# Patient Record
Sex: Female | Born: 1940
Health system: Southern US, Community
[De-identification: ages and names within clinical notes are randomized; demographics above are authoritative.]

## PROBLEM LIST (undated history)

## (undated) ENCOUNTER — Ambulatory Visit: Admission: EM | Payer: Medicare Other | Source: Home / Self Care

## (undated) DIAGNOSIS — K5792 Diverticulitis of intestine, part unspecified, without perforation or abscess without bleeding: Secondary | ICD-10-CM

## (undated) DIAGNOSIS — K6289 Other specified diseases of anus and rectum: Secondary | ICD-10-CM

## (undated) DIAGNOSIS — K559 Vascular disorder of intestine, unspecified: Secondary | ICD-10-CM

## (undated) DIAGNOSIS — K760 Fatty (change of) liver, not elsewhere classified: Secondary | ICD-10-CM

## (undated) DIAGNOSIS — Z9889 Other specified postprocedural states: Secondary | ICD-10-CM

## (undated) DIAGNOSIS — R159 Full incontinence of feces: Secondary | ICD-10-CM

## (undated) DIAGNOSIS — R002 Palpitations: Secondary | ICD-10-CM

## (undated) DIAGNOSIS — T8859XA Other complications of anesthesia, initial encounter: Secondary | ICD-10-CM

## (undated) DIAGNOSIS — R112 Nausea with vomiting, unspecified: Secondary | ICD-10-CM

## (undated) DIAGNOSIS — R55 Syncope and collapse: Secondary | ICD-10-CM

## (undated) DIAGNOSIS — K219 Gastro-esophageal reflux disease without esophagitis: Secondary | ICD-10-CM

## (undated) DIAGNOSIS — E213 Hyperparathyroidism, unspecified: Secondary | ICD-10-CM

## (undated) DIAGNOSIS — I495 Sick sinus syndrome: Secondary | ICD-10-CM

## (undated) DIAGNOSIS — N816 Rectocele: Secondary | ICD-10-CM

## (undated) DIAGNOSIS — R1013 Epigastric pain: Secondary | ICD-10-CM

## (undated) DIAGNOSIS — E785 Hyperlipidemia, unspecified: Secondary | ICD-10-CM

## (undated) DIAGNOSIS — K589 Irritable bowel syndrome without diarrhea: Secondary | ICD-10-CM

## (undated) DIAGNOSIS — I341 Nonrheumatic mitral (valve) prolapse: Secondary | ICD-10-CM

## (undated) DIAGNOSIS — R079 Chest pain, unspecified: Secondary | ICD-10-CM

## (undated) DIAGNOSIS — M8580 Other specified disorders of bone density and structure, unspecified site: Secondary | ICD-10-CM

## (undated) DIAGNOSIS — K5730 Diverticulosis of large intestine without perforation or abscess without bleeding: Secondary | ICD-10-CM

## (undated) DIAGNOSIS — R011 Cardiac murmur, unspecified: Secondary | ICD-10-CM

## (undated) DIAGNOSIS — K5901 Slow transit constipation: Secondary | ICD-10-CM

## (undated) DIAGNOSIS — M1990 Unspecified osteoarthritis, unspecified site: Secondary | ICD-10-CM

## (undated) DIAGNOSIS — I10 Essential (primary) hypertension: Secondary | ICD-10-CM

## (undated) DIAGNOSIS — K635 Polyp of colon: Secondary | ICD-10-CM

## (undated) DIAGNOSIS — C801 Malignant (primary) neoplasm, unspecified: Secondary | ICD-10-CM

## (undated) HISTORY — DX: Diverticulitis of intestine, part unspecified, without perforation or abscess without bleeding: K57.92

## (undated) HISTORY — DX: Gastro-esophageal reflux disease without esophagitis: K21.9

## (undated) HISTORY — DX: Essential (primary) hypertension: I10

## (undated) HISTORY — PX: OTHER SURGICAL HISTORY: SHX169

## (undated) HISTORY — DX: Slow transit constipation: K59.01

## (undated) HISTORY — DX: Irritable bowel syndrome, unspecified: K58.9

## (undated) HISTORY — DX: Epigastric pain: R10.13

## (undated) HISTORY — DX: Unspecified osteoarthritis, unspecified site: M19.90

## (undated) HISTORY — DX: Hyperlipidemia, unspecified: E78.5

## (undated) HISTORY — DX: Cardiac murmur, unspecified: R01.1

## (undated) HISTORY — PX: TONSILLECTOMY: SUR1361

## (undated) HISTORY — DX: Fatty (change of) liver, not elsewhere classified: K76.0

## (undated) HISTORY — PX: BLADDER SUSPENSION: SHX72

## (undated) HISTORY — DX: Full incontinence of feces: R15.9

## (undated) HISTORY — DX: Polyp of colon: K63.5

## (undated) HISTORY — DX: Diverticulosis of large intestine without perforation or abscess without bleeding: K57.30

## (undated) HISTORY — DX: Palpitations: R00.2

## (undated) HISTORY — PX: ABDOMINAL HYSTERECTOMY: SHX81

## (undated) HISTORY — DX: Rectocele: N81.6

## (undated) HISTORY — PX: BUNIONECTOMY: SHX129

## (undated) HISTORY — DX: Other specified disorders of bone density and structure, unspecified site: M85.80

## (undated) HISTORY — PX: CARDIAC CATHETERIZATION: SHX172

## (undated) HISTORY — DX: Syncope and collapse: R55

## (undated) HISTORY — DX: Nonrheumatic mitral (valve) prolapse: I34.1

## (undated) HISTORY — DX: Chest pain, unspecified: R07.9

## (undated) HISTORY — DX: Vascular disorder of intestine, unspecified: K55.9

## (undated) HISTORY — DX: Other specified diseases of anus and rectum: K62.89

## (undated) HISTORY — DX: Sick sinus syndrome: I49.5

## (undated) HISTORY — PX: COLONOSCOPY: SHX5424

## (undated) HISTORY — PX: CATARACT EXTRACTION: SUR2

---

## 1993-07-29 DIAGNOSIS — K635 Polyp of colon: Secondary | ICD-10-CM

## 1993-07-29 HISTORY — DX: Polyp of colon: K63.5

## 1998-07-22 ENCOUNTER — Other Ambulatory Visit: Admission: RE | Admit: 1998-07-22 | Discharge: 1998-07-22 | Payer: Self-pay | Admitting: Gynecology

## 1999-05-04 HISTORY — PX: ABDOMINAL SURGERY: SHX537

## 1999-06-11 ENCOUNTER — Encounter: Payer: Self-pay | Admitting: Gastroenterology

## 1999-06-11 ENCOUNTER — Ambulatory Visit (HOSPITAL_COMMUNITY): Admission: RE | Admit: 1999-06-11 | Discharge: 1999-06-11 | Payer: Self-pay | Admitting: Gastroenterology

## 1999-06-23 ENCOUNTER — Ambulatory Visit (HOSPITAL_COMMUNITY): Admission: RE | Admit: 1999-06-23 | Discharge: 1999-06-23 | Payer: Self-pay | Admitting: Gastroenterology

## 1999-06-23 ENCOUNTER — Encounter: Payer: Self-pay | Admitting: Gastroenterology

## 1999-07-15 ENCOUNTER — Inpatient Hospital Stay (HOSPITAL_COMMUNITY): Admission: RE | Admit: 1999-07-15 | Discharge: 1999-07-19 | Payer: Self-pay | Admitting: General Surgery

## 1999-07-29 ENCOUNTER — Ambulatory Visit (HOSPITAL_COMMUNITY): Admission: RE | Admit: 1999-07-29 | Discharge: 1999-07-29 | Payer: Self-pay | Admitting: Oncology

## 1999-07-29 ENCOUNTER — Encounter: Payer: Self-pay | Admitting: Oncology

## 1999-08-17 ENCOUNTER — Encounter: Payer: Self-pay | Admitting: Oncology

## 1999-08-17 ENCOUNTER — Ambulatory Visit (HOSPITAL_COMMUNITY): Admission: RE | Admit: 1999-08-17 | Discharge: 1999-08-17 | Payer: Self-pay | Admitting: Oncology

## 1999-11-24 ENCOUNTER — Ambulatory Visit (HOSPITAL_BASED_OUTPATIENT_CLINIC_OR_DEPARTMENT_OTHER): Admission: RE | Admit: 1999-11-24 | Discharge: 1999-11-24 | Payer: Self-pay | Admitting: General Surgery

## 1999-12-02 ENCOUNTER — Encounter: Payer: Self-pay | Admitting: General Surgery

## 1999-12-02 ENCOUNTER — Ambulatory Visit (HOSPITAL_COMMUNITY): Admission: RE | Admit: 1999-12-02 | Discharge: 1999-12-02 | Payer: Self-pay | Admitting: General Surgery

## 1999-12-08 ENCOUNTER — Encounter: Payer: Self-pay | Admitting: Oncology

## 1999-12-08 ENCOUNTER — Encounter: Admission: RE | Admit: 1999-12-08 | Discharge: 1999-12-08 | Payer: Self-pay | Admitting: Oncology

## 1999-12-11 ENCOUNTER — Encounter: Payer: Self-pay | Admitting: Surgery

## 1999-12-11 ENCOUNTER — Ambulatory Visit (HOSPITAL_COMMUNITY): Admission: RE | Admit: 1999-12-11 | Discharge: 1999-12-11 | Payer: Self-pay | Admitting: Surgery

## 1999-12-21 ENCOUNTER — Encounter: Payer: Self-pay | Admitting: Oncology

## 1999-12-21 ENCOUNTER — Ambulatory Visit (HOSPITAL_COMMUNITY): Admission: RE | Admit: 1999-12-21 | Discharge: 1999-12-21 | Payer: Self-pay | Admitting: Oncology

## 2000-02-19 ENCOUNTER — Encounter: Payer: Self-pay | Admitting: Oncology

## 2000-02-19 ENCOUNTER — Ambulatory Visit (HOSPITAL_COMMUNITY): Admission: RE | Admit: 2000-02-19 | Discharge: 2000-02-19 | Payer: Self-pay | Admitting: Oncology

## 2000-02-20 ENCOUNTER — Ambulatory Visit (HOSPITAL_COMMUNITY): Admission: RE | Admit: 2000-02-20 | Discharge: 2000-02-20 | Payer: Self-pay | Admitting: Oncology

## 2000-02-20 ENCOUNTER — Encounter: Payer: Self-pay | Admitting: Oncology

## 2000-03-09 ENCOUNTER — Encounter: Payer: Self-pay | Admitting: Oncology

## 2000-03-09 ENCOUNTER — Ambulatory Visit (HOSPITAL_COMMUNITY): Admission: RE | Admit: 2000-03-09 | Discharge: 2000-03-09 | Payer: Self-pay | Admitting: Oncology

## 2000-05-12 ENCOUNTER — Other Ambulatory Visit: Admission: RE | Admit: 2000-05-12 | Discharge: 2000-05-12 | Payer: Self-pay | Admitting: Gynecology

## 2000-05-23 ENCOUNTER — Ambulatory Visit (HOSPITAL_BASED_OUTPATIENT_CLINIC_OR_DEPARTMENT_OTHER): Admission: RE | Admit: 2000-05-23 | Discharge: 2000-05-23 | Payer: Self-pay | Admitting: General Surgery

## 2000-06-27 ENCOUNTER — Encounter: Payer: Self-pay | Admitting: Oncology

## 2000-06-27 ENCOUNTER — Ambulatory Visit (HOSPITAL_COMMUNITY): Admission: RE | Admit: 2000-06-27 | Discharge: 2000-06-27 | Payer: Self-pay | Admitting: Oncology

## 2000-08-24 ENCOUNTER — Encounter: Payer: Self-pay | Admitting: Oncology

## 2000-08-24 ENCOUNTER — Ambulatory Visit (HOSPITAL_COMMUNITY): Admission: RE | Admit: 2000-08-24 | Discharge: 2000-08-24 | Payer: Self-pay | Admitting: Oncology

## 2000-08-25 ENCOUNTER — Encounter: Payer: Self-pay | Admitting: Oncology

## 2000-08-25 ENCOUNTER — Encounter: Admission: RE | Admit: 2000-08-25 | Discharge: 2000-08-25 | Payer: Self-pay | Admitting: Oncology

## 2000-11-29 ENCOUNTER — Encounter: Payer: Self-pay | Admitting: Oncology

## 2000-11-29 ENCOUNTER — Ambulatory Visit (HOSPITAL_COMMUNITY): Admission: RE | Admit: 2000-11-29 | Discharge: 2000-11-29 | Payer: Self-pay | Admitting: Oncology

## 2001-02-27 ENCOUNTER — Ambulatory Visit (HOSPITAL_COMMUNITY): Admission: RE | Admit: 2001-02-27 | Discharge: 2001-02-27 | Payer: Self-pay | Admitting: Oncology

## 2001-02-27 ENCOUNTER — Encounter: Payer: Self-pay | Admitting: Oncology

## 2001-03-20 ENCOUNTER — Other Ambulatory Visit: Admission: RE | Admit: 2001-03-20 | Discharge: 2001-03-20 | Payer: Self-pay | Admitting: Dermatology

## 2001-05-03 HISTORY — PX: CHOLECYSTECTOMY: SHX55

## 2001-05-09 ENCOUNTER — Encounter: Payer: Self-pay | Admitting: Gastroenterology

## 2001-05-09 ENCOUNTER — Ambulatory Visit (HOSPITAL_COMMUNITY): Admission: RE | Admit: 2001-05-09 | Discharge: 2001-05-09 | Payer: Self-pay | Admitting: Gastroenterology

## 2001-05-22 ENCOUNTER — Emergency Department (HOSPITAL_COMMUNITY): Admission: EM | Admit: 2001-05-22 | Discharge: 2001-05-22 | Payer: Self-pay | Admitting: *Deleted

## 2001-05-22 ENCOUNTER — Encounter: Payer: Self-pay | Admitting: *Deleted

## 2001-05-23 ENCOUNTER — Encounter (INDEPENDENT_AMBULATORY_CARE_PROVIDER_SITE_OTHER): Payer: Self-pay | Admitting: *Deleted

## 2001-05-23 ENCOUNTER — Ambulatory Visit (HOSPITAL_COMMUNITY): Admission: RE | Admit: 2001-05-23 | Discharge: 2001-05-24 | Payer: Self-pay | Admitting: General Surgery

## 2001-07-03 ENCOUNTER — Ambulatory Visit (HOSPITAL_COMMUNITY): Admission: RE | Admit: 2001-07-03 | Discharge: 2001-07-03 | Payer: Self-pay | Admitting: Oncology

## 2001-07-03 ENCOUNTER — Encounter: Payer: Self-pay | Admitting: Oncology

## 2001-08-31 ENCOUNTER — Encounter: Payer: Self-pay | Admitting: Oncology

## 2001-08-31 ENCOUNTER — Encounter: Admission: RE | Admit: 2001-08-31 | Discharge: 2001-08-31 | Payer: Self-pay | Admitting: Oncology

## 2001-09-01 ENCOUNTER — Ambulatory Visit (HOSPITAL_COMMUNITY): Admission: RE | Admit: 2001-09-01 | Discharge: 2001-09-01 | Payer: Self-pay | Admitting: Gastroenterology

## 2001-09-01 ENCOUNTER — Encounter: Payer: Self-pay | Admitting: Gastroenterology

## 2001-09-07 ENCOUNTER — Other Ambulatory Visit: Admission: RE | Admit: 2001-09-07 | Discharge: 2001-09-07 | Payer: Self-pay | Admitting: Obstetrics and Gynecology

## 2001-10-24 ENCOUNTER — Encounter: Payer: Self-pay | Admitting: Oncology

## 2001-10-24 ENCOUNTER — Ambulatory Visit (HOSPITAL_COMMUNITY): Admission: RE | Admit: 2001-10-24 | Discharge: 2001-10-24 | Payer: Self-pay | Admitting: Oncology

## 2002-02-06 ENCOUNTER — Ambulatory Visit (HOSPITAL_COMMUNITY): Admission: RE | Admit: 2002-02-06 | Discharge: 2002-02-06 | Payer: Self-pay | Admitting: Family Medicine

## 2002-02-06 ENCOUNTER — Encounter: Payer: Self-pay | Admitting: Family Medicine

## 2002-02-26 ENCOUNTER — Ambulatory Visit (HOSPITAL_COMMUNITY): Admission: RE | Admit: 2002-02-26 | Discharge: 2002-02-26 | Payer: Self-pay | Admitting: Oncology

## 2002-02-26 ENCOUNTER — Encounter: Payer: Self-pay | Admitting: Oncology

## 2002-05-18 ENCOUNTER — Ambulatory Visit (HOSPITAL_COMMUNITY): Admission: RE | Admit: 2002-05-18 | Discharge: 2002-05-18 | Payer: Self-pay | Admitting: Oncology

## 2002-05-18 ENCOUNTER — Encounter: Payer: Self-pay | Admitting: Oncology

## 2002-06-21 ENCOUNTER — Ambulatory Visit (HOSPITAL_COMMUNITY): Admission: RE | Admit: 2002-06-21 | Discharge: 2002-06-21 | Payer: Self-pay | Admitting: Oncology

## 2002-06-21 ENCOUNTER — Encounter: Payer: Self-pay | Admitting: Oncology

## 2002-09-04 ENCOUNTER — Ambulatory Visit (HOSPITAL_COMMUNITY): Admission: RE | Admit: 2002-09-04 | Discharge: 2002-09-04 | Payer: Self-pay | Admitting: Oncology

## 2002-09-04 ENCOUNTER — Encounter: Payer: Self-pay | Admitting: Oncology

## 2002-09-13 ENCOUNTER — Encounter: Admission: RE | Admit: 2002-09-13 | Discharge: 2002-09-13 | Payer: Self-pay | Admitting: Oncology

## 2002-09-13 ENCOUNTER — Encounter: Payer: Self-pay | Admitting: Oncology

## 2002-11-30 ENCOUNTER — Other Ambulatory Visit: Admission: RE | Admit: 2002-11-30 | Discharge: 2002-11-30 | Payer: Self-pay | Admitting: Obstetrics and Gynecology

## 2002-12-28 ENCOUNTER — Encounter: Payer: Self-pay | Admitting: Oncology

## 2002-12-28 ENCOUNTER — Ambulatory Visit (HOSPITAL_COMMUNITY): Admission: RE | Admit: 2002-12-28 | Discharge: 2002-12-28 | Payer: Self-pay | Admitting: Oncology

## 2003-01-24 ENCOUNTER — Ambulatory Visit (HOSPITAL_COMMUNITY): Admission: RE | Admit: 2003-01-24 | Discharge: 2003-01-24 | Payer: Self-pay | Admitting: Cardiology

## 2003-05-02 ENCOUNTER — Ambulatory Visit (HOSPITAL_COMMUNITY): Admission: RE | Admit: 2003-05-02 | Discharge: 2003-05-02 | Payer: Self-pay | Admitting: Oncology

## 2003-09-05 ENCOUNTER — Ambulatory Visit (HOSPITAL_COMMUNITY): Admission: RE | Admit: 2003-09-05 | Discharge: 2003-09-05 | Payer: Self-pay | Admitting: Oncology

## 2003-12-31 ENCOUNTER — Other Ambulatory Visit: Admission: RE | Admit: 2003-12-31 | Discharge: 2003-12-31 | Payer: Self-pay | Admitting: Obstetrics and Gynecology

## 2004-02-20 ENCOUNTER — Ambulatory Visit (HOSPITAL_COMMUNITY): Admission: RE | Admit: 2004-02-20 | Discharge: 2004-02-20 | Payer: Self-pay | Admitting: Oncology

## 2004-05-14 ENCOUNTER — Ambulatory Visit: Payer: Self-pay | Admitting: Cardiology

## 2004-06-17 ENCOUNTER — Ambulatory Visit: Payer: Self-pay | Admitting: Oncology

## 2004-06-19 ENCOUNTER — Ambulatory Visit (HOSPITAL_COMMUNITY): Admission: RE | Admit: 2004-06-19 | Discharge: 2004-06-19 | Payer: Self-pay | Admitting: Oncology

## 2004-11-11 ENCOUNTER — Ambulatory Visit: Payer: Self-pay | Admitting: Gastroenterology

## 2004-12-10 ENCOUNTER — Ambulatory Visit: Payer: Self-pay | Admitting: Oncology

## 2004-12-16 ENCOUNTER — Ambulatory Visit: Payer: Self-pay | Admitting: Gastroenterology

## 2004-12-24 ENCOUNTER — Ambulatory Visit (HOSPITAL_COMMUNITY): Admission: RE | Admit: 2004-12-24 | Discharge: 2004-12-24 | Payer: Self-pay | Admitting: Oncology

## 2005-02-10 ENCOUNTER — Ambulatory Visit: Payer: Self-pay | Admitting: Oncology

## 2005-03-01 ENCOUNTER — Other Ambulatory Visit: Admission: RE | Admit: 2005-03-01 | Discharge: 2005-03-01 | Payer: Self-pay | Admitting: Obstetrics and Gynecology

## 2005-03-19 ENCOUNTER — Encounter: Admission: RE | Admit: 2005-03-19 | Discharge: 2005-03-19 | Payer: Self-pay | Admitting: Obstetrics and Gynecology

## 2005-04-13 ENCOUNTER — Ambulatory Visit: Payer: Self-pay | Admitting: Oncology

## 2005-05-26 ENCOUNTER — Ambulatory Visit: Payer: Self-pay | Admitting: Gastroenterology

## 2005-06-09 ENCOUNTER — Ambulatory Visit: Payer: Self-pay | Admitting: Oncology

## 2005-06-23 ENCOUNTER — Ambulatory Visit: Payer: Self-pay | Admitting: Cardiology

## 2005-06-24 ENCOUNTER — Ambulatory Visit (HOSPITAL_COMMUNITY): Admission: RE | Admit: 2005-06-24 | Discharge: 2005-06-24 | Payer: Self-pay | Admitting: Oncology

## 2005-07-15 ENCOUNTER — Inpatient Hospital Stay (HOSPITAL_COMMUNITY): Admission: RE | Admit: 2005-07-15 | Discharge: 2005-07-17 | Payer: Self-pay | Admitting: Obstetrics and Gynecology

## 2005-10-31 HISTORY — PX: UPPER GASTROINTESTINAL ENDOSCOPY: SHX188

## 2005-11-24 ENCOUNTER — Ambulatory Visit: Payer: Self-pay | Admitting: Gastroenterology

## 2005-11-30 ENCOUNTER — Ambulatory Visit: Payer: Self-pay | Admitting: Gastroenterology

## 2006-01-04 ENCOUNTER — Ambulatory Visit: Payer: Self-pay | Admitting: Oncology

## 2006-01-06 LAB — COMPREHENSIVE METABOLIC PANEL
ALT: 23 U/L (ref 0–40)
AST: 25 U/L (ref 0–37)
Albumin: 4 g/dL (ref 3.5–5.2)
CO2: 28 mEq/L (ref 19–32)
Calcium: 10 mg/dL (ref 8.4–10.5)
Chloride: 105 mEq/L (ref 96–112)
Potassium: 4.2 mEq/L (ref 3.5–5.3)

## 2006-01-06 LAB — CBC WITH DIFFERENTIAL/PLATELET
BASO%: 0.5 % (ref 0.0–2.0)
Eosinophils Absolute: 0.1 10*3/uL (ref 0.0–0.5)
HCT: 36.9 % (ref 34.8–46.6)
LYMPH%: 26.8 % (ref 14.0–48.0)
MCHC: 34.8 g/dL (ref 32.0–36.0)
MONO#: 0.6 10*3/uL (ref 0.1–0.9)
NEUT#: 2.9 10*3/uL (ref 1.5–6.5)
NEUT%: 58.3 % (ref 39.6–76.8)
Platelets: 247 10*3/uL (ref 145–400)
RBC: 4.06 10*6/uL (ref 3.70–5.32)
WBC: 5 10*3/uL (ref 3.9–10.0)
lymph#: 1.4 10*3/uL (ref 0.9–3.3)

## 2006-01-06 LAB — ERYTHROCYTE SEDIMENTATION RATE: Sed Rate: 9 mm/hr (ref 0–30)

## 2006-01-06 LAB — BETA 2 MICROGLOBULIN, SERUM: Beta-2 Microglobulin: 1.68 mg/L (ref 1.01–1.73)

## 2006-01-07 ENCOUNTER — Ambulatory Visit (HOSPITAL_COMMUNITY): Admission: RE | Admit: 2006-01-07 | Discharge: 2006-01-07 | Payer: Self-pay | Admitting: Oncology

## 2006-02-13 ENCOUNTER — Emergency Department (HOSPITAL_COMMUNITY): Admission: EM | Admit: 2006-02-13 | Discharge: 2006-02-13 | Payer: Self-pay | Admitting: Emergency Medicine

## 2006-03-03 ENCOUNTER — Encounter: Admission: RE | Admit: 2006-03-03 | Discharge: 2006-03-03 | Payer: Self-pay | Admitting: Obstetrics and Gynecology

## 2006-06-08 ENCOUNTER — Ambulatory Visit: Payer: Self-pay | Admitting: Cardiology

## 2006-06-20 ENCOUNTER — Ambulatory Visit: Payer: Self-pay | Admitting: Oncology

## 2006-06-23 ENCOUNTER — Ambulatory Visit: Payer: Self-pay

## 2006-06-23 ENCOUNTER — Encounter: Payer: Self-pay | Admitting: Cardiology

## 2006-06-23 LAB — CBC WITH DIFFERENTIAL/PLATELET
Basophils Absolute: 0 10*3/uL (ref 0.0–0.1)
Eosinophils Absolute: 0.2 10*3/uL (ref 0.0–0.5)
HGB: 12.3 g/dL (ref 11.6–15.9)
MCV: 90.5 fL (ref 81.0–101.0)
MONO#: 0.7 10*3/uL (ref 0.1–0.9)
NEUT#: 3.8 10*3/uL (ref 1.5–6.5)
RDW: 13.4 % (ref 11.3–14.5)
WBC: 7.1 10*3/uL (ref 3.9–10.0)
lymph#: 2.3 10*3/uL (ref 0.9–3.3)

## 2006-06-27 LAB — PROTEIN ELECTROPHORESIS, SERUM
Albumin ELP: 60.5 % (ref 55.8–66.1)
Total Protein, Serum Electrophoresis: 6.6 g/dL (ref 6.0–8.3)

## 2006-06-27 LAB — COMPREHENSIVE METABOLIC PANEL
Albumin: 4.1 g/dL (ref 3.5–5.2)
BUN: 24 mg/dL — ABNORMAL HIGH (ref 6–23)
CO2: 27 mEq/L (ref 19–32)
Calcium: 10.1 mg/dL (ref 8.4–10.5)
Chloride: 106 mEq/L (ref 96–112)
Glucose, Bld: 103 mg/dL — ABNORMAL HIGH (ref 70–99)
Potassium: 4 mEq/L (ref 3.5–5.3)
Total Protein: 6.6 g/dL (ref 6.0–8.3)

## 2006-06-30 ENCOUNTER — Ambulatory Visit (HOSPITAL_COMMUNITY): Admission: RE | Admit: 2006-06-30 | Discharge: 2006-06-30 | Payer: Self-pay | Admitting: Oncology

## 2006-08-31 ENCOUNTER — Ambulatory Visit: Payer: Self-pay | Admitting: Gastroenterology

## 2006-09-15 ENCOUNTER — Ambulatory Visit (HOSPITAL_COMMUNITY): Admission: RE | Admit: 2006-09-15 | Discharge: 2006-09-15 | Payer: Self-pay | Admitting: Family Medicine

## 2007-01-11 ENCOUNTER — Ambulatory Visit: Payer: Self-pay | Admitting: Oncology

## 2007-01-12 LAB — CBC WITH DIFFERENTIAL/PLATELET
BASO%: 0.4 % (ref 0.0–2.0)
Eosinophils Absolute: 0.1 10*3/uL (ref 0.0–0.5)
LYMPH%: 28.5 % (ref 14.0–48.0)
MCHC: 35.5 g/dL (ref 32.0–36.0)
MCV: 89 fL (ref 81.0–101.0)
MONO#: 0.6 10*3/uL (ref 0.1–0.9)
MONO%: 11 % (ref 0.0–13.0)
NEUT#: 3.3 10*3/uL (ref 1.5–6.5)
RBC: 4.03 10*6/uL (ref 3.70–5.32)
RDW: 12.7 % (ref 11.3–14.5)
WBC: 5.6 10*3/uL (ref 3.9–10.0)

## 2007-01-12 LAB — LACTATE DEHYDROGENASE: LDH: 150 U/L (ref 94–250)

## 2007-01-12 LAB — COMPREHENSIVE METABOLIC PANEL
CO2: 26 mEq/L (ref 19–32)
Calcium: 9.8 mg/dL (ref 8.4–10.5)
Creatinine, Ser: 0.67 mg/dL (ref 0.40–1.20)
Glucose, Bld: 93 mg/dL (ref 70–99)
Sodium: 142 mEq/L (ref 135–145)
Total Bilirubin: 0.3 mg/dL (ref 0.3–1.2)
Total Protein: 6.6 g/dL (ref 6.0–8.3)

## 2007-01-13 ENCOUNTER — Ambulatory Visit (HOSPITAL_COMMUNITY): Admission: RE | Admit: 2007-01-13 | Discharge: 2007-01-13 | Payer: Self-pay | Admitting: Oncology

## 2007-03-17 ENCOUNTER — Ambulatory Visit: Payer: Self-pay | Admitting: Internal Medicine

## 2007-04-07 ENCOUNTER — Ambulatory Visit (HOSPITAL_COMMUNITY): Admission: RE | Admit: 2007-04-07 | Discharge: 2007-04-07 | Payer: Self-pay | Admitting: Family Medicine

## 2007-04-19 ENCOUNTER — Encounter: Admission: RE | Admit: 2007-04-19 | Discharge: 2007-04-19 | Payer: Self-pay | Admitting: Obstetrics and Gynecology

## 2007-06-14 ENCOUNTER — Encounter: Payer: Self-pay | Admitting: Cardiology

## 2007-06-16 DIAGNOSIS — K559 Vascular disorder of intestine, unspecified: Secondary | ICD-10-CM | POA: Insufficient documentation

## 2007-06-16 DIAGNOSIS — Z8719 Personal history of other diseases of the digestive system: Secondary | ICD-10-CM

## 2007-06-16 DIAGNOSIS — R159 Full incontinence of feces: Secondary | ICD-10-CM | POA: Insufficient documentation

## 2007-06-16 DIAGNOSIS — K5730 Diverticulosis of large intestine without perforation or abscess without bleeding: Secondary | ICD-10-CM | POA: Insufficient documentation

## 2007-06-16 DIAGNOSIS — M949 Disorder of cartilage, unspecified: Secondary | ICD-10-CM | POA: Insufficient documentation

## 2007-06-16 DIAGNOSIS — C8590 Non-Hodgkin lymphoma, unspecified, unspecified site: Secondary | ICD-10-CM | POA: Insufficient documentation

## 2007-06-16 DIAGNOSIS — K449 Diaphragmatic hernia without obstruction or gangrene: Secondary | ICD-10-CM | POA: Insufficient documentation

## 2007-06-16 DIAGNOSIS — M899 Disorder of bone, unspecified: Secondary | ICD-10-CM | POA: Insufficient documentation

## 2007-06-16 DIAGNOSIS — R55 Syncope and collapse: Secondary | ICD-10-CM | POA: Insufficient documentation

## 2007-06-21 ENCOUNTER — Ambulatory Visit: Payer: Self-pay | Admitting: Cardiology

## 2007-08-08 ENCOUNTER — Ambulatory Visit: Payer: Self-pay | Admitting: Oncology

## 2007-08-10 LAB — CBC WITH DIFFERENTIAL/PLATELET
BASO%: 0.2 % (ref 0.0–2.0)
Basophils Absolute: 0 10*3/uL (ref 0.0–0.1)
Eosinophils Absolute: 0.1 10*3/uL (ref 0.0–0.5)
HCT: 35.9 % (ref 34.8–46.6)
HGB: 12.6 g/dL (ref 11.6–15.9)
MONO#: 0.6 10*3/uL (ref 0.1–0.9)
NEUT#: 2.7 10*3/uL (ref 1.5–6.5)
NEUT%: 53.5 % (ref 39.6–76.8)
WBC: 5.1 10*3/uL (ref 3.9–10.0)
lymph#: 1.6 10*3/uL (ref 0.9–3.3)

## 2007-08-10 LAB — COMPREHENSIVE METABOLIC PANEL
ALT: 17 U/L (ref 0–35)
BUN: 17 mg/dL (ref 6–23)
CO2: 26 mEq/L (ref 19–32)
Calcium: 9.7 mg/dL (ref 8.4–10.5)
Chloride: 104 mEq/L (ref 96–112)
Creatinine, Ser: 0.72 mg/dL (ref 0.40–1.20)

## 2007-08-10 LAB — LACTATE DEHYDROGENASE: LDH: 137 U/L (ref 94–250)

## 2007-08-11 ENCOUNTER — Ambulatory Visit (HOSPITAL_COMMUNITY): Admission: RE | Admit: 2007-08-11 | Discharge: 2007-08-11 | Payer: Self-pay | Admitting: Oncology

## 2007-08-11 ENCOUNTER — Encounter: Payer: Self-pay | Admitting: Internal Medicine

## 2007-08-28 ENCOUNTER — Ambulatory Visit (HOSPITAL_COMMUNITY): Admission: RE | Admit: 2007-08-28 | Discharge: 2007-08-28 | Payer: Self-pay | Admitting: Oncology

## 2007-08-28 ENCOUNTER — Encounter: Payer: Self-pay | Admitting: Internal Medicine

## 2007-09-04 ENCOUNTER — Encounter: Payer: Self-pay | Admitting: Internal Medicine

## 2007-10-13 ENCOUNTER — Ambulatory Visit: Payer: Self-pay | Admitting: Internal Medicine

## 2007-10-13 DIAGNOSIS — K589 Irritable bowel syndrome without diarrhea: Secondary | ICD-10-CM

## 2007-10-13 DIAGNOSIS — K3189 Other diseases of stomach and duodenum: Secondary | ICD-10-CM | POA: Insufficient documentation

## 2007-10-13 DIAGNOSIS — R1013 Epigastric pain: Secondary | ICD-10-CM

## 2007-11-17 ENCOUNTER — Ambulatory Visit: Payer: Self-pay | Admitting: Internal Medicine

## 2007-11-17 ENCOUNTER — Encounter: Payer: Self-pay | Admitting: Internal Medicine

## 2007-11-17 ENCOUNTER — Telehealth: Payer: Self-pay | Admitting: Internal Medicine

## 2007-11-23 ENCOUNTER — Encounter: Payer: Self-pay | Admitting: Internal Medicine

## 2008-02-27 ENCOUNTER — Telehealth: Payer: Self-pay | Admitting: Internal Medicine

## 2008-05-16 ENCOUNTER — Encounter: Admission: RE | Admit: 2008-05-16 | Discharge: 2008-05-16 | Payer: Self-pay | Admitting: Oncology

## 2008-08-20 ENCOUNTER — Ambulatory Visit: Payer: Self-pay | Admitting: Oncology

## 2008-08-22 ENCOUNTER — Ambulatory Visit (HOSPITAL_COMMUNITY): Admission: RE | Admit: 2008-08-22 | Discharge: 2008-08-22 | Payer: Self-pay | Admitting: Oncology

## 2008-08-22 LAB — COMPREHENSIVE METABOLIC PANEL
Alkaline Phosphatase: 57 U/L (ref 39–117)
BUN: 13 mg/dL (ref 6–23)
Glucose, Bld: 106 mg/dL — ABNORMAL HIGH (ref 70–99)
Sodium: 141 mEq/L (ref 135–145)
Total Bilirubin: 0.6 mg/dL (ref 0.3–1.2)

## 2008-08-22 LAB — CBC WITH DIFFERENTIAL/PLATELET
BASO%: 0.5 % (ref 0.0–2.0)
EOS%: 1.3 % (ref 0.0–7.0)
LYMPH%: 38.1 % (ref 14.0–49.7)
MCH: 32 pg (ref 25.1–34.0)
MCHC: 34.5 g/dL (ref 31.5–36.0)
MCV: 92.8 fL (ref 79.5–101.0)
MONO#: 0.6 10*3/uL (ref 0.1–0.9)
MONO%: 12 % (ref 0.0–14.0)
Platelets: 244 10*3/uL (ref 145–400)
RBC: 4.16 10*6/uL (ref 3.70–5.45)
WBC: 4.7 10*3/uL (ref 3.9–10.3)

## 2008-08-22 LAB — LACTATE DEHYDROGENASE: LDH: 151 U/L (ref 94–250)

## 2008-08-23 LAB — VITAMIN D 25 HYDROXY (VIT D DEFICIENCY, FRACTURES): Vit D, 25-Hydroxy: 36 ng/mL (ref 30–89)

## 2008-08-27 DIAGNOSIS — I341 Nonrheumatic mitral (valve) prolapse: Secondary | ICD-10-CM | POA: Insufficient documentation

## 2008-08-27 DIAGNOSIS — I495 Sick sinus syndrome: Secondary | ICD-10-CM

## 2008-08-27 DIAGNOSIS — R002 Palpitations: Secondary | ICD-10-CM

## 2008-08-27 DIAGNOSIS — E785 Hyperlipidemia, unspecified: Secondary | ICD-10-CM

## 2008-08-29 ENCOUNTER — Ambulatory Visit: Payer: Self-pay | Admitting: Cardiology

## 2008-09-12 ENCOUNTER — Encounter: Payer: Self-pay | Admitting: Cardiology

## 2008-09-12 ENCOUNTER — Ambulatory Visit: Payer: Self-pay

## 2009-05-29 ENCOUNTER — Encounter: Admission: RE | Admit: 2009-05-29 | Discharge: 2009-05-29 | Payer: Self-pay | Admitting: Oncology

## 2009-08-01 ENCOUNTER — Ambulatory Visit: Payer: Self-pay | Admitting: Cardiology

## 2009-08-19 ENCOUNTER — Ambulatory Visit: Payer: Self-pay | Admitting: Oncology

## 2009-08-21 ENCOUNTER — Ambulatory Visit (HOSPITAL_COMMUNITY): Admission: RE | Admit: 2009-08-21 | Discharge: 2009-08-21 | Payer: Self-pay | Admitting: Oncology

## 2009-08-21 ENCOUNTER — Encounter: Payer: Self-pay | Admitting: Internal Medicine

## 2009-08-21 LAB — CBC WITH DIFFERENTIAL/PLATELET
BASO%: 0.3 % (ref 0.0–2.0)
Basophils Absolute: 0 10e3/uL (ref 0.0–0.1)
EOS%: 1.7 % (ref 0.0–7.0)
Eosinophils Absolute: 0.1 10e3/uL (ref 0.0–0.5)
HCT: 38.6 % (ref 34.8–46.6)
HGB: 13.1 g/dL (ref 11.6–15.9)
LYMPH%: 36.4 % (ref 14.0–49.7)
MCH: 31.7 pg (ref 25.1–34.0)
MCHC: 34 g/dL (ref 31.5–36.0)
MCV: 93.3 fL (ref 79.5–101.0)
MONO#: 0.6 10e3/uL (ref 0.1–0.9)
MONO%: 11.5 % (ref 0.0–14.0)
NEUT#: 2.7 10e3/uL (ref 1.5–6.5)
NEUT%: 50.1 % (ref 38.4–76.8)
Platelets: 217 10e3/uL (ref 145–400)
RBC: 4.14 10e6/uL (ref 3.70–5.45)
RDW: 12.9 % (ref 11.2–14.5)
WBC: 5.5 10e3/uL (ref 3.9–10.3)
lymph#: 2 10e3/uL (ref 0.9–3.3)

## 2009-08-21 LAB — COMPREHENSIVE METABOLIC PANEL WITH GFR
ALT: 19 U/L (ref 0–35)
AST: 24 U/L (ref 0–37)
Albumin: 3.9 g/dL (ref 3.5–5.2)
Alkaline Phosphatase: 70 U/L (ref 39–117)
BUN: 11 mg/dL (ref 6–23)
CO2: 29 meq/L (ref 19–32)
Calcium: 9.7 mg/dL (ref 8.4–10.5)
Chloride: 106 meq/L (ref 96–112)
Creatinine, Ser: 0.75 mg/dL (ref 0.40–1.20)
Glucose, Bld: 94 mg/dL (ref 70–99)
Potassium: 4.2 meq/L (ref 3.5–5.3)
Sodium: 141 meq/L (ref 135–145)
Total Bilirubin: 0.6 mg/dL (ref 0.3–1.2)
Total Protein: 6.4 g/dL (ref 6.0–8.3)

## 2009-08-21 LAB — CANCER ANTIGEN 27.29: CA 27.29: 21 U/mL (ref 0–39)

## 2009-08-28 ENCOUNTER — Encounter: Payer: Self-pay | Admitting: Internal Medicine

## 2009-10-02 ENCOUNTER — Ambulatory Visit: Payer: Self-pay | Admitting: Internal Medicine

## 2010-04-14 ENCOUNTER — Ambulatory Visit: Payer: Self-pay | Admitting: Internal Medicine

## 2010-05-22 ENCOUNTER — Other Ambulatory Visit: Payer: Self-pay | Admitting: Oncology

## 2010-05-22 DIAGNOSIS — C8290 Follicular lymphoma, unspecified, unspecified site: Secondary | ICD-10-CM

## 2010-05-23 ENCOUNTER — Encounter: Payer: Self-pay | Admitting: Oncology

## 2010-05-24 ENCOUNTER — Encounter: Payer: Self-pay | Admitting: Oncology

## 2010-05-31 LAB — CONVERTED CEMR LAB
BUN: 17 mg/dL (ref 6–23)
CO2: 30 meq/L (ref 19–32)
Chloride: 108 meq/L (ref 96–112)
Glucose, Bld: 108 mg/dL — ABNORMAL HIGH (ref 70–99)
Potassium: 4.2 meq/L (ref 3.5–5.1)

## 2010-06-02 NOTE — Letter (Signed)
[facility]   Imported By: Lester [place] 10/02/2009 09:24:50  _____________________________________________________________________  External Attachment:    Type:   Image     Comment:   External Document

## 2010-06-02 NOTE — Assessment & Plan Note (Signed)
Summary: FOLLOW UP IBS    History of Present Illness Visit Type: Follow-up Visit Primary GI MD: Stan Head MD [facility] Primary Provider: Lubertha South, MD Requesting Provider: Lubertha South, MD Chief Complaint: IBS and possible recurrent Rectocele History of Present Illness:   70 yo white woman here for follow-up. Last seen at 2009 colonoscopy (diverticulosis, IBS response, normal bxs).  She saw Dr. Henderson Cloud and he detected a small rectocele. No major problem with it but senses a bulge in posterior vagina. She questined if she has hemorhoids due to a sense that there is somethng outside of her rectum for some time post-defecation. Dr. Henderson Cloud did not identify hemorhoids. She does not understand why she cramps so badly with defecation. She has one stool without difficulty then she develops cramps and a smaller, thin stool, then a softer stool will come. This takes 20 minutes and the cramps which are quite painful, do not remt until last (third stool). These spells occur abut once a week. She also has seepage of mucous into underwear at times and also cramps and pases it into commode. 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 Surveillance CT and PET scan were negative April 2011. She was unable to urinate in the past when she tried Robinul.   GI Review of Systems      Denies abdominal pain, acid reflux, belching, bloating, chest pain, dysphagia with liquids, dysphagia with solids, heartburn, loss of appetite, nausea, vomiting, vomiting blood, weight loss, and  weight gain.      Reports change in bowel habits, constipation, and  diarrhea.     Denies anal fissure, black tarry stools, diverticulosis, fecal incontinence, heme positive stool, hemorrhoids, irritable bowel syndrome, jaundice, light color stool, liver problems, rectal bleeding, and  rectal  pain. Clinical Reports Reviewed:  Colonoscopy:  11/17/2007:  Location:  [facility].   11/17/2007:  Results: Diverticulosis.       Location:  [facility].   Comments: 1) SIGMOID DIVERTICULOSIS 2) OTHERWISE NORMAL COLONOSCOPY WITH LOOK AT VERY DISTAL TERMINAL ILEUM, RANDOM BIOPSIES TAKEN 3) STRONG IBS RESPONSE TO SCOPE IN SIGMOID 4) GOOD PREP ***MICROSCOPIC EXAMINATION AND DIAGNOSIS***    COLON, RANDOM BIOPSY: BENIGN COLONIC MUCOSA. NO SIGNIFICANT  INFLAMMATION OR OTHER ABNORMALITIES IDENTIFIED.  11/30/2005:  Location:  [facility].  Incomplete Exam     Current Medications (verified): 1)  Nexium 40 Mg  Cpdr (Esomeprazole Magnesium) .Marland Kitchen.. 1 Capsule Each Day 30 Minutes Before Meal 2)  Evista 60 Mg  Tabs (Raloxifene Hcl) .... One By Mouth Once Daily 3)  Atenolol 25 Mg  Tabs (Atenolol) .... One By Mouth Once Daily 4)  Multivitamins   Tabs (Multiple Vitamin) .... One By Mouth Once Daily 5)  Calcium 600 600 Mg  Tabs (Calcium Carbonate) .... Two By Mouth Once Daily 6)  Fish Oil 1000 Mg  Caps (Omega-3 Fatty Acids) .... One By Mouth Once Daily 7)  Aspirin 81 Mg Tbec (Aspirin) .... Take One Tablet By Mouth Daily 8)  Vitamin D 400 Unit Caps (Cholecalciferol) .Marland Kitchen.. 1 By Mouth Once Daily  Allergies (verified): 1)  ! Robinul (Glycopyrrolate) 2)  ! Iodine 3)  ! Sulfa 4)  ! 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 pelvic prolapse repair hysterectomy 1981 bladder suspension bunionectomy abdominal lymph node removal Cholecystectomy 2003  Family History: Reviewed history from 10/13/2007 and no changes required. Family History of Colon Cancer:father Family History of Prostate Cancer:brother Long family Hx in both side of her family Family History of Colon Polyps: brothers x2   Social History: Reviewed history from 10/13/2007 and no changes required. Patient is a former smoker. quit 35 years ago Occupation: retired Alcohol Use - no Daily Caffeine Use Illicit Drug Use - no Patient gets regular exercise.  Vital Signs:  Patient profile:   70 year old female Height:      63 inches Weight:      140.50 pounds BMI:     24.98 Pulse rate:   60 / minute Pulse rhythm:   irregular BP sitting:   146 / 88  (left arm)  Vitals Entered By: Milford Cage NCMA (October 02, 2009 1:44 PM)  Serial Vital Signs/Assessments:  Time      Position  BP       Pulse  Resp  Temp     By                     136/86                         Milford Cage NCMA  Comments: Rt. arm. By: Milford Cage NCMA    Physical Exam  General:  Well developed, well nourished, no acute distress. Lungs:  Clear throughout to auscultation. Heart:  Regular rate and rhythm; no murmurs, rubs,  or bruits. Abdomen:  soft and nontender, mildly distended, no HSM or mas BS+ Psych:  Alert and cooperative. Normal mood and affect.   Impression & Recommendations:  Problem # 1:  I B S-DIARRHEAL PREDOMINANT (ICD-564.1) Assessment Unchanged really not changed over time. Her prior lymphoma in the mesentery concerns her, in light of these symptoms she becomes afraid they are from recurrent lymphoma. she is ressured today, recent PET negative. Trial of hyoscyamne 0.125 mg as needed to help with pain.  Problem # 2:  DYSPEPSIA (ICD-536.8) Assessment:  Unchanged controlled on PPI and will continue  Patient Instructions: 1)  Please pick up your medications at your pharmacy.  2)  IBS brochure given. 3)  If you can identify any food triggers, try to avoid those foods. 4)  Please schedule a follow-up appointment as needed. 5)  Copy Sent To: Loran Senters, MD, Pierce Crane, MD, Hulan Fess, MD 6)  The medication list was reviewed and reconciled.  All changed / newly prescribed medications were explained.  A complete medication list was provided to the patient / caregiver.  7)  The medication list was reviewed and reconciled.  All changed / newly prescribed medications were explained.  A complete medication list was provided to the patient / caregiver.  Prescriptions: LEVSIN/SL 0.125 MG SUBL (HYOSCYAMINE SULFATE) 1-2 sublingual every 4 hours as needed for abdominal pain  #60 x 5   Entered and Authorized by:   Iva Boop MD, [facility]   Signed by:   Iva Boop MD, [facility] on 10/02/2009   Method used:   Electronically to  [facility]  [id number] (retail)       [address]       [place], [place]  [address]       Ph: [phone] or [phone]       Fax: [phone]   RxID:   [id number]

## 2010-06-02 NOTE — Assessment & Plan Note (Signed)
Summary: 1 yr/dmp  Medications Added ASPIRIN 81 MG TBEC (ASPIRIN) Take one tablet by mouth daily      Allergies Added:   Visit Type:  Follow-up  CC:  no complaints.  Current Medications (verified): 1)  Nexium 40 Mg  Cpdr (Esomeprazole Magnesium) .Marland Kitchen.. 1 Capsule Each Day 30 Minutes Before Meal 2)  Evista 60 Mg  Tabs (Raloxifene Hcl) .... One By Mouth Once Daily 3)  Atenolol 25 Mg  Tabs (Atenolol) .... One By Mouth Once Daily 4)  Multivitamins   Tabs (Multiple Vitamin) .... One By Mouth Once Daily 5)  Calcium 600 600 Mg  Tabs (Calcium Carbonate) .... Two By Mouth Once Daily 6)  Fish Oil 1000 Mg  Caps (Omega-3 Fatty Acids) .... One By Mouth Once Daily 7)  Aspirin 81 Mg Tbec (Aspirin) .... Take One Tablet By Mouth Daily  Allergies (verified): 1)  ! * Robinul 2)  ! * Pamine Forte 3)  ! Iodine 4)  ! Sulfa 5)  ! Codeine  Vital Signs:  Patient profile:   70 year old female Height:      63 inches Weight:      141 pounds BMI:     25.07 Pulse rate:   81 / minute BP sitting:   110 / 58  (left arm) Cuff size:   large  Vitals Entered By: [name] [name], RMA (August 01, 2009 12:52 PM)   Other Orders: EKG w/ Interpretation (93000)  Patient Instructions: 1)  Your physician recommends that you schedule a follow-up appointment in: YEAR WITH DR Joshue Badal 2)  Your physician recommends that you continue on your current medications as directed. Please refer to the Current Medication list given to you today.  Appended Document: [facility]      Visit Type:  Follow-up Primary Provider:  Lubertha South, MD   History of Present Illness: [name] [name] comes in today for evaluation of her mitral valve prolapse.  She's having very few symptoms. She cases of sharp stabbing pain is transient in nature. It does not radiate. It is not associated with exertion. She has not had any palpitations.  Her last 2-D echocardiogram was last year. This showed mild mitral valve prolapse with mild  mitral regurgitation.  Allergies: 1)  ! * Robinul 2)  ! * Pamine Forte 3)  ! Iodine 4)  ! Sulfa 5)  ! 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) MITRAL VALVE PROLAPSE (ICD-424.0) HYPERLIPIDEMIA-MIXED (ICD-272.4) PALPITATIONS (ICD-785.1) DYSPEPSIA (ICD-536.8) VASOVAGAL SYNCOPE (ICD-780.2) CARCINOMA, COLON, FAMILY HX (ICD-V16.0) DIARRHEA (ICD-787.91) HIATAL HERNIA (ICD-553.3) ISCHEMIC COLITIS (ICD-557.9) FATTY LIVER DISEASE, HX OF (ICD-V12.79) OSTEOPENIA (ICD-733.90) NON-HODGKIN'S LYMPHOMA, HX OF (ICD-V10.79) DIVERTICULOSIS OF COLON (ICD-562.10) I B S-DIARRHEAL PREDOMINATE (ICD-564.1) INCONTINENCE-FECAL (ICD-787.6)      Past Surgical History: Last updated: 10/13/2007 pelvic prolapse repair hysterectomy 1981 bladder suspension bunionectomy abdominal lymph node removal Cholecystectomy 2003  Family History: Last updated: 10/13/2007 Family History of Colon Cancer:father Family History of Prostate Cancer:brother Long family Hx in both side of her family Family History of Colon Polyps: brothers x2   Social History: Last updated: 10/13/2007 Patient is a former smoker. quit 35 years ago Occupation: retired Alcohol Use - no Daily Caffeine Use Illicit Drug Use - no Patient gets regular exercise.  Risk Factors: Caffeine Use: 1 (10/13/2007) Exercise: yes (10/13/2007)  Risk Factors: Smoking Status: quit (10/13/2007)  Review of Systems       negative other than history of present illness  Physical Exam  General:  Well developed, well nourished, in no acute distress. Head:  normocephalic and atraumatic Eyes:  PERRLA/EOM intact; conjunctiva and lids normal. Neck:  Neck supple, no JVD. No masses, thyromegaly or abnormal cervical nodes. Chest [name] [name]:  no deformities or breast masses noted Lungs:  Clear bilaterally to auscultation and percussion. Heart:  regular rate and rhythm, nondisplaced  PMI, normal S1-S2, no obvious click, trivial murmur Abdomen:  Bowel sounds positive; abdomen soft and non-tender without masses, organomegaly, or hernias noted. No hepatosplenomegaly. Msk:  Back normal, normal gait. Muscle strength and tone normal. Pulses:  pulses normal in all 4 extremities Extremities:  No clubbing or cyanosis. Neurologic:  Alert and oriented x 3. Skin:  Intact without lesions or rashes. Psych:  Normal affect.   Impression & Recommendations:  Problem # 1:  MITRAL VALVE PROLAPSE (ICD-424.0) Assessment Unchanged  Her updated medication list for this problem includes:    Atenolol 25 Mg Tabs (Atenolol) ..... One by mouth once daily  Problem # 2:  PALPITATIONS (ICD-785.1) Assessment: Improved  Her updated medication list for this problem includes:    Atenolol 25 Mg Tabs (Atenolol) ..... One by mouth once daily    Aspirin 81 Mg Tbec (Aspirin) .Marland Kitchen... Take one tablet by mouth daily  Problem # 3:  VASOVAGAL SYNCOPE (ICD-780.2) Assessment: Improved  Her updated medication list for this problem includes:    Atenolol 25 Mg Tabs (Atenolol) ..... One by mouth once daily    Aspirin 81 Mg Tbec (Aspirin) .Marland Kitchen... Take one tablet by mouth daily

## 2010-06-04 NOTE — Assessment & Plan Note (Signed)
Summary: problems w/abd--ch.    History of Present Illness Visit Type: Follow-up Visit Primary GI MD: Stan Head MD [facility] Primary Provider: Lubertha South, MD Requesting Provider: Lubertha South, MD Chief Complaint: abdominal cramping, some leakage of stool,constipation alternating with diarrhea History of Present Illness:   70 yo ww with IBS here for follow-up. Last seen in June 2011. She took stool softeners daily for a while but that mad her chronic fecal seepage worse. She was having scyballous, ball-like stools. == Incomplete defecation. Hyoscyamine has helped the severe cramping with stools. Metamucil was tried in the past but she remembers increased gas and fecal seepage. No gas or bloating. Do you eat alot of fber? "I eat alot of salads"       GI Review of Systems    Reports abdominal pain.     Location of  Abdominal pain: lower abdomen.    Denies acid reflux, belching, bloating, chest pain, dysphagia with liquids, dysphagia with solids, heartburn, loss of appetite, nausea, vomiting, vomiting blood, weight loss, and  weight gain.      Reports constipation, diarrhea, and  fecal incontinence.     Denies anal fissure, black tarry stools, change in bowel habit, diverticulosis, heme positive stool, hemorrhoids, irritable bowel syndrome, jaundice, light color stool, liver problems, rectal bleeding, and  rectal pain.    Current Medications (verified): 1)  Nexium 40 Mg  Cpdr (Esomeprazole Magnesium) .Marland Kitchen.. 1 Capsule Each Day 30 Minutes Before Meal 2)  Atenolol 25 Mg  Tabs (Atenolol) .... One By Mouth Once Daily 3)  Multivitamins   Tabs (Multiple Vitamin) .... One By Mouth Once Daily 4)  Calcium 600 600 Mg  Tabs (Calcium Carbonate) .... Two By Mouth Once Daily 5)  Fish Oil 1000 Mg  Caps (Omega-3 Fatty Acids) .... One By Mouth Once Daily 6)  Aspirin 81 Mg Tbec (Aspirin) .... Take One Tablet By Mouth Daily 7)  Vitamin D 400 Unit Caps (Cholecalciferol) .Marland Kitchen.. 1 By Mouth Once Daily 8)   Levsin/sl 0.125 Mg Subl (Hyoscyamine Sulfate) .Marland Kitchen.. 1-2 Sublingual Every 4 Hours As Needed For Abdominal Pain 9)  Estrace 0.1 Mg/gm Crea (Estradiol) .... Apply Every Other Day  Allergies (verified): 1)  ! Robinul (Glycopyrrolate) 2)  ! Iodine 3)  ! Sulfa 4)  ! Codeine 5)  Pamine Forte (Methscopolamine Bromide)  Past History:  Past Medical History: Reviewed history from 10/02/2009 and no changes required. 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 pelvic prolapse repair hysterectomy 1981 bladder suspension bunionectomy abdominal lymph node removal Cholecystectomy 2003  Family History: Reviewed history from 10/13/2007 and no changes required. Family History of Colon Cancer:father Family History of Prostate Cancer:brother Long family Hx in both side of her family Family History of Colon Polyps: brothers x2   Social History: Reviewed history from 10/13/2007 and no changes required. Patient is a former smoker. quit 35 years ago Occupation: retired Alcohol Use - no Daily Caffeine Use Illicit Drug Use - no Patient gets regular exercise.  Review of Systems       She has urinary frequency, sometimes hard to empty completerly. + nocturia x 2.  Vital Signs:  Patient profile:   70 year old female  Height:      63 inches Weight:      141.38 pounds BMI:     25.13 Pulse rate:   60 / minute Pulse rhythm:   regular BP sitting:   100 / 66  (right arm) Cuff size:    regular  Vitals Entered By: June McMurray CMA Duncan Dull) (May 28, 2010 8:23 AM)  Physical Exam  General:  Well developed, well nourished, no acute distress. Abdomen:  soft and nontender, mildly distended, no HSM or mass BS+ Rectal:  female staff present: normal anoderm no anal wink  normal to slightly decreased resting tone good voluntary squeeze, normal valsalva response, no abnormal descent very small rectocele formed brown stool (firm) no masses ANOSCOPY: normal anal canal Psych:  Alert and cooperative. Normal mood and affect.   Impression & Recommendations:  Problem # 1:  INCONTINENCE-FECAL (ICD-787.6) Assessment Unchanged Chronic seepage for years. Will try gradual increase in Benefiber as per instructions. She did not have hemorrhoids on anoscopy but perhaps hydrocortisone suppository trial is worth it fiver does not help. Sphincter tone seems adequate.  Problem # 2:  I B S-DIARRHEAL PREDOMINANT (ICD-564.1) Assessment: Unchanged hyoscyamine is helping cramps (uses every few weeks) but overall about the same with classic multiple stools with mixed pattern of consistency and some urgent defecation.  Patient Instructions: 1)  Start Benefiber 1 teaspoon in liquid each day for 1 week then increase to 2 teaspoons daily for another week and then get to 3 teaspoons (1 tablespoon) daily. usually best to take at night but not absolutely necessary. If this is not releiving seepage and promoting more regular bowel movements, then you can increase to 2 tablespoons a day in same fashion (may take twice a day).  2)  If still not helpful after 4- 6 weeks.call us back. 3)  Copy sent to : Dr. Loran Senters, Dr. Pierce Crane, Dr. Harold Hedge 4)  The medication list was reviewed and reconciled.  All changed / newly prescribed medications were explained.  A complete medication list was provided to the patient / caregiver.

## 2010-07-07 ENCOUNTER — Other Ambulatory Visit: Payer: Self-pay | Admitting: Oncology

## 2010-07-07 DIAGNOSIS — Z1231 Encounter for screening mammogram for malignant neoplasm of breast: Secondary | ICD-10-CM

## 2010-07-23 ENCOUNTER — Other Ambulatory Visit: Payer: Self-pay | Admitting: *Deleted

## 2010-07-23 DIAGNOSIS — I10 Essential (primary) hypertension: Secondary | ICD-10-CM

## 2010-07-23 MED ORDER — ATENOLOL 25 MG PO TABS: 25.0000 mg | ORAL_TABLET | Freq: Every day | ORAL | Status: DC

## 2010-07-31 ENCOUNTER — Ambulatory Visit
Admission: RE | Admit: 2010-07-31 | Discharge: 2010-07-31 | Disposition: A | Discharge status: Home / Self Care | Payer: Medicare Other | Source: Ambulatory Visit | Attending: Oncology | Admitting: Oncology

## 2010-07-31 DIAGNOSIS — Z1231 Encounter for screening mammogram for malignant neoplasm of breast: Secondary | ICD-10-CM

## 2010-08-26 ENCOUNTER — Encounter (HOSPITAL_BASED_OUTPATIENT_CLINIC_OR_DEPARTMENT_OTHER): Payer: Medicare Other | Admitting: Oncology

## 2010-08-26 ENCOUNTER — Other Ambulatory Visit: Payer: Self-pay | Admitting: Oncology

## 2010-08-26 DIAGNOSIS — C8589 Other specified types of non-Hodgkin lymphoma, extranodal and solid organ sites: Secondary | ICD-10-CM

## 2010-08-26 LAB — COMPREHENSIVE METABOLIC PANEL
AST: 29 U/L (ref 0–37)
Alkaline Phosphatase: 78 U/L (ref 39–117)
BUN: 13 mg/dL (ref 6–23)
Creatinine, Ser: 0.67 mg/dL (ref 0.40–1.20)
Glucose, Bld: 114 mg/dL — ABNORMAL HIGH (ref 70–99)

## 2010-08-26 LAB — CBC WITH DIFFERENTIAL/PLATELET
BASO%: 0.5 % (ref 0.0–2.0)
Basophils Absolute: 0 10*3/uL (ref 0.0–0.1)
EOS%: 1.7 % (ref 0.0–7.0)
HCT: 37.4 % (ref 34.8–46.6)
LYMPH%: 34.8 % (ref 14.0–49.7)
MCH: 31.3 pg (ref 25.1–34.0)
MCHC: 34.3 g/dL (ref 31.5–36.0)
MCV: 91.2 fL (ref 79.5–101.0)
MONO%: 10.8 % (ref 0.0–14.0)
NEUT%: 52.2 % (ref 38.4–76.8)
Platelets: 233 10*3/uL (ref 145–400)

## 2010-08-27 ENCOUNTER — Inpatient Hospital Stay (HOSPITAL_COMMUNITY): Admission: RE | Admit: 2010-08-27 | Payer: Self-pay | Source: Ambulatory Visit

## 2010-08-27 ENCOUNTER — Encounter (HOSPITAL_COMMUNITY): Payer: Self-pay

## 2010-08-27 ENCOUNTER — Encounter (HOSPITAL_COMMUNITY)
Admission: RE | Admit: 2010-08-27 | Discharge: 2010-08-27 | Disposition: A | Discharge status: Home / Self Care | Payer: Medicare Other | Source: Ambulatory Visit | Attending: Oncology | Admitting: Oncology

## 2010-08-27 ENCOUNTER — Other Ambulatory Visit: Payer: Self-pay | Admitting: Oncology

## 2010-08-27 DIAGNOSIS — R229 Localized swelling, mass and lump, unspecified: Secondary | ICD-10-CM | POA: Insufficient documentation

## 2010-08-27 DIAGNOSIS — C8290 Follicular lymphoma, unspecified, unspecified site: Secondary | ICD-10-CM

## 2010-08-27 DIAGNOSIS — K449 Diaphragmatic hernia without obstruction or gangrene: Secondary | ICD-10-CM | POA: Insufficient documentation

## 2010-08-27 DIAGNOSIS — R109 Unspecified abdominal pain: Secondary | ICD-10-CM | POA: Insufficient documentation

## 2010-08-27 DIAGNOSIS — Z87898 Personal history of other specified conditions: Secondary | ICD-10-CM | POA: Insufficient documentation

## 2010-08-27 DIAGNOSIS — J984 Other disorders of lung: Secondary | ICD-10-CM | POA: Insufficient documentation

## 2010-08-27 HISTORY — DX: Malignant (primary) neoplasm, unspecified: C80.1

## 2010-08-27 MED ORDER — IOHEXOL 300 MG/ML  SOLN
100.0000 mL | Freq: Once | INTRAMUSCULAR | Status: AC | PRN
  Administered 2010-08-27: 100 mL via INTRAVENOUS

## 2010-09-03 ENCOUNTER — Encounter (HOSPITAL_BASED_OUTPATIENT_CLINIC_OR_DEPARTMENT_OTHER): Payer: Medicare Other | Admitting: Oncology

## 2010-09-03 DIAGNOSIS — C8589 Other specified types of non-Hodgkin lymphoma, extranodal and solid organ sites: Secondary | ICD-10-CM

## 2010-09-15 NOTE — Assessment & Plan Note (Signed)
[facility]                            CARDIOLOGY OFFICE NOTE   NAME:[name], [name] PIDGEON                    MRN:          [id number]  DATE:06/21/2007                            DOB:          [birth year]    [name] [name] returns today for follow-up.   PROBLEM LIST:  1. Mitral valve prolapse.  2. History of tachy palpitations.  3. Mild mixed hyperlipidemia.   She has been feeling well except notes a pulsation in her ear when she  lays on her side.  She has had very few palpitations.   Her current meds are atenolol 25 mg daily, multivitamin daily, calcium  daily, Nexium 40 mg a day, fish oil 1000 mg daily, Evista 60 mg a day.   Recent lipids showed a total cholesterol 220, triglycerides 190, HDL 57,  LDL 125.  Total cholesterol HDL ratio was less than 4.   She says she just cannot avoid sweets.   She is having no symptoms of angina or ischemia.   EXAM:  Today her blood pressure is 135/74, pulse 69 and regular.  Weight  is 151.  HEENT:  Normocephalic, atraumatic.  PERRL.  Extraocular is intact.  Sclerae are clear.  Face symmetry normal.  Carotids are equal bilateral  without bruits, no JVD.  Thyroid is not enlarged.  Trachea is midline.  LUNGS:  Clear.  HEART:  Reveals a regular rate and rhythm.  No gallop.  Abdominal exam is soft, good bowel sounds.  There is no edema.  Pulses are intact.  NEURO:  Exam is intact.   EKG shows sinus rhythm with no ST-segment changes.   A 2-D echocardiogram June 23, 2006 showed mild mitral valve prolapse  involving the posterior leaflet.  There is mild mitral regurgitation.  Left ventricular chamber size and overall function was normal.   ASSESSMENT/PLAN:  [name] [name] is doing well from our standpoint.  I  have reiterated a low carbohydrate diet, otherwise I have made no  changes.  We renewed her atenolol.  We will plan on seeing her back in a  year.     Thomas C. Daleen Squibb, MD, [facility]  Electronically  Signed    TCW/MedQ  DD: 06/21/2007  DT: 06/22/2007  Job #: [id number]   cc:   Lorin Picket A. Gerda Diss, MD

## 2010-09-15 NOTE — Assessment & Plan Note (Signed)
[facility]                         GASTROENTEROLOGY OFFICE NOTE   NAME:[name] [name], [name] [name]                    MRN:          [id number]  DATE:08/31/2006                            DOB:          1941/04/05    This sweet lady comes in on April 30 and states she has IBS and  complains of a lot of mucus in her stools.  Sometimes she has a little  seepage in the rectum and sometimes she has cramping.  We discussed in  detail her irritable bowel syndrome.  I always have thought this might  be some outlet delay syndrome as related here as well, versus some anal  incompetence.  We talked about maybe going on a study for alternating  diarrhea or constipation, but with her other problems, she probably will  not be a candidate for that.  She also has a history of non-Hodgkin's  lymphoma and is under therapy with Dr. Pierce Crane.  She has used Canasa  suppositories for her rectal discharge before, but she says it only is  minimally helpful.  She has a history of mild steatosis as well as has  known tachybrady syndrome and takes atenolol for that.  She does have  mitral valve prolapse as well.  She does have a history of diverticular  disease with possible stenosis of the left colon from previous ischemia.   PHYSICAL EXAMINATION:  She looks about the same.  Weight is 146.  Blood pressure 118/72.  Pulse 64 and regular.  HEENT:  EOMI.  PERRLA.  Sclerae are anicteric.  Conjunctivae are pink.  NECK:  Unremarkable.  CHEST:  Clear to auscultation and percussion without adventitious  sounds.  CARDIAC:  Unremarkable.  ABDOMEN:  Bowel sounds are normoactive.  Abdomen is soft, non-tender and  non-distended.  There are no abdominal masses, tenderness, splenic  enlargement or hepatomegaly.  EXTREMITIES:  Unremarkable.  RECTAL:  There are no masses.  Stool is Hemoccult negative.   IMPRESSION:  Irritable bowel syndrome with alternating diarrhea and  constipation and possible  anal incompetence with seepage as described.   RECOMMENDATION:  Increase her fiber, try the Canasa suppositories 1000  mg, try using MiraLax to regulate her bowel activity more carefully so  that she does not have these intermittent episodes of diarrhea and  constipation and has more regular bowel activity.  I also gave her some  Align to take and some Xifaxan, to take one b.i.d.  Hopefully, this will  be helpful to her, but she has had this problem for quite some time and  I told her maybe Dr. Leone Payor would be able to straighten her out.     Ulyess Mort, MD  Electronically Signed    SML/MedQ  DD: 08/31/2006  DT: 09/01/2006  Job #: [id number]

## 2010-09-15 NOTE — Assessment & Plan Note (Signed)
[facility]                         GASTROENTEROLOGY OFFICE NOTE   NAME:[name] [name], [name] [name]                    MRN:          [id number]  DATE:03/17/2007                            DOB:          1940/11/11    CHIEF COMPLAINT:  Establish with me after seeing Dr. Corinda Gubler, irritable  bowel syndrome, problem with mucus in bowel movements, change in bowel,  fecal incontinence.   HISTORY:  This is a pleasant 70 year old woman that has had problems  with fecal incontinence with staining of her underpants off and on.  She  has urgent defecation and mucus with her bowel movements and some clear  mucus discharge.  She seems to move her bowel movements somewhat  frequently but also has hard balls of stool.  She has a known lax  sphincter it sounds like.  She had been placed on some Align which seems  to help but she is off of it and has had worsening symptoms.  In the  past, she has been given some Xifaxan 1 twice a day.  She does have  somewhat of a mixed or alternating pattern.  She has a history of non-  Hodgkin's lymphoma that was in the mesentery and she has anxiety over  possible recurrence of that.  She has been treated with CHOP and Rituxan  but has been in remission it sounds like, from what she tells me.  Dr.  Donnie Coffin is her oncologist.   CURRENT MEDICATIONS:  1. Evista 60 mg daily.  2. Atenolol 25 mg daily.  3. Creon 1 a day.  4. Multivitamin daily.  5. Calcium 600 mg two daily (question vitamin D).  6. Nexium 40 mg daily.   DRUG ALLERGIES:  1. ROBINUL.  2. PAMINE FORTE.  3. IODINE.  4. SULFA.  5. CODEINE.   Note, she was started on Creon a while back for irritable bowel  symptoms.  She has no known pancreatic insufficiency.  She is not clear  if that has helped her.  She thinks at one time it might have.   PAST MEDICAL HISTORY:  1. Irritable bowel syndrome.  2. Prior cholecystectomy.  3. History of non-Hodgkin's lymphoma as above.  4.  Diverticulosis of the colon with luminal stenosis.  5. Last colonoscopy, November 30, 2005, was an incomplete examination seen      of  the ascending colon.  She had quite a bit of pain but she does      not remember any of the procedure.  6. EGD, November 30, 2005, showing hiatal hernia otherwise unremarkable.  7. Steatosis of the liver is reported.  8. History of mitral valve prolapse.  9. Tachybrady syndrome on atenolol.  10.Pelvic prolapse surgery with anterior/posterior vaginal repair,      sacrospinous ligament suspension, and cystoscopy by Dr. Henderson Cloud.  11.Prior hysterectomy in 1981.  12.Prior tonsillectomy.  13.Prior bladder suspension.  14.Bunionectomy.  15.Abdominal lymph node removal.  16.Osteopenia.  17.Prior vasovagal syncope.  18.Ischemic colitis suspected on flexible sigmoidoscopy, January 2005.      Biopsies demonstrated nonspecific changes that were minimal.   PHYSICAL EXAMINATION:  VITAL SIGNS:  Height 5 feet 3 inches, weight 153  pounds, pulse 64, blood pressure 128/80.  GENERAL:  This is a pleasant, well developed, elderly, white woman  looking younger than stated age.  ABDOMEN:  Soft and nontender.  RECTAL:  In the presence of female nursing staff shows a lax resting  sphincter tone.  There is good sensation.  She is able to voluntarily  squeeze well.  There is no abnormal descent.  There is no mass.  There  is no rectocele that is significant at this time.  There is really not  much stool at all in the vault.   ASSESSMENT:  1. Irritable bowel syndrome, mixed pattern, tending toward diarrhea it      seems.  She has been helped by [employer], especially with gas and      bloating.  2. Fecal incontinence with lax sphincter.  Her first child was a      forceps delivery that was her only child.  She had a large      episiotomy and problems.  She has had pelvic prolapse problems.  I      think that this is all inter-related.  3. History of incomplete colonoscopy.   PLAN:   1. Stop Creon, not clear that is helping.  2. Go back on Align, one daily.  3. For the fecal incontinence, we will carefully try to titrate some      Imodium, start with a half tablet or half a dose a day if she uses      liquid and titrating for affect.  4. Consider colorectal surgical evaluation.  Perhaps there is some      noninvasive treatment or something that could help her.  This is a      difficult problem to treat, particularly with the lax sphincter.  5. Regarding her incomplete colonoscopy, she did not remember it, so      that is a good sign but apparently she was quite uncomfortable and      had an irritable bowel affect.  Considerations      would be for somewhat heavier sedation or even deep sedation with      Propofol at the hospital.  We will regroup on that in January when      she returns.     Iva Boop, MD,FACG  Electronically Signed    CEG/MedQ  DD: 03/18/2007  DT: 03/19/2007  Job #: [id number]   cc:   Pierce Crane, MD  Donna Bernard, M.D.  Guy Sandifer Henderson Cloud, M.D.

## 2010-09-15 NOTE — Letter (Signed)
August 31, 2006    Sherrin Daisy  [address]  [place], [place] [address]   RE:  JEANNIA, TATRO  MRN:  [id number]  /  DOB:  1940-05-26   Dear Liborio Nixon,   I just wanted to thank you so much for the thoughtful gift; the clock is  beautiful and I look forward to using it and will remember you always  for this.   I certainly will miss by practice and patients, which have been my life  for so many years.  I do hope and wish you good health in the years to  come and hopefully Dr. Leone Payor will be able to find a better answer for  you for some of your gastrointestinal symptomatology.    Sincerely,      Ulyess Mort, MD  Electronically Signed    SML/MedQ  DD: 08/31/2006  DT: 09/01/2006  Job #: [id number]

## 2010-09-18 NOTE — H&P (Signed)
NAME:  [name] [name], [name] [name] NO.:  [id number]   MEDICAL RECORD NO.:  [id number]          PATIENT TYPE:   LOCATION:                                 FACILITY:   PHYSICIAN:  Guy Sandifer. Henderson Cloud, M.D.      DATE OF BIRTH:   DATE OF ADMISSION:  07/15/2005  DATE OF DISCHARGE:                                HISTORY & PHYSICAL   CHIEF COMPLAINT:  Rectocele.   HISTORY OF PRESENT ILLNESS:  This patient is a 70 year old married white  female G1, P1 status post hysterectomy with bilateral salpingo-oophorectomy  in 1981.  She subsequently had a bladder suspension procedure in the 1990s.  She has not had a posterior vaginal repair.  She has increasing symptoms of  difficulty moving her bowels and having to manually splint to have a bowel  movement.  Also feels as though things are falling from the vagina.  On  examination she has a rectocele at the vaginal introitus.  She also has loss  of apical support.  Finally, on an abdominopelvic CT scan on June 24, 2005 ordered for surveillance of her non-Hodgkin's lymphoma there was a note  of a tiny dot of air in the bladder.  Patient denied any recent  catheterization or urinary tract infection at that time.  After discussion  with Dr. Annabell Howells the recommendation is for a diagnostic cystoscopy at the time  of surgery as well.  Potential risks and complications have been reviewed  preoperatively.   PAST MEDICAL HISTORY:  1.  Non-Hodgkin's lymphoma.  2.  Irritable bowel syndrome.  3.  Osteopenia.   PAST SURGICAL HISTORY:  1.  Hysterectomy 1981.  2.  Tonsillectomy.  3.  Bunionectomy.  4.  Bladder tacking.  5.  Removal of abdominal lymph nodes.   FAMILY HISTORY:  Prostate cancer in father.  Dementia in mother.  Hypothyroidism in mother.   OBSTETRICAL HISTORY:  Vaginal delivery x1.   MEDICATIONS:  1.  Atenolol.  2.  Nexium.  3.  Evista.  4.  Creon.  5.  Calcium.   ALLERGIES:  Hives with IODINATED CONTRAST, nausea and vomiting  with CODEINE,  swelling with SULFA.   SOCIAL HISTORY:  Denies alcohol, tobacco, or drug abuse.   REVIEW OF SYSTEMS:  NEUROLOGIC:  Denies headache.  CARDIAC:  Denies chest  pain.  PULMONARY:  Denies shortness of breath.  GI:  Denies recent changes  in bowel habits.   PHYSICAL EXAMINATION:  VITAL SIGNS:  Height 5 feet 3 inches, weight 148  pounds, blood pressure 132/86.  HEENT:  Without thyromegaly.  LUNGS:  Clear to auscultation.  HEART:  Regular rate and rhythm.  BACK:  Without CVA tenderness.  BREASTS:  Without mass, retraction, discharge.  ABDOMEN:  Soft, nontender, without masses.  PELVIC:  Vulva, vagina without lesion.  Adnexa nontender without masses.  A  rectocele was presenting at the vaginal introitus.  There is also some loss  of apical support and perhaps a first degree cystocele.  RECTAL:  Confirms.  EXTREMITIES:  Grossly within normal limits.  NEUROLOGIC:  Grossly within normal limits.   ASSESSMENT:  Pelvic relaxation.   PLAN:  Anterior posterior vaginal repair with sacrospinous ligament  suspension and cystoscopy.      Guy Sandifer Henderson Cloud, M.D.  Electronically Signed     JET/MEDQ  D:  07/09/2005  T:  07/09/2005  Job:  [id number]

## 2010-09-18 NOTE — Op Note (Signed)
[place]  Patient:    [name], [name] [name]                    MRN: [id number] Proc. Date: 12/02/99 Adm. Date:  [id number] Disc. Date: [id number] Attending:  Tempie Donning CC:         Pierce Crane, M.D.   Operative Report  PREOPERATIVE DIAGNOSIS:  Lymphoma, on chemotherapy, need of venous access.  POSTOPERATIVE DIAGNOSIS:  Lymphoma, on chemotherapy, need of venous access.  OPERATION PERFORMED:  Insertion of Life-Port venous access system, right subclavian.  SURGEON:  Gita Kudo, M.D.  ANESTHESIA:  MAC--IV sedation, local 1% Xylocaine.  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  She is undergoing chemotherapy and needs venous access because it is becoming more difficult to do venipunctures.  OPERATIVE FINDINGS:  The right subclavian was accessed on the first attempt and there were no complications during surgery.  The catheter was in good position by blood flow return and x-ray.  DESCRIPTION OF PROCEDURE:  Under satisfactory intravenous sedation, having received 1.0 gm Ancef, the patients right chest was prepped and draped in a standard fashion.  She was positioned properly.  1% Xylocaine was infiltrated throughout for good analgesia.  A subclavian puncture made beneath the right clavicle and a J-wire inserted into the atrium as confirmed on fluoroscopy.  Following this, a transverse incision made along the right sternal border and a subcutaneous pocket made down to the fascia over the muscle.  This was adequate to accommodate the reservoir.  Then a tunnel was made from the enlarged puncture site through the deep subcu to the reservoir pocket.  The catheter was then cut to the appropriate length and hooked to the reservoir system which was flushed with heparin.  The reservoir was then anchored to the fascia with interrupted  2-0 Vicryl.  Following this, the dilator and introducer were placed over the J-wire. Fluoroscopy confirmed good position and correct length of catheter.  Then the J-wire and dilator withdrawn.  The catheter was placed into the introducer which was then peeled away, leaving the catheter in good position.  The fluoroscopy confirmed its position as well as blood flow.  The unit was then flushed with concentrated heparin and the pocket closed in layers with interrupted 4-0 Vicryl and then Steri-Strips for the skin at both sites. Sterile absorbent dressing was applied and the patient went to the recovery room from the operating room in good condition.  She will get a chest x-ray and then be discharged.  She is due to get chemotherapy in two to three days. DD:  12/02/99 TD:  12/03/99 Job: [id number]

## 2010-09-18 NOTE — Assessment & Plan Note (Signed)
[facility]                           GASTROENTEROLOGY OFFICE NOTE   NAME:Brymer, [name] [name]                    MRN:          [id number]  DATE:11/24/2005                            DOB:          04-17-1941    [name] comes in on July 25.  Said she has been having some rectal seepage of  mucus and feels the urge to go all the time.  She has non-Hodgkin's  lymphoma.  Says it is in her mesenteric nodes.  Has occasional abdominal  pain.  This has been concerning to her.  She says she has a bowel movement  daily and then she has almost like scybala all day long after this.  She  said she gets a CT scan every six months by Dr. Pierce Crane, and things have  been pretty quiescent.  She did have a rectocele fixed, and this has helped.  She really does not have to do the pushing as much anymore, if at all.  Her  last colonoscopic examination was in January, 2005 and revealed only some  mucosal erosions in the sigmoid colon, which I thought might be ischemic  colitis but nothing else of significance.  She also had an upper endoscopic  examination in June, 2002, which revealed a 3 cm hiatal hernia and some mild  gastritis.   PHYSICAL EXAMINATION:  VITAL SIGNS:  She weighed 150.  Blood pressure  110/66.  Pulse 62 and regular.  __________ unremarkable.   IMPRESSION:  1.  Irritable bowel syndrome with some questionable outlet delay syndrome      associated in a patient who has had a rectocele repaired.  2.  Status post colonoscopy.  3.  History of non-Hodgkin's lymphoma, under therapy with Dr. Pierce Crane.  4.  History of mild steatosis.  5.  History of mitral valve prolapse.  6.  History of tachy-brady syndrome.  Patient is on atenolol.  7.  Increasing rectal discharge, as described, rule out cryptitis versus      proctitis.   RECOMMENDATIONS:  Try some Canasa suppositories, I gave her six.  Told her  if these helped, I could give her some more.  Told her to  use some  Metamucil.  Did schedule her for an endo colon sometime in the very near  future.  I think she is just real anxious as much as anything and hopefully,  the above suppositories will help.  We will refill this if she finds them to  be a positive help to her.                                   Ulyess Mort, MD   SML/MedQ  DD:  11/24/2005  DT:  11/24/2005  Job #:  [id number]

## 2010-09-18 NOTE — Procedures (Signed)
   NAME:  [name] [name], [name] [name] NO.:  [id number]   MEDICAL RECORD NO.:  [id number]                   PATIENT TYPE:  OUT   LOCATION:  RAD                                  FACILITY:  [facility]   PHYSICIAN:  Vida Roller, M.D.                DATE OF BIRTH:  03/28/1941   DATE OF PROCEDURE:  01/24/2003  DATE OF DISCHARGE:                                  ECHOCARDIOGRAM   TAPE NUMBER:  [id number].   TAPE COUNT:  [id number].   INDICATIONS FOR PROCEDURE:  This is a woman with a history of mitral valve  prolapse.   TECHNICAL QUALITY:  The technical quality of the study is limited.   M-MODE TRACINGS:  The aorta is 30 mm.   Left atrium is 28 mm.   The septum is 10 mm.   Posterior wall is 9 mm.   Left ventricular diastolic dimension is 32 mm.   Left ventricular systolic dimension is 27 mm.   2-D AND DOPPLER IMAGING:  The left ventricle is normal size with normal  systolic function. Diastolic function is mildly impaired. There is no  evidence of hypertrophy. Overall wall motion is normal.   The right ventricle is normal size with normal systolic function.   Both atria are normal size. There is no evidence of an atrial septal defect.   The aortic valve is mildly sclerotic with no evidence of stenosis or  regurgitation.   The mitral valve is morphologically unremarkable with no evidence of mitral  valve prolapse. There is trivial insufficiency. No stenosis is seen.   The tricuspid valve is morphologically unremarkable with trace  insufficiency. No stenosis is seen.   The pulmonic valve was not well seen.   The pericardial structures appear normal.   The inferior vena cava appears normal.      ___________________________________________                                            Vida Roller, M.D.   JH/MEDQ  D:  01/24/2003  T:  01/24/2003  Job:  [id number]

## 2010-09-18 NOTE — Op Note (Signed)
NAME:  [name] [name], [name] [name]             ACCOUNT NO.:  [id number]   MEDICAL RECORD NO.:  [id number]          PATIENT TYPE:  INP   LOCATION:  9399                          FACILITY:  [facility]   PHYSICIAN:  Guy Sandifer. Henderson Cloud, M.D. DATE OF BIRTH:  [birth year]   DATE OF PROCEDURE:  07/15/2005  DATE OF DISCHARGE:                                 OPERATIVE REPORT   PREOPERATIVE DIAGNOSIS:  Pelvic prolapse.   POSTOPERATIVE DIAGNOSIS:  Pelvic prolapse.   PROCEDURE:  Anterior-posterior vaginal repair, sacrospinous ligament  suspension and cystoscopy.   SURGEON:  Harold Hedge, M.D.   ASSISTANT:  Freddy Finner, M.D.   ANESTHESIA:  General endotracheal intubation.   SPECIMENS:  Vaginal mucosa.   ESTIMATED BLOOD LOSS:  Minimal   INDICATIONS:  The patient is a 70 year old married white female gravida 1,  para 1, status post hysterectomy, BSO with a subsequent bladder suspension  procedure in the 90s who has symptomatic pelvic relaxation. Details are  dictated in history and physical. She also had a small bubble noted in the  bladder upon a previous CAT scan. Anterior-posterior vaginal repair with  sacrospinous ligament suspension and cystoscopy was discussed with the  patient preoperatively. Potential risks, complications discussed  preoperatively including but not limited to infection, bowel, bladder,  ureteral damage, bleeding requiring transfusion of blood products with  possible transfusion reaction, HIV and hepatitis acquisition, DVT, PE,  pneumonia, fistula formation, postoperative dyspareunia, and vaginal  narrowing requiring dilators, recurrent prolapse and laparotomy. All  questions were answered, consent signed on the chart.   PROCEDURE:  The patient is taken to operating room where she is identified,  placed in dorsosupine position and general anesthesia is induced via  endotracheal intubation. She is then placed in dorsal lithotomy position  where she is prepped with Hibiclens,  straight catheterized and draped in  sterile fashion. Weighted speculum was placed and a small cystocele was  noted. The anterior vaginal mucosa under the cystocele was injected with  injected with 0.5% lidocaine with 1:200,000 epinephrine. Inverted T-shaped  incision was then done in the anterior mucosa and dissection is carried out  freeing the anterior vaginal mucosa. Pursestring sutures of 0 Monocryl  placed. Small amount of mucosa was trimmed and the incision is  reapproximated with interrupted 0 Monocryl sutures. Then turning of  posteriorly, the posterior mucosa is infiltrated with the same dilute  epinephrine solution. A diamond shaped wedge of tissue was resected from the  posterior perineal body and the posterior mucosa was dissected in the  midline from the underlying rectum to within 2 to 3 cm of the vaginal apex.  Dissection is then carried out sharply and bluntly bilaterally freeing up  the rectocele. The right supraspinous ligament is then identified carefully  by palpation. Using the Capio needle driver a 0 Vicryl suture was placed  through the sacrospinous ligament two fingerbreadths medial to the ischial  spine. It is then sewn with a figure-of-eight to the backside of the vaginal  mucosa at the apex. It is held. The rectovaginal fascia was then  reapproximated in midline with interrupted 0 Monocryl suture. Excess mucosa  was trimmed and 2-0 running locking Monocryl suture was used to  reapproximate about one half the mucosa. The sacrospinous stitch was then  tied down, which elevates the apex of the vagina quite well. The 2-0  Monocryl was then continued on down closing the posterior mucosa. Perineal  body is dissected out. The perineal body was then reapproximated with  interrupted 0 Monocryl suture. The 2-0 Monocryl was then continued on down  to close the mucosa and skin in standard episiotomy type fashion. The  cystoscope using the 70 degrees scope was then placed in  the urethra and  advanced under direct visualization. 360 degrees inspection of the bladder  reveals no abnormalities. No abnormal vessels or mucosa is identified. Both  ureters identified and good puff of urine is noted bilaterally. The  cystoscope was then removed Foley catheter was placed to straight drain and  a vaginal pack with estrogen cream was placed. All counts correct. The  patient is awakened, taken to recovery room in stable condition.      Guy Sandifer Henderson Cloud, M.D.  Electronically Signed     JET/MEDQ  D:  07/15/2005  T:  07/15/2005  Job:  [id number]

## 2010-09-18 NOTE — Op Note (Signed)
[place]. [facility]  Patient:    [name] [name], [name] [name] Visit Number: [id number] MRN: [id number]          Service Type: DSU Location: [phone] Attending Physician:  Tempie Donning Dictated by:   Gita Kudo, M.D. Proc. Date: 05/23/01 Admit Date:  05/23/2001 Discharge Date: 05/24/2001   CC:         Ulyess Mort, M.D. [facility]  Pierce Crane, M.D.   Operative Report  PREOPERATIVE DIAGNOSIS:  Biliary sludge, abnormal gallbladder wall, cholecystitis.  POSTOPERATIVE DIAGNOSIS:  Biliary sludge, abnormal gallbladder wall, cholecystitis, pending pathology.  OPERATION PERFORMED:  Laparoscopic cholecystectomy.  SURGEON:  Gita Kudo, M.D.  ASSISTANT:  Anselm Pancoast. Zachery Dakins, M.D.  ANESTHESIA:  General endotracheal.  INDICATIONS FOR PROCEDURE:  The patient is a 70 year old female approximately two years post laparotomy and chemotherapy for lymphoma, present with abdominal pain and abnormal ultrasound.  Her liver function studies are normal.  She does have indigestion, thickened gallbladder.  The grandfather required cholecystectomy.  There were no abdominal masses noted.  The liver looked normal.  There were adhesions from her previous midline incision.  The gallbladder was thickened and the cystic duct and artery were normal in size and location.  DESCRIPTION OF PROCEDURE:  Under satisfactory general anesthesia, the patients abdomen was prepped and draped in a standard fashion.  She received 400 mg of Cipro intravenously preop. A standard abdominal prep performed and transverse incision made just above the umbilicus and carried into the peritoneal cavity.  Finger dissection confirmed the presence inside the peritoneum and also swept adhesions away.  An operating Hasson port was inserted, secured and good CO2 pneumoperitoneum established.  A camera was placed and under direct vision, two #5 ports were placed laterally and one #10 port  medially through the Marcaine infiltrated skin incisions.  With graspers in the lateral port giving excellent exposure, I carefully took down the adhesions to the gallbladder and dissected the cystic duct gallbladder junction.  When we were certain of the cystic ducts anatomy, it was controlled with multiple metal clips and divided.  Likewise the cystic artery was dissected, identified, clipped and cut.  The gallbladder was then removed from below upward using a coagulating spatula for hemostasis and dissection. After the gallbladder was removed, the gallbladder bed was made dry by cautery, lavaged with saline and suctioned dry.  Following this, the camera was moved to the upper port and a large grasper placed through the umbilical port and used to extract the gallbladder intact and without spillage or complication.  Then the ports and CO2 were released. The midline was infiltrated with Marcaine and then closed with a previous figure-of-eight and a second interrupted 0 Vicryl suture.  Subcutaneous approximated with 4-0 Vicryl and all skin edges with Steri-Strips.  Sterile dressings were then applied.  The patient went to the recovery room from the operating room in good condition without complication. Dictated by:   Gita Kudo, M.D. Attending Physician:  Tempie Donning DD:  05/23/01 TD:  05/24/01 Job: [id number]

## 2010-09-18 NOTE — Op Note (Signed)
[place]. [facility]  Patient:    [name] [name], [name] [name]                    MRN: [id number] Proc. Date: 05/23/00 Adm. Date:  [id number] Attending:  Tempie Donning                           Operative Report  PREOPERATIVE DIAGNOSIS:  Indwelling Lifeport venous access system, post completion chemotherapy for lymphoma.  POSTOPERATIVE DIAGNOSIS: Indwelling Lifeport venous access system, post completion chemotherapy for lymphoma.  OPERATION:  Removal indwelling venous access system (Lifeport).  SURGEON:  Gita Kudo, M.D.  ANESTHESIA:  MAC - IV sedation, local 1% Xylocaine.  CLINICAL SUMMARY:  A 70 year old female had abdominal lymphoma diagnosed and then treated with chemotherapy.  She comes in now to have the Port-A-Cath removed.  OPERATIVE FINDINGS:  The Port-A-Cath was in good position and removed totally without complication.  DESCRIPTION OF PROCEDURE:  Under satisfactory intravenous sedation, the patients right chest and breast were prepped and draped in a standard fashion.  A total of 20 cc of 1% Xylocaine was infiltrated for good analgesia. The previous incision was reentered and carried down to the reservoir pocket. Then the reservoir was grasped, and the three sutures holding it in place were cut.  The entire reservoir and attached catheter were removed after the patient was placed in the head-up position to avoid bleeding.  Pressure was applied, and then the wound looked good.  It was lavaged with saline and then closed in layers with interrupted 3-0 deep Vicryl sutures and 4-0 subcuticular Vicryl.  Skin edges approximated with Steri-Strips and sterile absorbent dressing applied.  She went to the recovery room from the operating room in good condition without complication. DD:  05/23/00 TD:  05/23/00 Job: [id number]

## 2010-09-18 NOTE — Discharge Summary (Signed)
NAME:  [name] [name], [name] [name] NO.:  [id number]   MEDICAL RECORD NO.:  [id number]          PATIENT TYPE:  INP   LOCATION:  9306                          FACILITY:  [facility]   PHYSICIAN:  Guy Sandifer. Henderson Cloud, M.D. DATE OF BIRTH:  Jul 05, 1940   DATE OF ADMISSION:  07/15/2005  DATE OF DISCHARGE:  07/17/2005                                 DISCHARGE SUMMARY   ADMISSION DIAGNOSIS:  Pelvic prolapse.   DISCHARGE DIAGNOSIS:  Pelvic prolapse.   PROCEDURE:  On July 15, 2005, anterior and posterior vaginal repair,  sacrospinous ligament suspension and cystoscopy.   REASON FOR ADMISSION:  This patient is a 70 year old married white female,  gravida 1, para 1, status post hysterectomy, BSO, and a subsequent bladder  suspension with increasingly symptomatic pelvic relaxation.  Details are  dictated in the history and physical.  She is admitted for surgical  management.   HOSPITAL COURSE:  She is admitted to the hospital and undergoes the above  procedure with an estimated blood loss of less than 100 mL.  On the evening  of surgery, she has good pain relief and is tolerating liquids well and  ambulating.  Vital signs are stable.  She is afebrile with clear urine  output.  On the first postoperative day, hemoglobin is 11.2, white count  5.9, and she remains afebrile with stable vital signs.  She is tolerating a  regular diet.  Foley has been removed, but no voiding as of yet.  On the day  of discharge, she is voiding well, tolerating a regular diet, feeling good,  and ambulating well.  Vital signs are stable and she is afebrile.   CONDITION ON DISCHARGE:  Good.   DIET:  Regular as tolerated.   ACTIVITY:  No lifting, no operation of automobiles, and no vaginal entry.  She is to call the office for problems including, but not limited to  temperature of 101 degrees, persistent nausea and vomiting, increasing pain,  or heavy vaginal bleeding.   FOLLOW UP:  In the office in 2 weeks.   DISCHARGE MEDICATIONS:  1.  Percocet 5/325 mg #30 one to two p.o. q.6 hours p.r.n.  2.  Ibuprofen 400 to 600 mg q.6 hours p.r.n.  3.  Multivitamin daily.  4.  Cipro 250 mg #14 one p.o. b.i.d., no refill.      Guy Sandifer Henderson Cloud, M.D.  Electronically Signed     JET/MEDQ  D:  07/17/2005  T:  07/17/2005  Job:  [id number]

## 2010-09-18 NOTE — Op Note (Signed)
[address] [facility]  Patient:    ADLYNN, LOWENSTEIN                    MRN: [id number] Proc. Date: 12/11/99 Adm. Date:  [id number] Disc. Date: [id number] Attending:  Abigail Miyamoto A                           Operative Report  PREOPERATIVE DIAGNOSIS:  Malfunctioning Port-A-Cath.  POSTOPERATIVE DIAGNOSIS:  Malfunctioning Port-A-Cath.  PROCEDURE:  Manipulation of Port-A-Cath.  SURGEON:  Abigail Miyamoto, M.D.  ANESTHESIA:  Lidocaine 1% and monitored anesthesia care.  ESTIMATED BLOOD LOSS:  Minimal.  INDICATIONS FOR PROCEDURE:  Ms. Dawnya Grams is a 70 year old female who underwent a right subclavian Port-A-Cath insertion by Dr. Maryagnes Amos approximately one week ago.  The catheter worked well for two days, and then began to not function with either flush or return.  A portogram was performed which showed a kink in the catheter.  Therefore, decision was made to take the patient to the operating room to explore the catheter and possibly place a new Port-A-Cath.  FINDINGS:  The patient was found to have an acute kink in the catheter from the angle of insertion.  After creating a larger soft tissue pocket the kink was relieved and the Port-A-Cath again functioning well.  Another portogram showed excellent flush and return of the Port-A-Cath.  DESCRIPTION OF PROCEDURE:  The patient was brought to the operating room, identified as Nada Libman.  She is placed supine on the operating room table and anesthesia was induced.  Her chest was then prepped and draped in the usual sterile fashion.  Using 1% lidocaine, area of previous incision was anesthetized.  Incision was then made with a 15 blade scalpel.  Incision was carried down to the Port-A-Cath catheter with Metzenbaum scissors.  The catheter was then identified along with the kink in the catheter.  It appeared that the kink was from the acute angulation of the catheter coming up the chest wall and then  going into the subclavian vein system.  A tissue pocket was created then with the Metzenbaum scissors, and the kink was easily relieved.  Next, under fluoroscopy, dye was injected through the catheter and showed both excellent flush and return without any extravasation from the catheter.  Again, there was good placement in the subclavian and central venous system.  At this point the skin incision was then closed with 4-0 Monocryl suture.  Steri-Strips, gauze, and tape were then applied.  The Port-A-Cath was left accessed.  The patient tolerated the procedure well.  All sponge, needle, and instrument counts were correct at the end of the procedure.  The patient was then taken in stable condition from the operating room to the recovery room. DD:  12/11/99 TD:  12/11/99 Job: [id number]

## 2010-09-18 NOTE — Assessment & Plan Note (Signed)
[facility]                            CARDIOLOGY OFFICE NOTE   NAME:[name] [name], [name] [name]                    MRN:          [id number]  DATE:06/08/2006                            DOB:          10/26/1940    [name] [name] returns today for further management of her mitral valve  prolapse and tachy palpitations. She has done remarkably well and is  having very few palpitations. She is on atenolol 25 mg a day.   She brings her lipid profile in today, which basically is identical to  last year. Her total cholesterol was 205. Her HDL is 52 and her LDL was  131. Triglycerides were normal. Her triglyceride:HDL ratio was 3.86.   She is having no symptoms of angina or ischemic equivalence. We reviewed  this at length today because of her concerns about heart disease.   Her medicines are:  1. Atenolol 25 mg a day.  2. Evista 60 mg a day.  3. Creon 10 caps per day.  4. Multivitamin.  5. Calcium 600 mg a day.  6. Nexium 40 mg a day.   PHYSICAL EXAMINATION:  Her blood pressure is 129/67. Her pulse is 71 and  regular. EKG is normal. Her weight is 146.  HEENT: Normocephalic, atraumatic. PERRLA. Extra-ocular movements intact.  Sclerae are clear. Facial symmetry is normal. Dentition is satisfactory.  Carotid upstrokes are equal bilaterally without bruits. There is no JVD.  Thyroid is not enlarged. Trachea is midline.  LUNGS:  Are clear.  HEART: Reveals a regular rate and rhythm without gallop, click or  murmur.  ABDOMEN: Soft with good bowel sounds. There is no midline bruit. There  is no hepatomegaly.  EXTREMITIES: Reveals no cyanosis, clubbing or edema. Pulses are intact.  NEURO: Intact.  SKIN: Shows a raised macular papular lesions over her legs that have  been there for three years. She says three dermatologists disagree on  what it is.   ASSESSMENT/PLAN:  I think [name] [name] is doing well. It has been nearly  4 years since we have had a baseline echo.  The last one actually did not  show any prolapse. Will order a 2D  echo. I renewed her atenolol and have reassured her. I have educated her  on what angina and unstable angina feels like. She knows how to respond.     Thomas C. Daleen Squibb, MD, [facility]  Electronically Signed    TCW/MedQ  DD: 06/08/2006  DT: 06/08/2006  Job #: [id number]   cc:   Donna Bernard, M.D.  Guy Sandifer Henderson Cloud, M.D.

## 2010-09-24 ENCOUNTER — Encounter: Payer: Self-pay | Admitting: Cardiology

## 2010-10-01 ENCOUNTER — Encounter: Payer: Self-pay | Admitting: Cardiology

## 2010-10-01 ENCOUNTER — Ambulatory Visit (INDEPENDENT_AMBULATORY_CARE_PROVIDER_SITE_OTHER): Payer: Medicare Other | Admitting: Cardiology

## 2010-10-01 VITALS — BP 96/60 | HR 71 | Ht 63.0 in | Wt 143.0 lb

## 2010-10-01 DIAGNOSIS — I059 Rheumatic mitral valve disease, unspecified: Secondary | ICD-10-CM

## 2010-10-01 DIAGNOSIS — I495 Sick sinus syndrome: Secondary | ICD-10-CM

## 2010-10-01 DIAGNOSIS — R002 Palpitations: Secondary | ICD-10-CM

## 2010-10-01 DIAGNOSIS — R079 Chest pain, unspecified: Secondary | ICD-10-CM | POA: Insufficient documentation

## 2010-10-01 NOTE — Patient Instructions (Signed)
Your physician has requested that you have en exercise stress myoview. For further information please visit [email or web address] Please follow instruction sheet, as given.  Your physician recommends that you schedule a follow-up appointment in: 1 year with Dr. Daleen Squibb

## 2010-10-01 NOTE — Progress Notes (Signed)
HPI [name] [name] comes in today with a chief complaint of chest burning with exertion. This just started several weeks ago. It does not radiate. There no other associated symptoms. It stops with rest. It feels like some of her reflux but she is on a proton pump inhibitor.  She says this usually controls it.  She does have risk factors including age as well as mixed hyperlipidemia. Primary care check her lipids and she says they've been good. She does not take a statin.  She's on aspirin. She's also on atenolol for mitral valve prolapse and palpitations. These have been under excellent control. She has never had a stress test.  Chest pain in the past has been related to her mitral valve prolapse. Past Medical History  Diagnosis Date  . nhl dx'd 07/1999    chemo comp 2001; rituxin comp 2005  . MVP (mitral valve prolapse)   . Brady-tachy syndrome   . HLD (hyperlipidemia)   . Palpitation   . Dyspepsia   . Vasovagal syncope   . Colitis, ischemic   . Fatty liver   . Osteopenia   . Diverticulosis of colon   . IBS (irritable bowel syndrome)   . Incontinence, feces   . Rectocele     Past Surgical History  Procedure Date  . Pelvic prolapse repair   . Hysterectomy - unknown type   . Bladder suspension   . Bunionectomy   . Cholecystectomy 2003    Family History  Problem Relation Age of Onset  . Colon cancer Father   . Prostate cancer Brother   . Colon polyps Brother     History   Social History  . Marital Status: Married    Spouse Name: N/A    Number of Children: N/A  . Years of Education: N/A   Occupational History  . Not on file.   Social History Main Topics  . Smoking status: Former Games developer  . Smokeless tobacco: Not on file  . Alcohol Use: Not on file  . Drug Use: Not on file  . Sexually Active: Not on file   Other Topics Concern  . Not on file   Social History Narrative  . No narrative on file    Allergies  Allergen Reactions  . Codeine   . Glycopyrrolate    . Iodine   . Iohexol      Desc: 50 mg benadryl prior to exam   . Sulfonamide Derivatives     Current Outpatient Prescriptions  Medication Sig Dispense Refill  . aspirin 81 MG tablet Take 81 mg by mouth daily.        Marland Kitchen atenolol (TENORMIN) 25 MG tablet Take 1 tablet (25 mg total) by mouth daily.  30 tablet  5  . calcium carbonate (OS-CAL) 600 MG TABS Take 600 mg by mouth 2 (two) times daily with a meal.        . esomeprazole (NEXIUM) 40 MG capsule Take 40 mg by mouth daily before breakfast.        . estradiol (ESTRACE) 0.1 MG/GM vaginal cream Place 2 g vaginally every other day.        . fish oil-omega-3 fatty acids 1000 MG capsule Take 2 g by mouth daily.        . hyoscyamine (LEVSIN SL) 0.125 MG SL tablet Place 0.125 mg under the tongue every 4 (four) hours as needed.        . Multiple Vitamin (MULTIVITAMIN) tablet Take 1 tablet by mouth daily.        Marland Kitchen  vitamin D, CHOLECALCIFEROL, 400 UNITS tablet Take 400 Units by mouth daily.        . Wheat Dextrin (BENEFIBER) POWD Take by mouth.          ROS Negative other than HPI.   PE General Appearance: well developed, well nourished in no acute distress HEENT: symmetrical face, PERRLA, good dentition  Neck: no JVD, thyromegaly, or adenopathy, trachea midline Chest: symmetric without deformity Cardiac: PMI non-displaced, RRR, normal S1, S2, no gallop or murmur Lung: clear to ausculation and percussion Vascular: all pulses full without bruits  Abdominal: nondistended, nontender, good bowel sounds, no HSM, no bruits Extremities: no cyanosis, clubbing or edema, no sign of DVT, no varicosities  Skin: normal color, no rashes Neuro: alert and oriented x 3, non-focal Pysch: normal affect Filed Vitals:   10/01/10 0924  BP: 96/60  Pulse: 71  Height: 5\' 3"  (1.6 m)  Weight: 143 lb (64.864 kg)    EKG  Labs and Studies Reviewed.   Lab Results  Component Value Date   HGB 12.8 08/26/2010   HCT 37.4 08/26/2010   MCV 91.2 08/26/2010   PLT  233 08/26/2010      Chemistry      Component Value Date/Time   NA 141 08/26/2010 1556   K 3.8 08/26/2010 1556   CL 104 08/26/2010 1556   CO2 29 08/26/2010 1556   BUN 13 08/26/2010 1556   CREATININE 0.67 08/26/2010 1556      Component Value Date/Time   CALCIUM 10.2 08/26/2010 1556   ALKPHOS 78 08/26/2010 1556   AST 29 08/26/2010 1556   ALT 27 08/26/2010 1556   BILITOT 0.5 08/26/2010 1556       No results found for this basename: CHOL   No results found for this basename: HDL   No results found for this basename: LDLCALC   No results found for this basename: TRIG   No results found for this basename: CHOLHDL   No results found for this basename: HGBA1C   Lab Results  Component Value Date   ALT 27 08/26/2010   AST 29 08/26/2010   ALKPHOS 78 08/26/2010   BILITOT 0.5 08/26/2010   Lab Results  Component Value Date   TSH 2.85 08/29/2008

## 2010-10-12 ENCOUNTER — Ambulatory Visit (HOSPITAL_COMMUNITY): Payer: Medicare Other | Attending: Cardiology | Admitting: Radiology

## 2010-10-12 VITALS — Ht 63.0 in | Wt 139.0 lb

## 2010-10-12 DIAGNOSIS — R0989 Other specified symptoms and signs involving the circulatory and respiratory systems: Secondary | ICD-10-CM

## 2010-10-12 DIAGNOSIS — R079 Chest pain, unspecified: Secondary | ICD-10-CM

## 2010-10-12 DIAGNOSIS — I4949 Other premature depolarization: Secondary | ICD-10-CM

## 2010-10-12 DIAGNOSIS — R0609 Other forms of dyspnea: Secondary | ICD-10-CM

## 2010-10-12 MED ORDER — TECHNETIUM TC 99M TETROFOSMIN IV KIT
33.0000 | PACK | Freq: Once | INTRAVENOUS | Status: AC | PRN
  Administered 2010-10-12: 33 via INTRAVENOUS

## 2010-10-12 MED ORDER — ATENOLOL 25 MG PO TABS
25.0000 mg | ORAL_TABLET | Freq: Once | ORAL | Status: AC
  Administered 2010-10-12: 25 mg via ORAL

## 2010-10-12 MED ORDER — TECHNETIUM TC 99M TETROFOSMIN IV KIT
11.0000 | PACK | Freq: Once | INTRAVENOUS | Status: AC | PRN
  Administered 2010-10-12: 11 via INTRAVENOUS

## 2010-10-12 NOTE — Progress Notes (Signed)
[facility] SITE 3 NUCLEAR MED [address] [place] [address] [phone]  Cardiology Nuclear Med Study  [name] [name] is a 70 y.o. female [id number] 03-08-1941   Nuclear Med Background Indication for Stress Test:  Evaluation for Ischemia and Abnormal EKG History:  No previous documented CAD,5/10 Echo: EF=55%, History of Chemo and MVP Cardiac Risk Factors: History of Smoking and Lipids  Symptoms:  Chest Pain/Burning with Exertion (last date of chest discomfort one week ago), DOE and Palpitations   Nuclear Pre-Procedure Caffeine/Decaff Intake:  None NPO After: 6:00pm   Lungs:  Clear IV 0.9% NS with Angio Cath:  20g  IV Site: R Antecubital  IV Started by:  Irean Hong, RN  Chest Size (in):  36 Cup Size: C  Height: 5\' 3"  (1.6 m)  Weight:  139 lb (63.05 kg)  BMI:  Body mass index is 24.62 kg/(m^2). Tech Comments:  Held atenolol x 48 hrs    Nuclear Med Study 1 or 2 day study: 1 day  Stress Test Type:  Stress  Reading MD: Charlton Haws, MD  Order Authorizing Provider:  Valera Castle, MD  Resting Radionuclide: Technetium 24m Tetrofosmin  Resting Radionuclide Dose: 11 mCi   Stress Radionuclide:  Technetium 83m Tetrofosmin  Stress Radionuclide Dose: 33 mCi           Stress Protocol Rest HR: 94 Stress HR: 164  Rest BP: 144/88 Stress BP: 185/94  Exercise Time (min): 6:01 METS: 7.2   Predicted Max HR: 151 bpm % Max HR: 108.61 bpm Rate Pressure Product: 21308   Dose of Adenosine (mg):  n/a Dose of Lexiscan: n/a mg  Dose of Atropine (mg): n/a Dose of Dobutamine: n/a mcg/kg/min (at max HR)  Stress Test Technologist: Irean Hong, RN  Nuclear Technologist:  Domenic Polite, CNMT     Rest Procedure:  Myocardial perfusion imaging was performed at rest 45 minutes following the intravenous administration of Technetium 60m Tetrofosmin. Rest ECG: NSR-ST rate 96-105 with nonspecific ST changes  Stress Procedure:  The patient exercised for 6 minutes and 01  second, RPE=15.  The patient stopped due to DOE and complained of  chest burning 1/10.  There were  nonspecific ST-T wave changes. The patient had runs of trigeminy PVC's, and couplets in recovery; rare PJC.The chest burning subsided quickly in recovery per patient. The patient was given atenolol 50mg  1/2 tablet after recovery. Technetium 16m Tetrofosmin was injected at peak exercise and myocardial perfusion imaging was performed after a brief delay. The EKG and images were reviewed by Dr.J. Hochrein with Ok for the patient to leave. Stress ECG: Baseline ECG with nonspecfic ST/T wave changes Positive ECG with stress but thought to be less diagnostic due to baseline changes  QPS Raw Data Images:  Normal; no motion artifact; normal heart/lung ratio. Stress Images:  Normal homogeneous uptake in all areas of the myocardium. Rest Images:  Normal homogeneous uptake in all areas of the myocardium. Subtraction (SDS):  Normal Transient Ischemic Dilatation (Normal <1.22):  1.07 Lung/Heart Ratio (Normal <0.45):  .23  Quantitative Gated Spect Images QGS EDV:  41 ml QGS ESV:  13 ml QGS cine images:  NL LV Function; NL Wall Motion QGS EF: 69%  Impression Exercise Capacity:  Fair exercise capacity. BP Response:  Normal blood pressure response. Clinical Symptoms:  There is dyspnea. ECG Impression:  Inferolateral ST segment changes  Comparison with Prior Nuclear Study: No previous nuclear study performed  Overall Impression:  Low risk stress nuclear study.  Positive ECG but baseline has abnormal ST segments    [employer]

## 2010-10-13 NOTE — Progress Notes (Signed)
Copy routed Dr. Daleen Squibb.Scarlette Ar

## 2010-10-19 ENCOUNTER — Telehealth: Payer: Self-pay | Admitting: Cardiology

## 2010-10-19 NOTE — Telephone Encounter (Signed)
Pt wants results of stress test °

## 2010-10-19 NOTE — Telephone Encounter (Signed)
Pt aware of stress test results.  She will see her gastroenterologist soon about her acid reflux. Mylo Red RN

## 2010-12-17 ENCOUNTER — Telehealth: Payer: Self-pay | Admitting: Internal Medicine

## 2010-12-17 ENCOUNTER — Other Ambulatory Visit: Payer: Self-pay | Admitting: Obstetrics and Gynecology

## 2010-12-17 NOTE — Telephone Encounter (Signed)
Patient has a chronic rectal "seepage" problem.  She has been taking benefiber and this has helped.  Last night she did develop a problem with mucus and excess gas.  She dos have some lower abdominal tenderness.  She is advised to continue Levsin as needed, if she develops worsening symptoms to call me back in the am.  She denies diarrhea or constipation nausea or vomiting.   She will call back in the am if her symptoms worsen.

## 2010-12-18 ENCOUNTER — Telehealth: Payer: Self-pay | Admitting: Gastroenterology

## 2010-12-18 ENCOUNTER — Other Ambulatory Visit (INDEPENDENT_AMBULATORY_CARE_PROVIDER_SITE_OTHER): Payer: Medicare Other

## 2010-12-18 ENCOUNTER — Ambulatory Visit (INDEPENDENT_AMBULATORY_CARE_PROVIDER_SITE_OTHER): Payer: Medicare Other | Admitting: Physician Assistant

## 2010-12-18 ENCOUNTER — Encounter: Payer: Self-pay | Admitting: Physician Assistant

## 2010-12-18 ENCOUNTER — Other Ambulatory Visit: Payer: Self-pay | Admitting: Gastroenterology

## 2010-12-18 ENCOUNTER — Telehealth: Payer: Self-pay | Admitting: Internal Medicine

## 2010-12-18 ENCOUNTER — Encounter: Payer: Self-pay | Admitting: Gastroenterology

## 2010-12-18 ENCOUNTER — Ambulatory Visit (HOSPITAL_COMMUNITY)
Admission: RE | Admit: 2010-12-18 | Discharge: 2010-12-18 | Disposition: A | Discharge status: Home / Self Care | Payer: Medicare Other | Source: Ambulatory Visit | Attending: Physician Assistant | Admitting: Physician Assistant

## 2010-12-18 DIAGNOSIS — K5732 Diverticulitis of large intestine without perforation or abscess without bleeding: Secondary | ICD-10-CM

## 2010-12-18 DIAGNOSIS — R1030 Lower abdominal pain, unspecified: Secondary | ICD-10-CM

## 2010-12-18 DIAGNOSIS — R109 Unspecified abdominal pain: Secondary | ICD-10-CM

## 2010-12-18 DIAGNOSIS — K921 Melena: Secondary | ICD-10-CM | POA: Insufficient documentation

## 2010-12-18 DIAGNOSIS — K559 Vascular disorder of intestine, unspecified: Secondary | ICD-10-CM

## 2010-12-18 DIAGNOSIS — K5792 Diverticulitis of intestine, part unspecified, without perforation or abscess without bleeding: Secondary | ICD-10-CM

## 2010-12-18 LAB — CBC WITH DIFFERENTIAL/PLATELET
Basophils Relative: 0.3 % (ref 0.0–3.0)
Eosinophils Absolute: 0 10*3/uL (ref 0.0–0.7)
Eosinophils Relative: 0.2 % (ref 0.0–5.0)
Hemoglobin: 12.9 g/dL (ref 12.0–15.0)
Lymphocytes Relative: 14.1 % (ref 12.0–46.0)
MCHC: 33.6 g/dL (ref 30.0–36.0)
Monocytes Relative: 10.2 % (ref 3.0–12.0)
Neutro Abs: 8.2 10*3/uL — ABNORMAL HIGH (ref 1.4–7.7)
Neutrophils Relative %: 75.2 % (ref 43.0–77.0)
RBC: 4.16 Mil/uL (ref 3.87–5.11)
WBC: 10.9 10*3/uL — ABNORMAL HIGH (ref 4.5–10.5)

## 2010-12-18 LAB — BASIC METABOLIC PANEL
BUN: 14 mg/dL (ref 6–23)
Chloride: 104 mEq/L (ref 96–112)
Creatinine, Ser: 0.7 mg/dL (ref 0.4–1.2)

## 2010-12-18 MED ORDER — METRONIDAZOLE 250 MG PO TABS: 250.0000 mg | ORAL_TABLET | Freq: Three times a day (TID) | ORAL | Status: DC

## 2010-12-18 MED ORDER — HYOSCYAMINE SULFATE ER 0.375 MG PO TB12: ORAL_TABLET | ORAL | Status: DC

## 2010-12-18 MED ORDER — IOHEXOL 300 MG/ML  SOLN
100.0000 mL | Freq: Once | INTRAMUSCULAR | Status: AC | PRN
  Administered 2010-12-18: 100 mL via INTRAVENOUS

## 2010-12-18 MED ORDER — CIPROFLOXACIN HCL 500 MG PO TABS: 500.0000 mg | ORAL_TABLET | Freq: Two times a day (BID) | ORAL | Status: DC

## 2010-12-18 NOTE — Progress Notes (Signed)
Subjective:    Patient ID: [name] [name], female    DOB: Apr 20, 1941, 70 y.o.   MRN: [id number]  HPI [name] [name] is a 70 year old female known to Dr. Leone [name] with history of diverticulosis, IBS, previous non-Hodgkin's lymphoma at the abdominal node resection. She also had a bout of mild ischemic colitis in 2005 and is status post cholecystectomy and hysterectomy. She comes in today as a work in with complaints of lower abdominal pain and blood and mucus in her stools.  She said she initially had onset about 6 days ago with lower abdominal l pains which lasted for a few hours and then subsided 3 days later on Wednesday morning she had "attack" of bad" lower abdominal pain and urgency for bowel movement. She then passed a lot  of mucus with streaks of blood. She says she had lots of gas that evening and several episodes of passage of mucus with streaks of blood but no stool. She did not develop any diaphoresis nausea vomiting fever chills etc. Yesterday she had several soft bowel movements followed by bowel movements containing mostly mucus and small streaks of blood again. She complains of soreness across her lower abdomen and into her lower back and says that she's uncomfortable with walking. She also complains of sharp "labor-like" pains with bowel movements. She says she often has pain just before a bowel movement which he has never understood. She had does use Levsin on a when necessary basis which isn't very helpful when she already has pain. Her appetite has been off over the past 3-4 days she says she generally does not feel well.  Last colonoscopy was done in 2009 with finding of sigmoid diverticulosis    Review of Systems  Constitutional: Negative.   HENT: Negative.   Eyes: Negative.   Respiratory: Negative.   Cardiovascular: Negative.   Gastrointestinal: Positive for abdominal pain and blood in stool.  Genitourinary: Negative.   Musculoskeletal: Negative.   Skin: Negative.   Neurological:  Negative.   Hematological: Negative.   Psychiatric/Behavioral: Negative.    Outpatient Prescriptions Prior to Visit  Medication Sig Dispense Refill  . aspirin 81 MG tablet Take 81 mg by mouth daily.        Marland Kitchen atenolol (TENORMIN) 25 MG tablet Take 1 tablet (25 mg total) by mouth daily.  30 tablet  5  . calcium carbonate (OS-CAL) 600 MG TABS Take 600 mg by mouth 2 (two) times daily with a meal.        . esomeprazole (NEXIUM) 40 MG capsule Take 40 mg by mouth daily before breakfast.        . estradiol (ESTRACE) 0.1 MG/GM vaginal cream Place 2 g vaginally every other day.        . fish oil-omega-3 fatty acids 1000 MG capsule Take 2 g by mouth daily.        . hyoscyamine (LEVSIN SL) 0.125 MG SL tablet Place 0.125 mg under the tongue every 4 (four) hours as needed.        . Multiple Vitamin (MULTIVITAMIN) tablet Take 1 tablet by mouth daily.        . vitamin D, CHOLECALCIFEROL, 400 UNITS tablet Take 400 Units by mouth daily.        . Wheat Dextrin (BENEFIBER) POWD Take by mouth.             Objective:   Physical Exam Well-developed white female in no acute distress, alert and oriented x3, pleasant HEENT ;nontraumatic normocephalic EOMI PERRLA sclera anicteric,  neck ;supple no JVD, cardiovascular; regular rate and rhythm with S1-S2 no murmur rub or gallop, Pulmonary; clear bilaterally, Abdomen; soft L. sounds active, she is tender bilaterally in the lower quadrants more marked in the left lower quadrant no guarding no rebound no mass or hepatosplenomegaly, Rectal; trace Hemoccult-positive no gross blood in the rectal vault. Skin; warm and dry benign, Psych; mood and affect normal and appropriate        Assessment & Plan:  #53 70 year old female with 3 day history of lower abdominal pain associated  mucoid and  blood-streaked stools. Rule out low-grade segmental ischemic colitis versus possible diverticulitis  Plan; check CBC and be met today Schedule for CT scan of the abdomen and pelvis, later  today Trial of Levbid, 1 by mouth every morning Further plans pending results of CT Keep scheduled followup with Dr. Leone [name]

## 2010-12-18 NOTE — Telephone Encounter (Signed)
Blood and mucus in stool this am, lower abdominal pain and cramping.  Hurts to ambulate.  She will come in and see Amy Esterwood PA at 10:00 this am

## 2010-12-18 NOTE — Patient Instructions (Signed)
  Go to the lab on our basement level.  We sent a prescription to your pharmacy for Levbid, for cramping and spasms.  We scheduled the CT scan at [facility] for today at 4:30 PM.  You will need a driver. Take the Benedryl, 50 mg one hour ahead of time.  You need to arrive at 4:15 PM.  Drink the first bottle of contrast at 2:30 PM and the 2nd bottle at 3:30 PM.   We will

## 2010-12-18 NOTE — Progress Notes (Signed)
  Called by radiology about CT scan which shows changes c/w acute diverticulitis. Will call pt and begin cipro and flagyl.  Pt will be instructed to c/b on Mon to report progress, sooner as necessary.

## 2010-12-18 NOTE — Telephone Encounter (Signed)
Spoke with pt and explained CT findings showing acute diverticulitis.  Cipro and flagyl called in.  Pt was instructed to call office early next week to report progress, sooner if not improving.

## 2010-12-20 NOTE — Telephone Encounter (Signed)
Needs an REV in September to follow-up diverticulitis

## 2010-12-21 ENCOUNTER — Other Ambulatory Visit: Payer: Self-pay | Admitting: Gastroenterology

## 2010-12-21 ENCOUNTER — Encounter: Payer: Self-pay | Admitting: Gastroenterology

## 2010-12-21 MED ORDER — ESOMEPRAZOLE MAGNESIUM 40 MG PO CPDR: 40.0000 mg | DELAYED_RELEASE_CAPSULE | Freq: Every day | ORAL | Status: DC

## 2010-12-21 NOTE — Telephone Encounter (Signed)
She reports she is doing much better.  She reports that she still has some lower quad tenderness when she walks, but the bloody diarrhea has resolved.  She felt well enough to go to work this am.  I have scheduled her for an REV with Dr Leone Payor for 01/08/11.  She will call back if her symptoms don't completely resolve.

## 2010-12-21 NOTE — Telephone Encounter (Signed)
Medication refilled

## 2010-12-21 NOTE — Telephone Encounter (Signed)
error 

## 2010-12-21 NOTE — Progress Notes (Signed)
i AGREE.Marland KitchenMarland Kitchen

## 2011-01-08 ENCOUNTER — Ambulatory Visit (INDEPENDENT_AMBULATORY_CARE_PROVIDER_SITE_OTHER): Payer: Medicare Other | Admitting: Internal Medicine

## 2011-01-08 ENCOUNTER — Encounter: Payer: Self-pay | Admitting: Internal Medicine

## 2011-01-08 DIAGNOSIS — K5730 Diverticulosis of large intestine without perforation or abscess without bleeding: Secondary | ICD-10-CM

## 2011-01-08 DIAGNOSIS — R079 Chest pain, unspecified: Secondary | ICD-10-CM

## 2011-01-08 DIAGNOSIS — R159 Full incontinence of feces: Secondary | ICD-10-CM

## 2011-01-08 DIAGNOSIS — K3189 Other diseases of stomach and duodenum: Secondary | ICD-10-CM

## 2011-01-08 DIAGNOSIS — K589 Irritable bowel syndrome without diarrhea: Secondary | ICD-10-CM

## 2011-01-08 DIAGNOSIS — K5732 Diverticulitis of large intestine without perforation or abscess without bleeding: Secondary | ICD-10-CM

## 2011-01-08 NOTE — Progress Notes (Signed)
  Subjective:    Patient ID: [name] [name], female    DOB: Mar 28, 1941, 70 y.o.   MRN: [id number]  HPI she was seen and evaluated by our physician assistant earlier in the month. Actually in August. She had severe left lower quadrant pain and was found to have diverticulitis in the left colon. It was treated with Cipro and metronidazole with an excellent result and there is no more pain. She was also started on Levbid. She is using that once a day and has beautiful control of her diarrhea and fecal incontinence and abdominal cramps. However she does sometimes get dry mouth later in the day.  She is still complaining of exertional chest pain that occurs while walking up an incline. She did not have this until this summer. She saw her cardiologist and had a stress test which was okay. He thought it was probably GERD. She has been on Nexium for many years and wonders if that's losing its efficacy though she never really had exertional-type burning chest pain which she does now with her GERD.    Review of Systems Some fatigue, otherwise as above    Objective:   Physical Exam  well-nourished no acute distress Eyes anicteric Abdomen is soft and nontender without mass       Assessment & Plan:

## 2011-01-08 NOTE — Assessment & Plan Note (Signed)
This sounds cardiac to me but suppose it could be GERD. Trial of antacids whne it occurs (only when walking on an incline - started this summer).

## 2011-01-08 NOTE — Assessment & Plan Note (Signed)
On chronic PPI - I do not think exertional chest pain is GERD. Try prn antacids with chest pain while walking If helps continue If does not help think repeat cardiology eval needed

## 2011-01-08 NOTE — Assessment & Plan Note (Signed)
Resolved on hyoscyamine - will continue

## 2011-01-08 NOTE — Patient Instructions (Addendum)
Try Antiacids when getting burning chest pains.  Call back with an update on your progress in about 2 weeks. Take 1/2 tablet of the Levbid every morning to see how this helps. If you get persistant chest pains please call 911. Diverticulosis/Diverticulitis handout given for you to read.

## 2011-01-08 NOTE — Assessment & Plan Note (Addendum)
Doing very well on 1 hyoscyamine daily but some dry mouth - to try 12/tablet regularly

## 2011-01-08 NOTE — Assessment & Plan Note (Signed)
Resolved. Monitor for recurrence. 

## 2011-01-10 ENCOUNTER — Encounter: Payer: Self-pay | Admitting: Internal Medicine

## 2011-01-20 ENCOUNTER — Telehealth: Payer: Self-pay | Admitting: Cardiology

## 2011-01-20 DIAGNOSIS — I10 Essential (primary) hypertension: Secondary | ICD-10-CM

## 2011-01-20 NOTE — Telephone Encounter (Signed)
Pt needs atenolol 25mg  qd called into [facility] in [place] [phone] and per pt this has been requested by Pharm twice already

## 2011-01-21 MED ORDER — ATENOLOL 25 MG PO TABS: 25.0000 mg | ORAL_TABLET | Freq: Every day | ORAL | Status: DC

## 2011-01-21 NOTE — Telephone Encounter (Signed)
LMOVM prescription called in to pharmacy Mylo Red RN

## 2011-02-05 ENCOUNTER — Telehealth: Payer: Self-pay | Admitting: Internal Medicine

## 2011-02-05 DIAGNOSIS — R079 Chest pain, unspecified: Secondary | ICD-10-CM

## 2011-02-05 DIAGNOSIS — R1013 Epigastric pain: Secondary | ICD-10-CM

## 2011-02-05 MED ORDER — PANTOPRAZOLE SODIUM 40 MG PO TBEC: 40.0000 mg | DELAYED_RELEASE_TABLET | Freq: Every day | ORAL | Status: DC

## 2011-02-05 NOTE — Telephone Encounter (Signed)
She says Nexium now too expensive. Will change to pantoprazole 40 mg daily. Exertional chest pain is "much better".

## 2011-02-05 NOTE — Assessment & Plan Note (Signed)
Nexium not on formulary. Try pantoprazole 40 mg

## 2011-02-05 NOTE — Assessment & Plan Note (Signed)
She tells me this is much better now. Advised return to cardiology if recurs, worsens.

## 2011-02-11 ENCOUNTER — Ambulatory Visit: Payer: Medicare Other | Admitting: Internal Medicine

## 2011-02-12 LAB — CREATININE, SERUM
Creatinine, Ser: 0.69
GFR calc non Af Amer: >60

## 2011-02-12 LAB — BUN: BUN: 13

## 2011-02-14 ENCOUNTER — Other Ambulatory Visit: Payer: Self-pay | Admitting: Physician Assistant

## 2011-02-15 ENCOUNTER — Other Ambulatory Visit: Payer: Self-pay

## 2011-03-03 ENCOUNTER — Encounter (HOSPITAL_BASED_OUTPATIENT_CLINIC_OR_DEPARTMENT_OTHER): Payer: Medicare Other | Admitting: Oncology

## 2011-03-03 ENCOUNTER — Other Ambulatory Visit: Payer: Self-pay | Admitting: Oncology

## 2011-03-03 DIAGNOSIS — R599 Enlarged lymph nodes, unspecified: Secondary | ICD-10-CM

## 2011-03-03 DIAGNOSIS — R229 Localized swelling, mass and lump, unspecified: Secondary | ICD-10-CM

## 2011-03-03 DIAGNOSIS — Z87898 Personal history of other specified conditions: Secondary | ICD-10-CM

## 2011-03-03 DIAGNOSIS — C8589 Other specified types of non-Hodgkin lymphoma, extranodal and solid organ sites: Secondary | ICD-10-CM

## 2011-03-03 LAB — CBC WITH DIFFERENTIAL/PLATELET
Basophils Absolute: 0 10*3/uL (ref 0.0–0.1)
Eosinophils Absolute: 0.1 10*3/uL (ref 0.0–0.5)
HCT: 39.2 % (ref 34.8–46.6)
HGB: 13.3 g/dL (ref 11.6–15.9)
MCH: 30.9 pg (ref 25.1–34.0)
MONO#: 0.7 10*3/uL (ref 0.1–0.9)
NEUT#: 2.9 10*3/uL (ref 1.5–6.5)
NEUT%: 53.5 % (ref 38.4–76.8)
RDW: 13.4 % (ref 11.2–14.5)
lymph#: 1.7 10*3/uL (ref 0.9–3.3)

## 2011-03-03 LAB — COMPREHENSIVE METABOLIC PANEL
AST: 24 U/L (ref 0–37)
Albumin: 4.5 g/dL (ref 3.5–5.2)
BUN: 16 mg/dL (ref 6–23)
CO2: 28 mEq/L (ref 19–32)
Calcium: 10.6 mg/dL — ABNORMAL HIGH (ref 8.4–10.5)
Chloride: 104 mEq/L (ref 96–112)
Creatinine, Ser: 0.82 mg/dL (ref 0.50–1.10)
Glucose, Bld: 76 mg/dL (ref 70–99)
Potassium: 4.5 mEq/L (ref 3.5–5.3)

## 2011-03-03 LAB — LACTATE DEHYDROGENASE: LDH: 178 U/L (ref 94–250)

## 2011-03-04 ENCOUNTER — Telehealth: Payer: Self-pay | Admitting: *Deleted

## 2011-03-10 NOTE — Progress Notes (Signed)
This encounter was created in error - please disregard.

## 2011-03-11 ENCOUNTER — Other Ambulatory Visit: Payer: Self-pay | Admitting: Oncology

## 2011-03-11 ENCOUNTER — Ambulatory Visit (HOSPITAL_COMMUNITY)
Admission: RE | Admit: 2011-03-11 | Discharge: 2011-03-11 | Disposition: A | Discharge status: Home / Self Care | Payer: Medicare Other | Source: Ambulatory Visit | Attending: Oncology | Admitting: Oncology

## 2011-03-11 ENCOUNTER — Encounter: Payer: Self-pay | Admitting: Oncology

## 2011-03-11 DIAGNOSIS — R599 Enlarged lymph nodes, unspecified: Secondary | ICD-10-CM

## 2011-03-11 DIAGNOSIS — R229 Localized swelling, mass and lump, unspecified: Secondary | ICD-10-CM | POA: Insufficient documentation

## 2011-03-19 ENCOUNTER — Telehealth: Payer: Self-pay | Admitting: *Deleted

## 2011-03-19 NOTE — Telephone Encounter (Signed)
Pt called with questions regarding her 03/03/11 right arm ultrasound.  Pt would like Dr Donnie Coffin to call her and explain the findings.  MD notified

## 2011-03-21 NOTE — Telephone Encounter (Signed)
D/w ms Reitman on 11/18.. Reassured him.. She ishaving pain in that arm and will let me know if anything else changes  Pierce Crane

## 2011-03-30 ENCOUNTER — Other Ambulatory Visit: Payer: Self-pay | Admitting: Physician Assistant

## 2011-05-13 DIAGNOSIS — H524 Presbyopia: Secondary | ICD-10-CM | POA: Diagnosis not present

## 2011-05-13 DIAGNOSIS — H5210 Myopia, unspecified eye: Secondary | ICD-10-CM | POA: Diagnosis not present

## 2011-05-13 DIAGNOSIS — H40019 Open angle with borderline findings, low risk, unspecified eye: Secondary | ICD-10-CM | POA: Diagnosis not present

## 2011-06-09 DIAGNOSIS — H40019 Open angle with borderline findings, low risk, unspecified eye: Secondary | ICD-10-CM | POA: Diagnosis not present

## 2011-06-17 DIAGNOSIS — M810 Age-related osteoporosis without current pathological fracture: Secondary | ICD-10-CM | POA: Diagnosis not present

## 2011-06-17 DIAGNOSIS — M159 Polyosteoarthritis, unspecified: Secondary | ICD-10-CM | POA: Diagnosis not present

## 2011-06-25 DIAGNOSIS — H43819 Vitreous degeneration, unspecified eye: Secondary | ICD-10-CM | POA: Diagnosis not present

## 2011-06-25 DIAGNOSIS — H43829 Vitreomacular adhesion, unspecified eye: Secondary | ICD-10-CM | POA: Diagnosis not present

## 2011-06-25 DIAGNOSIS — H3581 Retinal edema: Secondary | ICD-10-CM | POA: Diagnosis not present

## 2011-06-25 DIAGNOSIS — H2510 Age-related nuclear cataract, unspecified eye: Secondary | ICD-10-CM | POA: Diagnosis not present

## 2011-07-12 ENCOUNTER — Telehealth: Payer: Self-pay | Admitting: Internal Medicine

## 2011-07-12 NOTE — Telephone Encounter (Signed)
Patient having lower abdominal pain.  She will come in and see Dr Leone Payor tomorrow at 2:45

## 2011-07-13 ENCOUNTER — Encounter: Payer: Self-pay | Admitting: Internal Medicine

## 2011-07-13 ENCOUNTER — Ambulatory Visit (INDEPENDENT_AMBULATORY_CARE_PROVIDER_SITE_OTHER): Payer: Medicare Other | Admitting: Internal Medicine

## 2011-07-13 ENCOUNTER — Other Ambulatory Visit: Payer: Self-pay | Admitting: Oncology

## 2011-07-13 VITALS — BP 112/66 | HR 72 | Ht 63.5 in | Wt 136.4 lb

## 2011-07-13 DIAGNOSIS — K589 Irritable bowel syndrome without diarrhea: Secondary | ICD-10-CM | POA: Diagnosis not present

## 2011-07-13 DIAGNOSIS — K5732 Diverticulitis of large intestine without perforation or abscess without bleeding: Secondary | ICD-10-CM

## 2011-07-13 DIAGNOSIS — Z1231 Encounter for screening mammogram for malignant neoplasm of breast: Secondary | ICD-10-CM

## 2011-07-13 DIAGNOSIS — Z87898 Personal history of other specified conditions: Secondary | ICD-10-CM | POA: Diagnosis not present

## 2011-07-13 MED ORDER — METRONIDAZOLE 250 MG PO TABS: 250.0000 mg | ORAL_TABLET | Freq: Three times a day (TID) | ORAL | Status: AC

## 2011-07-13 MED ORDER — CIPROFLOXACIN HCL 500 MG PO TABS: 500.0000 mg | ORAL_TABLET | Freq: Two times a day (BID) | ORAL | Status: AC

## 2011-07-13 NOTE — Patient Instructions (Addendum)
We have sent the following medications to your pharmacy for you to pick up at your convenience: Cipro and Flagyl. You have been given a low fiber diet to stay on for 2 weeks.  Gradually resume to a regular diet. Call back in 3 - 4 weeks with an update on your symptoms or sooner if needed.

## 2011-07-13 NOTE — Progress Notes (Signed)
  Subjective:    Patient ID: [name] [name], female    DOB: December 24, 1940, 71 y.o.   MRN: [id number]  HPI This 71 year old white woman presents with a two-day history of increasing left lower quadrant abdominal pain. She was last seen in August of 2012 and had CT confirmed sigmoid diverticulitis. That responded well to Cipro and metronidazole for 10 days. She reports that she started to feel some pressure in her left lower quadrant with increasing sharper pains and twinges it became worse over 48 hours into today. She had some pellet-like or ball-like stool this morning associated with a small amount of mucoid bloody discharge and then had a more normal bowel movement with a slight streak of blood in it later today. She has not had fever. She does report nausea or vomiting. This is very similar to her previous episode of diverticulitis in August 2012, she tells me. There was no antecedent significant change in bowel habit.  Medications, allergies, past medical and surgical history and social history are reviewed and updated in the electronic medical record. Review of Systems As above    Objective:   Physical Exam General:  NAD Eyes:   anicteric Lungs:  clear Heart:  S1S2 no rubs, murmurs or gallops Abdomen:  soft and  BS+, she has moderate tenderness in the left lower quadrant with a small amount of guarding. There is no rebound or other peritoneal signs. Ext:   no edema    Data Reviewed:   August 2012 CT, 2009 colonoscopy showing severe diverticulosis and normal random biopsies.        Assessment & Plan:  Please see problem oriented charting. She has recurrent diverticulitis and will be treated with Cipro and metronidazole.  Will copy LUKING,WILLIAM STEPHEN

## 2011-07-13 NOTE — Assessment & Plan Note (Addendum)
Recurrent signs and symptoms consistent with this - retreat with cipro/metronidazole x 10 days and she is to call us in 1 month to let me know she is ok - sooner if not improving/improved. Low-residue diet for 2 weeks.

## 2011-07-13 NOTE — Assessment & Plan Note (Signed)
This appears to be controlled on hyoscyamine.

## 2011-07-13 NOTE — Assessment & Plan Note (Signed)
Noted, not aware of any significant link between something like this and her diverticulitis and the CT scan in August demonstrated diverticulitis. Will not reimage this time. Should she have recurrent problems need to consider sigmoidoscopy and or CT scan and depending upon the overall situation.

## 2011-07-23 DIAGNOSIS — H43819 Vitreous degeneration, unspecified eye: Secondary | ICD-10-CM | POA: Diagnosis not present

## 2011-07-23 DIAGNOSIS — H2510 Age-related nuclear cataract, unspecified eye: Secondary | ICD-10-CM | POA: Diagnosis not present

## 2011-07-23 DIAGNOSIS — H3581 Retinal edema: Secondary | ICD-10-CM | POA: Diagnosis not present

## 2011-07-23 DIAGNOSIS — H43829 Vitreomacular adhesion, unspecified eye: Secondary | ICD-10-CM | POA: Diagnosis not present

## 2011-08-10 DIAGNOSIS — J019 Acute sinusitis, unspecified: Secondary | ICD-10-CM | POA: Diagnosis not present

## 2011-08-27 DIAGNOSIS — H43829 Vitreomacular adhesion, unspecified eye: Secondary | ICD-10-CM | POA: Diagnosis not present

## 2011-08-27 DIAGNOSIS — H2510 Age-related nuclear cataract, unspecified eye: Secondary | ICD-10-CM | POA: Diagnosis not present

## 2011-08-27 DIAGNOSIS — H43819 Vitreous degeneration, unspecified eye: Secondary | ICD-10-CM | POA: Diagnosis not present

## 2011-08-27 DIAGNOSIS — H3581 Retinal edema: Secondary | ICD-10-CM | POA: Diagnosis not present

## 2011-09-03 ENCOUNTER — Ambulatory Visit
Admission: RE | Admit: 2011-09-03 | Discharge: 2011-09-03 | Disposition: A | Discharge status: Home / Self Care | Payer: Medicare Other | Source: Ambulatory Visit | Attending: Oncology | Admitting: Oncology

## 2011-09-03 ENCOUNTER — Encounter: Payer: Self-pay | Admitting: *Deleted

## 2011-09-03 DIAGNOSIS — Z1231 Encounter for screening mammogram for malignant neoplasm of breast: Secondary | ICD-10-CM | POA: Diagnosis not present

## 2011-09-10 DIAGNOSIS — H43829 Vitreomacular adhesion, unspecified eye: Secondary | ICD-10-CM | POA: Diagnosis not present

## 2011-09-13 DIAGNOSIS — H43829 Vitreomacular adhesion, unspecified eye: Secondary | ICD-10-CM | POA: Diagnosis not present

## 2011-09-15 ENCOUNTER — Encounter: Payer: Self-pay | Admitting: Cardiology

## 2011-09-15 ENCOUNTER — Ambulatory Visit (INDEPENDENT_AMBULATORY_CARE_PROVIDER_SITE_OTHER): Payer: Medicare Other | Admitting: Cardiology

## 2011-09-15 VITALS — BP 114/80 | HR 60 | Ht 63.5 in | Wt 134.0 lb

## 2011-09-15 DIAGNOSIS — I10 Essential (primary) hypertension: Secondary | ICD-10-CM

## 2011-09-15 DIAGNOSIS — I495 Sick sinus syndrome: Secondary | ICD-10-CM | POA: Diagnosis not present

## 2011-09-15 DIAGNOSIS — R079 Chest pain, unspecified: Secondary | ICD-10-CM | POA: Diagnosis not present

## 2011-09-15 DIAGNOSIS — I059 Rheumatic mitral valve disease, unspecified: Secondary | ICD-10-CM

## 2011-09-15 MED ORDER — ATENOLOL 25 MG PO TABS: 25.0000 mg | ORAL_TABLET | Freq: Every day | ORAL | Status: DC

## 2011-09-15 NOTE — Progress Notes (Signed)
HPI [name] [name] returns today for evaluation and management of her mitral valve prolapse, atypical chest pain, palpitations.  She's had a good year. Stress evaluation last year was unremarkable. He continues to have intermittent chest burning when she walks a mile. Other activities do not bring this time.  She's had no further vasovagal events and no presyncope.  Past Medical History  Diagnosis Date  . nhl dx'd 07/1999    chemo comp 2001; rituxin comp 2005  . MVP (mitral valve prolapse)   . Brady-tachy syndrome   . HLD (hyperlipidemia)   . Palpitation   . Dyspepsia   . Vasovagal syncope   . Colitis, ischemic   . Fatty liver   . Osteopenia   . Diverticulosis of colon   . IBS (irritable bowel syndrome)   . Incontinence, feces   . Rectocele   . Hypertension   . Diverticulitis   . Rectocele     Current Outpatient Prescriptions  Medication Sig Dispense Refill  . aspirin 81 MG tablet Take 81 mg by mouth daily.        Marland Kitchen atenolol (TENORMIN) 25 MG tablet Take 1 tablet (25 mg total) by mouth daily.  30 tablet  11  . calcium carbonate (OS-CAL) 600 MG TABS Take 600 mg by mouth 2 (two) times daily with a meal.        . Docusate Calcium (STOOL SOFTENER PO) Take 1 capsule by mouth daily.        Marland Kitchen estradiol (ESTRACE) 0.1 MG/GM vaginal cream Place 2 g vaginally every other day.        . fish oil-omega-3 fatty acids 1000 MG capsule Take 2 g by mouth daily.        . hyoscyamine (LEVBID) 0.375 MG 12 hr tablet TAKE 1 TABLET IN THE MORNING AND YOU CAN TAKE A SECOND DOSE LATER IN THE DAY IF NEEDED  30 tablet  1  . hyoscyamine (LEVSIN SL) 0.125 MG SL tablet Place 0.125 mg under the tongue every 4 (four) hours as needed.        . Multiple Vitamin (MULTIVITAMIN) tablet Take 1 tablet by mouth daily.        . pantoprazole (PROTONIX) 40 MG tablet Take 1 tablet (40 mg total) by mouth daily.  30 tablet  11  . vitamin D, CHOLECALCIFEROL, 400 UNITS tablet Take 400 Units by mouth daily.        . Wheat  Dextrin (BENEFIBER) POWD Take by mouth.          Allergies  Allergen Reactions  . Codeine   . Iodine   . Iohexol      Desc: 50 mg benadryl prior to exam   . Pamine Forte (Methscopolamine)   . Robinul (Glycopyrrolate)   . Sulfa Antibiotics   . Sulfonamide Derivatives     Family History  Problem Relation Age of Onset  . Colon cancer Father   . Prostate cancer Brother   . Colon polyps Brother   . Colon polyps Brother     History   Social History  . Marital Status: Married    Spouse Name: N/A    Number of Children: 1  . Years of Education: N/A   Occupational History  . Retired   .     Social History Main Topics  . Smoking status: Former Games developer  . Smokeless tobacco: Never Used   Comment: quit 1976  . Alcohol Use: No  . Drug Use: No  . Sexually Active: Not on  file   Other Topics Concern  . Not on file   Social History Narrative  . No narrative on file    ROS ALL NEGATIVE EXCEPT THOSE NOTED IN HPI  PE  General Appearance: well developed, well nourished in no acute distress HEENT: symmetrical face, PERRLA, good dentition  Neck: no JVD, thyromegaly, or adenopathy, trachea midline Chest: symmetric without deformity Cardiac: PMI non-displaced, RRR, normal S1, S2, no gallop or murmur, no click Lung: clear to ausculation and percussion Vascular: all pulses full without bruits  Abdominal: nondistended, nontender, good bowel sounds, no HSM, no bruits Extremities: no cyanosis, clubbing or edema, no sign of DVT, no varicosities  Skin: normal color, no rashes Neuro: alert and oriented x 3, non-focal Pysch: normal affect  EKG Normal sinus rhythm, normal EKG BMET    Component Value Date/Time   NA 141 03/03/2011 1058   K 4.5 03/03/2011 1058   CL 104 03/03/2011 1058   CO2 28 03/03/2011 1058   GLUCOSE 76 03/03/2011 1058   BUN 16 03/03/2011 1058   CREATININE 0.82 03/03/2011 1058   CALCIUM 10.6* 03/03/2011 1058   GFRNONAA 88.54 08/29/2008 0000   GFRAA  Value:  >60        The eGFR has been calculated using the MDRD equation. This calculation has not been validated in all clinical 01/13/2007 0657    Lipid Panel  No results found for this basename: chol, trig, hdl, cholhdl, vldl, ldlcalc    CBC    Component Value Date/Time   WBC 5.4 03/03/2011 1058   WBC 10.9* 12/18/2010 1138   RBC 4.30 03/03/2011 1058   RBC 4.16 12/18/2010 1138   HGB 13.3 03/03/2011 1058   HGB 12.9 12/18/2010 1138   HCT 39.2 03/03/2011 1058   HCT 38.5 12/18/2010 1138   PLT 239 03/03/2011 1058   PLT 251.0 12/18/2010 1138   MCV 91.0 03/03/2011 1058   MCV 92.5 12/18/2010 1138   MCH 30.9 03/03/2011 1058   MCHC 34.0 03/03/2011 1058   MCHC 33.6 12/18/2010 1138   RDW 13.4 03/03/2011 1058   RDW 13.3 12/18/2010 1138   LYMPHSABS 1.7 03/03/2011 1058   LYMPHSABS 1.5 12/18/2010 1138   MONOABS 0.7 03/03/2011 1058   MONOABS 1.1* 12/18/2010 1138   EOSABS 0.1 03/03/2011 1058   EOSABS 0.0 12/18/2010 1138   BASOSABS 0.0 03/03/2011 1058   BASOSABS 0.0 12/18/2010 1138

## 2011-09-15 NOTE — Progress Notes (Signed)
Addended by: Lisabeth Devoid F on: 09/15/2011 11:53 AM   Modules accepted: Orders

## 2011-09-15 NOTE — Patient Instructions (Signed)
Your physician recommends that you continue on your current medications as directed. Please refer to the Current Medication list given to you today.  Your physician wants you to follow-up in: 1 year. You will receive a reminder letter in the mail two months in advance. If you don't receive a letter, please call our office to schedule the follow-up appointment.  

## 2011-09-15 NOTE — Assessment & Plan Note (Signed)
Stable by history and physical examination. No change in treatment.

## 2011-09-15 NOTE — Assessment & Plan Note (Signed)
Unchanged. I doubt this is angina. No change in treatment.

## 2011-09-17 DIAGNOSIS — H2510 Age-related nuclear cataract, unspecified eye: Secondary | ICD-10-CM | POA: Diagnosis not present

## 2011-09-17 DIAGNOSIS — H43819 Vitreous degeneration, unspecified eye: Secondary | ICD-10-CM | POA: Diagnosis not present

## 2011-09-17 DIAGNOSIS — H43829 Vitreomacular adhesion, unspecified eye: Secondary | ICD-10-CM | POA: Diagnosis not present

## 2011-09-21 DIAGNOSIS — J309 Allergic rhinitis, unspecified: Secondary | ICD-10-CM | POA: Diagnosis not present

## 2011-09-21 DIAGNOSIS — J029 Acute pharyngitis, unspecified: Secondary | ICD-10-CM | POA: Diagnosis not present

## 2011-10-07 DIAGNOSIS — L821 Other seborrheic keratosis: Secondary | ICD-10-CM | POA: Diagnosis not present

## 2011-10-07 DIAGNOSIS — D1801 Hemangioma of skin and subcutaneous tissue: Secondary | ICD-10-CM | POA: Diagnosis not present

## 2011-10-07 DIAGNOSIS — D239 Other benign neoplasm of skin, unspecified: Secondary | ICD-10-CM | POA: Diagnosis not present

## 2011-10-07 DIAGNOSIS — L819 Disorder of pigmentation, unspecified: Secondary | ICD-10-CM | POA: Diagnosis not present

## 2011-10-19 ENCOUNTER — Telehealth: Payer: Self-pay | Admitting: Oncology

## 2011-10-19 ENCOUNTER — Other Ambulatory Visit: Payer: Self-pay | Admitting: Oncology

## 2011-10-19 DIAGNOSIS — C50919 Malignant neoplasm of unspecified site of unspecified female breast: Secondary | ICD-10-CM

## 2011-10-19 NOTE — Telephone Encounter (Signed)
Pt called today re appt for ct due prior to oct 2013 f/u w/PR. Per pt originally she would have been seen for f/u may 2013 but ended up having an appt nov 2012 and after the nov 2012 f/u was given her next f/u for oct 2013. Per pt the orders given nov 2012 for and oct 2013 appt would have overriden the appts that should have been scheduled may 2013 and the orders for may 2013 had scans that will need to be done prior to oct f/u. Copy of 09/03/2010 pof given to Jacki Cones to schedule ct prior to oct 2013 f/u. oct lb to be done same day as ct. Pt aware scheduler will call w/lb/ct appt for end of sept and f/u will remain 10/1. Pt requests lb/ct be done on a Thursday morning. Desk nurse aware.

## 2011-10-22 DIAGNOSIS — H2510 Age-related nuclear cataract, unspecified eye: Secondary | ICD-10-CM | POA: Diagnosis not present

## 2011-10-22 DIAGNOSIS — H3581 Retinal edema: Secondary | ICD-10-CM | POA: Diagnosis not present

## 2011-10-22 DIAGNOSIS — H43829 Vitreomacular adhesion, unspecified eye: Secondary | ICD-10-CM | POA: Diagnosis not present

## 2011-10-22 DIAGNOSIS — H43819 Vitreous degeneration, unspecified eye: Secondary | ICD-10-CM | POA: Diagnosis not present

## 2011-11-11 DIAGNOSIS — H35349 Macular cyst, hole, or pseudohole, unspecified eye: Secondary | ICD-10-CM | POA: Diagnosis not present

## 2011-11-17 ENCOUNTER — Other Ambulatory Visit: Payer: Self-pay | Admitting: Cardiology

## 2011-11-17 DIAGNOSIS — N390 Urinary tract infection, site not specified: Secondary | ICD-10-CM | POA: Diagnosis not present

## 2011-11-25 DIAGNOSIS — H4389 Other disorders of vitreous body: Secondary | ICD-10-CM | POA: Diagnosis not present

## 2011-11-25 DIAGNOSIS — H269 Unspecified cataract: Secondary | ICD-10-CM | POA: Diagnosis not present

## 2011-11-25 DIAGNOSIS — H35349 Macular cyst, hole, or pseudohole, unspecified eye: Secondary | ICD-10-CM | POA: Diagnosis not present

## 2011-11-25 DIAGNOSIS — H43829 Vitreomacular adhesion, unspecified eye: Secondary | ICD-10-CM | POA: Diagnosis not present

## 2011-12-08 DIAGNOSIS — H43819 Vitreous degeneration, unspecified eye: Secondary | ICD-10-CM | POA: Diagnosis not present

## 2011-12-16 ENCOUNTER — Other Ambulatory Visit: Payer: Self-pay | Admitting: Internal Medicine

## 2011-12-16 DIAGNOSIS — M810 Age-related osteoporosis without current pathological fracture: Secondary | ICD-10-CM | POA: Diagnosis not present

## 2011-12-16 DIAGNOSIS — M159 Polyosteoarthritis, unspecified: Secondary | ICD-10-CM | POA: Diagnosis not present

## 2012-01-13 DIAGNOSIS — H43819 Vitreous degeneration, unspecified eye: Secondary | ICD-10-CM | POA: Diagnosis not present

## 2012-01-20 ENCOUNTER — Other Ambulatory Visit (HOSPITAL_BASED_OUTPATIENT_CLINIC_OR_DEPARTMENT_OTHER): Payer: Medicare Other

## 2012-01-20 ENCOUNTER — Other Ambulatory Visit: Payer: Self-pay | Admitting: Emergency Medicine

## 2012-01-20 DIAGNOSIS — C8590 Non-Hodgkin lymphoma, unspecified, unspecified site: Secondary | ICD-10-CM

## 2012-01-20 DIAGNOSIS — C8589 Other specified types of non-Hodgkin lymphoma, extranodal and solid organ sites: Secondary | ICD-10-CM

## 2012-01-20 LAB — CBC WITH DIFFERENTIAL/PLATELET
EOS%: 2.6 % (ref 0.0–7.0)
Eosinophils Absolute: 0.2 10*3/uL (ref 0.0–0.5)
HGB: 14 g/dL (ref 11.6–15.9)
MCV: 91.1 fL (ref 79.5–101.0)
MONO%: 13.5 % (ref 0.0–14.0)
NEUT#: 3.2 10*3/uL (ref 1.5–6.5)
RBC: 4.48 10*6/uL (ref 3.70–5.45)
RDW: 13.3 % (ref 11.2–14.5)
lymph#: 1.8 10*3/uL (ref 0.9–3.3)

## 2012-01-20 LAB — COMPREHENSIVE METABOLIC PANEL (CC13)
AST: 22 U/L (ref 5–34)
Albumin: 4 g/dL (ref 3.5–5.0)
Alkaline Phosphatase: 77 U/L (ref 40–150)
Calcium: 10.6 mg/dL — ABNORMAL HIGH (ref 8.4–10.4)
Chloride: 105 mEq/L (ref 98–107)
Glucose: 91 mg/dl (ref 70–99)
Potassium: 4.7 mEq/L (ref 3.5–5.1)
Sodium: 142 mEq/L (ref 136–145)
Total Protein: 6.6 g/dL (ref 6.4–8.3)

## 2012-01-21 ENCOUNTER — Encounter (HOSPITAL_COMMUNITY): Payer: Self-pay | Admitting: *Deleted

## 2012-01-21 ENCOUNTER — Emergency Department (HOSPITAL_COMMUNITY)
Admission: EM | Admit: 2012-01-21 | Discharge: 2012-01-21 | Disposition: A | Discharge status: Home / Self Care | Payer: Medicare Other | Attending: Emergency Medicine | Admitting: Emergency Medicine

## 2012-01-21 ENCOUNTER — Ambulatory Visit (HOSPITAL_COMMUNITY)
Admission: RE | Admit: 2012-01-21 | Discharge: 2012-01-21 | Disposition: A | Discharge status: Home / Self Care | Payer: Medicare Other | Source: Ambulatory Visit | Attending: Oncology | Admitting: Oncology

## 2012-01-21 ENCOUNTER — Emergency Department (HOSPITAL_COMMUNITY): Payer: Medicare Other

## 2012-01-21 DIAGNOSIS — M47817 Spondylosis without myelopathy or radiculopathy, lumbosacral region: Secondary | ICD-10-CM | POA: Insufficient documentation

## 2012-01-21 DIAGNOSIS — I10 Essential (primary) hypertension: Secondary | ICD-10-CM | POA: Diagnosis not present

## 2012-01-21 DIAGNOSIS — C50919 Malignant neoplasm of unspecified site of unspecified female breast: Secondary | ICD-10-CM

## 2012-01-21 DIAGNOSIS — Z90710 Acquired absence of both cervix and uterus: Secondary | ICD-10-CM | POA: Diagnosis not present

## 2012-01-21 DIAGNOSIS — Z9221 Personal history of antineoplastic chemotherapy: Secondary | ICD-10-CM | POA: Insufficient documentation

## 2012-01-21 DIAGNOSIS — C8589 Other specified types of non-Hodgkin lymphoma, extranodal and solid organ sites: Secondary | ICD-10-CM | POA: Insufficient documentation

## 2012-01-21 DIAGNOSIS — Z9089 Acquired absence of other organs: Secondary | ICD-10-CM | POA: Diagnosis not present

## 2012-01-21 DIAGNOSIS — M25569 Pain in unspecified knee: Secondary | ICD-10-CM | POA: Insufficient documentation

## 2012-01-21 DIAGNOSIS — Z7982 Long term (current) use of aspirin: Secondary | ICD-10-CM | POA: Insufficient documentation

## 2012-01-21 DIAGNOSIS — K5730 Diverticulosis of large intestine without perforation or abscess without bleeding: Secondary | ICD-10-CM | POA: Insufficient documentation

## 2012-01-21 DIAGNOSIS — M25561 Pain in right knee: Secondary | ICD-10-CM

## 2012-01-21 MED ORDER — HYDROCODONE-ACETAMINOPHEN 5-500 MG PO TABS: 1.0000 | ORAL_TABLET | Freq: Four times a day (QID) | ORAL | Status: DC | PRN

## 2012-01-21 MED ORDER — IOHEXOL 300 MG/ML  SOLN
100.0000 mL | Freq: Once | INTRAMUSCULAR | Status: AC | PRN
  Administered 2012-01-21: 100 mL via INTRAVENOUS

## 2012-01-21 MED ORDER — ENOXAPARIN SODIUM 60 MG/0.6ML ~~LOC~~ SOLN
1.0000 mg/kg | Freq: Once | SUBCUTANEOUS | Status: AC
  Administered 2012-01-21: 60 mg via SUBCUTANEOUS
  Filled 2012-01-21: qty 0.6

## 2012-01-21 NOTE — ED Provider Notes (Signed)
History     CSN: [id number]  Arrival date & time 01/21/12  1641   First MD Initiated Contact with Patient 01/21/12 1701      Chief Complaint  Patient presents with  . Leg Pain    (Consider location/radiation/quality/duration/timing/severity/associated sxs/prior treatment) HPI Comments: Pain was getting out of van after a 2 hour car ride when her leg gave way and she injured her knee.  There is pain with bearing weight, ambulation.  The pain is in the back of the knee and calf area and radiates into the ankle when she walks.   Patient is a 71 y.o. female presenting with leg pain. The history is provided by the patient.  Leg Pain  The incident occurred less than 1 hour ago. The incident occurred at home. The injury mechanism was a fall (right knee). Pain location: right knee. The quality of the pain is described as sharp. The pain is moderate. The pain has been constant since onset. Pertinent negatives include no loss of sensation and no tingling.    Past Medical History  Diagnosis Date  . MVP (mitral valve prolapse)   . Brady-tachy syndrome   . HLD (hyperlipidemia)   . Palpitation   . Dyspepsia   . Vasovagal syncope   . Colitis, ischemic   . Fatty liver   . Osteopenia   . Diverticulosis of colon   . IBS (irritable bowel syndrome)   . Incontinence, feces   . Rectocele   . Hypertension   . Diverticulitis   . Rectocele   . nhl dx'd 07/1999    chemo comp 2001; rituxin comp 2005    Past Surgical History  Procedure Date  . Pelvic prolapse repair   . Abdominal hysterectomy   . Bladder suspension   . Bunionectomy   . Cholecystectomy 2003  . Upper gastrointestinal endoscopy 10/2005    hiatal hernia  . Colonoscopy 7/200/, 11/2007    diverticulosis  . Abdominal surgery     lymph node removal    Family History  Problem Relation Age of Onset  . Colon cancer Father   . Prostate cancer Brother   . Colon polyps Brother   . Colon polyps Brother     History  Substance  Use Topics  . Smoking status: Former Games developer  . Smokeless tobacco: Never Used   Comment: quit 1976  . Alcohol Use: No    OB History    Grav Para Term Preterm Abortions TAB SAB Ect Mult Living                  Review of Systems  Neurological: Negative for tingling.  All other systems reviewed and are negative.    Allergies  Codeine; Iodine; Iohexol; Pamine forte; Robinul; Sulfa antibiotics; and Sulfonamide derivatives  Home Medications   Current Outpatient Rx  Name Route Sig Dispense Refill  . ASPIRIN 81 MG PO TABS Oral Take 81 mg by mouth daily.      . ATENOLOL 25 MG PO TABS  TAKE 1 TABLET (25 MG TOTAL) BY MOUTH DAILY. 90 tablet 3  . CALCIUM CARBONATE 600 MG PO TABS Oral Take 600 mg by mouth 2 (two) times daily with a meal.      . STOOL SOFTENER PO Oral Take 1 capsule by mouth daily.      Marland Kitchen ESTRADIOL 0.1 MG/GM VA CREA Vaginal Place 2 g vaginally every other day.      . OMEGA-3 FATTY ACIDS 1000 MG PO CAPS Oral Take 2  g by mouth daily.      Marland Kitchen HYOSCYAMINE SULFATE ER 0.375 MG PO TB12  TAKE 1 TABLET IN THE MORNING AND YOU CAN TAKE A SECOND DOSE LATER IN THE DAY IF NEEDED 30 tablet 1  . HYOSCYAMINE SULFATE 0.125 MG SL SUBL Sublingual Place 0.125 mg under the tongue every 4 (four) hours as needed.      Marland Kitchen ONE-DAILY MULTI VITAMINS PO TABS Oral Take 1 tablet by mouth daily.      Marland Kitchen PANTOPRAZOLE SODIUM 40 MG PO TBEC  TAKE 1 TABLET BY MOUTH EVERY DAY 30 tablet 11  . VITAMIN D 400 UNITS PO TABS Oral Take 400 Units by mouth daily.      . BENEFIBER PO POWD Oral Take by mouth.        BP 149/65  Pulse 68  Temp 98.6 F (37 C) (Oral)  Resp 18  Ht 5' 3.5" (1.613 m)  Wt 134 lb (60.782 kg)  BMI 23.36 kg/m2  SpO2 98%  Physical Exam  Nursing note and vitals reviewed. Constitutional: She is oriented to person, place, and time. She appears well-developed and well-nourished. No distress.  HENT:  Head: Normocephalic and atraumatic.  Neck: Normal range of motion. Neck supple.    Musculoskeletal:       The right knee is noted to have a small effusion present.  There is pain with range of motion.  It is stable ap/laterally.  Negative anterior/posterior drawer test.    Neurological: She is alert and oriented to person, place, and time.  Skin: Skin is warm and dry. She is not diaphoretic.    ED Course  Procedures (including critical care time)  Labs Reviewed - No data to display Ct Soft Tissue Neck W Contrast  01/21/2012  *RADIOLOGY REPORT*  Clinical Data:  Non-Hodgkins lymphoma.  Chemotherapy completed.  CT NECK, CHEST, ABDOMEN AND PELVIS WITH CONTRAST  Technique:  Multidetector CT imaging of the neck, chest, abdomen and pelvis was performed using the standard protocol following the bolus administration of intravenous contrast.  Contrast: OMNIPAQUE IOHEXOL 300 MG/ML  SOLN  Comparison:  08/27/2010  CT NECK  Findings:  Parapharyngeal spaces appear symmetric and unremarkable. Small bilateral station II lymph nodes are within normal limits and essentially stable.  No pathologic adenopathy in the neck observed. No significant salivary gland abnormality observed.  Mild lymphoid prominence of the base of the tongue is similar to prior.  No supraclavicular adenopathy noted.  Thyroid gland unremarkable.  Carotid and vertebral arteries widely patent.  IMPRESSION:  1.  No findings of pathologic adenopathy or recurrent lymphoma in the neck.  Slight chronic lymphoid prominence of the base of tongue is thought to be incidental.  CT CHEST  Findings: No pathologic thoracic adenopathy.  No discrete vascular abnormality.  There is evidence old granulomatous disease.  Minimal biapical pleuroparenchymal scarring noted.  Lungs appear otherwise clear.  Stable posterior osseous ridging/calcified disc bulges at the T11- 12 and T12-L1 levels.  IMPRESSION:  1.  Old granulomatous disease. 2.  No findings of thoracic malignancy.  CT ABDOMEN AND PELVIS  Findings: Liver appears unremarkable.  Several  tiny hypodensities in the spleen are stable and believed incidental. No pathologic retroperitoneal or porta hepatis adenopathy is identified.  Gallbladder surgically absent.  Bilateral extrarenal pelvis noted.  Small left external iliac node is stable and not pathologically enlarged by size criteria.  Sigmoid diverticulosis noted without active diverticulitis. Appendix unremarkable.  Urinary bladder unremarkable.  Gastric antral wall thickening on portal venous  phase images is improved on delayed phase images, and likely represented peristalsis.  Uterus absent.  Ovaries not visualized.  Chronic grade 1 degenerative anterolisthesis of L3-4 noted.  IMPRESSION:  1.  No findings of malignancy in the abdomen/pelvis. 2.  Sigmoid diverticulosis. 3.  Stable lumbar spondylosis.   Original Report Authenticated By: Dellia Cloud, M.D.    Ct Chest W Contrast  01/21/2012  *RADIOLOGY REPORT*  Clinical Data:  Non-Hodgkins lymphoma.  Chemotherapy completed.  CT NECK, CHEST, ABDOMEN AND PELVIS WITH CONTRAST  Technique:  Multidetector CT imaging of the neck, chest, abdomen and pelvis was performed using the standard protocol following the bolus administration of intravenous contrast.  Contrast: OMNIPAQUE IOHEXOL 300 MG/ML  SOLN  Comparison:  08/27/2010  CT NECK  Findings:  Parapharyngeal spaces appear symmetric and unremarkable. Small bilateral station II lymph nodes are within normal limits and essentially stable.  No pathologic adenopathy in the neck observed. No significant salivary gland abnormality observed.  Mild lymphoid prominence of the base of the tongue is similar to prior.  No supraclavicular adenopathy noted.  Thyroid gland unremarkable.  Carotid and vertebral arteries widely patent.  IMPRESSION:  1.  No findings of pathologic adenopathy or recurrent lymphoma in the neck.  Slight chronic lymphoid prominence of the base of tongue is thought to be incidental.  CT CHEST  Findings: No pathologic thoracic  adenopathy.  No discrete vascular abnormality.  There is evidence old granulomatous disease.  Minimal biapical pleuroparenchymal scarring noted.  Lungs appear otherwise clear.  Stable posterior osseous ridging/calcified disc bulges at the T11- 12 and T12-L1 levels.  IMPRESSION:  1.  Old granulomatous disease. 2.  No findings of thoracic malignancy.  CT ABDOMEN AND PELVIS  Findings: Liver appears unremarkable.  Several tiny hypodensities in the spleen are stable and believed incidental. No pathologic retroperitoneal or porta hepatis adenopathy is identified.  Gallbladder surgically absent.  Bilateral extrarenal pelvis noted.  Small left external iliac node is stable and not pathologically enlarged by size criteria.  Sigmoid diverticulosis noted without active diverticulitis. Appendix unremarkable.  Urinary bladder unremarkable.  Gastric antral wall thickening on portal venous phase images is improved on delayed phase images, and likely represented peristalsis.  Uterus absent.  Ovaries not visualized.  Chronic grade 1 degenerative anterolisthesis of L3-4 noted.  IMPRESSION:  1.  No findings of malignancy in the abdomen/pelvis. 2.  Sigmoid diverticulosis. 3.  Stable lumbar spondylosis.   Original Report Authenticated By: Dellia Cloud, M.D.    Ct Abdomen Pelvis W Contrast  01/21/2012  *RADIOLOGY REPORT*  Clinical Data:  Non-Hodgkins lymphoma.  Chemotherapy completed.  CT NECK, CHEST, ABDOMEN AND PELVIS WITH CONTRAST  Technique:  Multidetector CT imaging of the neck, chest, abdomen and pelvis was performed using the standard protocol following the bolus administration of intravenous contrast.  Contrast: OMNIPAQUE IOHEXOL 300 MG/ML  SOLN  Comparison:  08/27/2010  CT NECK  Findings:  Parapharyngeal spaces appear symmetric and unremarkable. Small bilateral station II lymph nodes are within normal limits and essentially stable.  No pathologic adenopathy in the neck observed. No significant salivary gland  abnormality observed.  Mild lymphoid prominence of the base of the tongue is similar to prior.  No supraclavicular adenopathy noted.  Thyroid gland unremarkable.  Carotid and vertebral arteries widely patent.  IMPRESSION:  1.  No findings of pathologic adenopathy or recurrent lymphoma in the neck.  Slight chronic lymphoid prominence of the base of tongue is thought to be incidental.  CT CHEST  Findings: No pathologic thoracic adenopathy.  No discrete vascular abnormality.  There is evidence old granulomatous disease.  Minimal biapical pleuroparenchymal scarring noted.  Lungs appear otherwise clear.  Stable posterior osseous ridging/calcified disc bulges at the T11- 12 and T12-L1 levels.  IMPRESSION:  1.  Old granulomatous disease. 2.  No findings of thoracic malignancy.  CT ABDOMEN AND PELVIS  Findings: Liver appears unremarkable.  Several tiny hypodensities in the spleen are stable and believed incidental. No pathologic retroperitoneal or porta hepatis adenopathy is identified.  Gallbladder surgically absent.  Bilateral extrarenal pelvis noted.  Small left external iliac node is stable and not pathologically enlarged by size criteria.  Sigmoid diverticulosis noted without active diverticulitis. Appendix unremarkable.  Urinary bladder unremarkable.  Gastric antral wall thickening on portal venous phase images is improved on delayed phase images, and likely represented peristalsis.  Uterus absent.  Ovaries not visualized.  Chronic grade 1 degenerative anterolisthesis of L3-4 noted.  IMPRESSION:  1.  No findings of malignancy in the abdomen/pelvis. 2.  Sigmoid diverticulosis. 3.  Stable lumbar spondylosis.   Original Report Authenticated By: Dellia Cloud, M.D.      No diagnosis found.    MDM  The patient presents here with knee and calf pain that started after a two hour car ride.  Her leg gave out while getting out of the [place].  I suspect this is a sprain, however this did occur after a car trip and  she is having pain in the calf as well.  The xrays do not show a fracture and it appears as though there is a small effusion.  Ultrasound has left for the day so I will make arrangements for return in the AM for the dvt study.  She will be given lovenox this evening until the ultrasound has ruled this out.           Geoffery Lyons, MD 01/21/12 [phone]

## 2012-01-21 NOTE — ED Notes (Signed)
Stepped out of van and fell down to her knees, Pain rt popliteal area and down to ankle,

## 2012-01-22 ENCOUNTER — Ambulatory Visit (HOSPITAL_COMMUNITY)
Admit: 2012-01-22 | Discharge: 2012-01-22 | Disposition: A | Discharge status: Home / Self Care | Payer: Medicare Other | Source: Ambulatory Visit | Attending: Emergency Medicine | Admitting: Emergency Medicine

## 2012-01-22 DIAGNOSIS — M79609 Pain in unspecified limb: Secondary | ICD-10-CM | POA: Insufficient documentation

## 2012-01-25 ENCOUNTER — Telehealth: Payer: Self-pay | Admitting: *Deleted

## 2012-01-25 NOTE — Telephone Encounter (Signed)
per md schedule being over booked moved patient to 02-21-2012 left voice message to inform the patient

## 2012-02-01 ENCOUNTER — Ambulatory Visit: Payer: Medicare Other | Admitting: Oncology

## 2012-02-01 ENCOUNTER — Other Ambulatory Visit: Payer: Medicare Other | Admitting: Lab

## 2012-02-07 DIAGNOSIS — H04129 Dry eye syndrome of unspecified lacrimal gland: Secondary | ICD-10-CM | POA: Diagnosis not present

## 2012-02-10 DIAGNOSIS — L03019 Cellulitis of unspecified finger: Secondary | ICD-10-CM | POA: Diagnosis not present

## 2012-02-14 ENCOUNTER — Telehealth: Payer: Self-pay | Admitting: *Deleted

## 2012-02-14 NOTE — Telephone Encounter (Signed)
Mailed out calendar to inform the patient of the new date and time 

## 2012-02-21 ENCOUNTER — Ambulatory Visit: Payer: Medicare Other | Admitting: Oncology

## 2012-02-24 DIAGNOSIS — L03019 Cellulitis of unspecified finger: Secondary | ICD-10-CM | POA: Diagnosis not present

## 2012-02-25 DIAGNOSIS — H2510 Age-related nuclear cataract, unspecified eye: Secondary | ICD-10-CM | POA: Diagnosis not present

## 2012-02-25 DIAGNOSIS — H43819 Vitreous degeneration, unspecified eye: Secondary | ICD-10-CM | POA: Diagnosis not present

## 2012-02-25 DIAGNOSIS — H43829 Vitreomacular adhesion, unspecified eye: Secondary | ICD-10-CM | POA: Diagnosis not present

## 2012-03-09 DIAGNOSIS — Z23 Encounter for immunization: Secondary | ICD-10-CM | POA: Diagnosis not present

## 2012-03-13 ENCOUNTER — Other Ambulatory Visit: Payer: Self-pay | Admitting: *Deleted

## 2012-03-13 DIAGNOSIS — Z87898 Personal history of other specified conditions: Secondary | ICD-10-CM

## 2012-03-15 ENCOUNTER — Ambulatory Visit (HOSPITAL_BASED_OUTPATIENT_CLINIC_OR_DEPARTMENT_OTHER): Payer: Medicare Other | Admitting: Oncology

## 2012-03-15 ENCOUNTER — Telehealth: Payer: Self-pay | Admitting: Oncology

## 2012-03-15 VITALS — BP 143/78 | HR 76 | Temp 97.8°F | Resp 20 | Ht 63.5 in | Wt 140.8 lb

## 2012-03-15 DIAGNOSIS — C8589 Other specified types of non-Hodgkin lymphoma, extranodal and solid organ sites: Secondary | ICD-10-CM

## 2012-03-15 DIAGNOSIS — K589 Irritable bowel syndrome without diarrhea: Secondary | ICD-10-CM | POA: Diagnosis not present

## 2012-03-15 DIAGNOSIS — E559 Vitamin D deficiency, unspecified: Secondary | ICD-10-CM

## 2012-03-15 DIAGNOSIS — C8590 Non-Hodgkin lymphoma, unspecified, unspecified site: Secondary | ICD-10-CM

## 2012-03-15 NOTE — Telephone Encounter (Signed)
gve the pt her nov 2014 appt calendar. Pt is aware that she will be contacted with the ct scan appts

## 2012-04-13 DIAGNOSIS — Z79899 Other long term (current) drug therapy: Secondary | ICD-10-CM | POA: Diagnosis not present

## 2012-04-13 DIAGNOSIS — L821 Other seborrheic keratosis: Secondary | ICD-10-CM | POA: Diagnosis not present

## 2012-04-13 DIAGNOSIS — L03019 Cellulitis of unspecified finger: Secondary | ICD-10-CM | POA: Diagnosis not present

## 2012-04-17 NOTE — Progress Notes (Signed)
Hematology and Oncology Follow Up Visit  [name] [name] [id number] February 03, 1941 71 y.o. 03/15/12  11:51 AM   DIAGNOSIS:   Encounter Diagnoses  Name Primary?  . NHL (non-Hodgkin's lymphoma) Yes  . Unspecified vitamin D deficiency      PAST THERAPY:  1. Low-grade lymphoma, initially diagnosed March 2001, status post initial therapy with mitoxantrone, fludarabine, Rituxan, completed January 2002 with progressive disease noted in February 2004, status post single-agent Rituxan, given February 2005 and August 2005, now on observation.   2. History of irritable bowel syndrome  Interim History:   Clois returns for f/u,  she has been feeling well. She has no complaints apart from ongoing anxiety regarding her disease. Her GI f/u has been quite extensive. She continues to have migratory abdominal cramps , but denies B symptoms or weight changes. Her life is otherwise is quite satisfactory. There have not been any intercurrent medical issues or hospitilizations.  Medications: I have reviewed the patient's current medications.  Allergies:  Allergies  Allergen Reactions  . Codeine   . Iodine   . Iohexol      Desc: 50 mg benadryl prior to exam   . Pamine Forte (Methscopolamine)   . Robinul (Glycopyrrolate)   . Sulfa Antibiotics   . Sulfonamide Derivatives     Past Medical History, Surgical history, Social history, and Family History were reviewed and updated.  Review of Systems: Constitutional:  Negative for fever, chills, night sweats, anorexia, weight loss, pain. Cardiovascular: no chest pain or dyspnea on exertion Respiratory: no cough, shortness of breath, or wheezing Neurological: no TIA or stroke symptoms Dermatological: negative ENT: negative Skin Gastrointestinal: no abdominal pain, change in bowel habits, or black or bloody stools Genito-Urinary: no dysuria, trouble voiding, or hematuria Hematological and Lymphatic: negative Breast: negative Musculoskeletal:  negative Remaining ROS negative.  Physical Exam:  Blood pressure 143/78, pulse 76, temperature 97.8 F (36.6 C), resp. rate 20, height 5' 3.5" (1.613 m), weight 140 lb 12.8 oz (63.866 kg).  ECOG: 0   HEENT:  Sclerae anicteric, conjunctivae pink.  Oropharynx clear.  No mucositis or candidiasis.  Nodes:  No cervical, supraclavicular, or axillary lymphadenopathy palpated.  Breast Exam:  Right breast is benign.  No masses, discharge, skin change, or nipple inversion.  Left breast is benign.  No masses, discharge, skin change, or nipple inversion..  Lungs:  Clear to auscultation bilaterally.  No crackles, rhonchi, or wheezes.  Heart:  Regular rate and rhythm.  Abdomen:  Soft, nontender.  Positive bowel sounds.  No organomegaly or masses palpated.  Musculoskeletal:  No focal spinal tenderness to palpation.  Extremities:  Benign.  No peripheral edema or cyanosis.  Skin:  Benign.  Neuro:  Nonfocal.    Lab Results: Lab Results  Component Value Date   WBC 6.0 01/20/2012   HGB 14.0 01/20/2012   HCT 40.8 01/20/2012   MCV 91.1 01/20/2012   PLT 203 01/20/2012     Chemistry      Component Value Date/Time   NA 142 01/20/2012 1021   NA 141 03/03/2011 1058   K 4.7 01/20/2012 1021   K 4.5 03/03/2011 1058   CL 105 01/20/2012 1021   CL 104 03/03/2011 1058   CO2 27 01/20/2012 1021   CO2 28 03/03/2011 1058   BUN 16.0 01/20/2012 1021   BUN 16 03/03/2011 1058   CREATININE 0.8 01/20/2012 1021   CREATININE 0.82 03/03/2011 1058      Component Value Date/Time   CALCIUM 10.6* 01/20/2012 1021  CALCIUM 10.6* 03/03/2011 1058   ALKPHOS 77 01/20/2012 1021   ALKPHOS 71 03/03/2011 1058   AST 22 01/20/2012 1021   AST 24 03/03/2011 1058   ALT 23 01/20/2012 1021   ALT 22 03/03/2011 1058   BILITOT 0.40 01/20/2012 1021   BILITOT 0.3 03/03/2011 1058       Radiological Studies:  CT C/A/P - 9/13, wnl   IMPRESSIONS AND PLAN: A 72 y.o. female with   History of low grade NHL , s/p initial therapy with  mitoxantrone/fludara/rituxan in 2002 and single agent rituxan in 2004. Currently disease free. I will see her in 1 yr with appropriate imaging. Spent more than half the time coordinating care, as well as discussion of BMI and its implications.      Lyndall Bellot 12/16/201311:51 AM Cell [id number]

## 2012-05-11 DIAGNOSIS — H52229 Regular astigmatism, unspecified eye: Secondary | ICD-10-CM | POA: Diagnosis not present

## 2012-05-11 DIAGNOSIS — H524 Presbyopia: Secondary | ICD-10-CM | POA: Diagnosis not present

## 2012-05-11 DIAGNOSIS — H5210 Myopia, unspecified eye: Secondary | ICD-10-CM | POA: Diagnosis not present

## 2012-05-11 DIAGNOSIS — H43399 Other vitreous opacities, unspecified eye: Secondary | ICD-10-CM | POA: Diagnosis not present

## 2012-05-18 DIAGNOSIS — Z01419 Encounter for gynecological examination (general) (routine) without abnormal findings: Secondary | ICD-10-CM | POA: Diagnosis not present

## 2012-05-18 DIAGNOSIS — Z124 Encounter for screening for malignant neoplasm of cervix: Secondary | ICD-10-CM | POA: Diagnosis not present

## 2012-06-24 ENCOUNTER — Encounter: Payer: Self-pay | Admitting: Oncology

## 2012-06-24 ENCOUNTER — Telehealth: Payer: Self-pay | Admitting: Oncology

## 2012-06-24 NOTE — Telephone Encounter (Signed)
lvm for pt regarding Nov appt change....advised pt letter and appt schedule sent....for pt of Dr. Donnie Coffin

## 2012-06-30 ENCOUNTER — Telehealth: Payer: Self-pay | Admitting: Oncology

## 2012-06-30 NOTE — Telephone Encounter (Signed)
returned pt called and confirmed new Dr. reassignment....pt ok

## 2012-07-24 ENCOUNTER — Encounter: Payer: Self-pay | Admitting: Internal Medicine

## 2012-07-24 ENCOUNTER — Ambulatory Visit (INDEPENDENT_AMBULATORY_CARE_PROVIDER_SITE_OTHER): Payer: Medicare Other | Admitting: Internal Medicine

## 2012-07-24 VITALS — BP 100/70 | HR 75 | Ht 63.5 in | Wt 140.6 lb

## 2012-07-24 DIAGNOSIS — R079 Chest pain, unspecified: Secondary | ICD-10-CM

## 2012-07-24 DIAGNOSIS — K219 Gastro-esophageal reflux disease without esophagitis: Secondary | ICD-10-CM

## 2012-07-24 MED ORDER — NITROGLYCERIN 0.4 MG SL SUBL: 0.4000 mg | SUBLINGUAL_TABLET | SUBLINGUAL | Status: DC | PRN

## 2012-07-24 MED ORDER — PHILLIPS COLON HEALTH PO CAPS: 1.0000 | ORAL_CAPSULE | Freq: Every day | ORAL | Status: DC

## 2012-07-24 NOTE — Progress Notes (Signed)
  Subjective:    Patient ID: [name] [name], female    DOB: [birth year], 72 y.o.   MRN: [id number]  HPI The patient presents in followup. She was seen last year with diverticulitis which has not been a problem. She is discovered [employer]: Health probiotic and thinks that helps quite a bit with her bloating and gas problems. Also with loose stools. Lind Guest she continues to have a burning chest pain when she exerts herself with walking. That is her main exercise. She may use a treadmill but she also walks outside for exercise and tells me that within the past week she took her grandson to see the [employer] and walking [place] she had burning chest pain that resolved upon rest. When I saw her last year, because of some question of whether or not this might be reflux I suggested she try Tums when walking and she is continued to do that but really has not provided much if any benefit. In 2012 she had an unrevealing stress test - EKG changes but negative nuclear images - "low risk" due to baseline EKG changes. She saw Dr. Daleen Squibb in 2013 and he did not think her exertional pain was angina.  Medications, allergies, past medical history, past surgical history, family history and social history are reviewed and updated in the EMR.  Review of Systems As above    Objective:   Physical Exam General:  NAD Eyes:   anicteric Lungs:  clear Heart:  S1S2 no rubs, murmurs or gallops there is a click Abdomen:  soft and nontender, BS+  Data Reviewed:  As per history of present illness     Assessment & Plan:   1. Chest pain - exertional, sounds like angina to me   2. GERD (gastroesophageal reflux disease)    1. Trial of nitroglycerin 0.4 mg sublingual for the chest pain 2. Keep followup with Dr. Daleen Squibb as planned for May the 14 though she may need something sooner. It sounds like this is fairly stable to somewhat worse as far as exertional chest pain occurring with less walking at this point but it is  always relieved with rest. 3. I don't think further endoscopy or reflux testing is indicated. She is not completely happy with pantoprazole and was more happy with Nexium though that is too costly, but before any further attempts to change her PPI therapy are made to see if the nitroglycerin makes a difference and whether cardiac workup might be needed. ? she could have microvascular angina.  WU:[id number] STEPHEN [place], MD

## 2012-07-24 NOTE — Patient Instructions (Addendum)
We have sent the following medications to your pharmacy for you to pick up at your convenience: Nitroglycerin  Stop using the Tums when you exercise and try the rx sent in today.  Thank you for choosing me and [facility].  Iva Boop, M.D., [facility]    Cancelled rx at [facility] and re-sent to [facility], patient has changed to there.

## 2012-08-16 DIAGNOSIS — M25559 Pain in unspecified hip: Secondary | ICD-10-CM | POA: Diagnosis not present

## 2012-09-22 DIAGNOSIS — M2040 Other hammer toe(s) (acquired), unspecified foot: Secondary | ICD-10-CM | POA: Diagnosis not present

## 2012-09-27 ENCOUNTER — Encounter: Payer: Self-pay | Admitting: Cardiology

## 2012-09-27 ENCOUNTER — Ambulatory Visit (INDEPENDENT_AMBULATORY_CARE_PROVIDER_SITE_OTHER): Payer: Medicare Other | Admitting: Cardiology

## 2012-09-27 ENCOUNTER — Encounter: Payer: Self-pay | Admitting: Internal Medicine

## 2012-09-27 ENCOUNTER — Telehealth: Payer: Self-pay | Admitting: Internal Medicine

## 2012-09-27 VITALS — BP 118/72 | HR 62 | Ht 63.5 in | Wt 138.0 lb

## 2012-09-27 DIAGNOSIS — I059 Rheumatic mitral valve disease, unspecified: Secondary | ICD-10-CM | POA: Diagnosis not present

## 2012-09-27 DIAGNOSIS — R079 Chest pain, unspecified: Secondary | ICD-10-CM

## 2012-09-27 DIAGNOSIS — E785 Hyperlipidemia, unspecified: Secondary | ICD-10-CM

## 2012-09-27 DIAGNOSIS — K3189 Other diseases of stomach and duodenum: Secondary | ICD-10-CM | POA: Diagnosis not present

## 2012-09-27 DIAGNOSIS — I2510 Atherosclerotic heart disease of native coronary artery without angina pectoris: Secondary | ICD-10-CM | POA: Diagnosis not present

## 2012-09-27 DIAGNOSIS — R1013 Epigastric pain: Secondary | ICD-10-CM | POA: Diagnosis not present

## 2012-09-27 NOTE — Telephone Encounter (Signed)
Patient wants to set up Endo with her colon for August.  Patient has not tried Nitroglycerine that was given to her at the office visit in March.  Patient advised that she needs to try the nitro as recommended at the office visit if she is still having pain she needs to call back for an appt

## 2012-09-27 NOTE — Assessment & Plan Note (Signed)
Stable by exam and history. Some of the chest burning could be from prolapse. Reassurance given.

## 2012-09-27 NOTE — Assessment & Plan Note (Signed)
I do not think this is angina. I reviewed the findings are for stress Myoview which showed fairly good exercise tolerance with a normal ejection fraction no ischemia 2012. I've advised [name] [name] not to take sublingual nitroglycerin prior walking. I reviewed the symptoms of angina and instructions to use supplemental nitroglycerin only if it doesn't go away with rest.

## 2012-09-27 NOTE — Progress Notes (Signed)
HPI [name] [name] returns today for evaluation and management of some chest burning with exercise or walking. She was given sublingual nitroglycerin to take prior walking by Dr. Leone Payor. She denies any chest pressure heaviness or typical angina. The burning does go away with stopping. She does have a history of prolapse and atypical chest pain. We performed a stress nuclear study in June 2012 which was negative for ischemia with normal ejection fraction.  She has not taken a nitroglycerin for fear it would give her a headache.  She denies palpitations presyncope or syncope.  Other than her age, she has no cardiac risk factors for coronary disease. She does have a history of reflux.  Past Medical History  Diagnosis Date  . MVP (mitral valve prolapse)   . Brady-tachy syndrome   . HLD (hyperlipidemia)   . Palpitation   . Dyspepsia   . Vasovagal syncope   . Colitis, ischemic   . Fatty liver   . Osteopenia   . Diverticulosis of colon   . IBS (irritable bowel syndrome)   . Incontinence, feces   . Rectocele   . Hypertension   . Diverticulitis   . Rectocele   . nhl dx'd 07/1999    chemo comp 2001; rituxin comp 2005  . Colon polyp 07/29/1993    with focal adenomatous changes    Current Outpatient Prescriptions  Medication Sig Dispense Refill  . aspirin EC 81 MG tablet Take 81 mg by mouth daily.      Marland Kitchen atenolol (TENORMIN) 50 MG tablet Take 50 mg by mouth daily.      . calcium carbonate (OS-CAL) 600 MG TABS Take 600 mg by mouth 2 (two) times daily with a meal.        . fish oil-omega-3 fatty acids 1000 MG capsule Take 2 g by mouth daily.        . Multiple Vitamin (MULTIVITAMIN) tablet Take 1 tablet by mouth daily.        . nitroGLYCERIN (NITROSTAT) 0.4 MG SL tablet Place 1 tablet (0.4 mg total) under the tongue every 5 (five) minutes as needed for chest pain (may take every 5 minutes x 3 if needed).  25 tablet  1  . pantoprazole (PROTONIX) 40 MG tablet TAKE 1 TABLET BY MOUTH EVERY DAY  30  tablet  11  . Probiotic Product (PHILLIPS COLON HEALTH) CAPS Take 1 capsule by mouth daily.       No current facility-administered medications for this visit.    Allergies  Allergen Reactions  . Codeine   . Iodine   . Iohexol      Desc: 50 mg benadryl prior to exam   . Pamine Forte (Methscopolamine)   . Robinul (Glycopyrrolate)   . Sulfa Antibiotics   . Sulfonamide Derivatives     Family History  Problem Relation Age of Onset  . Colon cancer Father   . Prostate cancer Brother   . Colon polyps Brother   . Colon polyps Brother     History   Social History  . Marital Status: Married    Spouse Name: N/A    Number of Children: 1  . Years of Education: N/A   Occupational History  . Retired   .     Social History Main Topics  . Smoking status: Former Games developer  . Smokeless tobacco: Never Used     Comment: quit 1976  . Alcohol Use: No  . Drug Use: No  . Sexually Active: Yes    Birth  Control/ Protection: Surgical   Other Topics Concern  . Not on file   Social History Narrative  . No narrative on file    ROS ALL NEGATIVE EXCEPT THOSE NOTED IN HPI  PE  General Appearance: well developed, well nourished in no acute distress HEENT: symmetrical face, PERRLA, good dentition  Neck: no JVD, thyromegaly, or adenopathy, trachea midline Chest: symmetric without deformity Cardiac: PMI non-displaced, RRR, normal S1, S2, no gallop, soft systolic murmur at the apex with no obvious click Lung: clear to ausculation and percussion Vascular: all pulses full without bruits  Abdominal: nondistended, nontender, good bowel sounds, no HSM, no bruits Extremities: no cyanosis, clubbing or edema, no sign of DVT, no varicosities  Skin: normal color, no rashes Neuro: alert and oriented x 3, non-focal Pysch: normal affect  EKG Normal sinus rhythm, normal EKG. BMET    Component Value Date/Time   NA 142 01/20/2012 1021   NA 141 03/03/2011 1058   K 4.7 01/20/2012 1021   K 4.5  03/03/2011 1058   CL 105 01/20/2012 1021   CL 104 03/03/2011 1058   CO2 27 01/20/2012 1021   CO2 28 03/03/2011 1058   GLUCOSE 91 01/20/2012 1021   GLUCOSE 76 03/03/2011 1058   BUN 16.0 01/20/2012 1021   BUN 16 03/03/2011 1058   CREATININE 0.8 01/20/2012 1021   CREATININE 0.82 03/03/2011 1058   CALCIUM 10.6* 01/20/2012 1021   CALCIUM 10.6* 03/03/2011 1058   GFRNONAA 88.54 08/29/2008 0000   GFRAA  Value: >60        The eGFR has been calculated using the MDRD equation. This calculation has not been validated in all clinical 01/13/2007 0657    Lipid Panel  No results found for this basename: chol, trig, hdl, cholhdl, vldl, ldlcalc    CBC    Component Value Date/Time   WBC 6.0 01/20/2012 1021   WBC 10.9* 12/18/2010 1138   RBC 4.48 01/20/2012 1021   RBC 4.16 12/18/2010 1138   HGB 14.0 01/20/2012 1021   HGB 12.9 12/18/2010 1138   HCT 40.8 01/20/2012 1021   HCT 38.5 12/18/2010 1138   PLT 203 01/20/2012 1021   PLT 251.0 12/18/2010 1138   MCV 91.1 01/20/2012 1021   MCV 92.5 12/18/2010 1138   MCH 31.2 01/20/2012 1021   MCHC 34.2 01/20/2012 1021   MCHC 33.6 12/18/2010 1138   RDW 13.3 01/20/2012 1021   RDW 13.3 12/18/2010 1138   LYMPHSABS 1.8 01/20/2012 1021   LYMPHSABS 1.5 12/18/2010 1138   MONOABS 0.8 01/20/2012 1021   MONOABS 1.1* 12/18/2010 1138   EOSABS 0.2 01/20/2012 1021   EOSABS 0.0 12/18/2010 1138   BASOSABS 0.0 01/20/2012 1021   BASOSABS 0.0 12/18/2010 1138

## 2012-09-27 NOTE — Patient Instructions (Addendum)
Your physician recommends that you continue on your current medications as directed. Please refer to the Current Medication list given to you today.  Your physician wants you and your husband to follow-up in: 1 year with Dr. Delton See in our [place] office. You will receive a reminder letter in the mail two months in advance. If you don't receive a letter, please call our office to schedule the follow-up appointment.

## 2012-09-29 ENCOUNTER — Telehealth: Payer: Self-pay | Admitting: *Deleted

## 2012-09-29 ENCOUNTER — Telehealth: Payer: Self-pay | Admitting: Internal Medicine

## 2012-09-29 NOTE — Telephone Encounter (Signed)
Former pt of [place] reassigned to [facility] and then reassigned to [facility] due to [facility] departure. Pt called in to scheduling requesting appt w/HH due to spots on her arm and message was forwarded to me. I contacted pt re being reassigned to [facility] due to [facility] departure. Pt ok w/seeing [facility] and was forwarded to triage re new issue. Pt aware if sooner appt is needed triage will take care of getting her in to see [facility] sooner.

## 2012-09-29 NOTE — Telephone Encounter (Signed)
TWO MONTHS AGO PT. NOTICED THREE AREAS ON HER RIGHT ARM. THE AREAS WERE PEA SIZE BUT ONE HAS INCREASED TO DIME SIZE. ALL THREE AREAS ARE "SEMI HARD AND MUSHY". NO PAIN, REDNESS, OR DRAINAGE. WOULD LIKE AN EARLIER APPOINTMENT BUT AFTER 3:00PM. THIS NOTE TO DR.MOHAMED'S NURSE, DIANE BELL,RN.

## 2012-10-02 ENCOUNTER — Encounter: Payer: Self-pay | Admitting: Cardiology

## 2012-10-04 ENCOUNTER — Telehealth: Payer: Self-pay | Admitting: *Deleted

## 2012-10-04 NOTE — Telephone Encounter (Signed)
Spoke with pt and she verbalized understanding regarding r/s a f/u appt to sooner.  Onc tx schedule filled out.  SLJ

## 2012-10-04 NOTE — Telephone Encounter (Signed)
Per Dr Donnald Garre, okay to schedule f/u appt sooner but can also f/u with PCP if they can get her in sooner.  Called patient and left vm on home # to call back so we can get appts scheduled if pt desires.  SLJ

## 2012-10-05 ENCOUNTER — Telehealth: Payer: Self-pay | Admitting: Internal Medicine

## 2012-10-05 ENCOUNTER — Telehealth: Payer: Self-pay | Admitting: Medical Oncology

## 2012-10-05 NOTE — Telephone Encounter (Signed)
sw.. pt and tried to r/s appt...the pt did not want to r/s appt bc ct did not cover the problems that she was having with her arm but she only wanted to have an appt with Dr. Shirline Frees...did not r/s appts...s.w. Diane Systems analyst and told her to call pt

## 2012-10-05 NOTE — Telephone Encounter (Signed)
Pt concerned about 3 lumps that have appeared on her r arm , two above her elbow on inside and 1 below elbow on inside. They are dime sized and painless. She " had one like this before and Dr Donnie Coffin thought it was a lipoma". She requested to see Dr Arbutus Ped before Nov.

## 2012-10-06 ENCOUNTER — Telehealth: Payer: Self-pay | Admitting: Internal Medicine

## 2012-10-06 NOTE — Telephone Encounter (Signed)
s.w. pt husband and advised on lab being move b4 ct in [facility]

## 2012-10-06 NOTE — Telephone Encounter (Signed)
Pt called to ask why Dr Arbutus Ped wants her to have CT scans if the problem is on her arms. I called pt back and told her that this is how Dr Arbutus Ped wants to proceed and explained someone will call her with lab and ct appt -she prefers lab day before and ct scan on Friday .

## 2012-10-09 ENCOUNTER — Other Ambulatory Visit: Payer: Self-pay | Admitting: Cardiology

## 2012-10-10 ENCOUNTER — Other Ambulatory Visit: Payer: Self-pay

## 2012-10-10 MED ORDER — OMEPRAZOLE 40 MG PO CPDR: 40.0000 mg | DELAYED_RELEASE_CAPSULE | Freq: Every day | ORAL | Status: DC

## 2012-10-10 NOTE — Telephone Encounter (Signed)
Sent in omeprazole rx to replace pantoprazole which is on backorder.

## 2012-10-19 DIAGNOSIS — D2370 Other benign neoplasm of skin of unspecified lower limb, including hip: Secondary | ICD-10-CM | POA: Diagnosis not present

## 2012-10-19 DIAGNOSIS — L821 Other seborrheic keratosis: Secondary | ICD-10-CM | POA: Diagnosis not present

## 2012-10-19 DIAGNOSIS — L570 Actinic keratosis: Secondary | ICD-10-CM | POA: Diagnosis not present

## 2012-10-19 DIAGNOSIS — D239 Other benign neoplasm of skin, unspecified: Secondary | ICD-10-CM | POA: Diagnosis not present

## 2012-10-23 ENCOUNTER — Other Ambulatory Visit: Payer: Medicare Other | Admitting: Lab

## 2012-10-24 ENCOUNTER — Ambulatory Visit (INDEPENDENT_AMBULATORY_CARE_PROVIDER_SITE_OTHER): Payer: Medicare Other | Admitting: Internal Medicine

## 2012-10-24 ENCOUNTER — Encounter: Payer: Self-pay | Admitting: Internal Medicine

## 2012-10-24 VITALS — BP 126/82 | HR 68 | Ht 63.5 in | Wt 137.0 lb

## 2012-10-24 DIAGNOSIS — K219 Gastro-esophageal reflux disease without esophagitis: Secondary | ICD-10-CM | POA: Diagnosis not present

## 2012-10-24 DIAGNOSIS — R1310 Dysphagia, unspecified: Secondary | ICD-10-CM | POA: Diagnosis not present

## 2012-10-24 MED ORDER — OMEPRAZOLE-SODIUM BICARBONATE 20-1100 MG PO CAPS: 1.0000 | ORAL_CAPSULE | Freq: Every evening | ORAL | Status: DC

## 2012-10-24 NOTE — Progress Notes (Signed)
  Subjective:    Patient ID: [name] [name], female    DOB: February 17, 1941, 71 y.o.   MRN: [id number]  HPI Having painful burning swallowing, having to clear throat frequently Wet burps at night Trying OTC Nexium x 2 weeks, stopped 40 mg omeprazole Worried about Barrett's esophagus  Medications, allergies, past medical history, past surgical history, family history and social history are reviewed and updated in the EMR.  Review of Systems As above    Objective:   Physical Exam She is in no acute distress     Assessment & Plan:   1. Odynophagia   2. GERD (gastroesophageal reflux disease)    1. Elevate HOB - wedge or bed blocks  2. Add Nightly OTC Zegerid  3. EGD to evaluate these sxs, will do when she has her routine colonoscopy arranged 8/18  WU:[id number],[id number] STEPHEN

## 2012-10-24 NOTE — Patient Instructions (Addendum)
Take over the counter Zegerid in the evening to add to the Nexium in the morning.   We have added a Upper Endoscopy to your Colonoscopy already scheduled on 12/18/12. Please still come for you pre-visit to get your instructions with the nurse on 12/04/12.   I appreciate the opportunity to care for you.

## 2012-10-25 ENCOUNTER — Ambulatory Visit: Payer: Medicare Other | Admitting: Internal Medicine

## 2012-10-25 ENCOUNTER — Other Ambulatory Visit: Payer: Self-pay

## 2012-10-25 DIAGNOSIS — Z1231 Encounter for screening mammogram for malignant neoplasm of breast: Secondary | ICD-10-CM

## 2012-10-27 ENCOUNTER — Telehealth: Payer: Self-pay | Admitting: Internal Medicine

## 2012-10-27 NOTE — Telephone Encounter (Signed)
pt wants lab the day b4 ct.Marland KitchenMarland KitchenMarland KitchenDone

## 2012-11-02 DIAGNOSIS — M159 Polyosteoarthritis, unspecified: Secondary | ICD-10-CM | POA: Diagnosis not present

## 2012-11-02 DIAGNOSIS — M810 Age-related osteoporosis without current pathological fracture: Secondary | ICD-10-CM | POA: Diagnosis not present

## 2012-11-10 DIAGNOSIS — H2510 Age-related nuclear cataract, unspecified eye: Secondary | ICD-10-CM | POA: Diagnosis not present

## 2012-11-10 DIAGNOSIS — H43819 Vitreous degeneration, unspecified eye: Secondary | ICD-10-CM | POA: Diagnosis not present

## 2012-11-15 ENCOUNTER — Ambulatory Visit
Admission: RE | Admit: 2012-11-15 | Discharge: 2012-11-15 | Disposition: A | Discharge status: Home / Self Care | Payer: Medicare Other | Source: Ambulatory Visit

## 2012-11-15 DIAGNOSIS — Z1231 Encounter for screening mammogram for malignant neoplasm of breast: Secondary | ICD-10-CM | POA: Diagnosis not present

## 2012-12-04 ENCOUNTER — Ambulatory Visit (AMBULATORY_SURGERY_CENTER): Payer: Medicare Other | Admitting: *Deleted

## 2012-12-04 VITALS — Ht 63.5 in | Wt 142.0 lb

## 2012-12-04 DIAGNOSIS — Z1211 Encounter for screening for malignant neoplasm of colon: Secondary | ICD-10-CM

## 2012-12-04 DIAGNOSIS — K219 Gastro-esophageal reflux disease without esophagitis: Secondary | ICD-10-CM

## 2012-12-04 MED ORDER — NA SULFATE-K SULFATE-MG SULF 17.5-3.13-1.6 GM/177ML PO SOLN: ORAL | Status: DC

## 2012-12-04 NOTE — Progress Notes (Signed)
Patient denies any allergies to eggs or soy. Patient denies any problems with anesthesia.  

## 2012-12-18 ENCOUNTER — Encounter: Payer: Self-pay | Admitting: Internal Medicine

## 2012-12-18 ENCOUNTER — Encounter: Payer: Medicare Other | Admitting: Internal Medicine

## 2012-12-18 ENCOUNTER — Ambulatory Visit (AMBULATORY_SURGERY_CENTER): Payer: Medicare Other | Admitting: Internal Medicine

## 2012-12-18 VITALS — BP 134/77 | HR 66 | Temp 97.9°F | Resp 15 | Ht 63.0 in | Wt 142.0 lb

## 2012-12-18 DIAGNOSIS — I498 Other specified cardiac arrhythmias: Secondary | ICD-10-CM | POA: Diagnosis not present

## 2012-12-18 DIAGNOSIS — D131 Benign neoplasm of stomach: Secondary | ICD-10-CM

## 2012-12-18 DIAGNOSIS — Z800 Family history of malignant neoplasm of digestive organs: Secondary | ICD-10-CM

## 2012-12-18 DIAGNOSIS — K219 Gastro-esophageal reflux disease without esophagitis: Secondary | ICD-10-CM | POA: Diagnosis not present

## 2012-12-18 DIAGNOSIS — K317 Polyp of stomach and duodenum: Secondary | ICD-10-CM

## 2012-12-18 DIAGNOSIS — K5730 Diverticulosis of large intestine without perforation or abscess without bleeding: Secondary | ICD-10-CM

## 2012-12-18 DIAGNOSIS — Z1211 Encounter for screening for malignant neoplasm of colon: Secondary | ICD-10-CM | POA: Diagnosis not present

## 2012-12-18 DIAGNOSIS — K589 Irritable bowel syndrome without diarrhea: Secondary | ICD-10-CM | POA: Diagnosis not present

## 2012-12-18 MED ORDER — SODIUM CHLORIDE 0.9 % IV SOLN: 500.0000 mL | INTRAVENOUS | Status: DC

## 2012-12-18 NOTE — Progress Notes (Signed)
Called to room to assist during endoscopic procedure.  Patient ID and intended procedure confirmed with present staff. Received instructions for my participation in the procedure from the performing physician.  

## 2012-12-18 NOTE — Progress Notes (Signed)
Patient did not experience any of the following events: a burn prior to discharge; a fall within the facility; wrong site/side/patient/procedure/implant event; or a hospital transfer or hospital admission upon discharge from the facility. (G8907) Patient did not have preoperative order for IV antibiotic SSI prophylaxis. (G8918)  

## 2012-12-18 NOTE — Op Note (Signed)
[facility] [address] [place], [address]   COLONOSCOPY PROCEDURE REPORT  PATIENT: [name] [name], [name] [name].  MR#: [id number] BIRTHDATE: [birth year] , 71  yrs. old GENDER: Female ENDOSCOPIST: Iva Boop, MD, [facility] PROCEDURE DATE:  12/18/2012 PROCEDURE:   Colonoscopy, screening First Screening Colonoscopy - Avg.  risk and is 50 yrs.  old or older - No.  Prior Negative Screening - Now for repeat screening. Above average risk  History of Adenoma - Now for follow-up colonoscopy & has been > or = to 3 yrs.  N/A  Polyps Removed Today? No.  Recommend repeat exam, <10 yrs? Yes.  High risk (family or personal hx). ASA CLASS:   Class III INDICATIONS:elevated risk screening, Patient's immediate family history of colon cancer, and Patient's family history of colon polyps. MEDICATIONS: There was residual sedation effect present from prior procedure, propofol (Diprivan) 250mg  IV, MAC sedation, administered by CRNA, and These medications were titrated to patient response per physician's verbal order  DESCRIPTION OF PROCEDURE:   After the risks benefits and alternatives of the procedure were thoroughly explained, informed consent was obtained.  A digital rectal exam revealed no abnormalities of the rectum.   The LB [id number]  endoscope was introduced through the anus and advanced to the cecum, which was identified by both the appendix and ileocecal valve. No adverse events experienced.   The quality of the prep was Suprep good  The instrument was then slowly withdrawn as the colon was fully examined.    COLON FINDINGS: Moderate diverticulosis was noted in the sigmoid colon.   The colon mucosa was otherwise normal.   A right colon retroflexion was performed.  Retroflexed views revealed no abnormalities. The time to cecum=3 minutes 07 seconds.  Withdrawal time=8 minutes 29 seconds.  The scope was withdrawn and the procedure completed. COMPLICATIONS: There were no  complications.  ENDOSCOPIC IMPRESSION: 1.   Moderate diverticulosis was noted in the sigmoid colon 2.   The colon mucosa was otherwise normal - good prep  RECOMMENDATIONS: 1.  Repeat Colonscopy in 5-10 years. 2.   father was elderly so could go beyond 5 year follow-up - will discuss in 5 years   eSigned:  Iva Boop, MD, [facility] 12/18/2012 8:42 AM   cc: Simone Curia, Md and The Patient

## 2012-12-18 NOTE — Op Note (Signed)
[facility] [address] [place], [address]   ENDOSCOPY PROCEDURE REPORT  PATIENT: [name] [name], [name] [name].  MR#: [id number] BIRTHDATE: Jul 29, 1940 , 71  yrs. old GENDER: Female ENDOSCOPIST: Iva Boop, MD, [facility] PROCEDURE DATE:  12/18/2012 PROCEDURE:  EGD w/ biopsy ASA CLASS:     Class III INDICATIONS:  follow up of GERD. MEDICATIONS: propofol (Diprivan) 150mg  IV, MAC sedation, administered by CRNA, and These medications were titrated to patient response per physician's verbal order TOPICAL ANESTHETIC: none  DESCRIPTION OF PROCEDURE: After the risks benefits and alternatives of the procedure were thoroughly explained, informed consent was obtained.  The LB [id number] endoscope was introduced through the mouth and advanced to the second portion of the duodenum. Without limitations.  The instrument was slowly withdrawn as the mucosa was fully examined.     STOMACH: A 2 cm hiatal hernia was noted.   Multiple small sessile polyps were found in the gastric body and gastric fundus.  Multiple biopsies was performed using cold forceps.  Sample sent for histology.  The remainder of the upper endoscopy exam was otherwise normal. Retroflexed views revealed a hiatal hernia.     The scope was then withdrawn from the patient and the procedure completed.  COMPLICATIONS: There were no complications. ENDOSCOPIC IMPRESSION: 1.   2 cm hiatal hernia 2.   Multiple small sessile polyps were found in the gastric body and gastric fundus 3.   The remainder of the upper endoscopy exam was otherwise normal  RECOMMENDATIONS: 1.  Await pathology results 2.  Proceed with a Colonoscopy. 3.   Continue current medications of AM Nexium and nightly Zegerid   eSigned:  Iva Boop, MD, [facility] 12/18/2012 8:38 AM  CC:The Patient and Simone Curia, MD

## 2012-12-18 NOTE — Patient Instructions (Addendum)
The upper endoscopy revealed some stomach polyps that look benign and are usually of no clinical consequence. I took biopsies to be sure. You have a small hiatal hernia. Otherwise normal - NO BARRETT'S ESOPHAGUS.  The colonoscopy showed diverticulosis but was otherwise normal with a good prep.  I will let you know about the stomach polyp biopsies by mail. You can have another colonoscopy in 5-10 years. Please continue your current medication regimen.   I appreciate the opportunity to care for you. Iva Boop, MD, [facility]  Discharge instructions given with verbal understanding. Handouts on hiatal hernia and diverticulosis . Resume previous medications. YOU HAD AN ENDOSCOPIC PROCEDURE TODAY AT THE [facility]: Refer to the procedure report that was given to you for any specific questions about what was found during the examination.  If the procedure report does not answer your questions, please call your gastroenterologist to clarify.  If you requested that your care partner not be given the details of your procedure findings, then the procedure report has been included in a sealed envelope for you to review at your convenience later.  YOU SHOULD EXPECT: Some feelings of bloating in the abdomen. Passage of more gas than usual.  Walking can help get rid of the air that was put into your GI tract during the procedure and reduce the bloating. If you had a lower endoscopy (such as a colonoscopy or flexible sigmoidoscopy) you may notice spotting of blood in your stool or on the toilet paper. If you underwent a bowel prep for your procedure, then you may not have a normal bowel movement for a few days.  DIET: Your first meal following the procedure should be a light meal and then it is ok to progress to your normal diet.  A half-sandwich or bowl of soup is an example of a good first meal.  Heavy or fried foods are harder to digest and may make you feel nauseous or bloated.  Likewise meals  heavy in dairy and vegetables can cause extra gas to form and this can also increase the bloating.  Drink plenty of fluids but you should avoid alcoholic beverages for 24 hours.  ACTIVITY: Your care partner should take you home directly after the procedure.  You should plan to take it easy, moving slowly for the rest of the day.  You can resume normal activity the day after the procedure however you should NOT DRIVE or use heavy machinery for 24 hours (because of the sedation medicines used during the test).    SYMPTOMS TO REPORT IMMEDIATELY: A gastroenterologist can be reached at any hour.  During normal business hours, 8:30 AM to 5:00 PM Monday through Friday, call [phone].  After hours and on weekends, please call the GI answering service at [phone] who will take a message and have the physician on call contact you.   Following lower endoscopy (colonoscopy or flexible sigmoidoscopy):  Excessive amounts of blood in the stool  Significant tenderness or worsening of abdominal pains  Swelling of the abdomen that is new, acute  Fever of 100F or higher  Following upper endoscopy (EGD)  Vomiting of blood or coffee ground material  New chest pain or pain under the shoulder blades  Painful or persistently difficult swallowing  New shortness of breath  Fever of 100F or higher  Black, tarry-looking stools  FOLLOW UP: If any biopsies were taken you will be contacted by phone or by letter within the next 1-3 weeks.  Call  your gastroenterologist if you have not heard about the biopsies in 3 weeks.  Our staff will call the home number listed on your records the next business day following your procedure to check on you and address any questions or concerns that you may have at that time regarding the information given to you following your procedure. This is a courtesy call and so if there is no answer at the home number and we have not heard from you through the emergency physician on  call, we will assume that you have returned to your regular daily activities without incident.  SIGNATURES/CONFIDENTIALITY: You and/or your care partner have signed paperwork which will be entered into your electronic medical record.  These signatures attest to the fact that that the information above on your After Visit Summary has been reviewed and is understood.  Full responsibility of the confidentiality of this discharge information lies with you and/or your care-partner.

## 2012-12-18 NOTE — Progress Notes (Signed)
No egg or soy allergy. ewm No problems with past sedation. ewm 

## 2012-12-19 ENCOUNTER — Telehealth: Payer: Self-pay | Admitting: *Deleted

## 2012-12-19 NOTE — Telephone Encounter (Signed)
  Follow up Call-  Call back number 12/18/2012  Post procedure Call Back phone  # [phone]-home-pt may be at work-just LM  Permission to leave phone message Yes    No answer, left message to call if questions or concerns.

## 2012-12-25 ENCOUNTER — Encounter: Payer: Self-pay | Admitting: Internal Medicine

## 2013-02-06 ENCOUNTER — Ambulatory Visit (INDEPENDENT_AMBULATORY_CARE_PROVIDER_SITE_OTHER): Payer: Medicare Other | Admitting: Family Medicine

## 2013-02-06 ENCOUNTER — Encounter: Payer: Self-pay | Admitting: Family Medicine

## 2013-02-06 VITALS — BP 118/82 | Temp 98.7°F | Ht 63.5 in | Wt 141.2 lb

## 2013-02-06 DIAGNOSIS — J329 Chronic sinusitis, unspecified: Secondary | ICD-10-CM

## 2013-02-06 DIAGNOSIS — E785 Hyperlipidemia, unspecified: Secondary | ICD-10-CM | POA: Diagnosis not present

## 2013-02-06 MED ORDER — AMOXICILLIN 500 MG PO TABS: ORAL_TABLET | ORAL | Status: DC

## 2013-02-06 NOTE — Progress Notes (Signed)
  Subjective:    Patient ID: [name] [name], female    DOB: [birth year], 72 y.o.   MRN: [id number]  Sinusitis This is a new problem. The current episode started in the past 7 days. Associated symptoms include congestion, coughing, headaches, a hoarse voice and a sore throat. Past treatments include oral decongestants. The treatment provided mild relief.   Head feeling strange, and unsteady, felt dizzy at times  Sore throat, bad cough  Gunky phlegm  No fever resp illness intermittently, out in the cold this weekend energ level mucinex prn blue and white  Review of Systems  HENT: Positive for congestion, sore throat and hoarse voice.   Respiratory: Positive for cough.   Neurological: Positive for headaches.       Objective:   Physical Exam  Alert no apparent distress. Moderate malaise. HEENT moderate nasal congestion hoarse voice neck supple tender anterior nodes. Lungs clear. Heart regular in rhythm. Vitals reviewed.      Assessment & Plan:  Impression rhinosinusitis with secondary laryngitis. Plan a mocks 500 3 times a day 10 days. Symptomatic care discussed. Warning signs discussed. WSL

## 2013-02-07 DIAGNOSIS — E785 Hyperlipidemia, unspecified: Secondary | ICD-10-CM | POA: Diagnosis not present

## 2013-02-07 LAB — LIPID PANEL
Cholesterol: 217 mg/dL — ABNORMAL HIGH (ref 0–200)
Total CHOL/HDL Ratio: 4.6 Ratio
Triglycerides: 166 mg/dL — ABNORMAL HIGH (ref <150)
VLDL: 33 mg/dL (ref 0–40)

## 2013-02-11 ENCOUNTER — Encounter: Payer: Self-pay | Admitting: Family Medicine

## 2013-02-23 DIAGNOSIS — Z23 Encounter for immunization: Secondary | ICD-10-CM | POA: Diagnosis not present

## 2013-03-13 ENCOUNTER — Other Ambulatory Visit: Payer: Medicare Other | Admitting: Lab

## 2013-03-14 ENCOUNTER — Other Ambulatory Visit (HOSPITAL_BASED_OUTPATIENT_CLINIC_OR_DEPARTMENT_OTHER): Payer: Medicare Other | Admitting: Lab

## 2013-03-14 ENCOUNTER — Other Ambulatory Visit: Payer: Self-pay | Admitting: *Deleted

## 2013-03-14 DIAGNOSIS — Z87898 Personal history of other specified conditions: Secondary | ICD-10-CM

## 2013-03-14 LAB — LACTATE DEHYDROGENASE (CC13): LDH: 173 U/L (ref 125–245)

## 2013-03-14 LAB — CBC WITH DIFFERENTIAL/PLATELET
Eosinophils Absolute: 0.1 10*3/uL (ref 0.0–0.5)
HCT: 36.8 % (ref 34.8–46.6)
LYMPH%: 29.6 % (ref 14.0–49.7)
MCV: 90.9 fL (ref 79.5–101.0)
MONO#: 0.8 10*3/uL (ref 0.1–0.9)
MONO%: 11.1 % (ref 0.0–14.0)
NEUT#: 3.9 10*3/uL (ref 1.5–6.5)
NEUT%: 56.5 % (ref 38.4–76.8)
Platelets: 221 10*3/uL (ref 145–400)
WBC: 6.9 10*3/uL (ref 3.9–10.3)

## 2013-03-14 LAB — COMPREHENSIVE METABOLIC PANEL (CC13)
Alkaline Phosphatase: 95 U/L (ref 40–150)
Anion Gap: 9 mEq/L (ref 3–11)
BUN: 16.3 mg/dL (ref 7.0–26.0)
CO2: 27 mEq/L (ref 22–29)
Creatinine: 0.7 mg/dL (ref 0.6–1.1)
Glucose: 106 mg/dl (ref 70–140)
Total Bilirubin: <0.2 mg/dL (ref 0.20–1.20)

## 2013-03-15 ENCOUNTER — Other Ambulatory Visit: Payer: Medicare Other | Admitting: Lab

## 2013-03-15 ENCOUNTER — Ambulatory Visit (HOSPITAL_COMMUNITY)
Admission: RE | Admit: 2013-03-15 | Discharge: 2013-03-15 | Disposition: A | Discharge status: Home / Self Care | Payer: Medicare Other | Source: Ambulatory Visit | Attending: Oncology | Admitting: Oncology

## 2013-03-15 DIAGNOSIS — C8589 Other specified types of non-Hodgkin lymphoma, extranodal and solid organ sites: Secondary | ICD-10-CM | POA: Diagnosis not present

## 2013-03-15 DIAGNOSIS — K5730 Diverticulosis of large intestine without perforation or abscess without bleeding: Secondary | ICD-10-CM | POA: Diagnosis not present

## 2013-03-15 DIAGNOSIS — R911 Solitary pulmonary nodule: Secondary | ICD-10-CM | POA: Insufficient documentation

## 2013-03-15 DIAGNOSIS — Z9221 Personal history of antineoplastic chemotherapy: Secondary | ICD-10-CM | POA: Diagnosis not present

## 2013-03-15 DIAGNOSIS — R935 Abnormal findings on diagnostic imaging of other abdominal regions, including retroperitoneum: Secondary | ICD-10-CM | POA: Diagnosis not present

## 2013-03-15 DIAGNOSIS — C8590 Non-Hodgkin lymphoma, unspecified, unspecified site: Secondary | ICD-10-CM

## 2013-03-15 MED ORDER — BARIUM SULFATE 2 % PO SUSP
450.0000 mL | ORAL | Status: AC
  Filled 2013-03-15: qty 450

## 2013-03-15 MED ORDER — IOHEXOL 300 MG/ML  SOLN
100.0000 mL | Freq: Once | INTRAMUSCULAR | Status: AC | PRN
  Administered 2013-03-15: 100 mL via INTRAVENOUS

## 2013-03-15 MED ORDER — IOHEXOL 300 MG/ML  SOLN
25.0000 mL | INTRAMUSCULAR | Status: AC
  Administered 2013-03-15: 50 mL via INTRAVENOUS

## 2013-03-19 ENCOUNTER — Telehealth: Payer: Self-pay | Admitting: Internal Medicine

## 2013-03-19 NOTE — Telephone Encounter (Signed)
pt called and needed to r/s est to later time same day...done

## 2013-03-22 ENCOUNTER — Ambulatory Visit: Payer: Medicare Other | Admitting: Oncology

## 2013-03-22 ENCOUNTER — Ambulatory Visit: Payer: Medicare Other | Admitting: Physician Assistant

## 2013-03-22 ENCOUNTER — Ambulatory Visit: Payer: Medicare Other | Admitting: Internal Medicine

## 2013-03-22 ENCOUNTER — Telehealth: Payer: Self-pay | Admitting: Internal Medicine

## 2013-03-22 ENCOUNTER — Ambulatory Visit (HOSPITAL_BASED_OUTPATIENT_CLINIC_OR_DEPARTMENT_OTHER): Payer: Medicare Other | Admitting: Physician Assistant

## 2013-03-22 ENCOUNTER — Encounter: Payer: Self-pay | Admitting: Physician Assistant

## 2013-03-22 VITALS — BP 148/51 | HR 66 | Temp 98.1°F | Resp 18 | Ht 63.5 in | Wt 143.3 lb

## 2013-03-22 DIAGNOSIS — Z87898 Personal history of other specified conditions: Secondary | ICD-10-CM

## 2013-03-22 NOTE — Telephone Encounter (Signed)
gave pt appt for lab and MD on february 2015 °

## 2013-03-22 NOTE — Progress Notes (Addendum)
Hematology and Oncology Follow Up Visit SHARED VISIT  [name] [name] [id number] 1940-07-22 72 y.o. 03/22/2013 5:10 PM  Principle Diagnosis non-Hodgkin's lymphoma, low-grade lymphoma initially diagnosed in March of 2001 with evidence for progressive disease noted in February of 2004   Prior Therapy: Status post initial chemotherapy with mitoxantrone, fludarabine, and Rituxan completed January 2002. After the patient was found to have progressive disease in February 2004 she was treated with single agent Rituxan given February 200 4 through August 2005  Current therapy: Observation  Interim History:  Patient presents accompanied by her husband for her yearly followup regarding her history of non-Hodgkin's lymphoma, low-grade lymphoma initially diagnosed in March 2001 and treated as stated above. Previously followed by Dr. Donnie Coffin initially assigned to Dr. Gaylyn Rong, however she was reassigned to Dr. Asa Lente care when Dr. Gaylyn Rong left the practice. Overall she has done well health wise during this past year. She did have a sinus infection about 2 months ago and reports a cough related to the infection. She also complains of her voice getting worse daily. She has a history of gastroesophageal reflux disease and also complains of some burning sensation in her chest sometimes worse with exertion. She notes some nodules in her right upper extremity as well as her left side. The largest is in the right upper arm area. Dr. Donnie Coffin had a the patient obtain an ultrasound of this area that was done on 03/11/2011 revealing a 1.5 cm slightly hyperechoic nodule within the subcutaneous soft tissue, and presumably. She feels this area might be slightly larger and she noticed 2 new ones on her left side. She voiced no other specific complaints today. She denied any fever, chills, productive cough or night sweats. She has a remote history of smoking quitting in 1969 approximate 45 years ago. She spoke to the third to a half pack of  cigarettes daily at that time.    Current Outpatient Prescriptions on File Prior to Visit  Medication Sig Dispense Refill  . amoxicillin (AMOXIL) 500 MG tablet One tid for ten d  30 tablet  0  . aspirin EC 81 MG tablet Take 81 mg by mouth daily.      Marland Kitchen atenolol (TENORMIN) 25 MG tablet TAKE ONE (1) TABLET EACH DAY  30 tablet  5  . calcium carbonate (OS-CAL) 600 MG TABS Take 600 mg by mouth 2 (two) times daily with a meal.        . esomeprazole (NEXIUM) 20 MG capsule Take 20 mg by mouth daily before breakfast.      . fish oil-omega-3 fatty acids 1000 MG capsule Take 2 g by mouth daily.        . Multiple Vitamin (MULTIVITAMIN) tablet Take 1 tablet by mouth daily.        Maxwell Caul Bicarbonate (ZEGERID) 20-1100 MG CAPS Take 1 capsule by mouth every evening. After supper, before bedtime  28 each  0  . Probiotic Product ([employer] [employer]) CAPS Take 1 capsule by mouth daily.       No current facility-administered medications on file prior to visit.    Allergies:  Allergies  Allergen Reactions  . Codeine Nausea Only  . Iodine Hives  . Iohexol Hives     Desc: 50 mg benadryl prior to exam   . Pamine Forte [Methscopolamine] Other (See Comments)    Unable to urinate  . Robinul [Glycopyrrolate] Other (See Comments)    Unable to urinate  . Sulfa Antibiotics Swelling  . Sulfonamide Derivatives Swelling  Past Medical History  Diagnosis Date  . MVP (mitral valve prolapse)   . Brady-tachy syndrome   . HLD (hyperlipidemia)   . Palpitation   . Dyspepsia   . Vasovagal syncope   . Colitis, ischemic   . Fatty liver   . Osteopenia   . Diverticulosis of colon   . IBS (irritable bowel syndrome)   . Incontinence, feces   . Rectocele   . Hypertension   . Diverticulitis   . Rectocele   . nhl dx'd 07/1999    chemo comp 2001; rituxin comp 2005  . Colon polyp 07/29/1993    with focal adenomatous changes     Surgical history, Social history, and Family History were reviewed and  updated.  Review of Systems: Constitutional:  Negative for fever, chills, night sweats, anorexia, weight loss, pain. Cardiovascular: no chest pain or dyspnea on exertion Respiratory: positive for - cough Neurological: no TIA or stroke symptoms Dermatological: positive for Patient noticed a new subcutaneous nodules, affecting the right upper extremity and left side ENT: positive for - nasal congestion Skin Gastrointestinal: no abdominal pain, change in bowel habits, or black or bloody stools Genito-Urinary: no dysuria, trouble voiding, or hematuria Hematological and Lymphatic: negative Breast: negative Musculoskeletal: negative Remaining ROS negative.  Physical Exam: Blood pressure 148/51, pulse 66, temperature 98.1 F (36.7 C), temperature source Oral, resp. rate 18, height 5' 3.5" (1.613 m), weight 143 lb 4.8 oz (65 kg). ECOG:  General appearance: alert, cooperative, appears stated age and no distress Head: Normocephalic, without obvious abnormality, atraumatic Mouth: Clear, no evidence of thrush or mucositis. Membranes moist Neck: no adenopathy, no carotid bruit, no JVD, supple, symmetrical, trachea midline and thyroid not enlarged, symmetric, no tenderness/mass/nodules Lymph nodes: Cervical, supraclavicular, and axillary nodes normal. Resp: clear to auscultation bilaterally Cardio: regular rate and rhythm, S1, S2 normal, no murmur, click, rub or gallop GI: soft, non-tender; bowel sounds normal; no masses,  no organomegaly Extremities: extremities normal, atraumatic, no cyanosis or edema Skin: And the right upper medial biceps triceps region there is an approximately 2-2.2 cm firm nontender subcutaneous nodule also in the right upper extremity but in the medial aspect of the forearm there are 2 smaller firm subcutaneous nodules that are approximately a half centimeter in size, although these nodules are nontender. On the left lateral aspect of the mid abdomen there are another 2 small  subcutaneous nodules also about a half to three quarters of centimeter in size and nontender.   Lab Results: Lab Results  Component Value Date   WBC 6.9 03/14/2013   HGB 12.4 03/14/2013   HCT 36.8 03/14/2013   MCV 90.9 03/14/2013   PLT 221 03/14/2013     Chemistry      Component Value Date/Time   NA 142 03/14/2013 1558   NA 141 03/03/2011 1058   K 4.0 03/14/2013 1558   K 4.5 03/03/2011 1058   CL 105 01/20/2012 1021   CL 104 03/03/2011 1058   CO2 27 03/14/2013 1558   CO2 28 03/03/2011 1058   BUN 16.3 03/14/2013 1558   BUN 16 03/03/2011 1058   CREATININE 0.7 03/14/2013 1558   CREATININE 0.82 03/03/2011 1058      Component Value Date/Time   CALCIUM 10.5* 03/14/2013 1558   CALCIUM 10.6* 03/03/2011 1058   ALKPHOS 95 03/14/2013 1558   ALKPHOS 71 03/03/2011 1058   AST 22 03/14/2013 1558   AST 24 03/03/2011 1058   ALT 23 03/14/2013 1558   ALT 22 03/03/2011 1058  BILITOT <0.20 03/14/2013 1558   BILITOT 0.3 03/03/2011 1058       Radiological Studies: Ct Chest W Contrast  03/15/2013   CLINICAL DATA:  Non-Hodgkin's lymphoma diagnosed 2001. Chemotherapy completed.  EXAM: CT CHEST, ABDOMEN, AND PELVIS WITH CONTRAST  TECHNIQUE: Multidetector CT imaging of the chest, abdomen and pelvis was performed following the standard protocol during bolus administration of intravenous contrast.  CONTRAST:  1 OMNIPAQUE IOHEXOL 300 MG/ML SOLN, OMNIPAQUE IOHEXOL 300 MG/ML SOLN  COMPARISON:  CT 01/21/2012  FINDINGS: CT CHEST FINDINGS  No axillary or supraclavicular lymphadenopathy. No mediastinal hilar lymphadenopathy. No pericardial fluid. Review of the lung parenchyma demonstrates a 2.6 cm ground-glass opacity within the superior segment of the right lower lobe (image 30) which is not present on comparison exam.  CT ABDOMEN AND PELVIS FINDINGS  No focal hepatic lesion. The pancreas, spleen, adrenal glands, and kidneys are normal. Extrarenal pelves are noted.  The stomach, small bowel, appendix,  and cecum are normal. The colon demonstrates multiple diverticula through the sigmoid region without acute inflammation.  Abdominal aorta is normal caliber. No retroperitoneal periportal lymphadenopathy.  No free fluid the pelvis. Post hysterectomy anatomy. No pelvic lymphadenopathy. No inguinal lymphadenopathy. Review of bone windows demonstrates no aggressive osseous lesions.  IMPRESSION: 1. No lymphadenopathy in the chest, abdomen, or pelvis. Normal volume spleen.  2. New ground-glass nodule in the right lower lobe. Differential includes inflammatory infectious process versus less likely neoplasm. Recommend CT thorax without contrast in 3 months for evaluation.   Electronically Signed   By: Genevive Bi M.D.   On: 03/15/2013 17:00   Ct Abdomen Pelvis W Contrast  03/15/2013   CLINICAL DATA:  Non-Hodgkin's lymphoma diagnosed 2001. Chemotherapy completed.  EXAM: CT CHEST, ABDOMEN, AND PELVIS WITH CONTRAST  TECHNIQUE: Multidetector CT imaging of the chest, abdomen and pelvis was performed following the standard protocol during bolus administration of intravenous contrast.  COMPARISON:  CT 01/21/2012  FINDINGS: CT CHEST FINDINGS  No axillary or supraclavicular lymphadenopathy. No mediastinal hilar lymphadenopathy. No pericardial fluid. Review of the lung parenchyma demonstrates a 2.6 cm ground-glass opacity within the superior segment of the right lower lobe (image 30) which is not present on comparison exam.  CT ABDOMEN AND PELVIS FINDINGS  No focal hepatic lesion. The pancreas, spleen, adrenal glands, and kidneys are normal. Extrarenal pelves are noted.  The stomach, small bowel, appendix, and cecum are normal. The colon demonstrates multiple diverticula through the sigmoid region without acute inflammation.  Abdominal aorta is normal caliber. No retroperitoneal periportal lymphadenopathy.  No free fluid the pelvis. Post hysterectomy anatomy. No pelvic lymphadenopathy. No inguinal lymphadenopathy. Review of  bone windows demonstrates no aggressive osseous lesions.  IMPRESSION: 1. No lymphadenopathy in the chest, abdomen, or pelvis. Normal volume spleen.  2. New ground-glass nodule in the right lower lobe. Differential includes inflammatory infectious process versus less likely neoplasm. Recommend CT thorax without contrast in 3 months for evaluation.  : 1 OMNIPAQUE IOHEXOL 300 MG/ML SOLN, OMNIPAQUE IOHEXOL 300 MG/ML SOLN   Electronically Signed   By: Genevive Bi M.D.   On: 03/15/2013 12:39   Impression and Plan: Patient is a very pleasant 72 year old white female diagnosed with non-Hodgkin's lymphoma, low-grade lymphoma in March of 2001. She is status post initial therapy with mitoxantrone, fludarabine and Rituxan completing this therapy in January 2002 with progressive disease noted in February 2004 treated with single agent Rituxan from February 2004 through August of 2005. She's been on observation since that time without any  evidence of disease recurrence. Patient was discussed with him also seen by Dr. Arbutus Ped. He reviewed her CT scan which revealed no lymphadenopathy in the chest, abdomen or pelvis with normal volume spleen. There is new groundglass nodule the right lower lobe. Differential includes inflammatory infectious process versus less likely neoplasm. Per Dr. Genevive Bi who read the CT scans he recommends a CT of the thorax without contrast in 3 months for evaluation. I confirmed that the CT was to be done without contrast as Dr. Peterson Lombard stated that to reevaluate this area contrast would not be necessary. She'll followup with Dr. Arbutus Ped in 3 months with repeat CBC differential, C. met and a CT of the chest without contrast to reevaluate the new groundglass nodule in the right lower lobe  Regarding the subcutaneous non-tender nodules. The patient is advised to monitor these areas and notify us if more develop or the current nodules significantly increase in size. Patient voiced  understanding  Tamitha Norell E, PA-C 11/20/20145:10 PM  ADDENDUM: Hematology/Oncology Attending: I had a face to face encounter with the patient. I recommended her care plan. This is a very pleasant 72 years old white female with history of non-Hodgkin lymphoma, low-grade lymphoma diagnosed in March of 2001, status post treatment with mitoxantrone, fludarabine and Rituxan. She had evidence for disease recurrence in April 2004 and the patient was treated with single agent Rituxan completed in August of 2005 and has been observation since that time. The patient was followed be tested by Dr. Donnie Coffin. She is here today to establish care with me after Dr. Donnie Coffin left the practice. Recent CT scan of the chest, abdomen and pelvis showed no significant lymphadenopathy in the chest, abdomen or pelvis but there is new groundglass nodule in the right lower lobe questionable for inflammatory process and less likely neoplasm. I discussed the scan results and showed the images to the patient and her husband. I recommended for her close observation with repeat CT scan of the chest in 3 months for evaluation of the groundglass opacity. The patient and her husband agreed to the current plan. She was advised to call immediately if she has any concerning symptoms in the interval. Lajuana Matte., MD 03/25/2013

## 2013-03-23 ENCOUNTER — Encounter: Payer: Self-pay | Admitting: Physician Assistant

## 2013-03-23 NOTE — Patient Instructions (Signed)
Follow up with Dr. Arbutus Ped in 3 months with a restaging CT scan of your chest to re-evaluate your disease

## 2013-05-23 DIAGNOSIS — H35349 Macular cyst, hole, or pseudohole, unspecified eye: Secondary | ICD-10-CM | POA: Diagnosis not present

## 2013-05-23 DIAGNOSIS — H524 Presbyopia: Secondary | ICD-10-CM | POA: Diagnosis not present

## 2013-05-23 DIAGNOSIS — H52229 Regular astigmatism, unspecified eye: Secondary | ICD-10-CM | POA: Diagnosis not present

## 2013-05-23 DIAGNOSIS — H5210 Myopia, unspecified eye: Secondary | ICD-10-CM | POA: Diagnosis not present

## 2013-05-26 ENCOUNTER — Other Ambulatory Visit: Payer: Self-pay | Admitting: Cardiology

## 2013-06-22 ENCOUNTER — Ambulatory Visit (HOSPITAL_COMMUNITY)
Admission: RE | Admit: 2013-06-22 | Discharge: 2013-06-22 | Disposition: A | Discharge status: Home / Self Care | Payer: Medicare Other | Source: Ambulatory Visit | Attending: Physician Assistant | Admitting: Physician Assistant

## 2013-06-22 ENCOUNTER — Encounter (HOSPITAL_COMMUNITY): Payer: Self-pay

## 2013-06-22 ENCOUNTER — Other Ambulatory Visit (HOSPITAL_BASED_OUTPATIENT_CLINIC_OR_DEPARTMENT_OTHER): Payer: Medicare Other

## 2013-06-22 ENCOUNTER — Encounter (INDEPENDENT_AMBULATORY_CARE_PROVIDER_SITE_OTHER): Payer: Self-pay

## 2013-06-22 DIAGNOSIS — K449 Diaphragmatic hernia without obstruction or gangrene: Secondary | ICD-10-CM | POA: Insufficient documentation

## 2013-06-22 DIAGNOSIS — J8410 Pulmonary fibrosis, unspecified: Secondary | ICD-10-CM | POA: Insufficient documentation

## 2013-06-22 DIAGNOSIS — Z87898 Personal history of other specified conditions: Secondary | ICD-10-CM | POA: Diagnosis not present

## 2013-06-22 DIAGNOSIS — M5124 Other intervertebral disc displacement, thoracic region: Secondary | ICD-10-CM | POA: Diagnosis not present

## 2013-06-22 DIAGNOSIS — C8589 Other specified types of non-Hodgkin lymphoma, extranodal and solid organ sites: Secondary | ICD-10-CM | POA: Diagnosis not present

## 2013-06-22 LAB — CBC WITH DIFFERENTIAL/PLATELET
BASO%: 0.5 % (ref 0.0–2.0)
Basophils Absolute: 0 10*3/uL (ref 0.0–0.1)
EOS ABS: 0.2 10*3/uL (ref 0.0–0.5)
EOS%: 2.1 % (ref 0.0–7.0)
HEMATOCRIT: 43 % (ref 34.8–46.6)
HGB: 14.4 g/dL (ref 11.6–15.9)
LYMPH%: 29.3 % (ref 14.0–49.7)
MCH: 30.5 pg (ref 25.1–34.0)
MCHC: 33.5 g/dL (ref 31.5–36.0)
MCV: 91 fL (ref 79.5–101.0)
MONO#: 0.8 10*3/uL (ref 0.1–0.9)
MONO%: 11.1 % (ref 0.0–14.0)
NEUT%: 57 % (ref 38.4–76.8)
NEUTROS ABS: 4.2 10*3/uL (ref 1.5–6.5)
Platelets: 256 10*3/uL (ref 145–400)
RBC: 4.72 10*6/uL (ref 3.70–5.45)
RDW: 12.9 % (ref 11.2–14.5)
WBC: 7.4 10*3/uL (ref 3.9–10.3)
lymph#: 2.2 10*3/uL (ref 0.9–3.3)

## 2013-06-22 LAB — COMPREHENSIVE METABOLIC PANEL (CC13)
ALT: 21 U/L (ref 0–55)
ANION GAP: 9 meq/L (ref 3–11)
AST: 23 U/L (ref 5–34)
Albumin: 4.3 g/dL (ref 3.5–5.0)
Alkaline Phosphatase: 95 U/L (ref 40–150)
BUN: 19.6 mg/dL (ref 7.0–26.0)
CALCIUM: 10.9 mg/dL — AB (ref 8.4–10.4)
CO2: 28 meq/L (ref 22–29)
CREATININE: 0.8 mg/dL (ref 0.6–1.1)
Chloride: 106 mEq/L (ref 98–109)
GLUCOSE: 94 mg/dL (ref 70–140)
Potassium: 4.7 mEq/L (ref 3.5–5.1)
Sodium: 143 mEq/L (ref 136–145)
TOTAL PROTEIN: 7.1 g/dL (ref 6.4–8.3)
Total Bilirubin: 0.37 mg/dL (ref 0.20–1.20)

## 2013-06-22 LAB — LACTATE DEHYDROGENASE (CC13): LDH: 180 U/L (ref 125–245)

## 2013-06-25 ENCOUNTER — Ambulatory Visit (HOSPITAL_BASED_OUTPATIENT_CLINIC_OR_DEPARTMENT_OTHER): Payer: Medicare Other | Admitting: Internal Medicine

## 2013-06-25 ENCOUNTER — Other Ambulatory Visit: Payer: Medicare Other | Admitting: Lab

## 2013-06-25 ENCOUNTER — Telehealth: Payer: Self-pay | Admitting: Internal Medicine

## 2013-06-25 ENCOUNTER — Encounter: Payer: Self-pay | Admitting: Internal Medicine

## 2013-06-25 VITALS — BP 135/60 | HR 66 | Temp 97.5°F | Resp 20 | Ht 63.5 in | Wt 142.3 lb

## 2013-06-25 DIAGNOSIS — Z87898 Personal history of other specified conditions: Secondary | ICD-10-CM | POA: Diagnosis not present

## 2013-06-25 DIAGNOSIS — K5730 Diverticulosis of large intestine without perforation or abscess without bleeding: Secondary | ICD-10-CM

## 2013-06-25 NOTE — Telephone Encounter (Signed)
Gave pt appt for lab and MD on February 2016

## 2013-06-25 NOTE — Progress Notes (Signed)
[name] [name]  [phone] Fax:([phone]) [place] [phone] October 24, 1940 73 y.o. 06/25/2013 1:45 PM  Principle Diagnosis non-Hodgkin's lymphoma, low-grade lymphoma initially diagnosed in March of 2001 with evidence for progressive disease noted in February of 2004   Prior Therapy: Status post initial chemotherapy with mitoxantrone, fludarabine, and Rituxan completed January 2002. After the patient was found to have progressive disease in February 2004 she was treated with single agent Rituxan given February 200 4 through August 2005  Current therapy: Observation  INTERVAL HISTORY: This is a very pleasant 73 years old white female returns to the clinic today accompanied by her husband for followup visit. She was seen for initial evaluation by me on 03/22/2013 for annual followup visit and evaluation of history of non-Hodgkin lymphoma. A CT scan of the chest at that time showed groundglass opacity concerning for low-grade carcinoma. The patient has repeat CT scan of the chest performed recently and she is here for evaluation and discussion of her scan results. She denied having any significant chest pain but has shortness breath with exertion with no cough or hemoptysis. She denied having any nausea or vomiting. She has no significant weight loss or night sweats.   Current Outpatient Prescriptions on File Prior to Visit  Medication Sig Dispense Refill  . aspirin EC 81 MG tablet Take 81 mg by mouth daily.      Marland Kitchen atenolol (TENORMIN) 25 MG tablet TAKE ONE (1) TABLET EACH DAY  30 tablet  3  . calcium carbonate (OS-CAL) 600 MG TABS Take 600 mg by mouth 2 (two) times daily with a meal.        . esomeprazole (NEXIUM) 20 MG capsule Take 20 mg by mouth daily before breakfast.      . fish oil-omega-3 fatty acids 1000 MG capsule Take 2 g by mouth daily.        . Multiple Vitamin (MULTIVITAMIN) tablet Take 1 tablet by mouth daily.        Earney Navy Bicarbonate (ZEGERID) 20-1100 MG CAPS Take 1 capsule by mouth every evening. After supper, before bedtime  28 each  0  . Probiotic Product ([place]) CAPS Take 1 capsule by mouth daily.       No current facility-administered medications on file prior to visit.    Allergies:  Allergies  Allergen Reactions  . Codeine Nausea Only  . Iodine Hives  . Iohexol Hives     Desc: 50 mg benadryl prior to exam   . Pamine Forte [Methscopolamine] Other (See Comments)    Unable to urinate  . Robinul [Glycopyrrolate] Other (See Comments)    Unable to urinate  . Sulfa Antibiotics Swelling  . Sulfonamide Derivatives Swelling   Past Medical History  Diagnosis Date  . MVP (mitral valve prolapse)   . Brady-tachy syndrome   . HLD (hyperlipidemia)   . Palpitation   . Dyspepsia   . Vasovagal syncope   . Colitis, ischemic   . Fatty liver   . Osteopenia   . Diverticulosis of colon   . IBS (irritable bowel syndrome)   . Incontinence, feces   . Rectocele   . Hypertension   . Diverticulitis   . Rectocele   . nhl dx'd 07/1999    chemo comp 2001; rituxin comp 2005  . Colon polyp 07/29/1993    with focal adenomatous changes     Surgical history, Social history, and Family History were reviewed and updated.  Review of Systems: Constitutional:  Negative for fever, chills, night sweats, anorexia, weight loss, pain. Cardiovascular: no chest pain or dyspnea on exertion Respiratory: positive for - cough Neurological: no TIA or stroke symptoms Dermatological: positive for Patient noticed a new subcutaneous nodules, affecting the right upper extremity and left side ENT: positive for - nasal congestion Skin Gastrointestinal: no abdominal pain, change in bowel habits, or black or bloody stools Genito-Urinary: no dysuria, trouble voiding, or hematuria Hematological and Lymphatic: negative Breast: negative Musculoskeletal: negative Remaining ROS negative.  Physical  Exam: Blood pressure 135/60, pulse 66, temperature 97.5 F (36.4 C), temperature source Oral, resp. rate 20, height 5' 3.5" (1.613 m), weight 142 lb 4.8 oz (64.547 kg). ECOG:  General appearance: alert, cooperative, appears stated age and no distress Head: Normocephalic, without obvious abnormality, atraumatic Mouth: Clear, no evidence of thrush or mucositis. Membranes moist Neck: no adenopathy, no carotid bruit, no JVD, supple, symmetrical, trachea midline and thyroid not enlarged, symmetric, no tenderness/mass/nodules Lymph nodes: Cervical, supraclavicular, and axillary nodes normal. Resp: clear to auscultation bilaterally Cardio: regular rate and rhythm, S1, S2 normal, no murmur, click, rub or gallop GI: soft, non-tender; bowel sounds normal; no masses,  no organomegaly Extremities: extremities normal, atraumatic, no cyanosis or edema Skin: And the right upper medial biceps triceps region there is an approximately 2-2.2 cm firm nontender subcutaneous nodule also in the right upper extremity but in the medial aspect of the forearm there are 2 smaller firm subcutaneous nodules that are approximately a half centimeter in size, although these nodules are nontender. On the left lateral aspect of the mid abdomen there are another 2 small subcutaneous nodules also about a half to three quarters of centimeter in size and nontender.   Lab Results: Lab Results  Component Value Date   WBC 7.4 06/22/2013   HGB 14.4 06/22/2013   HCT 43.0 06/22/2013   MCV 91.0 06/22/2013   PLT 256 06/22/2013     Chemistry      Component Value Date/Time   NA 143 06/22/2013 1034   NA 141 03/03/2011 1058   K 4.7 06/22/2013 1034   K 4.5 03/03/2011 1058   CL 105 01/20/2012 1021   CL 104 03/03/2011 1058   CO2 28 06/22/2013 1034   CO2 28 03/03/2011 1058   BUN 19.6 06/22/2013 1034   BUN 16 03/03/2011 1058   CREATININE 0.8 06/22/2013 1034   CREATININE 0.82 03/03/2011 1058      Component Value Date/Time   CALCIUM 10.9*  06/22/2013 1034   CALCIUM 10.6* 03/03/2011 1058   ALKPHOS 95 06/22/2013 1034   ALKPHOS 71 03/03/2011 1058   AST 23 06/22/2013 1034   AST 24 03/03/2011 1058   ALT 21 06/22/2013 1034   ALT 22 03/03/2011 1058   BILITOT 0.37 06/22/2013 1034   BILITOT 0.3 03/03/2011 1058       Radiological Studies:  Ct Chest Wo Contrast  06/22/2013   CLINICAL DATA:  Non-Hodgkin's lymphoma. Followup ground-glass opacity.  EXAM: CT CHEST WITHOUT CONTRAST  TECHNIQUE: Multidetector CT imaging of the chest was performed following the standard protocol without IV contrast.  COMPARISON:  Chest CT 03/15/2013.  FINDINGS: The chest wall is unremarkable. No breast masses, supraclavicular or axillary adenopathy. Small scattered lymph nodes are stable. The thyroid gland is normal. The bony thorax is intact. No destructive bone lesions or spinal canal compromise. There is a T11-12 rim calcified disc protrusion with mild mass effect on the thecal sac. There is also a T12-L1 disc protrusion.  The heart is normal in size. No pericardial effusion. No mediastinal or hilar mass or adenopathy. The aorta is normal in caliber. The esophagus is grossly normal. A small hiatal hernia is noted.  Examination of the lung parenchyma demonstrates stable apical scarring changes. No infiltrates, edema or effusions. The of the ground-glass opacity in the superior segment of the right lower lobe has resolved and was most likely inflammatory change. There is a stable calcified granuloma in the right upper lobe posteriorly. No worrisome pulmonary nodules or mass lesions. No pleural effusion.  The upper abdomen is unremarkable and stable.  IMPRESSION: Resolution of the superior segment right lower lobe ground-glass opacity which was likely inflammatory change.  Stable calcified granuloma in the right upper lobe.  No mediastinal or hilar mass or adenopathy.  Small hiatal hernia.   Electronically Signed   By: Kalman Jewels M.D.   On: 06/22/2013 13:10    ASSESSMENT AND PLAN: This is a very pleasant 73 years old white female with history of non-Hodgkin lymphoma, low-grade lymphoma diagnosed in March of 2001, status post treatment with mitoxantrone, fludarabine and Rituxan. She had evidence for disease recurrence in April 2004 and the patient was treated with single agent Rituxan completed in August of 2005 and has been observation since that time. Her previous CT scan of the chest, abdomen and pelvis showed no significant lymphadenopathy in the chest, abdomen or pelvis but there is new groundglass nodule in the right lower lobe questionable for inflammatory process and likely neoplasm.  Repeat CT scan of the chest performed a few days ago showed resolution of the groundglass nodules. I discussed the scan results with the patient and her husband. I recommended for her to continue on observation with repeat CT scan of the chest, abdomen and pelvis in one year for restaging of her non-Hodgkin lymphoma. She was advised to call immediately if she has any concerning symptoms in the interval. The patient and her husband agreed to the current plan.  Disclaimer: This note was dictated with voice recognition software. Similar sounding words can inadvertently be transcribed and may not be corrected upon review.  Eilleen Kempf., MD 06/25/2013

## 2013-06-26 ENCOUNTER — Telehealth: Payer: Self-pay | Admitting: Internal Medicine

## 2013-06-26 NOTE — Telephone Encounter (Signed)
pt called to r/s lab....done °

## 2013-07-10 DIAGNOSIS — Z01419 Encounter for gynecological examination (general) (routine) without abnormal findings: Secondary | ICD-10-CM | POA: Diagnosis not present

## 2013-07-16 DIAGNOSIS — H35429 Microcystoid degeneration of retina, unspecified eye: Secondary | ICD-10-CM | POA: Diagnosis not present

## 2013-07-16 DIAGNOSIS — H2510 Age-related nuclear cataract, unspecified eye: Secondary | ICD-10-CM | POA: Diagnosis not present

## 2013-07-16 DIAGNOSIS — H43819 Vitreous degeneration, unspecified eye: Secondary | ICD-10-CM | POA: Diagnosis not present

## 2013-07-16 DIAGNOSIS — H40059 Ocular hypertension, unspecified eye: Secondary | ICD-10-CM | POA: Diagnosis not present

## 2013-07-19 ENCOUNTER — Encounter: Payer: Self-pay | Admitting: Family Medicine

## 2013-07-19 ENCOUNTER — Ambulatory Visit (INDEPENDENT_AMBULATORY_CARE_PROVIDER_SITE_OTHER): Payer: Medicare Other | Admitting: Family Medicine

## 2013-07-19 VITALS — BP 104/68 | Temp 97.8°F | Ht 63.0 in | Wt 142.0 lb

## 2013-07-19 DIAGNOSIS — J329 Chronic sinusitis, unspecified: Secondary | ICD-10-CM

## 2013-07-19 MED ORDER — AMOXICILLIN 500 MG PO CAPS: 500.0000 mg | ORAL_CAPSULE | Freq: Three times a day (TID) | ORAL | Status: DC

## 2013-07-19 NOTE — Progress Notes (Signed)
   Subjective:    Patient ID: [name], female    DOB: 1941/03/02, 73 y.o.   MRN: [id number]  Cough This is a new problem. The current episode started yesterday. Associated symptoms include headaches. Associated symptoms comments: Runny nose, sneezing, scratchy throat.    No sig fever,  No vom or diarrhea  No other sickness at hnme  mucinex blue and whit e pill   Cough genrated by drainga  Review of Systems  Respiratory: Positive for cough.   Neurological: Positive for headaches.   otherwise negative     Objective:   Physical Exam  Alert mild malaise. TMs retracted. Positive nasal discharge. Frontal tenderness. Pharynx erythematous neck supple. Lungs clear heart regular in rhythm.      Assessment & Plan:  Impression rhinosinusitis with early bronchitis plan antibiotics prescribed. Symptomatic care discussed. Warning signs discussed. WSL

## 2013-08-20 ENCOUNTER — Encounter: Payer: Self-pay | Admitting: Family Medicine

## 2013-08-20 ENCOUNTER — Ambulatory Visit (INDEPENDENT_AMBULATORY_CARE_PROVIDER_SITE_OTHER): Payer: Medicare Other | Admitting: Family Medicine

## 2013-08-20 VITALS — BP 122/82 | Temp 98.6°F | Ht 63.0 in | Wt 142.0 lb

## 2013-08-20 DIAGNOSIS — J310 Chronic rhinitis: Secondary | ICD-10-CM

## 2013-08-20 DIAGNOSIS — J309 Allergic rhinitis, unspecified: Secondary | ICD-10-CM | POA: Diagnosis not present

## 2013-08-20 DIAGNOSIS — J329 Chronic sinusitis, unspecified: Secondary | ICD-10-CM

## 2013-08-20 MED ORDER — CEFDINIR 300 MG PO CAPS: 300.0000 mg | ORAL_CAPSULE | Freq: Two times a day (BID) | ORAL | Status: DC

## 2013-08-20 NOTE — Progress Notes (Signed)
   Subjective:    Patient ID: [name] [name], female    DOB: 1940-11-15, 73 y.o.   MRN: [id number]  Cough This is a new problem. Episode onset: Friday  The cough is productive of sputum. Associated symptoms include ear congestion, nasal congestion, postnasal drip, rhinorrhea and a sore throat. Nothing aggravates the symptoms. She has tried OTC cough suppressant for the symptoms. The treatment provided mild relief.   Pt uses allegra plus plain mucines  Patient does get allergies intermittently. Review of Systems  HENT: Positive for postnasal drip, rhinorrhea and sore throat.   Respiratory: Positive for cough.    no vomiting no diarrhea     Objective:   Physical Exam Alert moderate malaise. Vitals reviewed. HEENT moderate nasal congestion frontal tenderness frank normal neck supple. Lungs bronchial cough no wheezes or crackles heart regular in rhythm.       Assessment & Plan:  Impression 1 allergic rhinitis #2 bronchitis/sinusitis plan maintain Allegra. Add Omnicef twice a day 10 days. Symptomatic care discussed. WSL

## 2013-09-17 ENCOUNTER — Other Ambulatory Visit: Payer: Self-pay | Admitting: Cardiology

## 2013-09-28 ENCOUNTER — Encounter: Payer: Medicare Other | Admitting: Cardiology

## 2013-10-01 ENCOUNTER — Encounter: Payer: Self-pay | Admitting: Cardiology

## 2013-10-01 ENCOUNTER — Ambulatory Visit (INDEPENDENT_AMBULATORY_CARE_PROVIDER_SITE_OTHER): Payer: Medicare Other | Admitting: Cardiology

## 2013-10-01 VITALS — BP 122/64 | HR 80 | Ht 63.0 in | Wt 142.0 lb

## 2013-10-01 DIAGNOSIS — R0609 Other forms of dyspnea: Secondary | ICD-10-CM | POA: Insufficient documentation

## 2013-10-01 DIAGNOSIS — R0989 Other specified symptoms and signs involving the circulatory and respiratory systems: Secondary | ICD-10-CM

## 2013-10-01 DIAGNOSIS — R0600 Dyspnea, unspecified: Secondary | ICD-10-CM | POA: Insufficient documentation

## 2013-10-01 NOTE — Patient Instructions (Signed)
Your physician has requested that you have en exercise stress myoview. For further information please visit [email or web address]. Please follow instruction sheet, as given.  Your physician recommends that you continue on your current medications as directed. Please refer to the Current Medication list given to you today.  Your physician recommends that you schedule a follow-up appointment in: few days after your [facility]

## 2013-10-01 NOTE — Progress Notes (Signed)
Patient ID: [name] [name], female   DOB: [birth year], 73 y.o.   MRN: [id number]     Patient Name: [name] [name] Date of Encounter: 10/01/2013  Primary Care Provider:  [name] Primary Cardiologist:  Dorothy Spark (previously Dr Verl Blalock)  Problem List   Past Medical History  Diagnosis Date  . MVP (mitral valve prolapse)   . Brady-tachy syndrome   . HLD (hyperlipidemia)   . Palpitation   . Dyspepsia   . Vasovagal syncope   . Colitis, ischemic   . Fatty liver   . Osteopenia   . Diverticulosis of colon   . IBS (irritable bowel syndrome)   . Incontinence, feces   . Rectocele   . Hypertension   . Diverticulitis   . Rectocele   . nhl dx'd 07/1999    chemo comp 2001; rituxin comp 2005  . Colon polyp 07/29/1993    with focal adenomatous changes   Past Surgical History  Procedure Laterality Date  . Pelvic prolapse repair    . Abdominal hysterectomy    . Bladder suspension    . Bunionectomy    . Cholecystectomy  2003  . Upper gastrointestinal endoscopy  10/2005    hiatal hernia  . Colonoscopy  7/200/, 11/2007    diverticulosis  . Abdominal surgery      lymph node removal    Allergies  Allergies  Allergen Reactions  . Codeine Nausea Only  . Iodine Hives  . Iohexol Hives     Desc: 50 mg benadryl prior to exam   . Pamine Forte [Methscopolamine] Other (See Comments)    Unable to urinate  . Robinul [Glycopyrrolate] Other (See Comments)    Unable to urinate  . Sulfa Antibiotics Swelling  . Sulfonamide Derivatives Swelling    HPI  Mrs Luebbe returns today for evaluation and management of some chest burning with exercise or walking. She was given sublingual nitroglycerin to take prior walking by Dr. Carlean Purl. She denies any chest pressure heaviness or typical angina. The burning does go away with stopping. She does have a history of prolapse and atypical chest pain. We performed a stress nuclear study in June 2012 which was negative for ischemia with  normal ejection fraction. She has not taken a nitroglycerin for fear it would give her a headache. She denies palpitations presyncope or syncope. Other than her age, she has no cardiac risk factors for coronary disease. She does have a history of reflux.  The patient has been experiencing worsening of dyspnea on exertion. Last year. She states that she has been always a walker but now her dyspnea is so bad that for the last month she hasn't been walking at all. She denies any chest pain. But her dyspnea would be her limiting factor when walking. She denies any palpitations, orthopnea, personal nocturnal dyspnea or lower extremity edema.  Home Medications  Prior to Admission medications   Medication Sig Start Date End Date Taking? Authorizing Provider  aspirin EC 81 MG tablet Take 81 mg by mouth daily.   Yes Historical Provider, MD  atenolol (TENORMIN) 25 MG tablet TAKE ONE (1) TABLET EACH DAY   Yes Dorothy Spark, MD  calcium carbonate (OS-CAL) 600 MG TABS Take 600 mg by mouth 2 (two) times daily with a meal.     Yes Historical Provider, MD  esomeprazole (NEXIUM) 20 MG capsule Take 20 mg by mouth daily before breakfast.   Yes Historical Provider, MD  fish oil-omega-3 fatty acids 1000 MG capsule  Take 2 g by mouth daily.     Yes Historical Provider, MD  Multiple Vitamin (MULTIVITAMIN) tablet Take 1 tablet by mouth daily.     Yes Historical Provider, MD  Probiotic Product (Vanlue) CAPS Take 1 capsule by mouth daily. 07/24/12  Yes Gatha Mayer, MD    Family History  Family History  Problem Relation Age of Onset  . Colon cancer Father 45  . Prostate cancer Brother   . Colon polyps Brother   . Colon polyps Brother     Social History  History   Social History  . Marital Status: Married    Spouse Name: N/A    Number of Children: 1  . Years of Education: N/A   Occupational History  . Retired   .     Social History Main Topics  . Smoking status: Former Smoker --  0.33 packs/day    Quit date: 05/04/1967  . Smokeless tobacco: Never Used     Comment: quit 1976  . Alcohol Use: No  . Drug Use: No  . Sexual Activity: Yes    Birth Control/ Protection: Surgical   Other Topics Concern  . Not on file   Social History Narrative  . No narrative on file     Review of Systems, as per HPI, otherwise negative General:  No chills, fever, night sweats or weight changes.  Cardiovascular:  No chest pain, dyspnea on exertion, edema, orthopnea, palpitations, paroxysmal nocturnal dyspnea. Dermatological: No rash, lesions/masses Respiratory: No cough, dyspnea Urologic: No hematuria, dysuria Abdominal:   No nausea, vomiting, diarrhea, bright red blood per rectum, melena, or hematemesis Neurologic:  No visual changes, wkns, changes in mental status. All other systems reviewed and are otherwise negative except as noted above.  Physical Exam  Blood pressure 122/64, pulse 80, height _0  (1.6 m), weight 142 lb (64.411 kg).  General: Pleasant, NAD Psych: Normal affect. Neuro: Alert and oriented X 3. Moves all extremities spontaneously. HEENT: Normal  Neck: Supple without bruits or JVD. Lungs:  Resp regular and unlabored, CTA. Heart: RRR no s3, s4, midsystolic click followed by a soft murmur.. Abdomen: Soft, non-tender, non-distended, BS + x 4.  Extremities: No clubbing, cyanosis or edema. DP/PT/Radials 2+ and equal bilaterally.  Labs:  No results found for this basename: CKTOTAL, CKMB, TROPONINI,  in the last 72 hours Lab Results  Component Value Date   WBC 7.4 06/22/2013   HGB 14.4 06/22/2013   HCT 43.0 06/22/2013   MCV 91.0 06/22/2013   PLT 256 06/22/2013    No results found for this basename: DDIMER   No components found with this basename: POCBNP,     Component Value Date/Time   NA 143 06/22/2013 1034   NA 141 03/03/2011 1058   K 4.7 06/22/2013 1034   K 4.5 03/03/2011 1058   CL 105 01/20/2012 1021   CL 104 03/03/2011 1058   CO2 28 06/22/2013 1034     CO2 28 03/03/2011 1058   GLUCOSE 94 06/22/2013 1034   GLUCOSE 116* 02/06/2013 0806   GLUCOSE 91 01/20/2012 1021   BUN 19.6 06/22/2013 1034   BUN 16 03/03/2011 1058   CREATININE 0.8 06/22/2013 1034   CREATININE 0.82 03/03/2011 1058   CALCIUM 10.9* 06/22/2013 1034   CALCIUM 10.6* 03/03/2011 1058   PROT 7.1 06/22/2013 1034   PROT 6.5 03/03/2011 1058   ALBUMIN 4.3 06/22/2013 1034   ALBUMIN 4.5 03/03/2011 1058   AST 23 06/22/2013 1034   AST 24 03/03/2011  1058   ALT 21 06/22/2013 1034   ALT 22 03/03/2011 1058   ALKPHOS 95 06/22/2013 1034   ALKPHOS 71 03/03/2011 1058   BILITOT 0.37 06/22/2013 1034   BILITOT 0.3 03/03/2011 1058   GFRNONAA 88.54 08/29/2008 0000   GFRAA  Value: >60        The eGFR has been calculated using the MDRD equation. This calculation has not been validated in all clinical 01/13/2007 0657   Lab Results  Component Value Date   CHOL 217* 02/06/2013   HDL 47 02/06/2013   LDLCALC 137* 02/06/2013   TRIG 166* 02/06/2013    Accessory Clinical Findings  Echocardiogram - 09/12/2013 Left ventricle: The cavity size was normal. Wall thickness was normal. Systolic function was normal. The estimated ejection fraction was 55%. Wall motion was normal; there were no regional wall motion abnormalities. Aortic valve: Trileaflet. Doppler: There was no stenosis. No regurgitation. Aorta: Aortic root normal size. Mitral valve: Minimal prolapse of the posterior leaflet. Doppler: There was no evidence for stenosis. Mild regurgitation. Peak gradient: 68m Hg (D). Left atrium: The atrium was normal in size. Right ventricle: The cavity size was normal. Systolic function was normal. Pulmonic valve: The valve appears to be grossly normal. Tricuspid valve: Doppler: Mild regurgitation. Right atrium: The atrium was normal in size. Pericardium: The pericardium was normal in appearance. Systemic veins: Inferior vena cava: The vessel was normal in size; the respirophasic diameter changes were in the  normal range (= 50%); findings are consistent with normal central venous pressure. Post procedure conclusions Ascending Aorta:  - Aortic root normal size.    Assessment & Plan  73year old female  1. dyspnea on exertion is progressively worsening, we will schedule an exercise nuclear stress test to evaluate for ischemia and blood pressure response to exercise.  2. Hypertension - controlled  3. mitral prolapse - associated with mild mitral regurgitation on the last echocardiogram in 2011, she only has a very mild murmur no need to repeat at this point.  4. Lipids - followed by primary care physician.  Followup after the stress test   I do not think this is angina. I reviewed the findings are for stress Myoview which showed fairly good exercise tolerance with a normal ejection fraction no ischemia 2012. I've advised her not to take sublingual nitroglycerin prior walking. I reviewed the symptoms of angina and instructions to use supplemental nitroglycerin only if it doesn't go away with rest.   Stable by exam and history. Some of the chest burning could be from prolapse. Reassurance given.       KDorothy Spark MD, FSelect Specialty Hospital[facility], Inc6/05/2013, 2:49 PM

## 2013-10-08 ENCOUNTER — Other Ambulatory Visit: Payer: Self-pay

## 2013-10-08 DIAGNOSIS — Z1231 Encounter for screening mammogram for malignant neoplasm of breast: Secondary | ICD-10-CM

## 2013-10-11 ENCOUNTER — Telehealth: Payer: Self-pay | Admitting: *Deleted

## 2013-10-11 ENCOUNTER — Encounter: Payer: Self-pay | Admitting: *Deleted

## 2013-10-11 ENCOUNTER — Ambulatory Visit (HOSPITAL_COMMUNITY): Payer: Medicare Other | Attending: Internal Medicine | Admitting: Radiology

## 2013-10-11 VITALS — BP 136/72 | Ht 63.0 in | Wt 143.0 lb

## 2013-10-11 DIAGNOSIS — R0609 Other forms of dyspnea: Secondary | ICD-10-CM | POA: Insufficient documentation

## 2013-10-11 DIAGNOSIS — I10 Essential (primary) hypertension: Secondary | ICD-10-CM | POA: Insufficient documentation

## 2013-10-11 DIAGNOSIS — Z87891 Personal history of nicotine dependence: Secondary | ICD-10-CM | POA: Insufficient documentation

## 2013-10-11 DIAGNOSIS — R079 Chest pain, unspecified: Secondary | ICD-10-CM | POA: Diagnosis not present

## 2013-10-11 DIAGNOSIS — I4949 Other premature depolarization: Secondary | ICD-10-CM

## 2013-10-11 DIAGNOSIS — R9439 Abnormal result of other cardiovascular function study: Secondary | ICD-10-CM

## 2013-10-11 DIAGNOSIS — I059 Rheumatic mitral valve disease, unspecified: Secondary | ICD-10-CM

## 2013-10-11 DIAGNOSIS — R0989 Other specified symptoms and signs involving the circulatory and respiratory systems: Secondary | ICD-10-CM | POA: Diagnosis not present

## 2013-10-11 DIAGNOSIS — R0602 Shortness of breath: Secondary | ICD-10-CM | POA: Diagnosis not present

## 2013-10-11 DIAGNOSIS — R002 Palpitations: Secondary | ICD-10-CM | POA: Insufficient documentation

## 2013-10-11 DIAGNOSIS — E785 Hyperlipidemia, unspecified: Secondary | ICD-10-CM

## 2013-10-11 MED ORDER — TECHNETIUM TC 99M SESTAMIBI GENERIC - CARDIOLITE
33.0000 | Freq: Once | INTRAVENOUS | Status: AC | PRN
  Administered 2013-10-11: 33 via INTRAVENOUS

## 2013-10-11 MED ORDER — TECHNETIUM TC 99M SESTAMIBI GENERIC - CARDIOLITE
11.0000 | Freq: Once | INTRAVENOUS | Status: AC | PRN
Start: 2013-10-11 — End: 2013-10-11
  Administered 2013-10-11: 11 via INTRAVENOUS

## 2013-10-11 NOTE — Telephone Encounter (Signed)
Message copied by Nuala Alpha on Thu Oct 11, 2013  5:16 PM ------      Message from: [name] [name]      Created: Thu Oct 11, 2013  3:52 PM       This patient had abnormal stress test today, she needs a cath, could you schedule her?      Thank you!      KN ------

## 2013-10-11 NOTE — Progress Notes (Signed)
[name] [address] [place], [place] [address] [phone]    Cardiology Nuclear Med Study  [name] [name] is a 73 y.o. female     MRN : [id number]     DOB: [birth year]  Procedure Date: 10/11/2013  Nuclear Med Background Indication for Stress Test:  Evaluation for Ischemia History:  No H/O CAD, Tachy/Brady Syndrome 10/12/10 MPI: EF: 69% 5/15 EF 55%  Cardiac Risk Factors: History of Smoking, Hypertension and Lipids  Symptoms:  Chest Pain, DOE, Fatigue, Palpitations and SOB   Nuclear Pre-Procedure Caffeine/Decaff Intake:  None NPO After: 7:00pm   Lungs:  clear O2 Sat: 96% on room air. IV 0.9% NS with Angio Cath:  22g  IV Site: R Hand  IV Started by:  Crissie Figures, RN  Chest Size (in):  36 Cup Size: D  Height: 5\' 3"  (1.6 m)  Weight:  143 lb (64.864 kg)  BMI:  Body mass index is 25.34 kg/(m^2). Tech Comments:  Atenolol held x 24 hrs    Nuclear Med Study 1 or 2 day study: 1 day  Stress Test Type:  Stress  Reading MD: N/A  Order Authorizing Provider:  Ena Dawley, MD  Resting Radionuclide: Technetium 68m Sestamibi  Resting Radionuclide Dose: 11.0 mCi   Stress Radionuclide:  Technetium 76m Sestamibi  Stress Radionuclide Dose: 33.0 mCi           Stress Protocol Rest HR: 65 Stress HR: 141  Rest BP: 136/72 Stress BP: 195/86  Exercise Time (min): 5:00 METS: 7.0   Predicted Max HR: 148 bpm % Max HR: 95.27 bpm Rate Pressure Product: 93267   Dose of Adenosine (mg):  n/a Dose of Lexiscan: n/a mg  Dose of Atropine (mg): n/a Dose of Dobutamine: n/a mcg/kg/min (at max HR)  Stress Test Technologist: Perrin Maltese, EMT-P  Nuclear Technologist:  Annye Rusk, CNMT     Rest Procedure:  Myocardial perfusion imaging was performed at rest 45 minutes following the intravenous administration of Technetium 15m Sestamibi. Rest ECG: NSR with nonspecific ST abnormality  Stress Procedure:  The patient exercised on the treadmill utilizing the Bruce  Protocol for 5:00 minutes. The patient stopped due to fatigue, sob, and denied any chest burning.  Technetium 76m Sestamibi was injected at peak exercise and myocardial perfusion imaging was performed after a brief delay. Stress ECG: 3mm of horizontal ST segment depression that becomes downsloping at peak exercise.  ST abnormality continued 7 minutes into recovery  QPS Raw Data Images:  Normal; no motion artifact; normal heart/lung ratio. Stress Images:  Normal homogeneous uptake in all areas of the myocardium. Rest Images:  Normal homogeneous uptake in all areas of the myocardium. Subtraction (SDS):  No evidence of ischemia. Transient Ischemic Dilatation (Normal <1.22):  1.20 Lung/Heart Ratio (Normal <0.45):  0.27  Quantitative Gated Spect Images QGS EDV:  63 ml QGS ESV:  23 ml  Impression Exercise Capacity:  Fair exercise capacity. BP Response:  Hypotensive blood pressure response. Clinical Symptoms:  Typical chest pain. ECG Impression:  Significant ST abnormalities consistent with ischemia. Comparison with Prior Nuclear Study: No images to compare  Overall Impression:  High risk stress nuclear study normal myocardial perfusion but patient had significant EKG changes of ischemia with hypotensive BP response to exercise and transient ischemic dilatation of the LV..  LV Ejection Fraction: 64%.  LV Wall Motion:  NL LV Function; NL Wall Motion  Signed: Fransico Him, MD [facility]

## 2013-10-11 NOTE — Telephone Encounter (Signed)
Pt has been scheduled for left sided cardiac catheterization for abnormal stress test per Dr Meda Coffee for next Tuesday 10-16-13 with Dr Martinique at 9 am.  Per Dr Meda Coffee this pt is on no med restrictions and does not need a chest x-ray. Pt does need pre-procedure labs done.  Pt will come in tomorrow for pre-procedure labs (pt,ptt,bmp,cbc w diff) in our office.  Made pt a confirmed lab appt.  Pt aware that when she comes in for lab work tomorrow, that letter of instruction will be up front for her to pick up.  Dr Meda Coffee aware to put hospital orders in.  Pts allergies were reviewed. No new allergies since last marked review.  Spoke with Crystal at cath lab to have this scheduled.  Pt verbalized understanding of all instructions given and agrees with this plan.

## 2013-10-12 ENCOUNTER — Encounter (HOSPITAL_COMMUNITY): Payer: Self-pay | Admitting: Pharmacy Technician

## 2013-10-12 ENCOUNTER — Other Ambulatory Visit: Payer: Medicare Other

## 2013-10-12 ENCOUNTER — Other Ambulatory Visit (INDEPENDENT_AMBULATORY_CARE_PROVIDER_SITE_OTHER): Payer: Medicare Other

## 2013-10-12 DIAGNOSIS — I059 Rheumatic mitral valve disease, unspecified: Secondary | ICD-10-CM

## 2013-10-12 DIAGNOSIS — R0989 Other specified symptoms and signs involving the circulatory and respiratory systems: Secondary | ICD-10-CM

## 2013-10-12 DIAGNOSIS — R9439 Abnormal result of other cardiovascular function study: Secondary | ICD-10-CM

## 2013-10-12 DIAGNOSIS — E785 Hyperlipidemia, unspecified: Secondary | ICD-10-CM

## 2013-10-12 DIAGNOSIS — R079 Chest pain, unspecified: Secondary | ICD-10-CM | POA: Diagnosis not present

## 2013-10-12 DIAGNOSIS — R0609 Other forms of dyspnea: Secondary | ICD-10-CM

## 2013-10-12 LAB — BASIC METABOLIC PANEL
BUN: 18 mg/dL (ref 6–23)
CO2: 30 mEq/L (ref 19–32)
Calcium: 10.9 mg/dL — ABNORMAL HIGH (ref 8.4–10.5)
Chloride: 106 mEq/L (ref 96–112)
Creatinine, Ser: 0.7 mg/dL (ref 0.4–1.2)
GFR: 90.21 mL/min (ref >60.00)
Glucose, Bld: 90 mg/dL (ref 70–99)
Potassium: 5.6 mEq/L — ABNORMAL HIGH (ref 3.5–5.1)
Sodium: 143 mEq/L (ref 135–145)

## 2013-10-12 LAB — CBC WITH DIFFERENTIAL/PLATELET
Basophils Absolute: 0 10*3/uL (ref 0.0–0.1)
Basophils Relative: 0.5 % (ref 0.0–3.0)
Eosinophils Absolute: 0.1 10*3/uL (ref 0.0–0.7)
Eosinophils Relative: 1.8 % (ref 0.0–5.0)
HCT: 40.7 % (ref 36.0–46.0)
Hemoglobin: 13.7 g/dL (ref 12.0–15.0)
Lymphocytes Relative: 26.9 % (ref 12.0–46.0)
Lymphs Abs: 1.8 10*3/uL (ref 0.7–4.0)
MCHC: 33.5 g/dL (ref 30.0–36.0)
MCV: 91.1 fl (ref 78.0–100.0)
Monocytes Absolute: 0.8 10*3/uL (ref 0.1–1.0)
Monocytes Relative: 12.2 % — ABNORMAL HIGH (ref 3.0–12.0)
Neutro Abs: 4 10*3/uL (ref 1.4–7.7)
Neutrophils Relative %: 58.6 % (ref 43.0–77.0)
Platelets: 257 10*3/uL (ref 150.0–400.0)
RBC: 4.47 Mil/uL (ref 3.87–5.11)
RDW: 13.3 % (ref 11.5–15.5)
WBC: 6.9 10*3/uL (ref 4.0–10.5)

## 2013-10-12 LAB — PROTIME-INR
INR: 1 ratio (ref 0.8–1.0)
Prothrombin Time: 10.9 s (ref 9.6–13.1)

## 2013-10-12 LAB — APTT: aPTT: 31.2 s — ABNORMAL HIGH (ref 21.7–28.8)

## 2013-10-16 ENCOUNTER — Encounter (HOSPITAL_COMMUNITY)
Admission: RE | Disposition: A | Discharge status: Home / Self Care | Payer: Self-pay | Source: Ambulatory Visit | Attending: Cardiology

## 2013-10-16 ENCOUNTER — Ambulatory Visit (HOSPITAL_COMMUNITY)
Admission: RE | Admit: 2013-10-16 | Discharge: 2013-10-16 | Disposition: A | Discharge status: Home / Self Care | Payer: Medicare Other | Source: Ambulatory Visit | Attending: Cardiology | Admitting: Cardiology

## 2013-10-16 DIAGNOSIS — R0609 Other forms of dyspnea: Secondary | ICD-10-CM | POA: Diagnosis not present

## 2013-10-16 DIAGNOSIS — I495 Sick sinus syndrome: Secondary | ICD-10-CM | POA: Insufficient documentation

## 2013-10-16 DIAGNOSIS — R0989 Other specified symptoms and signs involving the circulatory and respiratory systems: Secondary | ICD-10-CM | POA: Insufficient documentation

## 2013-10-16 DIAGNOSIS — Z7982 Long term (current) use of aspirin: Secondary | ICD-10-CM | POA: Insufficient documentation

## 2013-10-16 DIAGNOSIS — R9439 Abnormal result of other cardiovascular function study: Secondary | ICD-10-CM | POA: Insufficient documentation

## 2013-10-16 DIAGNOSIS — I059 Rheumatic mitral valve disease, unspecified: Secondary | ICD-10-CM | POA: Diagnosis not present

## 2013-10-16 DIAGNOSIS — K7689 Other specified diseases of liver: Secondary | ICD-10-CM | POA: Diagnosis not present

## 2013-10-16 DIAGNOSIS — R079 Chest pain, unspecified: Secondary | ICD-10-CM | POA: Insufficient documentation

## 2013-10-16 DIAGNOSIS — K559 Vascular disorder of intestine, unspecified: Secondary | ICD-10-CM | POA: Insufficient documentation

## 2013-10-16 DIAGNOSIS — E785 Hyperlipidemia, unspecified: Secondary | ICD-10-CM | POA: Insufficient documentation

## 2013-10-16 DIAGNOSIS — Z87891 Personal history of nicotine dependence: Secondary | ICD-10-CM | POA: Diagnosis not present

## 2013-10-16 DIAGNOSIS — I10 Essential (primary) hypertension: Secondary | ICD-10-CM | POA: Diagnosis not present

## 2013-10-16 HISTORY — PX: LEFT HEART CATHETERIZATION WITH CORONARY ANGIOGRAM: SHX5451

## 2013-10-16 HISTORY — DX: Chest pain, unspecified: R07.9

## 2013-10-16 LAB — POTASSIUM: Potassium: 3.7 mEq/L (ref 3.7–5.3)

## 2013-10-16 SURGERY — LEFT HEART CATHETERIZATION WITH CORONARY ANGIOGRAM
Anesthesia: LOCAL

## 2013-10-16 MED ORDER — SODIUM CHLORIDE 0.9 % IJ SOLN: 3.0000 mL | Freq: Two times a day (BID) | INTRAMUSCULAR | Status: DC

## 2013-10-16 MED ORDER — ASPIRIN 81 MG PO CHEW: 81.0000 mg | CHEWABLE_TABLET | ORAL | Status: DC

## 2013-10-16 MED ORDER — DIPHENHYDRAMINE HCL 50 MG/ML IJ SOLN
25.0000 mg | INTRAMUSCULAR | Status: AC
  Administered 2013-10-16: 25 mg via INTRAVENOUS
  Filled 2013-10-16: qty 1

## 2013-10-16 MED ORDER — SODIUM CHLORIDE 0.9 % IV SOLN
INTRAVENOUS | Status: DC
  Administered 2013-10-16: 08:00:00 via INTRAVENOUS

## 2013-10-16 MED ORDER — SODIUM CHLORIDE 0.9 % IJ SOLN: 3.0000 mL | INTRAMUSCULAR | Status: DC | PRN

## 2013-10-16 MED ORDER — LIDOCAINE HCL (PF) 1 % IJ SOLN
INTRAMUSCULAR | Status: AC
  Filled 2013-10-16: qty 30

## 2013-10-16 MED ORDER — NITROGLYCERIN 0.2 MG/ML ON CALL CATH LAB
INTRAVENOUS | Status: AC
  Filled 2013-10-16: qty 1

## 2013-10-16 MED ORDER — FAMOTIDINE IN NACL 20-0.9 MG/50ML-% IV SOLN
20.0000 mg | INTRAVENOUS | Status: AC
  Administered 2013-10-16: 20 mg via INTRAVENOUS
  Filled 2013-10-16: qty 50

## 2013-10-16 MED ORDER — VERAPAMIL HCL 2.5 MG/ML IV SOLN
INTRAVENOUS | Status: AC
  Filled 2013-10-16: qty 2

## 2013-10-16 MED ORDER — SODIUM CHLORIDE 0.9 % IV SOLN: 1.0000 mL/kg/h | INTRAVENOUS | Status: DC

## 2013-10-16 MED ORDER — SODIUM CHLORIDE 0.9 % IV SOLN: 250.0000 mL | INTRAVENOUS | Status: DC | PRN

## 2013-10-16 MED ORDER — HEPARIN SODIUM (PORCINE) 1000 UNIT/ML IJ SOLN
INTRAMUSCULAR | Status: AC
  Filled 2013-10-16: qty 1

## 2013-10-16 MED ORDER — MIDAZOLAM HCL 2 MG/2ML IJ SOLN
INTRAMUSCULAR | Status: AC
  Filled 2013-10-16: qty 2

## 2013-10-16 MED ORDER — FENTANYL CITRATE 0.05 MG/ML IJ SOLN
INTRAMUSCULAR | Status: AC
  Filled 2013-10-16: qty 2

## 2013-10-16 MED ORDER — METHYLPREDNISOLONE SODIUM SUCC 125 MG IJ SOLR
125.0000 mg | INTRAMUSCULAR | Status: AC
  Administered 2013-10-16: 125 mg via INTRAVENOUS
  Filled 2013-10-16: qty 2

## 2013-10-16 MED ORDER — HEPARIN (PORCINE) IN NACL 2-0.9 UNIT/ML-% IJ SOLN
INTRAMUSCULAR | Status: AC
  Filled 2013-10-16: qty 1000

## 2013-10-16 NOTE — Progress Notes (Signed)
Per Dr. Martinique, pt needs f/u appt. Scheduling lines at our office were busy on several tries. I have left a message on our office's scheduling voicemail requesting a follow-up appointment, and our office will call the patient with this appointment. Dayna Dunn PA-C

## 2013-10-16 NOTE — Discharge Instructions (Signed)

## 2013-10-16 NOTE — CV Procedure (Signed)
    Cardiac Catheterization Procedure Note  Name: [name] [name] MRN: [id number] DOB: Sep 23, 1940  Procedure: Left Heart Cath, Selective Coronary Angiography, LV angiography  Indication: 73 yo WF with chest pain and abnormal stress test.   Procedural Details: The right wrist was prepped, draped, and anesthetized with 1% lidocaine. Using the modified Seldinger technique, a 6 French slender sheath was introduced into the right radial artery. 3 mg of verapamil was administered through the sheath, weight-based unfractionated heparin was administered intravenously. Standard Judkins catheters were used for selective coronary angiography and left ventriculography. Catheter exchanges were performed over an exchange length guidewire. There were no immediate procedural complications. A TR band was used for radial hemostasis at the completion of the procedure.  The patient was transferred to the post catheterization recovery area for further monitoring.  Procedural Findings: Hemodynamics: AO 140/61 mean 97 mm Hg LV 140/8 mm Hg  Coronary angiography: Coronary dominance: codominant  Left mainstem: Normal  Left anterior descending (LAD): Normal  Left circumflex (LCx): Normal  Right coronary artery (RCA): Normal  Left ventriculography: Left ventricular systolic function is normal, LVEF is estimated at 55-65%, there is no significant mitral regurgitation   Final Conclusions:   1. Normal coronary anatomy 2. Normal LV function  Recommendations: medical therapy.  Peter Martinique, [place]  10/16/2013, 12:01 PM

## 2013-10-16 NOTE — Interval H&P Note (Signed)
History and Physical Interval Note:  10/16/2013 11:28 AM  [name] [name]  has presented today for surgery, with the diagnosis of abnormal stress test   The various methods of treatment have been discussed with the patient and family. After consideration of risks, benefits and other options for treatment, the patient has consented to  Procedure(s): LEFT HEART CATHETERIZATION WITH CORONARY ANGIOGRAM (N/A) as a surgical intervention .  The patient's history has been reviewed, patient examined, no change in status, stable for surgery.  I have reviewed the patient's chart and labs.  Questions were answered to the patient's satisfaction.    Cath Lab Visit (complete for each Cath Lab visit)  Clinical Evaluation Leading to the Procedure:   ACS: no  Non-ACS:    Anginal Classification: CCS III  Anti-ischemic medical therapy: Minimal Therapy (1 class of medications)  Non-Invasive Test Results: High-risk stress test findings: cardiac mortality >3%/year  Prior CABG: No previous CABG       [name] [name] [facility] 10/16/2013 11:28 AM

## 2013-10-16 NOTE — H&P (View-Only) (Signed)
Patient ID: SHINA WASS, female   DOB: [birth year], 73 y.o.   MRN: [id number]     Patient Name: [name] [name] Date of Encounter: 10/01/2013  Primary Care Provider:  [name] Primary Cardiologist:  Dorothy Spark (previously Dr Verl Blalock)  Problem List   Past Medical History  Diagnosis Date  . MVP (mitral valve prolapse)   . Brady-tachy syndrome   . HLD (hyperlipidemia)   . Palpitation   . Dyspepsia   . Vasovagal syncope   . Colitis, ischemic   . Fatty liver   . Osteopenia   . Diverticulosis of colon   . IBS (irritable bowel syndrome)   . Incontinence, feces   . Rectocele   . Hypertension   . Diverticulitis   . Rectocele   . nhl dx'd 07/1999    chemo comp 2001; rituxin comp 2005  . Colon polyp 07/29/1993    with focal adenomatous changes   Past Surgical History  Procedure Laterality Date  . Pelvic prolapse repair    . Abdominal hysterectomy    . Bladder suspension    . Bunionectomy    . Cholecystectomy  2003  . Upper gastrointestinal endoscopy  10/2005    hiatal hernia  . Colonoscopy  7/200/, 11/2007    diverticulosis  . Abdominal surgery      lymph node removal    Allergies  Allergies  Allergen Reactions  . Codeine Nausea Only  . Iodine Hives  . Iohexol Hives     Desc: 50 mg benadryl prior to exam   . Pamine Forte [Methscopolamine] Other (See Comments)    Unable to urinate  . Robinul [Glycopyrrolate] Other (See Comments)    Unable to urinate  . Sulfa Antibiotics Swelling  . Sulfonamide Derivatives Swelling    HPI  Mrs Luebbe returns today for evaluation and management of some chest burning with exercise or walking. She was given sublingual nitroglycerin to take prior walking by Dr. Carlean Purl. She denies any chest pressure heaviness or typical angina. The burning does go away with stopping. She does have a history of prolapse and atypical chest pain. We performed a stress nuclear study in June 2012 which was negative for ischemia with  normal ejection fraction. She has not taken a nitroglycerin for fear it would give her a headache. She denies palpitations presyncope or syncope. Other than her age, she has no cardiac risk factors for coronary disease. She does have a history of reflux.  The patient has been experiencing worsening of dyspnea on exertion. Last year. She states that she has been always a walker but now her dyspnea is so bad that for the last month she hasn't been walking at all. She denies any chest pain. But her dyspnea would be her limiting factor when walking. She denies any palpitations, orthopnea, personal nocturnal dyspnea or lower extremity edema.  Home Medications  Prior to Admission medications   Medication Sig Start Date End Date Taking? Authorizing Provider  aspirin EC 81 MG tablet Take 81 mg by mouth daily.   Yes Historical Provider, MD  atenolol (TENORMIN) 25 MG tablet TAKE ONE (1) TABLET EACH DAY   Yes Dorothy Spark, MD  calcium carbonate (OS-CAL) 600 MG TABS Take 600 mg by mouth 2 (two) times daily with a meal.     Yes Historical Provider, MD  esomeprazole (NEXIUM) 20 MG capsule Take 20 mg by mouth daily before breakfast.   Yes Historical Provider, MD  fish oil-omega-3 fatty acids 1000 MG capsule  Take 2 g by mouth daily.     Yes Historical Provider, MD  Multiple Vitamin (MULTIVITAMIN) tablet Take 1 tablet by mouth daily.     Yes Historical Provider, MD  Probiotic Product ([place]) CAPS Take 1 capsule by mouth daily. 07/24/12  Yes Gatha Mayer, MD    Family History  Family History  Problem Relation Age of Onset  . Colon cancer Father 45  . Prostate cancer Brother   . Colon polyps Brother   . Colon polyps Brother     Social History  History   Social History  . Marital Status: Married    Spouse Name: N/A    Number of Children: 1  . Years of Education: N/A   Occupational History  . Retired   .     Social History Main Topics  . Smoking status: Former Smoker --  0.33 packs/day    Quit date: 05/04/1967  . Smokeless tobacco: Never Used     Comment: quit 1976  . Alcohol Use: No  . Drug Use: No  . Sexual Activity: Yes    Birth Control/ Protection: Surgical   Other Topics Concern  . Not on file   Social History Narrative  . No narrative on file     Review of Systems, as per HPI, otherwise negative General:  No chills, fever, night sweats or weight changes.  Cardiovascular:  No chest pain, dyspnea on exertion, edema, orthopnea, palpitations, paroxysmal nocturnal dyspnea. Dermatological: No rash, lesions/masses Respiratory: No cough, dyspnea Urologic: No hematuria, dysuria Abdominal:   No nausea, vomiting, diarrhea, bright red blood per rectum, melena, or hematemesis Neurologic:  No visual changes, wkns, changes in mental status. All other systems reviewed and are otherwise negative except as noted above.  Physical Exam  Blood pressure 122/64, pulse 80, height _0  (1.6 m), weight 142 lb (64.411 kg).  General: Pleasant, NAD Psych: Normal affect. Neuro: Alert and oriented X 3. Moves all extremities spontaneously. HEENT: Normal  Neck: Supple without bruits or JVD. Lungs:  Resp regular and unlabored, CTA. Heart: RRR no s3, s4, midsystolic click followed by a soft murmur.. Abdomen: Soft, non-tender, non-distended, BS + x 4.  Extremities: No clubbing, cyanosis or edema. DP/PT/Radials 2+ and equal bilaterally.  Labs:  No results found for this basename: CKTOTAL, CKMB, TROPONINI,  in the last 72 hours Lab Results  Component Value Date   WBC 7.4 06/22/2013   HGB 14.4 06/22/2013   HCT 43.0 06/22/2013   MCV 91.0 06/22/2013   PLT 256 06/22/2013    No results found for this basename: DDIMER   No components found with this basename: POCBNP,     Component Value Date/Time   NA 143 06/22/2013 1034   NA 141 03/03/2011 1058   K 4.7 06/22/2013 1034   K 4.5 03/03/2011 1058   CL 105 01/20/2012 1021   CL 104 03/03/2011 1058   CO2 28 06/22/2013 1034     CO2 28 03/03/2011 1058   GLUCOSE 94 06/22/2013 1034   GLUCOSE 116* 02/06/2013 0806   GLUCOSE 91 01/20/2012 1021   BUN 19.6 06/22/2013 1034   BUN 16 03/03/2011 1058   CREATININE 0.8 06/22/2013 1034   CREATININE 0.82 03/03/2011 1058   CALCIUM 10.9* 06/22/2013 1034   CALCIUM 10.6* 03/03/2011 1058   PROT 7.1 06/22/2013 1034   PROT 6.5 03/03/2011 1058   ALBUMIN 4.3 06/22/2013 1034   ALBUMIN 4.5 03/03/2011 1058   AST 23 06/22/2013 1034   AST 24 03/03/2011  1058   ALT 21 06/22/2013 1034   ALT 22 03/03/2011 1058   ALKPHOS 95 06/22/2013 1034   ALKPHOS 71 03/03/2011 1058   BILITOT 0.37 06/22/2013 1034   BILITOT 0.3 03/03/2011 1058   GFRNONAA 88.54 08/29/2008 0000   GFRAA  Value: >60        The eGFR has been calculated using the MDRD equation. This calculation has not been validated in all clinical 01/13/2007 0657   Lab Results  Component Value Date   CHOL 217* 02/06/2013   HDL 47 02/06/2013   LDLCALC 137* 02/06/2013   TRIG 166* 02/06/2013    Accessory Clinical Findings  Echocardiogram - 09/12/2013 Left ventricle: The cavity size was normal. Wall thickness was normal. Systolic function was normal. The estimated ejection fraction was 55%. Wall motion was normal; there were no regional wall motion abnormalities. Aortic valve: Trileaflet. Doppler: There was no stenosis. No regurgitation. Aorta: Aortic root normal size. Mitral valve: Minimal prolapse of the posterior leaflet. Doppler: There was no evidence for stenosis. Mild regurgitation. Peak gradient: 68m Hg (D). Left atrium: The atrium was normal in size. Right ventricle: The cavity size was normal. Systolic function was normal. Pulmonic valve: The valve appears to be grossly normal. Tricuspid valve: Doppler: Mild regurgitation. Right atrium: The atrium was normal in size. Pericardium: The pericardium was normal in appearance. Systemic veins: Inferior vena cava: The vessel was normal in size; the respirophasic diameter changes were in the  normal range (= 50%); findings are consistent with normal central venous pressure. Post procedure conclusions Ascending Aorta:  - Aortic root normal size.    Assessment & Plan  73year old female  1. dyspnea on exertion is progressively worsening, we will schedule an exercise nuclear stress test to evaluate for ischemia and blood pressure response to exercise.  2. Hypertension - controlled  3. mitral prolapse - associated with mild mitral regurgitation on the last echocardiogram in 2011, she only has a very mild murmur no need to repeat at this point.  4. Lipids - followed by primary care physician.  Followup after the stress test   I do not think this is angina. I reviewed the findings are for stress Myoview which showed fairly good exercise tolerance with a normal ejection fraction no ischemia 2012. I've advised her not to take sublingual nitroglycerin prior walking. I reviewed the symptoms of angina and instructions to use supplemental nitroglycerin only if it doesn't go away with rest.   Stable by exam and history. Some of the chest burning could be from prolapse. Reassurance given.       KDorothy Spark MD, FSelect Specialty Hospital[facility], Inc6/05/2013, 2:49 PM

## 2013-10-17 ENCOUNTER — Other Ambulatory Visit: Payer: Self-pay | Admitting: Cardiology

## 2013-10-19 ENCOUNTER — Telehealth: Payer: Self-pay | Admitting: Cardiology

## 2013-10-19 NOTE — Telephone Encounter (Signed)
Pt calling to see if Dr Meda Coffee can see her instead of an extender.  Informed pt that Dr Meda Coffee will be out of the office for 2 weeks and that one of our extenders will need to see her post cath.  Reassured pt that all the extenders in our office are excellent and coincide with the MD everyday.  Pt feels reassured and pleased with the assistance.  Sent schedulers a note to have this f/u appt set up with one of the extenders in our office, post cath.  Pt verbalized understanding and agrees with this plan.

## 2013-10-19 NOTE — Telephone Encounter (Signed)
New problem   Pt is having concerning about coming in for an eph appt and seeing a PA/NP, pt want to see Dr Meda Coffee. Please call pt concerning this matter.

## 2013-10-30 ENCOUNTER — Ambulatory Visit (INDEPENDENT_AMBULATORY_CARE_PROVIDER_SITE_OTHER): Payer: Medicare Other | Admitting: Cardiology

## 2013-10-30 ENCOUNTER — Telehealth: Payer: Self-pay | Admitting: Cardiology

## 2013-10-30 ENCOUNTER — Encounter: Payer: Self-pay | Admitting: Cardiology

## 2013-10-30 VITALS — BP 144/72 | HR 72 | Ht 63.0 in | Wt 144.0 lb

## 2013-10-30 DIAGNOSIS — R000 Tachycardia, unspecified: Secondary | ICD-10-CM

## 2013-10-30 NOTE — Assessment & Plan Note (Signed)
The nuclear portion of the stress test was negative but the EKG had significant changes.  She underwent cardiac catheterization with normal coronary arteries and normal LV function. She continues with discomfort with walking she also has fluttering feeling in her heart now after the cath. See above note.  Dr. Martinique also felt after cath, that perhaps pain was reflux with a exertion, she will resume her Nexium in the morning and continue her Zegerid in the afternoons evenings until she sees Dr. Carlean Purl back.

## 2013-10-30 NOTE — Progress Notes (Signed)
10/30/2013   PCP: [name]   Chief Complaint  Patient presents with  . Follow-up    S/P cath    Primary Cardiologist: Dr. Liane Comber  HPI:  Patient was seen by Dr. Meda Coffee for chest burning with exercise or walking. The [place] one point had given her nitroglycerin to take but she was told by Dr. Verl Blalock not to take it. Her chest burning goes away with stopping does have a history of mitral prolapse. Stress test in June 2012 was negative for ischemia and normal EF. She was also having more dyspnea on exertion she went underwent an exercise Myoview which revealed no ischemia on the nuclear portion but EKG had changes. Due to the changes and ongoing chest burning she underwent cardiac catheterization with Dr. Martinique.  She was found to have normal coronary anatomy and normal LV function. Dr. Martinique told the patient she does have a hiatal hernia perhaps she is having reflux at times with her exercise.  Patient does admit to had been on Nexium and Zegerid but now is only on the Zegerid.  She also complains of palpitations since she had a cardiac catheterization.  She is still somewhat anxious about the burning with exertion.   Allergies  Allergen Reactions  . Ace Inhibitors   . Codeine Nausea Only  . Iodine Hives  . Iohexol Hives     Desc: 50 mg benadryl prior to exam   . Pamine Forte [Methscopolamine] Other (See Comments)    Unable to urinate  . Robinul [Glycopyrrolate] Other (See Comments)    Unable to urinate  . Sulfa Antibiotics Swelling  . Sulfonamide Derivatives Swelling    Current Outpatient Prescriptions  Medication Sig Dispense Refill  . aspirin EC 81 MG tablet Take 81 mg by mouth daily.      Marland Kitchen atenolol (TENORMIN) 25 MG tablet Take 25 mg by mouth daily.      . calcium carbonate (OS-CAL) 600 MG TABS Take 600 mg by mouth 2 (two) times daily with a meal.        . fexofenadine (ALLEGRA) 180 MG tablet Take 180 mg by mouth daily.      . fish oil-omega-3  fatty acids 1000 MG capsule Take 1 g by mouth daily.       . Multiple Vitamin (MULTIVITAMIN) tablet Take 1 tablet by mouth daily.        Earney Navy Bicarbonate (ZEGERID OTC PO) Take 1 capsule by mouth daily.      . Polyvinyl Alcohol-Povidone (REFRESH OP) Apply 1 drop to eye 3 (three) times daily.      . Probiotic Product ([place]) CAPS Take 1 capsule by mouth daily.       No current facility-administered medications for this visit.    Past Medical History  Diagnosis Date  . MVP (mitral valve prolapse)   . Brady-tachy syndrome   . HLD (hyperlipidemia)   . Palpitation   . Dyspepsia   . Vasovagal syncope   . Colitis, ischemic   . Fatty liver   . Osteopenia   . Diverticulosis of colon   . IBS (irritable bowel syndrome)   . Incontinence, feces   . Rectocele   . Hypertension   . Diverticulitis   . Rectocele   . nhl dx'd 07/1999    chemo comp 2001; rituxin comp 2005  . Colon polyp 07/29/1993    with focal adenomatous changes  . Chest pain 10/16/13  normal coronary arteries on cath and normal EF    Past Surgical History  Procedure Laterality Date  . Pelvic prolapse repair    . Abdominal hysterectomy    . Bladder suspension    . Bunionectomy    . Cholecystectomy  2003  . Upper gastrointestinal endoscopy  10/2005    hiatal hernia  . Colonoscopy  7/200/, 11/2007    diverticulosis  . Abdominal surgery    . Cardiac catheterization  10/16/13    done for abnormal nuc, no CAD, normal EF    GYF:VCBSWHQ:PR colds or fevers, no weight changes Skin:no rashes or ulcers HEENT:no blurred vision, no congestion CV:see HPI PUL:see HPI GI:no diarrhea constipation or melena, no indigestion  GU:no hematuria, no dysuria MS:no joint pain, no claudication Neuro:no syncope, no lightheadedness Endo:no diabetes, no thyroid disease  Wt Readings from Last 3 Encounters:  10/30/13 144 lb (65.318 kg)  10/16/13 142 lb (64.411 kg)  10/16/13 142 lb (64.411 kg)    PHYSICAL  EXAM BP 144/72  Pulse 72  Ht 5\' 3"  (1.6 m)  Wt 144 lb (65.318 kg)  BMI 25.51 kg/m2 General:Pleasant affect, NAD Skin:Warm and dry, brisk capillary refill HEENT:normocephalic, sclera clear, mucus membranes moist Neck:supple, no JVD, no bruits  Heart:S1S2 RRR without murmur, gallup, rub or click Lungs:clear without rales, rhonchi, or wheezes FFM:BWGY, non tender, + BS, do not palpate liver spleen or masses Ext:no lower ext edema, 2+ pedal pulses, 2+ radial pulses-cath site without hematoma Neuro:alert and oriented, MAE, follows commands, + facial symmetry    ASSESSMENT AND PLAN Tachycardia Since cast she feels her heart fluttering at times. We'll add event monitor for 2 weeks this will give this an idea of HR when she's walking her usual walking route when she gets the chest discomfort and to evaluate for tachycardia.  Chest pain on exertion The nuclear portion of the stress test was negative but the EKG had significant changes.  She underwent cardiac catheterization with normal coronary arteries and normal LV function. She continues with discomfort with walking she also has fluttering feeling in her heart now after the cath. See above note.  Dr. Martinique also felt after cath, that perhaps pain was reflux with a exertion, she will resume her Nexium in the morning and continue her Zegerid in the afternoons evenings until she sees Dr. Carlean Purl back.

## 2013-10-30 NOTE — Patient Instructions (Signed)
Your physician has recommended that you wear an event monitor for two weeks. Event monitors are medical devices that record the heart's electrical activity. Doctors most often [place] these monitors to diagnose arrhythmias. Arrhythmias are problems with the speed or rhythm of the heartbeat. The monitor is a small, portable device. You can wear one while you do your normal daily activities. This is usually used to diagnose what is causing palpitations/syncope (passing out).  Take your Nexium in the morning and Zegerid in the evening  Your physician recommends that you schedule a follow-up appointment in: 3-4 weeks with Dr.Nelson

## 2013-10-30 NOTE — Telephone Encounter (Signed)
Brittney will re-enter the [place] registration and should be activated within the hour.  Patient told to call back if it does not notify her. Patient voiced understanding.

## 2013-10-30 NOTE — Telephone Encounter (Signed)
Patient states that she was here today and received a monitor---it has not been activated yet.  Please confirm when this is done.

## 2013-10-30 NOTE — Assessment & Plan Note (Signed)
Since cast she feels her heart fluttering at times. We'll add event monitor for 2 weeks this will give this an idea of HR when she's walking her usual walking route when she gets the chest discomfort and to evaluate for tachycardia.

## 2013-11-05 ENCOUNTER — Telehealth: Payer: Self-pay | Admitting: Internal Medicine

## 2013-11-05 NOTE — Telephone Encounter (Signed)
Spoke with patient and she has been eating lots of tomatoes. On Friday, she started having multiple BM's with blood mucous and abdominal pain like she does with diverticulitis. She has Cefdinir on hand and took it until yesterday when she ran out. She has not had any blood in stool or pain since yesterday. She is eating lightly and drinking liquids. She is asking for rx for more antibiotics. Please, advise.

## 2013-11-05 NOTE — Telephone Encounter (Signed)
Patient given Dr. Celesta Aver recommendations. Offered OV with extender tomorrow. She cannot come until Wednesday. Scheduled with Tye Savoy, NP on 11/07/13 at 8:30 AM.

## 2013-11-05 NOTE — Telephone Encounter (Signed)
If she is better would observe for more problems at this point Could she see an APP this week?  That would be best care - I do not have any room

## 2013-11-07 ENCOUNTER — Ambulatory Visit (INDEPENDENT_AMBULATORY_CARE_PROVIDER_SITE_OTHER): Payer: Medicare Other | Admitting: Nurse Practitioner

## 2013-11-07 ENCOUNTER — Encounter: Payer: Self-pay | Admitting: Nurse Practitioner

## 2013-11-07 VITALS — BP 124/70 | HR 64 | Ht 63.0 in | Wt 141.2 lb

## 2013-11-07 DIAGNOSIS — D239 Other benign neoplasm of skin, unspecified: Secondary | ICD-10-CM | POA: Diagnosis not present

## 2013-11-07 DIAGNOSIS — R109 Unspecified abdominal pain: Secondary | ICD-10-CM | POA: Insufficient documentation

## 2013-11-07 DIAGNOSIS — L821 Other seborrheic keratosis: Secondary | ICD-10-CM | POA: Diagnosis not present

## 2013-11-07 DIAGNOSIS — L723 Sebaceous cyst: Secondary | ICD-10-CM | POA: Diagnosis not present

## 2013-11-07 DIAGNOSIS — I781 Nevus, non-neoplastic: Secondary | ICD-10-CM | POA: Diagnosis not present

## 2013-11-07 DIAGNOSIS — K625 Hemorrhage of anus and rectum: Secondary | ICD-10-CM

## 2013-11-07 DIAGNOSIS — D1739 Benign lipomatous neoplasm of skin and subcutaneous tissue of other sites: Secondary | ICD-10-CM | POA: Diagnosis not present

## 2013-11-07 NOTE — Progress Notes (Signed)
     History of Present Illness:  Patient is a 73 year old female known to Dr. Carlean Purl. She has a history of diverticulitis (documented by CTscan 2012). Patient comes in today for evaluation of lower abdominal pain and loose stools with blood. Symptoms reminiscent of diverticulitis (she has had loose stools with blood with every episode of diverticulitis). Symptoms began Friday night, 5 days ago. Patient had 4 days of Cefdinir on hand which she completed yesterday. Pain resolved after a couple of days on antibiotics. Loose bloody stools improved, almost resolved. No fever.   Current Medications, Allergies, Past Medical History, Past Surgical History, Family History and Social History were reviewed in [employer] record.  Physical Exam: General: Pleasant, well developed , white female in no acute distress Head: Normocephalic and atraumatic Eyes:  sclerae anicteric, conjunctiva pink  Ears: Normal auditory acuity Lungs: Clear throughout to auscultation Heart: Regular rate and rhythm Abdomen: Soft, non distended, non-tender. No masses, no hepatomegaly. Normal bowel sounds Musculoskeletal: Symmetrical with no gross deformities  Extremities: No edema  Neurological: Alert oriented x 4, grossly nonfocal Psychological:  Alert and cooperative. Normal mood and affect  Assessment and Recommendations:   18. 73 year old with lower abdominal pain, bowel changes with blood. Symptoms reminscent of diverticulitis. Wonder about ischemic colitis as diverticulitis not usually associated with bleeding. Having said that, patient did have the same exact symptoms in 2012 at which time CT scan was compatible with sigmoid diverticulitis. Bleeding may have been perianal irritation from loose stool. Her symptoms quickly resolved after self medicating with 4 days of Cefdinir. Abdominal exam benign today. She is tolerating solids. At this point will hold off on prescribing additional antibiotics.  Patient will call if she has recurrent symptoms. She is up to date on colon cancer screening (Aug 2014)   2. Chronic chest pain. Cardiac workup in progress. She is wearing a holter monitor.  Advised to keep August appointment with Dr. Carlean Purl

## 2013-11-07 NOTE — Patient Instructions (Signed)
Keep your appointment with Dr. Silvano Rusk scheduled for 12-17-2013 @ 4:00 PM .  Please call us if you have any more reoccurring pain. [phone]

## 2013-11-08 DIAGNOSIS — M159 Polyosteoarthritis, unspecified: Secondary | ICD-10-CM | POA: Diagnosis not present

## 2013-11-08 DIAGNOSIS — M810 Age-related osteoporosis without current pathological fracture: Secondary | ICD-10-CM | POA: Diagnosis not present

## 2013-11-08 NOTE — Progress Notes (Signed)
Agree with Ms. Guenther's assessment and plan. Carl E. Gessner, MD, FACG   

## 2013-11-12 ENCOUNTER — Encounter: Payer: Self-pay | Admitting: Internal Medicine

## 2013-11-15 ENCOUNTER — Other Ambulatory Visit: Payer: Self-pay | Admitting: *Deleted

## 2013-11-15 DIAGNOSIS — R000 Tachycardia, unspecified: Secondary | ICD-10-CM

## 2013-11-20 ENCOUNTER — Ambulatory Visit
Admission: RE | Admit: 2013-11-20 | Discharge: 2013-11-20 | Disposition: A | Discharge status: Home / Self Care | Payer: Medicare Other | Source: Ambulatory Visit

## 2013-11-20 DIAGNOSIS — M810 Age-related osteoporosis without current pathological fracture: Secondary | ICD-10-CM | POA: Diagnosis not present

## 2013-11-20 DIAGNOSIS — Z1231 Encounter for screening mammogram for malignant neoplasm of breast: Secondary | ICD-10-CM

## 2013-11-27 ENCOUNTER — Ambulatory Visit (INDEPENDENT_AMBULATORY_CARE_PROVIDER_SITE_OTHER): Payer: Medicare Other | Admitting: Cardiology

## 2013-11-27 ENCOUNTER — Encounter: Payer: Self-pay | Admitting: Cardiology

## 2013-11-27 VITALS — BP 132/64 | HR 64 | Ht 63.0 in | Wt 144.0 lb

## 2013-11-27 DIAGNOSIS — I472 Ventricular tachycardia, unspecified: Secondary | ICD-10-CM

## 2013-11-27 DIAGNOSIS — I4729 Other ventricular tachycardia: Secondary | ICD-10-CM | POA: Diagnosis not present

## 2013-11-27 DIAGNOSIS — R0989 Other specified symptoms and signs involving the circulatory and respiratory systems: Secondary | ICD-10-CM | POA: Diagnosis not present

## 2013-11-27 DIAGNOSIS — R0609 Other forms of dyspnea: Secondary | ICD-10-CM | POA: Diagnosis not present

## 2013-11-27 DIAGNOSIS — R0600 Dyspnea, unspecified: Secondary | ICD-10-CM | POA: Insufficient documentation

## 2013-11-27 NOTE — Progress Notes (Signed)
Patient ID: [name], female   DOB: [birth year], 73 y.o.   MRN: [id number]    Patient Name: [name] [name] Date of Encounter: 11/27/2013  Primary Care Provider:  [name] Primary Cardiologist:  Dorothy Spark (previously Dr Verl Blalock)  Problem List   Past Medical History  Diagnosis Date  . MVP (mitral valve prolapse)   . Brady-tachy syndrome   . HLD (hyperlipidemia)   . Palpitation   . Dyspepsia   . Vasovagal syncope   . Colitis, ischemic   . Fatty liver   . Osteopenia   . Diverticulosis of colon   . IBS (irritable bowel syndrome)   . Incontinence, feces   . Rectocele   . Hypertension   . Diverticulitis   . Rectocele   . nhl dx'd 07/1999    chemo comp 2001; rituxin comp 2005  . Colon polyp 07/29/1993    with focal adenomatous changes  . Chest pain 10/16/13    normal coronary arteries on cath and normal EF   Past Surgical History  Procedure Laterality Date  . Pelvic prolapse repair    . Abdominal hysterectomy    . Bladder suspension    . Bunionectomy    . Cholecystectomy  2003  . Upper gastrointestinal endoscopy  10/2005    hiatal hernia  . Colonoscopy  7/200/, 11/2007    diverticulosis  . Abdominal surgery    . Cardiac catheterization  10/16/13    done for abnormal nuc, no CAD, normal EF    Allergies  Allergies  Allergen Reactions  . Ace Inhibitors   . Codeine Nausea Only  . Iodine Hives  . Iohexol Hives     Desc: 50 mg benadryl prior to exam   . Pamine Forte [Methscopolamine] Other (See Comments)    Unable to urinate  . Robinul [Glycopyrrolate] Other (See Comments)    Unable to urinate  . Sulfa Antibiotics Swelling  . Sulfonamide Derivatives Swelling    HPI  [name] [name] returns today for evaluation and management of some chest burning with exercise or walking. She was given sublingual nitroglycerin to take prior walking by Dr. Carlean Purl. She denies any chest pressure heaviness or typical angina. The burning does go away with  stopping. She does have a history of prolapse and atypical chest pain. We performed a stress nuclear study in June 2012 which was negative for ischemia with normal ejection fraction. She has not taken a nitroglycerin for fear it would give her a headache. She denies palpitations presyncope or syncope. Other than her age, she has no cardiac risk factors for coronary disease. She does have a history of reflux.  The patient has been experiencing worsening of dyspnea on exertion. Last year. She states that she has been always a walker but now her dyspnea is so bad that for the last month she hasn't been walking at all. She denies any chest pain. But her dyspnea would be her limiting factor when walking. She denies any palpitations, orthopnea, personal nocturnal dyspnea or lower extremity edema.  11/27/2013 - the patient underwent exercise nuclear stress testing with abnormal stress portion, with ST depressions and chest pain during exertion as well as hypotension. There was no perfusion defect but transient ischemic dilatation and therefore patient was referred for cardiac catheterization that was normal. The patient saw Cecilie Kicks and underwent 2 week event monitoring that showed 2 episodes of nonsustained ventricular tachycardia which total beats of 11 and 14. The patient states that recently she has been  experiencing a lot of palpitations that are associated with mild dizziness and shortness of breath.  Home Medications  Prior to Admission medications   Medication Sig Start Date End Date Taking? Authorizing Provider  aspirin EC 81 MG tablet Take 81 mg by mouth daily.   Yes Historical Provider, MD  atenolol (TENORMIN) 25 MG tablet TAKE ONE (1) TABLET EACH DAY   Yes Dorothy Spark, MD  calcium carbonate (OS-CAL) 600 MG TABS Take 600 mg by mouth 2 (two) times daily with a meal.     Yes Historical Provider, MD  esomeprazole (NEXIUM) 20 MG capsule Take 20 mg by mouth daily before breakfast.   Yes  Historical Provider, MD  fish oil-omega-3 fatty acids 1000 MG capsule Take 2 g by mouth daily.     Yes Historical Provider, MD  Multiple Vitamin (MULTIVITAMIN) tablet Take 1 tablet by mouth daily.     Yes Historical Provider, MD  Probiotic Product ([place]) CAPS Take 1 capsule by mouth daily. 07/24/12  Yes Gatha Mayer, MD    Family History  Family History  Problem Relation Age of Onset  . Colon cancer Father 12  . Prostate cancer Brother   . Colon polyps Brother   . Colon polyps Brother     Social History  History   Social History  . Marital Status: Married    Spouse Name: N/A    Number of Children: 1  . Years of Education: N/A   Occupational History  . Clerical work part time   .     Social History Main Topics  . Smoking status: Former Smoker -- 0.33 packs/day    Quit date: 05/04/1967  . Smokeless tobacco: Never Used     Comment: quit 1976  . Alcohol Use: No  . Drug Use: No  . Sexual Activity: Yes    Birth Control/ Protection: Surgical   Other Topics Concern  . Not on file   Social History Narrative  . No narrative on file     Review of Systems, as per HPI, otherwise negative General:  No chills, fever, night sweats or weight changes.  Cardiovascular:  No chest pain, dyspnea on exertion, edema, orthopnea, palpitations, paroxysmal nocturnal dyspnea. Dermatological: No rash, lesions/masses Respiratory: No cough, dyspnea Urologic: No hematuria, dysuria Abdominal:   No nausea, vomiting, diarrhea, bright red blood per rectum, melena, or hematemesis Neurologic:  No visual changes, wkns, changes in mental status. All other systems reviewed and are otherwise negative except as noted above.  Physical Exam  Blood pressure 132/64, pulse 64, height _0  (1.6 m), weight 144 lb (65.318 kg).  General: Pleasant, NAD Psych: Normal affect. Neuro: Alert and oriented X 3. Moves all extremities spontaneously. HEENT: Normal  Neck: Supple without bruits or  JVD. Lungs:  Resp regular and unlabored, CTA. Heart: RRR no s3, s4, midsystolic click followed by a soft murmur.. Abdomen: Soft, non-tender, non-distended, BS + x 4.  Extremities: No clubbing, cyanosis or edema. DP/PT/Radials 2+ and equal bilaterally.  Labs:  No results found for this basename: CKTOTAL, CKMB, TROPONINI,  in the last 72 hours Lab Results  Component Value Date   WBC 6.9 10/12/2013   HGB 13.7 10/12/2013   HCT 40.7 10/12/2013   MCV 91.1 10/12/2013   PLT 257.0 10/12/2013    No results found for this basename: DDIMER   No components found with this basename: POCBNP,     Component Value Date/Time   NA 143 10/12/2013 1305   NA  143 06/22/2013 1034   K 3.7 10/16/2013 0716   K 4.7 06/22/2013 1034   CL 106 10/12/2013 1305   CL 105 01/20/2012 1021   CO2 30 10/12/2013 1305   CO2 28 06/22/2013 1034   GLUCOSE 90 10/12/2013 1305   GLUCOSE 94 06/22/2013 1034   GLUCOSE 91 01/20/2012 1021   BUN 18 10/12/2013 1305   BUN 19.6 06/22/2013 1034   CREATININE 0.7 10/12/2013 1305   CREATININE 0.8 06/22/2013 1034   CALCIUM 10.9* 10/12/2013 1305   CALCIUM 10.9* 06/22/2013 1034   PROT 7.1 06/22/2013 1034   PROT 6.5 03/03/2011 1058   ALBUMIN 4.3 06/22/2013 1034   ALBUMIN 4.5 03/03/2011 1058   AST 23 06/22/2013 1034   AST 24 03/03/2011 1058   ALT 21 06/22/2013 1034   ALT 22 03/03/2011 1058   ALKPHOS 95 06/22/2013 1034   ALKPHOS 71 03/03/2011 1058   BILITOT 0.37 06/22/2013 1034   BILITOT 0.3 03/03/2011 1058   GFRNONAA 88.54 08/29/2008 0000   GFRAA  Value: >60        The eGFR has been calculated using the MDRD equation. This calculation has not been validated in all clinical 01/13/2007 0657   Lab Results  Component Value Date   CHOL 217* 02/06/2013   HDL 47 02/06/2013   LDLCALC 137* 02/06/2013   TRIG 166* 02/06/2013    Accessory Clinical Findings  Echocardiogram - 09/12/2013 Left ventricle: The cavity size was normal. Wall thickness was normal. Systolic function was normal. The estimated  ejection fraction was 55%. Wall motion was normal; there were no regional wall motion abnormalities. Aortic valve: Trileaflet. Doppler: There was no stenosis. No regurgitation. Aorta: Aortic root normal size. Mitral valve: Minimal prolapse of the posterior leaflet. Doppler: There was no evidence for stenosis. Mild regurgitation. Peak gradient: 98m Hg (D). Left atrium: The atrium was normal in size. Right ventricle: The cavity size was normal. Systolic function was normal. Pulmonic valve: The valve appears to be grossly normal. Tricuspid valve: Doppler: Mild regurgitation. Right atrium: The atrium was normal in size. Pericardium: The pericardium was normal in appearance. Systemic veins: Inferior vena cava: The vessel was normal in size; the respirophasic diameter changes were in the normal range (= 50%); findings are consistent with normal central venous pressure. Post procedure conclusions Ascending Aorta:  - Aortic root normal size.    Assessment & Plan  73year old female  1. dyspnea on exertion is progressively worsening, abnormal EKG portion of the stress test with normal cardiac catheterization. Event monitor showed 2 episodes of nonsustained ventricular tachycardia lasting 14 and 11 cycles. We don't have echocardiogram evaluating for LV and RV function and possible wall motion abnormalities however LVEF and normal wall motions were reported normal on nuclear stress testing. We will proceed with cardiac MRI to evaluate for biventricular function and most importantly to rule out infiltrative heart disease or possible scar, evaluate for possible cardiac sarcoidosis, and ARVC as a part of evaluation for  ventricular tachycardias. There is a chest CT from 2014 with no signs of lung sarcoidosis, however there are reported 5% of isolated cardiac sarcoidosis without findings of pulmonary sarcoidosis. If all results normal we will refer to EP for possible ablation.   2. Hypertension -  controlled  3. Mitral prolapse - associated with mild mitral regurgitation on the last echocardiogram in 2011, she only has a very mild murmur no need to repeat at this point.  4. Lipids - followed by primary care physician.  Followup after cardiac MRI.  Dorothy Spark, MD, [facility] 11/27/2013, 9:43 AM

## 2013-11-27 NOTE — Patient Instructions (Signed)
Your physician has requested that you have a cardiac MRI. Cardiac MRI uses a computer to create images of your heart as its beating, producing both still and moving pictures of your heart and major blood vessels. For further information please visit [email or web address] Please follow the instruction sheet given to you today for more information.  Your physician recommends that you schedule a follow-up appointment in: with Dr. Meda Coffee based on your results   Your physician recommends that you continue on your current medications as directed. Please refer to the Current Medication list given to you today.

## 2013-11-29 ENCOUNTER — Encounter: Payer: Self-pay | Admitting: Cardiology

## 2013-12-17 ENCOUNTER — Ambulatory Visit (INDEPENDENT_AMBULATORY_CARE_PROVIDER_SITE_OTHER): Payer: Medicare Other | Admitting: Internal Medicine

## 2013-12-17 ENCOUNTER — Encounter: Payer: Self-pay | Admitting: Internal Medicine

## 2013-12-17 VITALS — BP 120/70 | HR 68 | Ht 63.0 in | Wt 144.4 lb

## 2013-12-17 DIAGNOSIS — R079 Chest pain, unspecified: Secondary | ICD-10-CM | POA: Diagnosis not present

## 2013-12-17 NOTE — Assessment & Plan Note (Signed)
I continue to think this is cardiac and not GI in origin. Fortunately large vessels are okay, question if she has microvascular angina, or perhaps something in the myocardium itself. Dr. Meda Coffee is working this up with a cardiac MRI. I would be interested to see the results. In the meantime since she's had no benefit from PPI therapy she will taper off these.

## 2013-12-17 NOTE — Progress Notes (Signed)
   Subjective:    Patient ID: [name] [name], female    DOB: 04-11-1941, 73 y.o.   MRN: [id number]  HPI  The patient is here for followup. She was seen last month with what she felt was her diverticulitis flaring, that has resolved. Our nurse practitioner saw her.  She continues to have chest pain occurs with walking and is developed dyspnea on exertion. Over the years I have not thought this to be GERD. Everything retry with GERD treatment has not helped. She did not have anything revealing on cardiac workup either though this year she had an abnormal stress test. Cardiac catheterization was unrevealing. She is about to have a cardiac MRI.  She also now has nonsustained ventricular tachycardia and she feels palpitations with that.  Antacids, twice a day PPI and made no difference in her pain. Medications, allergies, past medical history, past surgical history, family history and social history are reviewed and updated in the EMR.  Review of Systems As above    Objective:   Physical Exam Well-developed well-nourished elderly white woman looking younger than stated age    Assessment & Plan:  Chest pain on exertion I continue to think this is cardiac and not GI in origin. Fortunately large vessels are okay, question if she has microvascular angina, or perhaps something in the myocardium itself. Dr. Meda Coffee is working this up with a cardiac MRI. I would be interested to see the results. In the meantime since she's had no benefit from PPI therapy she will taper off these.   I appreciate the opportunity to care for this patient. CC: [name] We'll also copy Dr. Ena Dawley

## 2013-12-17 NOTE — Patient Instructions (Signed)
Dr. Silvano Rusk is going to follow up on your cardiac MRI.   Stop your zegerid.  Jeris Penta off your Nexium as follows:  After being off the zegerid for a week, go to every other day until done.   I appreciate the opportunity to care for you.

## 2013-12-19 ENCOUNTER — Ambulatory Visit (HOSPITAL_COMMUNITY)
Admission: RE | Admit: 2013-12-19 | Discharge: 2013-12-19 | Disposition: A | Discharge status: Home / Self Care | Payer: Medicare Other | Source: Ambulatory Visit | Attending: Cardiology | Admitting: Cardiology

## 2013-12-19 DIAGNOSIS — I059 Rheumatic mitral valve disease, unspecified: Secondary | ICD-10-CM | POA: Insufficient documentation

## 2013-12-19 DIAGNOSIS — I079 Rheumatic tricuspid valve disease, unspecified: Secondary | ICD-10-CM | POA: Insufficient documentation

## 2013-12-19 DIAGNOSIS — I472 Ventricular tachycardia, unspecified: Secondary | ICD-10-CM

## 2013-12-19 DIAGNOSIS — I4729 Other ventricular tachycardia: Secondary | ICD-10-CM | POA: Diagnosis present

## 2013-12-19 MED ORDER — GADOBENATE DIMEGLUMINE 529 MG/ML IV SOLN
21.0000 mL | Freq: Once | INTRAVENOUS | Status: AC | PRN
  Administered 2013-12-19: 21 mL via INTRAVENOUS

## 2013-12-25 ENCOUNTER — Telehealth: Payer: Self-pay | Admitting: Cardiology

## 2013-12-25 NOTE — Telephone Encounter (Signed)
New problem   Pt want results of her Cardiac MRI. Please call pt.

## 2014-01-15 DIAGNOSIS — H43819 Vitreous degeneration, unspecified eye: Secondary | ICD-10-CM | POA: Diagnosis not present

## 2014-01-28 DIAGNOSIS — Z23 Encounter for immunization: Secondary | ICD-10-CM | POA: Diagnosis not present

## 2014-01-29 ENCOUNTER — Other Ambulatory Visit: Payer: Self-pay | Admitting: Cardiology

## 2014-01-30 ENCOUNTER — Encounter: Payer: Self-pay | Admitting: Internal Medicine

## 2014-01-30 ENCOUNTER — Ambulatory Visit (INDEPENDENT_AMBULATORY_CARE_PROVIDER_SITE_OTHER): Payer: Medicare Other | Admitting: Internal Medicine

## 2014-01-30 VITALS — BP 138/70 | HR 68 | Ht 63.5 in | Wt 145.0 lb

## 2014-01-30 DIAGNOSIS — I4729 Other ventricular tachycardia: Secondary | ICD-10-CM | POA: Diagnosis not present

## 2014-01-30 DIAGNOSIS — I472 Ventricular tachycardia: Secondary | ICD-10-CM | POA: Diagnosis not present

## 2014-01-30 NOTE — Patient Instructions (Signed)
Your physician recommends that you continue on your current medications as directed. Please refer to the Current Medication list given to you today.  No follow up is needed with Dr. Lovena Le. He will see you on an as needed basis.

## 2014-01-30 NOTE — Assessment & Plan Note (Signed)
I have discussed the benign nature of her symptoms. As they are currently minimally symptomatic, I have recommended she take her beta blocker and undergo watchful waiting. I would be glad to see her back as needed for worsening symptoms. Flecainide would be another medical option in conjunction with her beta blocker if she has bothersome symptoms.

## 2014-01-30 NOTE — Progress Notes (Signed)
HPI [name] [name] is referred today by Dr. Meda Coffee for evaluation of NSVT. She is a very pleasant 73 yo woman with a h/o chest pressure and subsequent evaluation demonstrating no CAD, normal LV function and no scar by cardiac MRI. She has had palpitations, particularly after undergoing catheterization and subsequent evaluation with a cardiac monitor demonstrating NSVT, likely from the RVOT. No 12 lead to be sure of location. Over the past few weeks, her symptoms have markedly improved. She has never had syncope. She does not use ETOH or much caffeine. Allergies  Allergen Reactions  . Pamine Forte [Methscopolamine] Other (See Comments)    Unable to urinate  . Robinul [Glycopyrrolate] Other (See Comments)    Unable to urinate  . Sulfa Antibiotics Swelling    Face swelling   . Sulfonamide Derivatives Swelling    Face swelling   . Ace Inhibitors Hives  . Codeine Nausea Only  . Iodine Hives  . Iohexol Hives     Desc: 50 mg benadryl prior to exam      Current Outpatient Prescriptions  Medication Sig Dispense Refill  . aspirin EC 81 MG tablet Take 81 mg by mouth daily.      Marland Kitchen atenolol (TENORMIN) 25 MG tablet TAKE ONE (1) TABLET EACH DAY  30 tablet  0  . calcium carbonate (OS-CAL) 600 MG TABS Take 600 mg by mouth 2 (two) times daily with a meal.        . esomeprazole (NEXIUM) 20 MG capsule Take 20 mg by mouth daily at 12 noon.      . fexofenadine (ALLEGRA) 180 MG tablet Take 180 mg by mouth daily.      . fish oil-omega-3 fatty acids 1000 MG capsule Take 1 g by mouth daily.       . Multiple Vitamin (MULTIVITAMIN) tablet Take 1 tablet by mouth daily.        Earney Navy Bicarbonate (ZEGERID OTC PO) Take 1 capsule by mouth daily.      . Polyvinyl Alcohol-Povidone (REFRESH OP) Apply 1 drop to eye 3 (three) times daily.      . Probiotic Product ([place]) CAPS Take 1 capsule by mouth daily.       No current facility-administered medications for this visit.      Past Medical History  Diagnosis Date  . MVP (mitral valve prolapse)   . Brady-tachy syndrome   . HLD (hyperlipidemia)   . Palpitation   . Dyspepsia   . Vasovagal syncope   . Colitis, ischemic   . Fatty liver   . Osteopenia   . Diverticulosis of colon   . IBS (irritable bowel syndrome)   . Incontinence, feces   . Rectocele   . Hypertension   . Diverticulitis   . Rectocele   . nhl dx'd 07/1999    chemo comp 2001; rituxin comp 2005  . Colon polyp 07/29/1993    with focal adenomatous changes  . Chest pain 10/16/13    normal coronary arteries on cath and normal EF    ROS:   All systems reviewed and negative except as noted in the HPI.   Past Surgical History  Procedure Laterality Date  . Pelvic prolapse repair    . Abdominal hysterectomy    . Bladder suspension    . Bunionectomy    . Cholecystectomy  2003  . Upper gastrointestinal endoscopy  10/2005    hiatal hernia  . Colonoscopy  7/200/, 11/2007    diverticulosis  .  Abdominal surgery    . Cardiac catheterization  10/16/13    done for abnormal nuc, no CAD, normal EF     Family History  Problem Relation Age of Onset  . Colon cancer Father 27  . Prostate cancer Brother   . Colon polyps Brother   . Colon polyps Brother      History   Social History  . Marital Status: Married    Spouse Name: N/A    Number of Children: 1  . Years of Education: N/A   Occupational History  . Clerical work part time   .     Social History Main Topics  . Smoking status: Former Smoker -- 0.33 packs/day    Quit date: 05/04/1967  . Smokeless tobacco: Never Used     Comment: quit 1976  . Alcohol Use: No  . Drug Use: No  . Sexual Activity: Yes    Birth Control/ Protection: Surgical   Other Topics Concern  . Not on file   Social History Narrative  . No narrative on file     BP 138/70  Pulse 68  Ht 5' 3.5" (1.613 m)  Wt 145 lb (65.772 kg)  BMI 25.28 kg/m2  Physical Exam:  Well appearing 73 yo woman,  NAD HEENT: Unremarkable Neck:  No JVD, no thyromegally Back:  No CVA tenderness Lungs:  Clear with no wheezes HEART:  Regular rate rhythm, no murmurs, no rubs, no clicks Abd:  soft, positive bowel sounds, no organomegally, no rebound, no guarding Ext:  2 plus pulses, no edema, no cyanosis, no clubbing Skin:  No rashes no nodules Neuro:  CN II through XII intact, motor grossly intact  EKG - nsr   Assess/Plan:

## 2014-02-15 ENCOUNTER — Ambulatory Visit (HOSPITAL_COMMUNITY)
Admission: RE | Admit: 2014-02-15 | Discharge: 2014-02-15 | Disposition: A | Discharge status: Home / Self Care | Payer: Medicare Other | Source: Ambulatory Visit | Attending: Family Medicine | Admitting: Family Medicine

## 2014-02-15 ENCOUNTER — Encounter: Payer: Self-pay | Admitting: Family Medicine

## 2014-02-15 ENCOUNTER — Ambulatory Visit (INDEPENDENT_AMBULATORY_CARE_PROVIDER_SITE_OTHER): Payer: Medicare Other | Admitting: Family Medicine

## 2014-02-15 VITALS — BP 128/76 | Ht 63.5 in | Wt 145.0 lb

## 2014-02-15 DIAGNOSIS — M25511 Pain in right shoulder: Secondary | ICD-10-CM

## 2014-02-15 DIAGNOSIS — M19011 Primary osteoarthritis, right shoulder: Secondary | ICD-10-CM | POA: Diagnosis not present

## 2014-02-15 NOTE — Progress Notes (Signed)
   Subjective:    Patient ID: [name] [name], female    DOB: 1940/12/09, 73 y.o.   MRN: [id number]  HPIFell on October 5th. Still having right shoulder pain. Missed the bottom step.   Patient did not completely fall but put significant stress on her right outstretched arm. Been it back somewhat. Since then has had ongoing shoulder pain. If anything worse lately. Minimal radiation down to arm. No radiation to neck.  Review of Systems No headache no chest pain no back pain ROS otherwise negative    Objective:   Physical Exam  Alert vitals stable. Lungs clear. Heart rhythm. Right shoulder definite positive impingement sign. No local deformities      Assessment & Plan:  Impression shoulder injury hopefully just rotator cuff strain. Plan Codman's exercises discussed. Shoulder x-ray. Anti-inflammatory medicine prescribed. WSL

## 2014-03-01 ENCOUNTER — Other Ambulatory Visit: Payer: Self-pay | Admitting: Cardiology

## 2014-03-27 ENCOUNTER — Encounter: Payer: Self-pay | Admitting: Family Medicine

## 2014-03-27 ENCOUNTER — Ambulatory Visit (INDEPENDENT_AMBULATORY_CARE_PROVIDER_SITE_OTHER): Payer: Medicare Other | Admitting: Family Medicine

## 2014-03-27 VITALS — BP 130/78 | Temp 98.3°F | Ht 63.5 in | Wt 146.2 lb

## 2014-03-27 DIAGNOSIS — J329 Chronic sinusitis, unspecified: Secondary | ICD-10-CM | POA: Diagnosis not present

## 2014-03-27 MED ORDER — AZITHROMYCIN 250 MG PO TABS: ORAL_TABLET | ORAL | Status: DC

## 2014-03-27 NOTE — Progress Notes (Signed)
   Subjective:    Patient ID: [name] [name], female    DOB: 16-Apr-1941, 73 y.o.   MRN: [id number]  Cough This is a new problem. The current episode started yesterday. Associated symptoms include nasal congestion and a sore throat.   Got up yest   Throat raspy and painful  And tender and flushed  Soothed the throat with concoction  No one else sick around  Non prod cough   al ot of drainage, a little color to it  mucinex blue and white     Review of Systems  HENT: Positive for sore throat.   Respiratory: Positive for cough.   no vomiting no diarrhea no rash ROS otherwise negative     Objective:   Physical Exam   Alert hydration good. Mild malaise. Vital stable HEENT frontal maxillary tenderness pharynx slight erythema neck supple. Lungs clear. Heart regular rate and rhythm.     Assessment & Plan:  Impression #1 acute rhinosinusitis plan antibiotics prescribed. Symptomatic care discussed. Warning signs discussed. WSL

## 2014-04-11 ENCOUNTER — Encounter (HOSPITAL_COMMUNITY): Payer: Self-pay | Admitting: Cardiology

## 2014-04-15 ENCOUNTER — Ambulatory Visit (INDEPENDENT_AMBULATORY_CARE_PROVIDER_SITE_OTHER): Payer: Medicare Other | Admitting: Family Medicine

## 2014-04-15 ENCOUNTER — Encounter: Payer: Self-pay | Admitting: Family Medicine

## 2014-04-15 VITALS — BP 126/64 | Temp 98.5°F | Ht 63.5 in | Wt 144.0 lb

## 2014-04-15 DIAGNOSIS — N390 Urinary tract infection, site not specified: Secondary | ICD-10-CM | POA: Diagnosis not present

## 2014-04-15 MED ORDER — CIPROFLOXACIN HCL 250 MG PO TABS: 250.0000 mg | ORAL_TABLET | Freq: Two times a day (BID) | ORAL | Status: DC

## 2014-04-15 NOTE — Progress Notes (Signed)
   Subjective:    Patient ID: [name] [name], female    DOB: 11-13-1940, 73 y.o.   MRN: [id number]  Urinary Tract Infection  This is a new problem. The current episode started yesterday. There has been no fever. Associated symptoms include urgency. Treatments tried: urostat.   No vomiting no diarrhea no headache no chest pain.  Last UTI 2 years ago.  Some dysuria. Some increase frequency some nocturia.   Review of Systems  Genitourinary: Positive for urgency.   ROS otherwise negative    Objective:   Physical Exam  Alert hydration good vitals stable. HEENT normal. Lungs clear. Heart regular in rhythm. No CVA tenderness. Abdomen benign.  Urinalysis too numerous to count white blood cells      Assessment & Plan:  Impression 1 urinary tract infection plan antibiotics prescribed. Symptomatic care discussed. Warning signs discussed. WSL

## 2014-05-18 ENCOUNTER — Telehealth: Payer: Self-pay | Admitting: Internal Medicine

## 2014-05-18 NOTE — Telephone Encounter (Signed)
lvm for  pt regarding to d.t. change of appt.....mailed pt appt sched/letter

## 2014-05-28 DIAGNOSIS — I872 Venous insufficiency (chronic) (peripheral): Secondary | ICD-10-CM | POA: Diagnosis not present

## 2014-05-28 DIAGNOSIS — L821 Other seborrheic keratosis: Secondary | ICD-10-CM | POA: Diagnosis not present

## 2014-05-29 ENCOUNTER — Telehealth: Payer: Self-pay | Admitting: Family Medicine

## 2014-05-29 MED ORDER — ALPRAZOLAM 0.25 MG PO TABS: 0.2500 mg | ORAL_TABLET | Freq: Every day | ORAL | Status: DC | PRN

## 2014-05-29 NOTE — Telephone Encounter (Signed)
Patient notified that script will faxed to pharmacy today once doctor signs rx. Patient verbalized understanding.

## 2014-05-29 NOTE — Telephone Encounter (Signed)
Xanax .25 numb 30, one qd prn anxiety

## 2014-05-29 NOTE — Telephone Encounter (Signed)
Pt called stating that she is having some anxiety going on due to her  Husband finding out he has cancer. Pt is having a hard time dealing   With the news and is wanting to know if something can be called in  To take the edge off.

## 2014-06-19 ENCOUNTER — Other Ambulatory Visit: Payer: Self-pay | Admitting: Cardiology

## 2014-06-20 ENCOUNTER — Other Ambulatory Visit (HOSPITAL_BASED_OUTPATIENT_CLINIC_OR_DEPARTMENT_OTHER): Payer: Medicare Other

## 2014-06-20 DIAGNOSIS — Z8579 Personal history of other malignant neoplasms of lymphoid, hematopoietic and related tissues: Secondary | ICD-10-CM

## 2014-06-20 DIAGNOSIS — Z87898 Personal history of other specified conditions: Secondary | ICD-10-CM

## 2014-06-20 DIAGNOSIS — K5730 Diverticulosis of large intestine without perforation or abscess without bleeding: Secondary | ICD-10-CM

## 2014-06-20 LAB — CBC WITH DIFFERENTIAL/PLATELET
BASO%: 0.6 % (ref 0.0–2.0)
BASOS ABS: 0 10*3/uL (ref 0.0–0.1)
EOS%: 2.7 % (ref 0.0–7.0)
Eosinophils Absolute: 0.2 10*3/uL (ref 0.0–0.5)
HEMATOCRIT: 43.5 % (ref 34.8–46.6)
HEMOGLOBIN: 14.4 g/dL (ref 11.6–15.9)
LYMPH#: 2 10*3/uL (ref 0.9–3.3)
LYMPH%: 29.7 % (ref 14.0–49.7)
MCH: 29.8 pg (ref 25.1–34.0)
MCHC: 33 g/dL (ref 31.5–36.0)
MCV: 90.2 fL (ref 79.5–101.0)
MONO#: 0.7 10*3/uL (ref 0.1–0.9)
MONO%: 10.7 % (ref 0.0–14.0)
NEUT#: 3.9 10*3/uL (ref 1.5–6.5)
NEUT%: 56.3 % (ref 38.4–76.8)
Platelets: 249 10*3/uL (ref 145–400)
RBC: 4.82 10*6/uL (ref 3.70–5.45)
RDW: 13.4 % (ref 11.2–14.5)
WBC: 6.9 10*3/uL (ref 3.9–10.3)

## 2014-06-20 LAB — COMPREHENSIVE METABOLIC PANEL (CC13)
ALBUMIN: 4 g/dL (ref 3.5–5.0)
ALT: 20 U/L (ref 0–55)
AST: 23 U/L (ref 5–34)
Alkaline Phosphatase: 68 U/L (ref 40–150)
Anion Gap: 9 mEq/L (ref 3–11)
BUN: 15.5 mg/dL (ref 7.0–26.0)
CALCIUM: 10.2 mg/dL (ref 8.4–10.4)
CHLORIDE: 104 meq/L (ref 98–109)
CO2: 28 meq/L (ref 22–29)
Creatinine: 0.8 mg/dL (ref 0.6–1.1)
EGFR: 79 mL/min/{1.73_m2} — AB (ref >90)
Glucose: 97 mg/dl (ref 70–140)
POTASSIUM: 4.2 meq/L (ref 3.5–5.1)
Sodium: 142 mEq/L (ref 136–145)
Total Bilirubin: 0.37 mg/dL (ref 0.20–1.20)
Total Protein: 6.6 g/dL (ref 6.4–8.3)

## 2014-06-20 LAB — LACTATE DEHYDROGENASE (CC13): LDH: 171 U/L (ref 125–245)

## 2014-06-21 ENCOUNTER — Ambulatory Visit (HOSPITAL_COMMUNITY)
Admission: RE | Admit: 2014-06-21 | Discharge: 2014-06-21 | Disposition: A | Discharge status: Home / Self Care | Payer: Medicare Other | Source: Ambulatory Visit | Attending: Internal Medicine | Admitting: Internal Medicine

## 2014-06-21 ENCOUNTER — Other Ambulatory Visit: Payer: Medicare Other

## 2014-06-21 DIAGNOSIS — C8290 Follicular lymphoma, unspecified, unspecified site: Secondary | ICD-10-CM | POA: Diagnosis not present

## 2014-06-21 DIAGNOSIS — K5730 Diverticulosis of large intestine without perforation or abscess without bleeding: Secondary | ICD-10-CM | POA: Diagnosis not present

## 2014-06-21 DIAGNOSIS — Z8579 Personal history of other malignant neoplasms of lymphoid, hematopoietic and related tissues: Secondary | ICD-10-CM | POA: Insufficient documentation

## 2014-06-21 DIAGNOSIS — Z87898 Personal history of other specified conditions: Secondary | ICD-10-CM

## 2014-06-21 MED ORDER — IOHEXOL 300 MG/ML  SOLN
50.0000 mL | Freq: Once | INTRAMUSCULAR | Status: AC | PRN
  Administered 2014-06-21: 50 mL via ORAL

## 2014-06-25 ENCOUNTER — Telehealth: Payer: Self-pay | Admitting: Internal Medicine

## 2014-06-25 ENCOUNTER — Ambulatory Visit (HOSPITAL_BASED_OUTPATIENT_CLINIC_OR_DEPARTMENT_OTHER): Payer: Medicare Other | Admitting: Internal Medicine

## 2014-06-25 ENCOUNTER — Encounter: Payer: Self-pay | Admitting: Internal Medicine

## 2014-06-25 VITALS — BP 122/68 | HR 62 | Temp 98.5°F | Resp 18 | Ht 63.5 in | Wt 141.1 lb

## 2014-06-25 DIAGNOSIS — C8590 Non-Hodgkin lymphoma, unspecified, unspecified site: Secondary | ICD-10-CM

## 2014-06-25 DIAGNOSIS — Z8579 Personal history of other malignant neoplasms of lymphoid, hematopoietic and related tissues: Secondary | ICD-10-CM | POA: Diagnosis not present

## 2014-06-25 NOTE — Telephone Encounter (Signed)
no vm.....mailed pt appt sched/avs and letter °

## 2014-06-25 NOTE — Progress Notes (Signed)
[facility]  [phone] [phone]  OFFICE PROGRESS NOTE    KRISTYL ATHENS [id number] [birth year] 74 y.o. 06/25/2014 9:41 AM  Principle Diagnosis: Non-Hodgkin's lymphoma, low-grade lymphoma initially diagnosed in March of 2001 with evidence for progressive disease noted in February of 2004   Prior Therapy: Status post initial chemotherapy with mitoxantrone, fludarabine, and Rituxan completed January 2002. After the patient was found to have progressive disease in February 2004 she was treated with single agent Rituxan given February 200 4 through August 2005  Current therapy: Observation  INTERVAL HISTORY: This is a very pleasant 74 years old white female returns to the clinic today accompanied by her husband for annual followup visit. The patient is feeling fine today with no specific complaints. She denied having any significant weight loss or night sweats. She denied having any chest pain, shortness breath, cough or hemoptysis. She has no nausea or vomiting, no fever or chills. The patient had repeat CT scan of the chest, abdomen and pelvis performed recently and she is here for evaluation and discussion of her scan results.   Current Outpatient Prescriptions on File Prior to Visit  Medication Sig Dispense Refill  . ALPRAZolam (XANAX) 0.25 MG tablet Take 1 tablet (0.25 mg total) by mouth daily as needed for anxiety. 30 tablet 0  . aspirin EC 81 MG tablet Take 81 mg by mouth daily.    Marland Kitchen atenolol (TENORMIN) 25 MG tablet TAKE ONE TABLET ONCE DAILY 30 tablet 1  . calcium carbonate (OS-CAL) 600 MG TABS Take 600 mg by mouth 2 (two) times daily with a meal.      . ciprofloxacin (CIPRO) 250 MG tablet Take 1 tablet (250 mg total) by mouth 2 (two) times daily. 10 tablet 0  . esomeprazole (NEXIUM) 20 MG capsule Take 20 mg by mouth daily at 12 noon.    . fexofenadine (ALLEGRA) 180 MG tablet Take 180 mg by mouth daily.    . fish oil-omega-3 fatty acids 1000 MG  capsule Take 1 g by mouth daily.     . Multiple Vitamin (MULTIVITAMIN) tablet Take 1 tablet by mouth daily.      Earney Navy Bicarbonate (ZEGERID OTC PO) Take 1 capsule by mouth daily.    . Polyvinyl Alcohol-Povidone (REFRESH OP) Apply 1 drop to eye 3 (three) times daily.    . Probiotic Product ([place]) CAPS Take 1 capsule by mouth daily.     No current facility-administered medications on file prior to visit.    Allergies:  Allergies  Allergen Reactions  . Pamine Forte [Methscopolamine] Other (See Comments)    Unable to urinate  . Robinul [Glycopyrrolate] Other (See Comments)    Unable to urinate  . Sulfa Antibiotics Swelling    Face swelling   . Sulfonamide Derivatives Swelling    Face swelling   . Ace Inhibitors Hives  . Codeine Nausea Only  . Iodine Hives  . Iohexol Hives     Desc: 50 mg benadryl prior to exam    Past Medical History  Diagnosis Date  . MVP (mitral valve prolapse)   . Brady-tachy syndrome   . HLD (hyperlipidemia)   . Palpitation   . Dyspepsia   . Vasovagal syncope   . Colitis, ischemic   . Fatty liver   . Osteopenia   . Diverticulosis of colon   . IBS (irritable bowel syndrome)   . Incontinence, feces   . Rectocele   . Hypertension   . Diverticulitis   .  Rectocele   . nhl dx'd 07/1999    chemo comp 2001; rituxin comp 2005  . Colon polyp 07/29/1993    with focal adenomatous changes  . Chest pain 10/16/13    normal coronary arteries on cath and normal EF     Surgical history, Social history, and Family History were reviewed and updated.  Review of Systems: Constitutional:  Negative for fever, chills, night sweats, anorexia, weight loss, pain. Cardiovascular: no chest pain or dyspnea on exertion Respiratory: positive for - cough Neurological: no TIA or stroke symptoms Dermatological: positive for Patient noticed a new subcutaneous nodules, affecting the right upper extremity and left side ENT: positive for - nasal  congestion Skin Gastrointestinal: no abdominal pain, change in bowel habits, or black or bloody stools Genito-Urinary: no dysuria, trouble voiding, or hematuria Hematological and Lymphatic: negative Breast: negative Musculoskeletal: negative Remaining ROS negative.  Physical Exam: Blood pressure 122/68, pulse 62, temperature 98.5 F (36.9 C), temperature source Oral, resp. rate 18, height 5' 3.5" (1.613 m), weight 141 lb 1.6 oz (64.003 kg), SpO2 100 %. ECOG:  General appearance: alert, cooperative, appears stated age and no distress Head: Normocephalic, without obvious abnormality, atraumatic Mouth: Clear, no evidence of thrush or mucositis. Membranes moist Neck: no adenopathy, no carotid bruit, no JVD, supple, symmetrical, trachea midline and thyroid not enlarged, symmetric, no tenderness/mass/nodules Lymph nodes: Cervical, supraclavicular, and axillary nodes normal. Resp: clear to auscultation bilaterally Cardio: regular rate and rhythm, S1, S2 normal, no murmur, click, rub or gallop GI: soft, non-tender; bowel sounds normal; no masses,  no organomegaly Extremities: extremities normal, atraumatic, no cyanosis or edema    Lab Results: Lab Results  Component Value Date   WBC 6.9 06/20/2014   HGB 14.4 06/20/2014   HCT 43.5 06/20/2014   MCV 90.2 06/20/2014   PLT 249 06/20/2014     Chemistry      Component Value Date/Time   NA 142 06/20/2014 0827   NA 143 10/12/2013 1305   K 4.2 06/20/2014 0827   K 3.7 10/16/2013 0716   CL 106 10/12/2013 1305   CL 105 01/20/2012 1021   CO2 28 06/20/2014 0827   CO2 30 10/12/2013 1305   BUN 15.5 06/20/2014 0827   BUN 18 10/12/2013 1305   CREATININE 0.8 06/20/2014 0827   CREATININE 0.7 10/12/2013 1305      Component Value Date/Time   CALCIUM 10.2 06/20/2014 0827   CALCIUM 10.9* 10/12/2013 1305   ALKPHOS 68 06/20/2014 0827   ALKPHOS 71 03/03/2011 1058   AST 23 06/20/2014 0827   AST 24 03/03/2011 1058   ALT 20 06/20/2014 0827   ALT  22 03/03/2011 1058   BILITOT 0.37 06/20/2014 0827   BILITOT 0.3 03/03/2011 1058       Radiological Studies: Ct Abdomen Pelvis Wo Contrast  06/21/2014   CLINICAL DATA:  Patient with history of non Hodgkin's lymphoma diagnosed 2001. Contrast allergy.  EXAM: CT CHEST, ABDOMEN AND PELVIS WITHOUT CONTRAST  TECHNIQUE: Multidetector CT imaging of the chest, abdomen and pelvis was performed following the standard protocol without IV contrast.  COMPARISON:  CT 03/15/2013  FINDINGS: CT CHEST FINDINGS  Mediastinum/Nodes: Normal heart size. No pericardial effusion. No axillary, mediastinal or hilar lymphadenopathy. Small hiatal hernia.  Lungs/Pleura: Central airways are patent. No consolidative or nodular pulmonary opacities. Interval resolution of previously described ground-glass opacity within the superior segment right lower lobe. No pleural effusion or pneumothorax.  CT ABDOMEN AND PELVIS FINDINGS  Hepatobiliary: Liver is normal in size and contour.  Status post cholecystectomy.  Pancreas: Unremarkable  Spleen: Unremarkable  Adrenals/Urinary Tract: Normal adrenal glands. Kidneys are symmetric in size. No hydronephrosis. Urinary bladder is decompressed.  Stomach/Bowel: Sigmoid colonic diverticulosis. No CT evidence for acute diverticulitis. No evidence for bowel obstruction. No free fluid or free intraperitoneal air. Ascending colon relatively decompressed, limiting evaluation.  Vascular/Lymphatic: Normal caliber abdominal aorta with scattered calcified atherosclerotic plaque. No retroperitoneal adenopathy.  Other: Status post hysterectomy.  Musculoskeletal: No aggressive or acute appearing osseous lesions.  IMPRESSION: No lymphadenopathy in the chest, abdomen or pelvis.   Electronically Signed   By: Lovey Newcomer M.D.   On: 06/21/2014 09:15   Ct Chest Wo Contrast  06/21/2014   CLINICAL DATA:  Patient with history of non Hodgkin's lymphoma diagnosed 2001. Contrast allergy.  EXAM: CT CHEST, ABDOMEN AND PELVIS  WITHOUT CONTRAST  TECHNIQUE: Multidetector CT imaging of the chest, abdomen and pelvis was performed following the standard protocol without IV contrast.  COMPARISON:  CT 03/15/2013  FINDINGS: CT CHEST FINDINGS  Mediastinum/Nodes: Normal heart size. No pericardial effusion. No axillary, mediastinal or hilar lymphadenopathy. Small hiatal hernia.  Lungs/Pleura: Central airways are patent. No consolidative or nodular pulmonary opacities. Interval resolution of previously described ground-glass opacity within the superior segment right lower lobe. No pleural effusion or pneumothorax.  CT ABDOMEN AND PELVIS FINDINGS  Hepatobiliary: Liver is normal in size and contour. Status post cholecystectomy.  Pancreas: Unremarkable  Spleen: Unremarkable  Adrenals/Urinary Tract: Normal adrenal glands. Kidneys are symmetric in size. No hydronephrosis. Urinary bladder is decompressed.  Stomach/Bowel: Sigmoid colonic diverticulosis. No CT evidence for acute diverticulitis. No evidence for bowel obstruction. No free fluid or free intraperitoneal air. Ascending colon relatively decompressed, limiting evaluation.  Vascular/Lymphatic: Normal caliber abdominal aorta with scattered calcified atherosclerotic plaque. No retroperitoneal adenopathy.  Other: Status post hysterectomy.  Musculoskeletal: No aggressive or acute appearing osseous lesions.  IMPRESSION: No lymphadenopathy in the chest, abdomen or pelvis.   Electronically Signed   By: Lovey Newcomer M.D.   On: 06/21/2014 09:15   ASSESSMENT AND PLAN: This is a very pleasant 74 years old white female with history of non-Hodgkin lymphoma, low-grade lymphoma diagnosed in March of 2001, status post treatment with mitoxantrone, fludarabine and Rituxan. She had evidence for disease recurrence in April 2004 and the patient was treated with single agent Rituxan completed in August of 2005 and has been observation since that time.  The recent CT scan of the chest, abdomen and pelvis showed no  evidence for disease recurrence. I discussed the scan results with the patient and her husband. I recommended for her to continue on observation with repeat CBC, comprehensive metabolic panel and LDH in one year. I don't see a need to continue with annual CT scans at this point after more than 11 years from the time of her diagnosis. She was advised to call immediately if she has any concerning symptoms in the interval. The patient and her husband agreed to the current plan.  Disclaimer: This note was dictated with voice recognition software. Similar sounding words can inadvertently be transcribed and may not be corrected upon review.  Eilleen Kempf., MD 06/25/2014

## 2014-08-07 DIAGNOSIS — H43819 Vitreous degeneration, unspecified eye: Secondary | ICD-10-CM | POA: Diagnosis not present

## 2014-08-07 DIAGNOSIS — H35342 Macular cyst, hole, or pseudohole, left eye: Secondary | ICD-10-CM | POA: Diagnosis not present

## 2014-08-08 ENCOUNTER — Other Ambulatory Visit: Payer: Self-pay | Admitting: Cardiology

## 2014-08-08 NOTE — Telephone Encounter (Signed)
Dorothy Spark, MD at 11/27/2013 9:42 AM  atenolol (TENORMIN) 25 MG tabletTAKE ONE (1) TABLET EACH DAY Your physician recommends that you continue on your current medications as directed. Please refer to the Current Medication list given to you today.

## 2014-09-11 ENCOUNTER — Encounter: Payer: Self-pay | Admitting: Family Medicine

## 2014-09-11 ENCOUNTER — Ambulatory Visit (INDEPENDENT_AMBULATORY_CARE_PROVIDER_SITE_OTHER): Payer: Medicare Other | Admitting: Family Medicine

## 2014-09-11 VITALS — BP 122/74 | Temp 98.2°F | Ht 63.5 in | Wt 144.0 lb

## 2014-09-11 DIAGNOSIS — Z139 Encounter for screening, unspecified: Secondary | ICD-10-CM

## 2014-09-11 DIAGNOSIS — J301 Allergic rhinitis due to pollen: Secondary | ICD-10-CM | POA: Diagnosis not present

## 2014-09-11 DIAGNOSIS — Z13220 Encounter for screening for lipoid disorders: Secondary | ICD-10-CM

## 2014-09-11 DIAGNOSIS — H6982 Other specified disorders of Eustachian tube, left ear: Secondary | ICD-10-CM

## 2014-09-11 NOTE — Patient Instructions (Signed)
I highly recommend using an OTC allergy nasal spray like Nasacort in addition to the allegra.  If signs of sinus infection kick in we will need to call in an antibiotic. Thanks, Dr.Dimitrious Micciche

## 2014-09-11 NOTE — Progress Notes (Signed)
   Subjective:    Patient ID: [name] [name], female    DOB: 28-Jul-1940, 74 y.o.   MRN: [id number]  Sinusitis This is a new problem. Episode onset: 2 weeks ago. Associated symptoms include congestion and coughing. Pertinent negatives include no ear pain or shortness of breath. (Ears popping) Treatments tried: mucinex. The treatment provided mild relief.   Felt pressure in the ear In the am with ear pressure Patient relates progressive troubles over the past few weeks it was worse a few weeks ago now it's more just head congestion drainage coughing  Review of Systems  Constitutional: Negative for fever and activity change.  HENT: Positive for congestion and rhinorrhea. Negative for ear pain.   Eyes: Negative for discharge.  Respiratory: Positive for cough. Negative for shortness of breath and wheezing.   Cardiovascular: Negative for chest pain.       Objective:   Physical Exam  Constitutional: She appears well-developed.  HENT:  Head: Normocephalic.  Nose: Nose normal.  Mouth/Throat: Oropharynx is clear and moist. No oropharyngeal exudate.  Neck: Neck supple.  Cardiovascular: Normal rate and normal heart sounds.   No murmur heard. Pulmonary/Chest: Effort normal and breath sounds normal. She has no wheezes.  Lymphadenopathy:    She has no cervical adenopathy.  Skin: Skin is warm and dry.  Nursing note and vitals reviewed.         Assessment & Plan:  Sinusitis antibiotics prescribed warning signs discussed follow-up if problems  If ongoing trouble with allergies consider steroid injection patient defers currently OTC measures along with steroid nasal spray recommended

## 2014-09-14 DIAGNOSIS — Z139 Encounter for screening, unspecified: Secondary | ICD-10-CM | POA: Diagnosis not present

## 2014-09-14 DIAGNOSIS — Z13220 Encounter for screening for lipoid disorders: Secondary | ICD-10-CM | POA: Diagnosis not present

## 2014-09-14 DIAGNOSIS — E785 Hyperlipidemia, unspecified: Secondary | ICD-10-CM | POA: Diagnosis not present

## 2014-09-20 ENCOUNTER — Telehealth: Payer: Self-pay | Admitting: Family Medicine

## 2014-09-20 NOTE — Telephone Encounter (Signed)
Lab core sent [place] cholesterol profile on this patient. It did not look bad glucose look good but it is recommended for the patient be on a low-cholesterol diet./Low fat diet important for the patient to follow-up by the fall time for wellness exam. I wrote this out on a letterhead mail it to the patient know need to call.

## 2014-10-09 ENCOUNTER — Encounter: Payer: Self-pay | Admitting: Family Medicine

## 2014-10-17 ENCOUNTER — Ambulatory Visit (INDEPENDENT_AMBULATORY_CARE_PROVIDER_SITE_OTHER): Payer: Medicare Other | Admitting: Family Medicine

## 2014-10-17 ENCOUNTER — Encounter: Payer: Self-pay | Admitting: Family Medicine

## 2014-10-17 VITALS — BP 108/66 | Temp 98.6°F | Ht 63.0 in | Wt 142.0 lb

## 2014-10-17 DIAGNOSIS — J209 Acute bronchitis, unspecified: Secondary | ICD-10-CM

## 2014-10-17 MED ORDER — AMOXICILLIN 500 MG PO CAPS: 500.0000 mg | ORAL_CAPSULE | Freq: Three times a day (TID) | ORAL | Status: DC

## 2014-10-17 NOTE — Progress Notes (Signed)
   Subjective:    Patient ID: [name] [name], female    DOB: Sep 07, 1940, 74 y.o.   MRN: [id number]  Cough This is a new problem. The current episode started yesterday. Associated symptoms include a sore throat. Associated symptoms comments: congestion.   Some chills several days ago. Mild headache.  Diminished energy.  Substantial stress with husband under chemotherapy for cancer.   Review of Systems  HENT: Positive for sore throat.   Respiratory: Positive for cough.        Objective:   Physical Exam  Alert vitals stable HET moderate nasal congestion pharynx slight erythema neck supple. Lungs clear. Heart regular in rhythm.      Assessment & Plan:  Impression acute bronchitis plan anti-bites prescribed. Symptom care discussed warning signs discussed WSL

## 2014-11-05 ENCOUNTER — Other Ambulatory Visit: Payer: Self-pay

## 2014-11-05 ENCOUNTER — Encounter: Payer: Self-pay | Admitting: Family Medicine

## 2014-11-05 ENCOUNTER — Ambulatory Visit (INDEPENDENT_AMBULATORY_CARE_PROVIDER_SITE_OTHER): Payer: Medicare Other | Admitting: Family Medicine

## 2014-11-05 VITALS — BP 122/70 | Temp 98.3°F | Ht 63.0 in | Wt 142.0 lb

## 2014-11-05 DIAGNOSIS — Z1231 Encounter for screening mammogram for malignant neoplasm of breast: Secondary | ICD-10-CM

## 2014-11-05 DIAGNOSIS — J329 Chronic sinusitis, unspecified: Secondary | ICD-10-CM

## 2014-11-05 DIAGNOSIS — J310 Chronic rhinitis: Secondary | ICD-10-CM

## 2014-11-05 MED ORDER — CEFDINIR 300 MG PO CAPS: 300.0000 mg | ORAL_CAPSULE | Freq: Two times a day (BID) | ORAL | Status: DC

## 2014-11-05 MED ORDER — FLUCONAZOLE 150 MG PO TABS: ORAL_TABLET | ORAL | Status: DC

## 2014-11-05 NOTE — Progress Notes (Signed)
   Subjective:    Patient ID: [name] [name], female    DOB: [birth year], 74 y.o.   MRN: [id number]  Sore Throat  This is a recurrent problem. Episode onset: was seen on june 16th. finished amoxil. Associated symptoms include congestion and ear pain. Associated symptoms comments: Eye drainage. Treatments tried: amoxil, nasacort, mucinex.   Took all the amoxil up, transiently improved and got worse  Then sore throat painful  Frontal headache and disch fr nasal cavity into the eye  Dry cough but not prod fr below,  Dim energy  patient under a lot of stress with husbands sickness   Review of Systems  HENT: Positive for congestion and ear pain.    no vomiting no diarrhea positive sore throat morning.     Objective:   Physical Exam Alert moderate malaise. Frontal tenderness pharynx normal neck supple. Lungs clear heart regular rate and rhythm tender anterior cervical lymph nodes       Assessment & Plan:  Impression rhinosinusitis with secondary lymphadenitis plan Omnicef twice a day 14 days due to persistent/resistant sinus infection. Diflucan in case. Symptomatic care discussed WSL

## 2014-11-14 DIAGNOSIS — M810 Age-related osteoporosis without current pathological fracture: Secondary | ICD-10-CM | POA: Diagnosis not present

## 2014-11-15 ENCOUNTER — Ambulatory Visit (INDEPENDENT_AMBULATORY_CARE_PROVIDER_SITE_OTHER): Payer: Medicare Other | Admitting: Nurse Practitioner

## 2014-11-15 ENCOUNTER — Encounter: Payer: Self-pay | Admitting: Nurse Practitioner

## 2014-11-15 VITALS — BP 122/78 | Ht 63.0 in | Wt 142.0 lb

## 2014-11-15 DIAGNOSIS — Z23 Encounter for immunization: Secondary | ICD-10-CM | POA: Diagnosis not present

## 2014-11-15 DIAGNOSIS — Z0000 Encounter for general adult medical examination without abnormal findings: Secondary | ICD-10-CM

## 2014-11-15 DIAGNOSIS — Z01419 Encounter for gynecological examination (general) (routine) without abnormal findings: Secondary | ICD-10-CM

## 2014-11-15 MED ORDER — PNEUMOCOCCAL 13-VAL CONJ VACC IM SUSP
0.5000 mL | Freq: Once | INTRAMUSCULAR | Status: AC
  Administered 2014-11-15: 0.5 mL via INTRAMUSCULAR

## 2014-11-19 ENCOUNTER — Encounter: Payer: Self-pay | Admitting: Nurse Practitioner

## 2014-11-19 DIAGNOSIS — M150 Primary generalized (osteo)arthritis: Secondary | ICD-10-CM | POA: Diagnosis not present

## 2014-11-19 DIAGNOSIS — H40053 Ocular hypertension, bilateral: Secondary | ICD-10-CM | POA: Insufficient documentation

## 2014-11-19 DIAGNOSIS — H35342 Macular cyst, hole, or pseudohole, left eye: Secondary | ICD-10-CM | POA: Insufficient documentation

## 2014-11-19 DIAGNOSIS — H04129 Dry eye syndrome of unspecified lacrimal gland: Secondary | ICD-10-CM | POA: Insufficient documentation

## 2014-11-19 DIAGNOSIS — H40009 Preglaucoma, unspecified, unspecified eye: Secondary | ICD-10-CM | POA: Insufficient documentation

## 2014-11-19 DIAGNOSIS — M810 Age-related osteoporosis without current pathological fracture: Secondary | ICD-10-CM | POA: Diagnosis not present

## 2014-11-19 NOTE — Progress Notes (Signed)
   Subjective:    Patient ID: [name] [name], female    DOB: 14-Apr-1941, 74 y.o.   MRN: [id number]  HPI presents for her wellness exam. Married, same sexual partner has had a hysterectomy with BSO. Regular skin cancer screening with dermatology. Regular eye exams, needs dental exam. Gets regular follow-up with oncology and cardiology. Bone density studies done through rheumatologist. Constipation controlled with stool softeners and probiotics. Completing a course of [place] for her sinuses. Much improved. Has 3 more doses of Omnicef. No fever or sore throat. Color is now clear. Rare cough. Mild left ear pain. Glands much improved. Mild left-sided congestion.    Review of Systems  Constitutional: Negative for fever, activity change, appetite change and fatigue.  HENT: Positive for congestion, ear pain and postnasal drip. Negative for dental problem, sinus pressure and sore throat.   Respiratory: Negative for cough, chest tightness, shortness of breath and wheezing.   Cardiovascular: Negative for chest pain.  Gastrointestinal: Negative for nausea, vomiting, abdominal pain, diarrhea, constipation, blood in stool and abdominal distention.  Genitourinary: Negative for dysuria, urgency, frequency, vaginal discharge, enuresis, difficulty urinating, genital sores and pelvic pain.       Objective:   Physical Exam  Constitutional: She is oriented to person, place, and time. She appears well-developed. No distress.  HENT:  Right Ear: External ear normal.  Left Ear: External ear normal.  Mouth/Throat: Oropharynx is clear and moist.  Neck: Normal range of motion. Neck supple. No tracheal deviation present. No thyromegaly present.  Cardiovascular: Normal rate, regular rhythm and normal heart sounds.  Exam reveals no gallop.   No murmur heard. Pulmonary/Chest: Effort normal and breath sounds normal.  Abdominal: Soft. She exhibits no distension. There is no tenderness.  Genitourinary: Vagina normal.  No vaginal discharge found.  External GU: No rashes or lesions. Vagina pale, no discharge. Bimanual exam normal. Rectal exam no masses noted, no stool for Hemoccult.  Musculoskeletal: She exhibits no edema.  Lymphadenopathy:    She has no cervical adenopathy.  Neurological: She is alert and oriented to person, place, and time.  Skin: Skin is warm and dry. No rash noted.  Psychiatric: She has a normal mood and affect. Her behavior is normal.  Vitals reviewed.  Breast exam: Areas of dense tissue, minimal fine nodularity, no dominant masses. Axilla no adenopathy.        Assessment & Plan:  Well woman exam - Plan: POC Hemoccult Bld/Stl (3-Cd Home Screen)  Need for vaccination - Plan: pneumococcal 13-valent conjugate vaccine (PREVNAR 13) injection 0.5 mL  Encouraged continued activity and healthy diet. Continue follow-up with specialist as planned. Patient plans to schedule her own mammogram. Recommend daily vitamin D and calcium supplementation. Return in about 1 year (around 11/15/2015) for physical.

## 2014-11-20 ENCOUNTER — Other Ambulatory Visit: Payer: Self-pay | Admitting: *Deleted

## 2014-11-20 DIAGNOSIS — Z01419 Encounter for gynecological examination (general) (routine) without abnormal findings: Secondary | ICD-10-CM

## 2014-11-20 LAB — POC HEMOCCULT BLD/STL (HOME/3-CARD/SCREEN)
CARD #2 DATE: 7162016
CARD #3 DATE: 7172016
Card #3 Fecal Occult Blood, POC: NEGATIVE
FECAL OCCULT BLD: NEGATIVE
Fecal Occult Blood, POC: NEGATIVE
OCCULT BLOOD DATE: 7152016

## 2014-11-27 DIAGNOSIS — M810 Age-related osteoporosis without current pathological fracture: Secondary | ICD-10-CM | POA: Diagnosis not present

## 2014-12-05 ENCOUNTER — Ambulatory Visit: Payer: Medicare Other

## 2014-12-12 DIAGNOSIS — L821 Other seborrheic keratosis: Secondary | ICD-10-CM | POA: Diagnosis not present

## 2014-12-12 DIAGNOSIS — L309 Dermatitis, unspecified: Secondary | ICD-10-CM | POA: Diagnosis not present

## 2014-12-12 DIAGNOSIS — L918 Other hypertrophic disorders of the skin: Secondary | ICD-10-CM | POA: Diagnosis not present

## 2014-12-12 DIAGNOSIS — L720 Epidermal cyst: Secondary | ICD-10-CM | POA: Diagnosis not present

## 2014-12-12 DIAGNOSIS — D2272 Melanocytic nevi of left lower limb, including hip: Secondary | ICD-10-CM | POA: Diagnosis not present

## 2014-12-18 DIAGNOSIS — H04129 Dry eye syndrome of unspecified lacrimal gland: Secondary | ICD-10-CM | POA: Diagnosis not present

## 2015-01-10 ENCOUNTER — Ambulatory Visit
Admission: RE | Admit: 2015-01-10 | Discharge: 2015-01-10 | Disposition: A | Discharge status: Home / Self Care | Payer: Medicare Other | Source: Ambulatory Visit

## 2015-01-10 DIAGNOSIS — Z1231 Encounter for screening mammogram for malignant neoplasm of breast: Secondary | ICD-10-CM | POA: Diagnosis not present

## 2015-01-20 ENCOUNTER — Encounter: Payer: Self-pay | Admitting: Internal Medicine

## 2015-02-12 DIAGNOSIS — Z23 Encounter for immunization: Secondary | ICD-10-CM | POA: Diagnosis not present

## 2015-02-25 ENCOUNTER — Other Ambulatory Visit: Payer: Self-pay | Admitting: Cardiology

## 2015-03-07 ENCOUNTER — Encounter: Payer: Self-pay | Admitting: Nurse Practitioner

## 2015-03-07 ENCOUNTER — Ambulatory Visit (INDEPENDENT_AMBULATORY_CARE_PROVIDER_SITE_OTHER): Payer: Medicare Other | Admitting: Nurse Practitioner

## 2015-03-07 VITALS — BP 100/70 | Temp 98.2°F | Ht 63.0 in | Wt 145.1 lb

## 2015-03-07 DIAGNOSIS — R5383 Other fatigue: Secondary | ICD-10-CM | POA: Diagnosis not present

## 2015-03-07 DIAGNOSIS — M25511 Pain in right shoulder: Secondary | ICD-10-CM

## 2015-03-08 LAB — CBC WITH DIFFERENTIAL/PLATELET
BASOS: 1 %
Basophils Absolute: 0 10*3/uL (ref 0.0–0.2)
EOS (ABSOLUTE): 0.2 10*3/uL (ref 0.0–0.4)
Eos: 2 %
Hematocrit: 40.9 % (ref 34.0–46.6)
Hemoglobin: 14.2 g/dL (ref 11.1–15.9)
IMMATURE GRANS (ABS): 0 10*3/uL (ref 0.0–0.1)
IMMATURE GRANULOCYTES: 0 %
LYMPHS: 30 %
Lymphocytes Absolute: 2.2 10*3/uL (ref 0.7–3.1)
MCH: 30.6 pg (ref 26.6–33.0)
MCHC: 34.7 g/dL (ref 31.5–35.7)
MCV: 88 fL (ref 79–97)
Monocytes Absolute: 0.9 10*3/uL (ref 0.1–0.9)
Monocytes: 12 %
NEUTROS PCT: 55 %
Neutrophils Absolute: 4 10*3/uL (ref 1.4–7.0)
PLATELETS: 268 10*3/uL (ref 150–379)
RBC: 4.64 x10E6/uL (ref 3.77–5.28)
RDW: 13.2 % (ref 12.3–15.4)
WBC: 7.3 10*3/uL (ref 3.4–10.8)

## 2015-03-08 LAB — HEPATIC FUNCTION PANEL
ALK PHOS: 70 IU/L (ref 39–117)
ALT: 20 IU/L (ref 0–32)
AST: 20 IU/L (ref 0–40)
Albumin: 4.6 g/dL (ref 3.5–4.8)
BILIRUBIN, DIRECT: 0.06 mg/dL (ref 0.00–0.40)
Bilirubin Total: <0.2 mg/dL (ref 0.0–1.2)
Total Protein: 6.3 g/dL (ref 6.0–8.5)

## 2015-03-08 LAB — BASIC METABOLIC PANEL
BUN / CREAT RATIO: 20 (ref 11–26)
BUN: 15 mg/dL (ref 8–27)
CO2: 25 mmol/L (ref 18–29)
CREATININE: 0.76 mg/dL (ref 0.57–1.00)
Calcium: 10.6 mg/dL — ABNORMAL HIGH (ref 8.7–10.3)
Chloride: 101 mmol/L (ref 97–106)
GFR, EST AFRICAN AMERICAN: 89 mL/min/{1.73_m2} (ref >59)
GFR, EST NON AFRICAN AMERICAN: 78 mL/min/{1.73_m2} (ref >59)
Glucose: 93 mg/dL (ref 65–99)
Potassium: 4.3 mmol/L (ref 3.5–5.2)
SODIUM: 142 mmol/L (ref 136–144)

## 2015-03-08 LAB — TSH: TSH: 2.69 u[IU]/mL (ref 0.450–4.500)

## 2015-03-09 ENCOUNTER — Encounter: Payer: Self-pay | Admitting: Nurse Practitioner

## 2015-03-10 ENCOUNTER — Encounter: Payer: Self-pay | Admitting: Nurse Practitioner

## 2015-03-10 NOTE — Progress Notes (Signed)
Subjective:  Presents for complaints of increased fatigue over the past several months. No fever. Describes as generalized decrease in energy. Staying active. No overt depression symptoms. Has a history of lymphoma, regular follow-up with oncology. Also complaints of right shoulder pain. [place] last October, landed on carpet which cause some discomfort. In June landed hard on her right shoulder. Since then has had pain in the shoulder with certain movements. Will radiate into the right upper arm at times. No numbness or weakness. Has osteopenia. Patient questioning whether B-12 shots may help, states some of her friends are getting these.  Objective:   BP 100/70 mmHg  Temp(Src) 98.2 F (36.8 C) (Oral)  Ht 5\' 3"  (1.6 m)  Wt 145 lb 2 oz (65.828 kg)  BMI 25.71 kg/m2 NAD. Alert, oriented. Lungs clear. Heart regular rate rhythm. Thyroid normal limit to palpation, no mass or quarter, nontender. Minimal tenderness with palpation of the right shoulder joint line. Can perform full active ROM of right shoulder. Hesitation and pain noted with rotation of the shoulder above shoulder joint line. Hand and arm strength 5+ bilateral. Radial pulses strong. Sensation grossly intact.  Assessment:  Other fatigue - Plan: Vitamin B12, CBC with Differential/Platelet, Hepatic function panel, Basic metabolic panel, TSH  Right shoulder pain  Plan: After the visit, lab called to say that her insurance would not cover a B-12 level which was canceled. Explained the B-12 should not be given as a supplement unless there is a true deficiency. This should show up on her CBC as pernicious anemia. Will refer to [place] for evaluation of her right shoulder pain. Follow-up with oncology as planned. Recheck here if fatigue persists.

## 2015-03-14 ENCOUNTER — Encounter: Payer: Self-pay | Admitting: Family Medicine

## 2015-04-01 DIAGNOSIS — M25511 Pain in right shoulder: Secondary | ICD-10-CM | POA: Diagnosis not present

## 2015-04-01 DIAGNOSIS — M7541 Impingement syndrome of right shoulder: Secondary | ICD-10-CM | POA: Diagnosis not present

## 2015-04-18 ENCOUNTER — Ambulatory Visit (INDEPENDENT_AMBULATORY_CARE_PROVIDER_SITE_OTHER): Payer: Medicare Other | Admitting: Cardiology

## 2015-04-18 ENCOUNTER — Encounter: Payer: Self-pay | Admitting: Cardiology

## 2015-04-18 VITALS — BP 122/82 | HR 61 | Ht 63.0 in | Wt 142.0 lb

## 2015-04-18 DIAGNOSIS — I472 Ventricular tachycardia: Secondary | ICD-10-CM

## 2015-04-18 DIAGNOSIS — I10 Essential (primary) hypertension: Secondary | ICD-10-CM | POA: Insufficient documentation

## 2015-04-18 DIAGNOSIS — R079 Chest pain, unspecified: Secondary | ICD-10-CM

## 2015-04-18 DIAGNOSIS — I341 Nonrheumatic mitral (valve) prolapse: Secondary | ICD-10-CM | POA: Diagnosis not present

## 2015-04-18 DIAGNOSIS — M7541 Impingement syndrome of right shoulder: Secondary | ICD-10-CM | POA: Diagnosis not present

## 2015-04-18 DIAGNOSIS — I4729 Other ventricular tachycardia: Secondary | ICD-10-CM

## 2015-04-18 DIAGNOSIS — R42 Dizziness and giddiness: Secondary | ICD-10-CM | POA: Insufficient documentation

## 2015-04-18 NOTE — Patient Instructions (Signed)
Medication Instructions:   Your physician recommends that you continue on your current medications as directed. Please refer to the Current Medication list given to you today.     Testing/Procedures:  Your physician has requested that you have a carotid duplex. This test is an ultrasound of the carotid arteries in your neck. It looks at blood flow through these arteries that supply the brain with blood. Allow one hour for this exam. There are no restrictions or special instructions.    Follow-Up:  Your physician wants you to follow-up in: 6 MONTHS WITH DR NELSON You will receive a reminder letter in the mail two months in advance. If you don't receive a letter, please call our office to schedule the follow-up appointment.        If you need a refill on your cardiac medications before your next appointment, please call your pharmacy.   

## 2015-04-18 NOTE — Progress Notes (Signed)
Patient ID: DIJANA PRATO, female   DOB: 1940/07/15, 74 y.o.   MRN: [id number]    Patient Name: [name] [name] Date of Encounter: 04/18/2015  Primary Care Provider:  [name] Primary Cardiologist:  Dorothy Spark (previously Dr Verl Blalock)  Problem List   Past Medical History  Diagnosis Date  . MVP (mitral valve prolapse)   . Brady-tachy syndrome ([place])   . HLD (hyperlipidemia)   . Palpitation   . Dyspepsia   . Vasovagal syncope   . Colitis, ischemic ([place])   . Fatty liver   . Osteopenia   . Diverticulosis of colon   . IBS (irritable bowel syndrome)   . Incontinence, feces   . Rectocele   . Hypertension   . Diverticulitis   . Rectocele   . nhl dx'd 07/1999    chemo comp 2001; rituxin comp 2005  . Colon polyp 07/29/1993    with focal adenomatous changes  . Chest pain 10/16/13    normal coronary arteries on cath and normal EF   Past Surgical History  Procedure Laterality Date  . Pelvic prolapse repair    . Abdominal hysterectomy    . Bladder suspension    . Bunionectomy    . Cholecystectomy  2003  . Upper gastrointestinal endoscopy  10/2005    hiatal hernia  . Colonoscopy  7/200/, 11/2007    diverticulosis  . Abdominal surgery    . Cardiac catheterization  10/16/13    done for abnormal nuc, no CAD, normal EF  . Left heart catheterization with coronary angiogram N/A 10/16/2013    Procedure: LEFT HEART CATHETERIZATION WITH CORONARY ANGIOGRAM;  Surgeon: Peter M Martinique, MD;  Location: [facility] CATH LAB;  Service: Cardiovascular;  Laterality: N/A;   Allergies  Allergies  Allergen Reactions  . Pamine Forte [Methscopolamine] Other (See Comments)    Unable to urinate  . Robinul [Glycopyrrolate] Other (See Comments)    Unable to urinate  . Sulfa Antibiotics Swelling    Face swelling   . Sulfonamide Derivatives Swelling    Face swelling   . Ace Inhibitors Hives  . Codeine Nausea Only  . Iodine Hives  . Iohexol Hives     Desc: 50 mg benadryl prior to exam     Chief complain: DOE, MVP  HPI  [name] [name] returns today for evaluation and management of DOE, and mitral valve prolapse.  She underwent a stress nuclear study in 2015 with abnormal stress portion, with ST depressions and chest pain during exertion as well as hypotension. There was no perfusion defect but transient ischemic dilatation and therefore patient was referred for cardiac catheterization that was normal. The patient saw Cecilie Kicks and underwent 2 week event monitoring that showed 2 episodes of nonsustained ventricular tachycardia which total beats of 11 and 14.  Since the last visit she denies any syncope or presyncope, occasional palpitations, no chest pain, stable DOE. She is requesting to have carotid US as there is a family h/o carotid disease.   Home Medications  Prior to Admission medications   Medication Sig Start Date End Date Taking? Authorizing Provider  aspirin EC 81 MG tablet Take 81 mg by mouth daily.   Yes Historical Provider, MD  atenolol (TENORMIN) 25 MG tablet TAKE ONE (1) TABLET EACH DAY   Yes Dorothy Spark, MD  calcium carbonate (OS-CAL) 600 MG TABS Take 600 mg by mouth 2 (two) times daily with a meal.     Yes Historical Provider, MD  esomeprazole ([place]) 20  MG capsule Take 20 mg by mouth daily before breakfast.   Yes Historical Provider, MD  fish oil-omega-3 fatty acids 1000 MG capsule Take 2 g by mouth daily.     Yes Historical Provider, MD  Multiple Vitamin (MULTIVITAMIN) tablet Take 1 tablet by mouth daily.     Yes Historical Provider, MD  Probiotic Product ([place]) CAPS Take 1 capsule by mouth daily. 07/24/12  Yes Gatha Mayer, MD    Family History  Family History  Problem Relation Age of Onset  . Colon cancer Father 26  . Prostate cancer Brother   . Colon polyps Brother   . Colon polyps Brother     Social History  Social History   Social History  . Marital Status: Married    Spouse Name: N/A  . Number of Children:  1  . Years of Education: N/A   Occupational History  . Clerical work part time   .     Social History Main Topics  . Smoking status: Former Smoker -- 0.33 packs/day    Quit date: 05/04/1967  . Smokeless tobacco: Never Used     Comment: quit 1976  . Alcohol Use: No  . Drug Use: No  . Sexual Activity: Yes    Birth Control/ Protection: Surgical   Other Topics Concern  . Not on file   Social History Narrative     Review of Systems, as per HPI, otherwise negative General:  No chills, fever, night sweats or weight changes.  Cardiovascular:  No chest pain, dyspnea on exertion, edema, orthopnea, palpitations, paroxysmal nocturnal dyspnea. Dermatological: No rash, lesions/masses Respiratory: No cough, dyspnea Urologic: No hematuria, dysuria Abdominal:   No nausea, vomiting, diarrhea, bright red blood per rectum, melena, or hematemesis Neurologic:  No visual changes, wkns, changes in mental status. All other systems reviewed and are otherwise negative except as noted above.  Physical Exam  Blood pressure 122/82, pulse 61, height 5\' 3"  (1.6 m), weight 142 lb (64.411 kg).  General: Pleasant, NAD Psych: Normal affect. Neuro: Alert and oriented X 3. Moves all extremities spontaneously. HEENT: Normal  Neck: Supple without bruits or JVD. Lungs:  Resp regular and unlabored, CTA. Heart: RRR no s3, s4, midsystolic click followed by a soft murmur.. Abdomen: Soft, non-tender, non-distended, BS + x 4.  Extremities: No clubbing, cyanosis or edema. DP/PT/Radials 2+ and equal bilaterally.  Labs:  No results for input(s): CKTOTAL, CKMB, TROPONINI in the last 72 hours. Lab Results  Component Value Date   WBC 7.3 03/07/2015   HGB 14.4 06/20/2014   HCT 40.9 03/07/2015   MCV 90.2 06/20/2014   PLT 249 06/20/2014    No results found for: DDIMER Invalid input(s): POCBNP    Component Value Date/Time   NA 142 03/07/2015 1521   NA 142 06/20/2014 0827   NA 143 10/12/2013 1305   K 4.3  03/07/2015 1521   K 4.2 06/20/2014 0827   CL 101 03/07/2015 1521   CL 105 01/20/2012 1021   CO2 25 03/07/2015 1521   CO2 28 06/20/2014 0827   GLUCOSE 93 03/07/2015 1521   GLUCOSE 97 06/20/2014 0827   GLUCOSE 90 10/12/2013 1305   GLUCOSE 91 01/20/2012 1021   BUN 15 03/07/2015 1521   BUN 15.5 06/20/2014 0827   BUN 18 10/12/2013 1305   CREATININE 0.76 03/07/2015 1521   CREATININE 0.8 06/20/2014 0827   CALCIUM 10.6* 03/07/2015 1521   CALCIUM 10.2 06/20/2014 0827   PROT 6.3 03/07/2015 1521   PROT  6.6 06/20/2014 0827   PROT 6.5 03/03/2011 1058   ALBUMIN 4.6 03/07/2015 1521   ALBUMIN 4.0 06/20/2014 0827   ALBUMIN 4.5 03/03/2011 1058   AST 20 03/07/2015 1521   AST 23 06/20/2014 0827   ALT 20 03/07/2015 1521   ALT 20 06/20/2014 0827   ALKPHOS 70 03/07/2015 1521   ALKPHOS 68 06/20/2014 0827   BILITOT <0.2 03/07/2015 1521   BILITOT 0.37 06/20/2014 0827   BILITOT 0.3 03/03/2011 1058   GFRNONAA 78 03/07/2015 1521   GFRAA 89 03/07/2015 1521   Lab Results  Component Value Date   CHOL 217* 02/06/2013   HDL 47 02/06/2013   LDLCALC 137* 02/06/2013   TRIG 166* 02/06/2013    Accessory Clinical Findings  Echocardiogram - 09/12/2013 Left ventricle: The cavity size was normal. Wall thickness was normal. Systolic function was normal. The estimated ejection fraction was 55%. Wall motion was normal; there were no regional wall motion abnormalities. Aortic valve: Trileaflet. Doppler: There was no stenosis. No regurgitation. Aorta: Aortic root normal size. Mitral valve: Minimal prolapse of the posterior leaflet. Doppler: There was no evidence for stenosis. Mild regurgitation. Peak gradient: 18mm Hg (D). Left atrium: The atrium was normal in size. Right ventricle: The cavity size was normal. Systolic function was normal. Pulmonic valve: The valve appears to be grossly normal. Tricuspid valve: Doppler: Mild regurgitation. Right atrium: The atrium was normal in size. Pericardium: The  pericardium was normal in appearance. Systemic veins: Inferior vena cava: The vessel was normal in size; the respirophasic diameter changes were in the normal range (= 50%); findings are consistent with normal central venous pressure. Post procedure conclusions Ascending Aorta:  - Aortic root normal size.  Cardiac MRI: 12/2013 IMPRESSION: 1. Normal left ventricular size, thickness and systolic function (LVEF = 56%). No late gadolinium enhancement seen in the left ventricular myocardium.  2. Normal right ventricular size, thickness and systolic function (RVEF = 56%). No regional wall motion abnormalities. No abnormal fat or LGE seen in the right ventricular myocardium. Conclusively, there is no evidence for arrhythmogenic right ventricular dysplasia.  3. Prolapse of the anterior mitral valve leaflet associated with mild mitral regurgitation.  4. Mild tricuspid regurgitation.    Assessment & Plan  74 year old female  1. dyspnea on exertion is progressively worsening, abnormal EKG portion of the stress test with normal cardiac catheterization. Event monitor showed 2 episodes of nonsustained ventricular tachycardia lasting 14 and 11 cycles.Cardiac MRI was showed normal biventricular function and ruled out infiltrative or inflammatory cardiomyopathy.  2. nsVTs - evaluated by EP - "I have discussed the benign nature of her symptoms. As they are currently minimally symptomatic, I have recommended she take her beta blocker and undergo watchful waiting. I would be glad to see her back as needed for worsening symptoms. Flecainide would be another medical option in conjunction with her beta blocker if she has bothersome symptoms."  3. Hypertension - controlled  4. Mitral prolapse - mild associated with mild MR only.   5. Lipids - followed by primary care physician.  Followup after cardiac MRI.    Dorothy Spark, MD, [facility] 04/18/2015, 11:16 AM

## 2015-04-21 ENCOUNTER — Ambulatory Visit (HOSPITAL_COMMUNITY)
Admission: RE | Admit: 2015-04-21 | Discharge: 2015-04-21 | Disposition: A | Discharge status: Home / Self Care | Payer: Medicare Other | Source: Ambulatory Visit | Attending: Internal Medicine | Admitting: Internal Medicine

## 2015-04-21 DIAGNOSIS — E785 Hyperlipidemia, unspecified: Secondary | ICD-10-CM | POA: Insufficient documentation

## 2015-04-21 DIAGNOSIS — R42 Dizziness and giddiness: Secondary | ICD-10-CM | POA: Insufficient documentation

## 2015-04-21 DIAGNOSIS — I6523 Occlusion and stenosis of bilateral carotid arteries: Secondary | ICD-10-CM | POA: Insufficient documentation

## 2015-04-21 DIAGNOSIS — I10 Essential (primary) hypertension: Secondary | ICD-10-CM | POA: Diagnosis not present

## 2015-04-23 ENCOUNTER — Telehealth: Payer: Self-pay | Admitting: Internal Medicine

## 2015-04-23 NOTE — Telephone Encounter (Signed)
New Message   Pt calling for RN to call back she said she missed a call and didn't get a message or a name

## 2015-04-23 NOTE — Telephone Encounter (Signed)
Notified the pt that per Dr Meda Coffee she had a completely normal carotid US bilaterally.  Pt verbalized understanding and gracious for this good news.

## 2015-05-01 ENCOUNTER — Telehealth: Payer: Self-pay | Admitting: *Deleted

## 2015-05-01 NOTE — Telephone Encounter (Signed)
VM message from patient in response to call she received from cancer center. TC back to patient. No note in pt's chart regarding phone call. Reviewed appts she has with Dr. Julien Nordmann and also appts her husband has with Dr. Benay Spice. No other issues noted.

## 2015-05-21 ENCOUNTER — Encounter: Payer: Self-pay | Admitting: Nurse Practitioner

## 2015-05-21 ENCOUNTER — Ambulatory Visit (INDEPENDENT_AMBULATORY_CARE_PROVIDER_SITE_OTHER): Payer: Medicare Other | Admitting: Nurse Practitioner

## 2015-05-21 VITALS — BP 124/80 | Temp 98.3°F | Ht 63.0 in | Wt 144.2 lb

## 2015-05-21 DIAGNOSIS — L568 Other specified acute skin changes due to ultraviolet radiation: Secondary | ICD-10-CM | POA: Diagnosis not present

## 2015-05-21 DIAGNOSIS — R51 Headache: Secondary | ICD-10-CM | POA: Diagnosis not present

## 2015-05-21 DIAGNOSIS — R519 Headache, unspecified: Secondary | ICD-10-CM

## 2015-05-21 MED ORDER — ALPRAZOLAM 0.25 MG PO TABS: 0.2500 mg | ORAL_TABLET | Freq: Every day | ORAL | Status: DC | PRN

## 2015-05-21 NOTE — Progress Notes (Signed)
Subjective:  Presents for complaints of a localized pain in the left forehead into the mid scalp area that began about 2 months ago. Describes as a brief sharp "flashing" pain. No soreness or pain with palpation of this area. Does not wake her up at nighttime. Occurs most days of the week. Can occur several times per day. Has not identified any specific triggers. Always occurs in the same exact spot. No history of zoster. No associated symptoms such as visual changes, nausea vomiting, dizziness, numbness or weakness of the face arms or legs. No throbbing or pounding pain. Has had extensive ophthalmology workup, had injection in her left eye of Jetrea. Has developed chronic dry eyes which requires Restasis. Has a long-term sensitivity to light, no changes. Also has situations where it seems is difficult to open her eyes particularly the left eye. No numbness or weakness. Has had some slight changes in vision of her left eye, her specialist says her vision is now 20/20 in both eyes.  Objective:   BP 124/80 mmHg  Temp(Src) 98.3 F (36.8 C) (Oral)  Ht 5\' 3"  (1.6 m)  Wt 144 lb 4 oz (65.431 kg)  BMI 25.56 kg/m2 NAD. Alert, oriented. Lungs clear. Heart regular rate rhythm. EOMs intact without nystagmus. Symmetrical movement of the face. No tenderness or rash noted in the left mid forehead area into the left anterior scalp area.  Assessment: Scalp pain  Photosensitivity  Plan:  Meds ordered this encounter  Medications  . ALPRAZolam (XANAX) 0.25 MG tablet    Sig: Take 1 tablet (0.25 mg total) by mouth daily as needed for anxiety.    Dispense:  30 tablet    Refill:  2    Order Specific Question:  Supervising Provider    Answer:  Maggie Font   The description patient has of difficulty opening her eyes is most likely related to either drives or sensitivity to light. This could be result of her previous injection. Continue follow-up with ophthalmologist. There are no complaints of neurologic  symptoms at this point that needs further follow-up. Warning signs reviewed. Patient to call back if symptoms worsen or if new symptoms develop. At end of visit patient request refill on her Xanax that she uses very rarely for anxiety.

## 2015-05-23 DIAGNOSIS — H16223 Keratoconjunctivitis sicca, not specified as Sjogren's, bilateral: Secondary | ICD-10-CM | POA: Diagnosis not present

## 2015-05-28 DIAGNOSIS — B351 Tinea unguium: Secondary | ICD-10-CM | POA: Diagnosis not present

## 2015-05-28 DIAGNOSIS — L600 Ingrowing nail: Secondary | ICD-10-CM | POA: Diagnosis not present

## 2015-05-28 DIAGNOSIS — M2042 Other hammer toe(s) (acquired), left foot: Secondary | ICD-10-CM | POA: Diagnosis not present

## 2015-05-28 DIAGNOSIS — M79671 Pain in right foot: Secondary | ICD-10-CM | POA: Diagnosis not present

## 2015-06-24 ENCOUNTER — Ambulatory Visit (HOSPITAL_BASED_OUTPATIENT_CLINIC_OR_DEPARTMENT_OTHER): Payer: Medicare Other | Admitting: Internal Medicine

## 2015-06-24 ENCOUNTER — Other Ambulatory Visit (HOSPITAL_BASED_OUTPATIENT_CLINIC_OR_DEPARTMENT_OTHER): Payer: Medicare Other

## 2015-06-24 ENCOUNTER — Encounter: Payer: Self-pay | Admitting: Internal Medicine

## 2015-06-24 VITALS — BP 125/67 | HR 66 | Temp 98.5°F | Resp 18 | Ht 63.0 in | Wt 144.0 lb

## 2015-06-24 DIAGNOSIS — Z8579 Personal history of other malignant neoplasms of lymphoid, hematopoietic and related tissues: Secondary | ICD-10-CM

## 2015-06-24 DIAGNOSIS — C8203 Follicular lymphoma grade I, intra-abdominal lymph nodes: Secondary | ICD-10-CM

## 2015-06-24 DIAGNOSIS — C8590 Non-Hodgkin lymphoma, unspecified, unspecified site: Secondary | ICD-10-CM

## 2015-06-24 LAB — CBC WITH DIFFERENTIAL/PLATELET
BASO%: 0.8 % (ref 0.0–2.0)
Basophils Absolute: 0.1 10*3/uL (ref 0.0–0.1)
EOS ABS: 0.2 10*3/uL (ref 0.0–0.5)
EOS%: 2.2 % (ref 0.0–7.0)
HCT: 42.8 % (ref 34.8–46.6)
HGB: 14.3 g/dL (ref 11.6–15.9)
LYMPH%: 24.9 % (ref 14.0–49.7)
MCH: 30 pg (ref 25.1–34.0)
MCHC: 33.4 g/dL (ref 31.5–36.0)
MCV: 89.6 fL (ref 79.5–101.0)
MONO#: 0.8 10*3/uL (ref 0.1–0.9)
MONO%: 11.7 % (ref 0.0–14.0)
NEUT%: 60.4 % (ref 38.4–76.8)
NEUTROS ABS: 4.1 10*3/uL (ref 1.5–6.5)
Platelets: 228 10*3/uL (ref 145–400)
RBC: 4.78 10*6/uL (ref 3.70–5.45)
RDW: 13.6 % (ref 11.2–14.5)
WBC: 6.7 10*3/uL (ref 3.9–10.3)
lymph#: 1.7 10*3/uL (ref 0.9–3.3)

## 2015-06-24 LAB — COMPREHENSIVE METABOLIC PANEL
ALT: 27 U/L (ref 0–55)
AST: 25 U/L (ref 5–34)
Albumin: 3.8 g/dL (ref 3.5–5.0)
Alkaline Phosphatase: 78 U/L (ref 40–150)
Anion Gap: 8 mEq/L (ref 3–11)
BILIRUBIN TOTAL: 0.34 mg/dL (ref 0.20–1.20)
BUN: 11.9 mg/dL (ref 7.0–26.0)
CHLORIDE: 108 meq/L (ref 98–109)
CO2: 27 meq/L (ref 22–29)
CREATININE: 0.8 mg/dL (ref 0.6–1.1)
Calcium: 10.1 mg/dL (ref 8.4–10.4)
EGFR: 76 mL/min/{1.73_m2} — ABNORMAL LOW (ref >90)
GLUCOSE: 95 mg/dL (ref 70–140)
Potassium: 4.3 mEq/L (ref 3.5–5.1)
SODIUM: 143 meq/L (ref 136–145)
TOTAL PROTEIN: 6.8 g/dL (ref 6.4–8.3)

## 2015-06-24 LAB — LACTATE DEHYDROGENASE: LDH: 188 U/L (ref 125–245)

## 2015-06-24 NOTE — Progress Notes (Signed)
[facility] [phone]   [phone]  OFFICE PROGRESS NOTE  [name] [address] [place], [place] [address]  Principle Diagnosis: Non-Hodgkin's lymphoma, low-grade lymphoma initially diagnosed in March of 2001 with evidence for progressive disease noted in February of 2004   Prior Therapy: Status post initial chemotherapy with mitoxantrone, fludarabine, and Rituxan completed January 2002. After the patient was found to have progressive disease in February 2004. She was treated with single agent Rituxan given February 200 4 through August 2005  Current therapy: Observation  INTERVAL HISTORY: [name] [name] 75 y.o. female returns to the clinic today for annual follow-up visit. The patient is feeling fine today with no specific complaints except for occasional subcutaneous nodules in the upper extremities. The patient denied having any significant weight loss but has occasional night sweats. She denied having any chest pain, shortness breath, cough or hemoptysis. She has no nausea or vomiting. She had repeat CBC, comprehensive metabolic panel and LDH performed earlier today and she is here for evaluation and discussion of her lab results.  MEDICAL HISTORY: Past Medical History  Diagnosis Date  . MVP (mitral valve prolapse)   . Brady-tachy syndrome ([place])   . HLD (hyperlipidemia)   . Palpitation   . Dyspepsia   . Vasovagal syncope   . Colitis, ischemic ([place])   . Fatty liver   . Osteopenia   . Diverticulosis of colon   . IBS (irritable bowel syndrome)   . Incontinence, feces   . Rectocele   . Hypertension   . Diverticulitis   . Rectocele   . nhl dx'd 07/1999    chemo comp 2001; rituxin comp 2005  . Colon polyp 07/29/1993    with focal adenomatous changes  . Chest pain 10/16/13    normal coronary arteries on cath and normal EF    ALLERGIES:  is allergic to pamine forte; robinul; sulfa antibiotics; sulfonamide derivatives;  ace inhibitors; codeine; iodine; and iohexol.  MEDICATIONS:  Current Outpatient Prescriptions  Medication Sig Dispense Refill  . aspirin EC 81 MG tablet Take 81 mg by mouth daily.    Marland Kitchen atenolol (TENORMIN) 25 MG tablet TAKE ONE (1) TABLET EACH DAY 90 tablet 3  . clobetasol ointment (TEMOVATE) 0.05 %     . esomeprazole (NEXIUM) 20 MG capsule Take 20 mg by mouth daily at 12 noon.    . fish oil-omega-3 fatty acids 1000 MG capsule Take 1 g by mouth daily.     . Multiple Vitamin (MULTIVITAMIN) tablet Take 1 tablet by mouth daily.      . Polyvinyl Alcohol-Povidone (REFRESH OP) Apply 1 drop to eye 3 (three) times daily.    . Probiotic Product ([place]) CAPS Take 1 capsule by mouth daily.    . RESTASIS 0.05 % ophthalmic emulsion     . Zoledronic Acid (RECLAST IV) Inject into the vein. Takes once a year    . ALPRAZolam (XANAX) 0.25 MG tablet Take 1 tablet (0.25 mg total) by mouth daily as needed for anxiety. (Patient not taking: Reported on 06/24/2015) 30 tablet 2  . fexofenadine (ALLEGRA) 180 MG tablet Take 180 mg by mouth daily. Reported on 06/24/2015     No current facility-administered medications for this visit.    SURGICAL HISTORY:  Past Surgical History  Procedure Laterality Date  . Pelvic prolapse repair    . Abdominal hysterectomy    . Bladder suspension    . Bunionectomy    .  Cholecystectomy  2003  . Upper gastrointestinal endoscopy  10/2005    hiatal hernia  . Colonoscopy  7/200/, 11/2007    diverticulosis  . Abdominal surgery    . Cardiac catheterization  10/16/13    done for abnormal nuc, no CAD, normal EF  . Left heart catheterization with coronary angiogram N/A 10/16/2013    Procedure: LEFT HEART CATHETERIZATION WITH CORONARY ANGIOGRAM;  Surgeon: Peter M Martinique, MD;  Location: [facility] CATH LAB;  Service: Cardiovascular;  Laterality: N/A;    REVIEW OF SYSTEMS:  A comprehensive review of systems was negative.   PHYSICAL EXAMINATION: General appearance: alert,  cooperative and no distress Head: Normocephalic, without obvious abnormality, atraumatic Neck: no adenopathy, no JVD, supple, symmetrical, trachea midline and thyroid not enlarged, symmetric, no tenderness/mass/nodules Lymph nodes: Cervical, supraclavicular, and axillary nodes normal. Resp: clear to auscultation bilaterally Back: symmetric, no curvature. ROM normal. No CVA tenderness. Cardio: regular rate and rhythm, S1, S2 normal, no murmur, click, rub or gallop GI: soft, non-tender; bowel sounds normal; no masses,  no organomegaly Extremities: extremities normal, atraumatic, no cyanosis or edema  ECOG PERFORMANCE STATUS: 1 - Symptomatic but completely ambulatory  Blood pressure 125/67, pulse 66, temperature 98.5 F (36.9 C), temperature source Oral, resp. rate 18, height 5\' 3"  (1.6 m), weight 144 lb (65.318 kg), SpO2 100 %.  LABORATORY DATA: Lab Results  Component Value Date   WBC 6.7 06/24/2015   HGB 14.3 06/24/2015   HCT 42.8 06/24/2015   MCV 89.6 06/24/2015   PLT 228 06/24/2015      Chemistry      Component Value Date/Time   NA 143 06/24/2015 0852   NA 142 03/07/2015 1521   NA 143 10/12/2013 1305   K 4.3 06/24/2015 0852   K 4.3 03/07/2015 1521   CL 101 03/07/2015 1521   CL 105 01/20/2012 1021   CO2 27 06/24/2015 0852   CO2 25 03/07/2015 1521   BUN 11.9 06/24/2015 0852   BUN 15 03/07/2015 1521   BUN 18 10/12/2013 1305   CREATININE 0.8 06/24/2015 0852   CREATININE 0.76 03/07/2015 1521      Component Value Date/Time   CALCIUM 10.1 06/24/2015 0852   CALCIUM 10.6* 03/07/2015 1521   ALKPHOS 78 06/24/2015 0852   ALKPHOS 70 03/07/2015 1521   AST 25 06/24/2015 0852   AST 20 03/07/2015 1521   ALT 27 06/24/2015 0852   ALT 20 03/07/2015 1521   BILITOT 0.34 06/24/2015 0852   BILITOT <0.2 03/07/2015 1521   BILITOT 0.3 03/03/2011 1058       RADIOGRAPHIC STUDIES: No results found.  ASSESSMENT AND PLAN: This is a very pleasant 75 years old white female with history  of non-Hodgkin lymphoma, low-grade lymphoma diagnosed initially in March 2001 with disease progression 2004 status post treatment and has been observation since August 2005 was no evidence for disease recurrence. The patient is doing fine today and no evidence for concerning finding on her blood work. I discussed the lab result with the patient and her husband today. I recommended for her to continue on observation with routine follow-up visit by her primary care physician at this point. I will be happy to see her in the future if needed for any concerning findings. The patient was advised to call if she has any concerning symptoms. The patient voices understanding of current disease status and treatment options and is in agreement with the current care plan.  All questions were answered. The patient knows to call the clinic  with any problems, questions or concerns. We can certainly see the patient much sooner if necessary.  Disclaimer: This note was dictated with voice recognition software. Similar sounding words can inadvertently be transcribed and may not be corrected upon review.       

## 2015-06-25 ENCOUNTER — Ambulatory Visit: Payer: Medicare Other | Admitting: Nurse Practitioner

## 2015-06-26 DIAGNOSIS — M7541 Impingement syndrome of right shoulder: Secondary | ICD-10-CM | POA: Diagnosis not present

## 2015-06-26 DIAGNOSIS — M25511 Pain in right shoulder: Secondary | ICD-10-CM | POA: Diagnosis not present

## 2015-06-30 ENCOUNTER — Encounter: Payer: Self-pay | Admitting: Family Medicine

## 2015-06-30 ENCOUNTER — Ambulatory Visit (INDEPENDENT_AMBULATORY_CARE_PROVIDER_SITE_OTHER): Payer: Medicare Other | Admitting: Family Medicine

## 2015-06-30 VITALS — BP 132/76 | Ht 63.5 in | Wt 140.0 lb

## 2015-06-30 DIAGNOSIS — R002 Palpitations: Secondary | ICD-10-CM | POA: Diagnosis not present

## 2015-06-30 DIAGNOSIS — D1720 Benign lipomatous neoplasm of skin and subcutaneous tissue of unspecified limb: Secondary | ICD-10-CM | POA: Diagnosis not present

## 2015-06-30 DIAGNOSIS — M25562 Pain in left knee: Secondary | ICD-10-CM | POA: Diagnosis not present

## 2015-06-30 NOTE — Progress Notes (Signed)
   Subjective:    Patient ID: [name] [name], female    DOB: 1941-01-16, 75 y.o.   MRN: [id number]  Knee Pain  Incident onset: 6 weeks ago. The pain is present in the left knee. She has tried nothing for the symptoms.  started feb 14 Low grade pain-medial knee,on the side No injury No locking not giving way No swelling Tried no meds   Having heart palpatations. Started at same time as the knee pain. Has gotten better as the knee pain has gotten better. Has appt with cardio this week on Wednesday. Seeing Kerin Ransom PA   Check spots on right arm. These areas have been present to her for at least 6 months she is not certain if there could be any trigger. She showed these to her oncologist who told her that they appeared to be fat growths Patient under some stress with her husband have reoccurrence of cancer patient denies caffeine use. Tries to eat relatively healthy.  Review of Systems    see above Objective:   Physical Exam The area where she complained of knee pain is not tender. Ligaments are strong. No crepitus. The right knee is also normal. There is no swelling. Lipoma near the biceps is noted on the right arm approximately 1-1/2 cm in diameter soft, freely movable smaller lipoma noted in the distal forearm Lungs are clear hearts regular       Assessment & Plan:  Lipomas-I recommend recheck that again in approximately 4-6 months sooner if any problems patient was alerted that if this area becomes bigger or greater she would need to notify us  Palpitations she will be seeing cardiology later this week use metoprolol as directed  Knee pain-resolved. I recommend compresses should it recur along with Aleve for 5-7 days if ongoing troubles follow-up

## 2015-07-02 ENCOUNTER — Encounter: Payer: Self-pay | Admitting: Cardiology

## 2015-07-02 ENCOUNTER — Ambulatory Visit (INDEPENDENT_AMBULATORY_CARE_PROVIDER_SITE_OTHER): Payer: Medicare Other | Admitting: Cardiology

## 2015-07-02 VITALS — BP 128/70 | HR 68 | Ht 63.5 in | Wt 141.8 lb

## 2015-07-02 DIAGNOSIS — R002 Palpitations: Secondary | ICD-10-CM | POA: Diagnosis not present

## 2015-07-02 DIAGNOSIS — I472 Ventricular tachycardia: Secondary | ICD-10-CM | POA: Diagnosis not present

## 2015-07-02 DIAGNOSIS — I4729 Other ventricular tachycardia: Secondary | ICD-10-CM

## 2015-07-02 DIAGNOSIS — I341 Nonrheumatic mitral (valve) prolapse: Secondary | ICD-10-CM | POA: Diagnosis not present

## 2015-07-02 NOTE — Progress Notes (Signed)
07/02/2015 [name] [name]   [birth year]  [id number]  Primary Physician [name] Primary Cardiologist: Dr Meda Coffee  HPI:  Pt seen in the office today with complaints of palpitations last week. She says her heart was "jumping out of my chest". Her symptoms were worse at night when laying flat. She actually slept in a recliner for a couple nights. It hard for her to tell if she was having irregular beats vs no sustained tachycardia. She denies any symptoms during the day. She has been under a some stress- her husband apparently has recurrent gall bladder cancer.    Current Outpatient Prescriptions  Medication Sig Dispense Refill  . ALPRAZolam (XANAX) 0.25 MG tablet Take 1 tablet (0.25 mg total) by mouth daily as needed for anxiety. 30 tablet 2  . aspirin EC 81 MG tablet Take 81 mg by mouth daily.    Marland Kitchen atenolol (TENORMIN) 25 MG tablet TAKE ONE (1) TABLET EACH DAY 90 tablet 3  . clobetasol ointment (TEMOVATE) AB-123456789 % Apply 1 application topically daily as needed (dermatitis).     Marland Kitchen esomeprazole (NEXIUM) 20 MG capsule Take 20 mg by mouth daily at 12 noon.    . fexofenadine (ALLEGRA) 180 MG tablet Take 180 mg by mouth daily. Reported on 06/24/2015    . fish oil-omega-3 fatty acids 1000 MG capsule Take 1 g by mouth daily.     . Multiple Vitamin (MULTIVITAMIN) tablet Take 1 tablet by mouth daily.      . Polyvinyl Alcohol-Povidone (REFRESH OP) Apply 1 drop to eye 3 (three) times daily.    . Probiotic Product (Marty) CAPS Take 1 capsule by mouth daily.    . RESTASIS 0.05 % ophthalmic emulsion Place 1 drop into both eyes 2 (two) times daily.     . Zoledronic Acid (RECLAST IV) Inject into the vein. Takes once a year     No current facility-administered medications for this visit.    Allergies  Allergen Reactions  . Pamine Forte [Methscopolamine] Other (See Comments)    Unable to urinate  . Robinul [Glycopyrrolate] Other (See Comments)    Unable to urinate  . Sulfa  Antibiotics Swelling    Face swelling   . Sulfonamide Derivatives Swelling    Face swelling   . Ace Inhibitors Hives  . Codeine Nausea Only  . Iodine Hives  . Iohexol Hives     Desc: 50 mg benadryl prior to exam     Social History   Social History  . Marital Status: Married    Spouse Name: N/A  . Number of Children: 1  . Years of Education: N/A   Occupational History  . Clerical work part time   .     Social History Main Topics  . Smoking status: Former Smoker -- 0.33 packs/day    Quit date: 05/04/1967  . Smokeless tobacco: Never Used     Comment: quit 1976  . Alcohol Use: No  . Drug Use: No  . Sexual Activity: Yes    Birth Control/ Protection: Surgical   Other Topics Concern  . Not on file   Social History Narrative     Review of Systems: General: negative for chills, fever, night sweats or weight changes.  Cardiovascular: negative for chest pain, dyspnea on exertion, edema, orthopnea, palpitations, paroxysmal nocturnal dyspnea or shortness of breath Dermatological: negative for rash Respiratory: negative for cough or wheezing Urologic: negative for hematuria Abdominal: negative for nausea, vomiting, diarrhea, bright red blood per rectum, melena,  or hematemesis Neurologic: negative for visual changes, syncope, or dizziness All other systems reviewed and are otherwise negative except as noted above.    Blood pressure 128/70, pulse 68, height 5' 3.5" (1.613 m), weight 141 lb 12.8 oz (64.32 kg).  General appearance: alert, cooperative, appears stated age and no distress Neck: no carotid bruit and no JVD Lungs: clear to auscultation bilaterally Heart: regular rate and rhythm Extremities: extremities normal, atraumatic, no cyanosis or edema Skin: Skin color, texture, turgor normal. No rashes or lesions Neurologic: Grossly normal  EKG [id number]  ASSESSMENT AND PLAN:   Heart palpitations Pt had palpitations and tachycardia last week- worse at night, worse  when flat in bed  MVP (mitral valve prolapse) Due for a f/u echo  Ocular hypertension Documented on past Holter  NSVT (nonsustained ventricular tachycardia) Documented on past Holter  NHL (non-Hodgkin's lymphoma) Followed at [facility]- stable   PLAN  I suggested we get a 48 hr Holter. The treatment for PACs and palpitations would be different that the treatment of NSVT. I suggested she could take an extra 1/2 Tenormin HS for symptoms if needed (but not to start till after her Holter). She says she is due for an echo in June and asked if we could go ahead with this now.   Tadeusz Stahl K PA-C 07/02/2015 11:08 AM

## 2015-07-02 NOTE — Patient Instructions (Signed)
Medication Instructions:  Your physician recommends that you continue on your current medications as directed. Please refer to the Current Medication list given to you today.  Labwork: None ordered  Testing/Procedures: Your physician has requested that you have an echocardiogram. Echocardiography is a painless test that uses sound waves to create images of your heart. It provides your doctor with information about the size and shape of your heart and how well your heart's chambers and valves are working. This procedure takes approximately one hour. There are no restrictions for this procedure.  Your physician has recommended that you wear a holter monitor. Holter monitors are medical devices that record the heart's electrical activity. Doctors most often use these monitors to diagnose arrhythmias. Arrhythmias are problems with the speed or rhythm of the heartbeat. The monitor is a small, portable device. You can wear one while you do your normal daily activities. This is usually used to diagnose what is causing palpitations/syncope (passing out).  Follow-Up: Keep currently planned follow up in June with Dr. Meda Coffee.  If you need a refill on your cardiac medications before your next appointment, please call your pharmacy.  Thank you for choosing [facility]!!   Trinidad Curet, RN [phone]    Any Other Special Instructions Will Be Listed Below (If Applicable). Echocardiogram An echocardiogram, or echocardiography, uses sound waves (ultrasound) to produce an image of your heart. The echocardiogram is simple, painless, obtained within a short period of time, and offers valuable information to your health care provider. The images from an echocardiogram can provide information such as:  Evidence of coronary artery disease (CAD).  Heart size.  Heart muscle function.  Heart valve function.  Aneurysm detection.  Evidence of a past heart attack.  Fluid buildup around the  heart.  Heart muscle thickening.  Assess heart valve function. LET [facility] CARE PROVIDER KNOW ABOUT:  Any allergies you have.  All medicines you are taking, including vitamins, herbs, eye drops, creams, and over-the-counter medicines.  Previous problems you or members of your family have had with the use of anesthetics.  Any blood disorders you have.  Previous surgeries you have had.  Medical conditions you have.  Possibility of pregnancy, if this applies. BEFORE THE PROCEDURE  No special preparation is needed. Eat and drink normally.  PROCEDURE   In order to produce an image of your heart, gel will be applied to your chest and a wand-like tool (transducer) will be moved over your chest. The gel will help transmit the sound waves from the transducer. The sound waves will harmlessly bounce off your heart to allow the heart images to be captured in real-time motion. These images will then be recorded.  You may need an IV to receive a medicine that improves the quality of the pictures. AFTER THE PROCEDURE You may return to your normal schedule including diet, activities, and medicines, unless your health care provider tells you otherwise.   This information is not intended to replace advice given to you by your health care provider. Make sure you discuss any questions you have with your health care provider.   Document Released: 04/16/2000 Document Revised: 05/10/2014 Document Reviewed: 12/25/2012 Elsevier Interactive Patient Education 2016 Elsevier Inc.   Holter Monitoring A Holter monitor is a small device that is used to detect abnormal heart rhythms. It clips to your clothing and is connected by wires to flat, sticky disks (electrodes) that attach to your chest. It is worn continuously for 24-48 hours. HOME CARE INSTRUCTIONS  Wear your Holter monitor at all times, even while exercising and sleeping, for as long as directed by your health care provider.  Make sure that  the Holter monitor is safely clipped to your clothing or close to your body as recommended by your health care provider.  Do not get the monitor or wires wet.  Do not put body lotion or moisturizer on your chest.  Keep your skin clean.  Keep a diary of your daily activities, such as walking and doing chores. If you feel that your heartbeat is abnormal or that your heart is fluttering or skipping a beat:  Record what you are doing when it happens.  Record what time of day the symptoms occur.  Return your Holter monitor as directed by your health care provider.  Keep all follow-up visits as directed by your health care provider. This is important. SEEK IMMEDIATE MEDICAL CARE IF:  You feel lightheaded or you faint.  You have trouble breathing.  You feel pain in your chest, upper arm, or jaw.  You feel sick to your stomach and your skin is pale, cool, or damp.  You heartbeat feels unusual or abnormal.   This information is not intended to replace advice given to you by your health care provider. Make sure you discuss any questions you have with your health care provider.   Document Released: 01/16/2004 Document Revised: 05/10/2014 Document Reviewed: 11/26/2013 Elsevier Interactive Patient Education [employer].

## 2015-07-02 NOTE — Assessment & Plan Note (Signed)
Pt had palpitations and tachycardia last week- worse at night, worse when flat in bed

## 2015-07-02 NOTE — Assessment & Plan Note (Signed)
Documented on past Holter

## 2015-07-02 NOTE — Assessment & Plan Note (Signed)
Due for a f/u echo

## 2015-07-02 NOTE — Assessment & Plan Note (Signed)
Followed at [facility]- stable

## 2015-07-07 ENCOUNTER — Ambulatory Visit (INDEPENDENT_AMBULATORY_CARE_PROVIDER_SITE_OTHER): Payer: Medicare Other

## 2015-07-07 DIAGNOSIS — R002 Palpitations: Secondary | ICD-10-CM | POA: Diagnosis not present

## 2015-07-16 ENCOUNTER — Other Ambulatory Visit: Payer: Self-pay

## 2015-07-16 ENCOUNTER — Ambulatory Visit (HOSPITAL_COMMUNITY): Payer: Medicare Other | Attending: Cardiology

## 2015-07-16 DIAGNOSIS — I340 Nonrheumatic mitral (valve) insufficiency: Secondary | ICD-10-CM | POA: Diagnosis not present

## 2015-07-16 DIAGNOSIS — I472 Ventricular tachycardia: Secondary | ICD-10-CM | POA: Insufficient documentation

## 2015-07-16 DIAGNOSIS — E785 Hyperlipidemia, unspecified: Secondary | ICD-10-CM | POA: Diagnosis not present

## 2015-07-16 DIAGNOSIS — I10 Essential (primary) hypertension: Secondary | ICD-10-CM | POA: Insufficient documentation

## 2015-07-16 DIAGNOSIS — Z87891 Personal history of nicotine dependence: Secondary | ICD-10-CM | POA: Diagnosis not present

## 2015-07-16 DIAGNOSIS — R002 Palpitations: Secondary | ICD-10-CM | POA: Insufficient documentation

## 2015-07-26 ENCOUNTER — Encounter (HOSPITAL_COMMUNITY): Payer: Self-pay | Admitting: Emergency Medicine

## 2015-07-26 ENCOUNTER — Emergency Department (HOSPITAL_COMMUNITY): Payer: Medicare Other

## 2015-07-26 ENCOUNTER — Emergency Department (HOSPITAL_COMMUNITY)
Admission: EM | Admit: 2015-07-26 | Discharge: 2015-07-26 | Disposition: A | Discharge status: Home / Self Care | Payer: Medicare Other | Attending: Emergency Medicine | Admitting: Emergency Medicine

## 2015-07-26 DIAGNOSIS — Y9389 Activity, other specified: Secondary | ICD-10-CM | POA: Diagnosis not present

## 2015-07-26 DIAGNOSIS — I10 Essential (primary) hypertension: Secondary | ICD-10-CM | POA: Insufficient documentation

## 2015-07-26 DIAGNOSIS — Z87448 Personal history of other diseases of urinary system: Secondary | ICD-10-CM | POA: Insufficient documentation

## 2015-07-26 DIAGNOSIS — Z8719 Personal history of other diseases of the digestive system: Secondary | ICD-10-CM | POA: Insufficient documentation

## 2015-07-26 DIAGNOSIS — Y92093 Driveway of other non-institutional residence as the place of occurrence of the external cause: Secondary | ICD-10-CM | POA: Insufficient documentation

## 2015-07-26 DIAGNOSIS — Z79899 Other long term (current) drug therapy: Secondary | ICD-10-CM | POA: Diagnosis not present

## 2015-07-26 DIAGNOSIS — Z8739 Personal history of other diseases of the musculoskeletal system and connective tissue: Secondary | ICD-10-CM | POA: Insufficient documentation

## 2015-07-26 DIAGNOSIS — S52132A Displaced fracture of neck of left radius, initial encounter for closed fracture: Secondary | ICD-10-CM

## 2015-07-26 DIAGNOSIS — W1839XA Other fall on same level, initial encounter: Secondary | ICD-10-CM | POA: Diagnosis not present

## 2015-07-26 DIAGNOSIS — S52135A Nondisplaced fracture of neck of left radius, initial encounter for closed fracture: Secondary | ICD-10-CM | POA: Insufficient documentation

## 2015-07-26 DIAGNOSIS — Z9889 Other specified postprocedural states: Secondary | ICD-10-CM | POA: Diagnosis not present

## 2015-07-26 DIAGNOSIS — E785 Hyperlipidemia, unspecified: Secondary | ICD-10-CM | POA: Insufficient documentation

## 2015-07-26 DIAGNOSIS — S59902A Unspecified injury of left elbow, initial encounter: Secondary | ICD-10-CM | POA: Diagnosis not present

## 2015-07-26 DIAGNOSIS — S59912A Unspecified injury of left forearm, initial encounter: Secondary | ICD-10-CM | POA: Diagnosis present

## 2015-07-26 DIAGNOSIS — M79632 Pain in left forearm: Secondary | ICD-10-CM | POA: Diagnosis not present

## 2015-07-26 DIAGNOSIS — Z7982 Long term (current) use of aspirin: Secondary | ICD-10-CM | POA: Insufficient documentation

## 2015-07-26 DIAGNOSIS — Y998 Other external cause status: Secondary | ICD-10-CM | POA: Insufficient documentation

## 2015-07-26 DIAGNOSIS — Z87891 Personal history of nicotine dependence: Secondary | ICD-10-CM | POA: Diagnosis not present

## 2015-07-26 DIAGNOSIS — Z86010 Personal history of colonic polyps: Secondary | ICD-10-CM | POA: Diagnosis not present

## 2015-07-26 NOTE — ED Notes (Signed)
Patient presents for fall, reports she stumbled in her driveway and fell landing on her left forearm. C/o pain to left forearm, increased with rotation, able to fully adduct and abduct left arm, strong grip presents, denies numbness and tingling.

## 2015-07-26 NOTE — ED Provider Notes (Signed)
CSN: [id number]     Arrival date & time 07/26/15  1443 History  By signing my name below, I, [name] [name], attest that this documentation has been prepared under the direction and in the presence of [place], PA-C Electronically Signed: Nicole [name], ED Scribe 07/26/2015 at 5:55 PM.  No chief complaint on file.  The history is provided by the patient. No language interpreter was used.   HPI Comments: [name] [name] is a 75 y.o. female who presents to the Emergency Department complaining of sudden onset left forearm pain s/p fall in her driveway earlier today in which she landed with the arm under her body. Pt denies LOC or head trauma in the incident. Pain is 1/10 at rest - worsened to 10/10 with rotation and shoots from her elbow to wrist. Pain is also worsened when she grips objects or flexes at the wrist. No other worsening or alleviating factors noted. No medication taken PTA for symptoms. Pt denies numbness, weakness, tingling, nausea, vomiting, visual disturbances, or any other pertinent symptoms. Pt takes an 81 MG aspirin daily.   Past Medical History  Diagnosis Date  . MVP (mitral valve prolapse)   . Brady-tachy syndrome ([place])   . HLD (hyperlipidemia)   . Palpitation   . Dyspepsia   . Vasovagal syncope   . Colitis, ischemic ([place])   . Fatty liver   . Osteopenia   . Diverticulosis of colon   . IBS (irritable bowel syndrome)   . Incontinence, feces   . Rectocele   . Hypertension   . Diverticulitis   . Rectocele   . nhl dx'd 07/1999    chemo comp 2001; rituxin comp 2005  . Colon polyp 07/29/1993    with focal adenomatous changes  . Chest pain 10/16/13    normal coronary arteries on cath and normal EF   Past Surgical History  Procedure Laterality Date  . Pelvic prolapse repair    . Abdominal hysterectomy    . Bladder suspension    . Bunionectomy    . Cholecystectomy  2003  . Upper gastrointestinal endoscopy  10/2005    hiatal hernia  . Colonoscopy   7/200/, 11/2007    diverticulosis  . Abdominal surgery    . Cardiac catheterization  10/16/13    done for abnormal nuc, no CAD, normal EF  . Left heart catheterization with coronary angiogram N/A 10/16/2013    Procedure: LEFT HEART CATHETERIZATION WITH CORONARY ANGIOGRAM;  Surgeon: Peter M Martinique, MD;  Location: [facility] CATH LAB;  Service: Cardiovascular;  Laterality: N/A;   Family History  Problem Relation Age of Onset  . Colon cancer Father 59  . Prostate cancer Brother   . Colon polyps Brother   . Colon polyps Brother    Social History  Substance Use Topics  . Smoking status: Former Smoker -- 0.33 packs/day    Quit date: 05/04/1967  . Smokeless tobacco: Never Used     Comment: quit 1976  . Alcohol Use: No   OB History    No data available     Review of Systems  Eyes: Negative for visual disturbance.  Gastrointestinal: Negative for nausea and vomiting.  Musculoskeletal: Positive for arthralgias.  Neurological: Negative for weakness and numbness.   Allergies  Pamine forte; Robinul; Sulfa antibiotics; Sulfonamide derivatives; Ace inhibitors; Codeine; Iodine; and Iohexol  Home Medications   Prior to Admission medications   Medication Sig Start Date End Date Taking? Authorizing Provider  ALPRAZolam Duanne Moron) 0.25 MG tablet Take 1  tablet (0.25 mg total) by mouth daily as needed for anxiety. 05/21/15   Nilda Simmer, NP  aspirin EC 81 MG tablet Take 81 mg by mouth daily.    Historical Provider, MD  atenolol (TENORMIN) 25 MG tablet TAKE ONE (1) TABLET EACH DAY 02/25/15   Dorothy Spark, MD  clobetasol ointment (TEMOVATE) AB-123456789 % Apply 1 application topically daily as needed (dermatitis).  05/28/14   Historical Provider, MD  esomeprazole (NEXIUM) 20 MG capsule Take 20 mg by mouth daily at 12 noon.    Historical Provider, MD  fexofenadine (ALLEGRA) 180 MG tablet Take 180 mg by mouth daily. Reported on 06/24/2015    Historical Provider, MD  fish oil-omega-3 fatty acids 1000 MG capsule  Take 1 g by mouth daily.     Historical Provider, MD  Multiple Vitamin (MULTIVITAMIN) tablet Take 1 tablet by mouth daily.      Historical Provider, MD  Polyvinyl Alcohol-Povidone (REFRESH OP) Apply 1 drop to eye 3 (three) times daily.    Historical Provider, MD  Probiotic Product ([place]) CAPS Take 1 capsule by mouth daily. 07/24/12   Gatha Mayer, MD  RESTASIS 0.05 % ophthalmic emulsion Place 1 drop into both eyes 2 (two) times daily.  02/11/15   Historical Provider, MD  Zoledronic Acid (RECLAST IV) Inject into the vein. Takes once a year    Hennie Duos, MD   BP 149/84 mmHg  Pulse 70  Temp(Src) 98.4 F (36.9 C) (Oral)  SpO2 97% Physical Exam  Constitutional: She is oriented to person, place, and time. She appears well-developed and well-nourished.  HENT:  Head: Normocephalic and atraumatic.  Eyes: EOM are normal. Pupils are equal, round, and reactive to light.  Neck:  No midline or paraspinal tenderness. Full ROM without pain.   Cardiovascular: Normal rate, regular rhythm and normal heart sounds.   Pulmonary/Chest: Effort normal and breath sounds normal. No respiratory distress. She has no wheezes. She has no rales.  Abdominal: Soft. She exhibits no distension. There is no tenderness.  Musculoskeletal:       Left elbow: Tenderness found. Radial head and lateral epicondyle tenderness noted.  Full ROM of left upper extremity- pain with pronation/supination and elbow extension. 2+ radial pulse. Sensation intact in ulnar, median, radial distribution. No erythema, ecchymosis, swelling, or effusion present. No deformity noted. All compartments soft.  Neurological: She is alert and oriented to person, place, and time.  Skin: Skin is warm and dry.  Nursing note and vitals reviewed.  ED Course  Procedures (including critical care time) DIAGNOSTIC STUDIES: Oxygen Saturation is 97% on RA, normal by my interpretation.    COORDINATION OF CARE: 4:24 PM-Discussed treatment  plan which includes Dg elbow complete left and DG forearm left with pt at bedside and pt agreed to plan.   Labs Review Labs Reviewed - No data to display  Imaging Review Dg Elbow Complete Left  07/26/2015  CLINICAL DATA:  Fall onto left forearm EXAM: LEFT ELBOW - COMPLETE 3+ VIEW COMPARISON:  None. FINDINGS: Mild cortical irregularity involving the radial neck. Occult nondisplaced fracture is suspected. The joint spaces are preserved. Visualized soft tissues are within normal limits. No definite elbow joint effusion. IMPRESSION: Mild cortical irregularity involving the radial neck, suspicious for occult nondisplaced fracture. No definite elbow joint effusion. Electronically Signed   By: Julian Hy M.D.   On: 07/26/2015 16:31   Dg Forearm Left  07/26/2015  CLINICAL DATA:  Patient presents for fall, reports she stumbled  in her driveway and fell landing on her left forearm. C/o pain to left forearm, increased with rotation, EXAM: LEFT FOREARM - 2 VIEW COMPARISON:  None. FINDINGS: There is no evidence of fracture or other focal bone lesions. Soft tissues are unremarkable. The radial head is incompletely evaluated on this forearm exam. There is suggestion of slight radial head irregularity on AP view. Recommend elbow series for further evaluation. IMPRESSION: Cannot exclude radial head abnormality. Recommend elbow series for further evaluation. No fracture of the shaft of the radius or ulna. Electronically Signed   By: Staci Righter M.D.   On: 07/26/2015 15:53   I have personally reviewed and evaluated these images part of my medical decision-making.   EKG Interpretation None      MDM   Final diagnoses:  Radial neck fracture, left, closed, initial encounter   KAMIA RABON presents after fall just prior to arrival where she fell on right arm. Imaging of the forearm was obtained at triage which showed no abnormalities, however radial head was not fully evaluated and there is the  suggestion of a slight radial head irregularity. On exam, medial tenderness to the elbow and pain with pronation\supination\elbow extension - will obtain full elbow x-ray imaging to assess radial head. Elbow imaging shows mild cortical irregularity of the radial neck suspicious for occult nondisplaced fracture. Posterior splint applied in the ED. Rechecked on patient after splint placement and she appears comfortable. Home care instructions were given. Return precautions given. Patient is followed by orthopedics, Dr. Tonita Cong - instructed patient to call their office first thing Monday morning to schedule follow-up appointment.  I personally performed the services described in this documentation, which was scribed in my presence. The recorded information has been reviewed and is accurate.  [facility], PA-C 07/26/15 1811  Lacretia Leigh, MD 07/26/15 [phone]

## 2015-07-26 NOTE — Discharge Instructions (Signed)
Take Tylenol as needed for pain. Call your orthopedic physician first thing Monday morning to schedule follow-up appointment. Return to the ER for any new or worsening symptoms, any additional concerns.

## 2015-07-28 ENCOUNTER — Telehealth: Payer: Self-pay | Admitting: Internal Medicine

## 2015-07-28 ENCOUNTER — Ambulatory Visit: Payer: Medicare Other | Admitting: Internal Medicine

## 2015-07-28 DIAGNOSIS — S52125A Nondisplaced fracture of head of left radius, initial encounter for closed fracture: Secondary | ICD-10-CM | POA: Diagnosis not present

## 2015-07-28 DIAGNOSIS — M25522 Pain in left elbow: Secondary | ICD-10-CM | POA: Diagnosis not present

## 2015-07-28 NOTE — Telephone Encounter (Signed)
No Charge 

## 2015-08-06 DIAGNOSIS — M25622 Stiffness of left elbow, not elsewhere classified: Secondary | ICD-10-CM | POA: Diagnosis not present

## 2015-08-06 DIAGNOSIS — M62542 Muscle wasting and atrophy, not elsewhere classified, left hand: Secondary | ICD-10-CM | POA: Diagnosis not present

## 2015-08-06 DIAGNOSIS — M79632 Pain in left forearm: Secondary | ICD-10-CM | POA: Diagnosis not present

## 2015-08-06 DIAGNOSIS — S52125D Nondisplaced fracture of head of left radius, subsequent encounter for closed fracture with routine healing: Secondary | ICD-10-CM | POA: Diagnosis not present

## 2015-08-11 DIAGNOSIS — M25622 Stiffness of left elbow, not elsewhere classified: Secondary | ICD-10-CM | POA: Diagnosis not present

## 2015-08-11 DIAGNOSIS — M79632 Pain in left forearm: Secondary | ICD-10-CM | POA: Diagnosis not present

## 2015-08-11 DIAGNOSIS — M62542 Muscle wasting and atrophy, not elsewhere classified, left hand: Secondary | ICD-10-CM | POA: Diagnosis not present

## 2015-08-11 DIAGNOSIS — S52125D Nondisplaced fracture of head of left radius, subsequent encounter for closed fracture with routine healing: Secondary | ICD-10-CM | POA: Diagnosis not present

## 2015-08-13 DIAGNOSIS — M25622 Stiffness of left elbow, not elsewhere classified: Secondary | ICD-10-CM | POA: Diagnosis not present

## 2015-08-13 DIAGNOSIS — M62542 Muscle wasting and atrophy, not elsewhere classified, left hand: Secondary | ICD-10-CM | POA: Diagnosis not present

## 2015-08-13 DIAGNOSIS — M79632 Pain in left forearm: Secondary | ICD-10-CM | POA: Diagnosis not present

## 2015-08-13 DIAGNOSIS — S52125D Nondisplaced fracture of head of left radius, subsequent encounter for closed fracture with routine healing: Secondary | ICD-10-CM | POA: Diagnosis not present

## 2015-08-18 DIAGNOSIS — S52125D Nondisplaced fracture of head of left radius, subsequent encounter for closed fracture with routine healing: Secondary | ICD-10-CM | POA: Diagnosis not present

## 2015-08-18 DIAGNOSIS — M25622 Stiffness of left elbow, not elsewhere classified: Secondary | ICD-10-CM | POA: Diagnosis not present

## 2015-08-18 DIAGNOSIS — M62542 Muscle wasting and atrophy, not elsewhere classified, left hand: Secondary | ICD-10-CM | POA: Diagnosis not present

## 2015-08-18 DIAGNOSIS — M79632 Pain in left forearm: Secondary | ICD-10-CM | POA: Diagnosis not present

## 2015-08-19 DIAGNOSIS — M25522 Pain in left elbow: Secondary | ICD-10-CM | POA: Diagnosis not present

## 2015-08-19 DIAGNOSIS — S52125D Nondisplaced fracture of head of left radius, subsequent encounter for closed fracture with routine healing: Secondary | ICD-10-CM | POA: Diagnosis not present

## 2015-08-21 DIAGNOSIS — M25622 Stiffness of left elbow, not elsewhere classified: Secondary | ICD-10-CM | POA: Diagnosis not present

## 2015-08-21 DIAGNOSIS — M79632 Pain in left forearm: Secondary | ICD-10-CM | POA: Diagnosis not present

## 2015-08-21 DIAGNOSIS — S52125D Nondisplaced fracture of head of left radius, subsequent encounter for closed fracture with routine healing: Secondary | ICD-10-CM | POA: Diagnosis not present

## 2015-08-21 DIAGNOSIS — M62542 Muscle wasting and atrophy, not elsewhere classified, left hand: Secondary | ICD-10-CM | POA: Diagnosis not present

## 2015-08-27 DIAGNOSIS — M25622 Stiffness of left elbow, not elsewhere classified: Secondary | ICD-10-CM | POA: Diagnosis not present

## 2015-08-27 DIAGNOSIS — S52125D Nondisplaced fracture of head of left radius, subsequent encounter for closed fracture with routine healing: Secondary | ICD-10-CM | POA: Diagnosis not present

## 2015-08-27 DIAGNOSIS — M79632 Pain in left forearm: Secondary | ICD-10-CM | POA: Diagnosis not present

## 2015-08-27 DIAGNOSIS — M62542 Muscle wasting and atrophy, not elsewhere classified, left hand: Secondary | ICD-10-CM | POA: Diagnosis not present

## 2015-08-28 DIAGNOSIS — M25622 Stiffness of left elbow, not elsewhere classified: Secondary | ICD-10-CM | POA: Diagnosis not present

## 2015-08-28 DIAGNOSIS — M79632 Pain in left forearm: Secondary | ICD-10-CM | POA: Diagnosis not present

## 2015-08-28 DIAGNOSIS — S52125D Nondisplaced fracture of head of left radius, subsequent encounter for closed fracture with routine healing: Secondary | ICD-10-CM | POA: Diagnosis not present

## 2015-08-28 DIAGNOSIS — M62542 Muscle wasting and atrophy, not elsewhere classified, left hand: Secondary | ICD-10-CM | POA: Diagnosis not present

## 2015-09-02 DIAGNOSIS — S52125D Nondisplaced fracture of head of left radius, subsequent encounter for closed fracture with routine healing: Secondary | ICD-10-CM | POA: Diagnosis not present

## 2015-09-02 DIAGNOSIS — M25622 Stiffness of left elbow, not elsewhere classified: Secondary | ICD-10-CM | POA: Diagnosis not present

## 2015-09-02 DIAGNOSIS — M79632 Pain in left forearm: Secondary | ICD-10-CM | POA: Diagnosis not present

## 2015-09-02 DIAGNOSIS — M62542 Muscle wasting and atrophy, not elsewhere classified, left hand: Secondary | ICD-10-CM | POA: Diagnosis not present

## 2015-09-04 DIAGNOSIS — M62542 Muscle wasting and atrophy, not elsewhere classified, left hand: Secondary | ICD-10-CM | POA: Diagnosis not present

## 2015-09-04 DIAGNOSIS — S52125D Nondisplaced fracture of head of left radius, subsequent encounter for closed fracture with routine healing: Secondary | ICD-10-CM | POA: Diagnosis not present

## 2015-09-04 DIAGNOSIS — M25622 Stiffness of left elbow, not elsewhere classified: Secondary | ICD-10-CM | POA: Diagnosis not present

## 2015-09-04 DIAGNOSIS — M79632 Pain in left forearm: Secondary | ICD-10-CM | POA: Diagnosis not present

## 2015-09-05 DIAGNOSIS — S52125D Nondisplaced fracture of head of left radius, subsequent encounter for closed fracture with routine healing: Secondary | ICD-10-CM | POA: Diagnosis not present

## 2015-09-22 ENCOUNTER — Ambulatory Visit: Payer: Medicare Other | Admitting: Internal Medicine

## 2015-10-02 ENCOUNTER — Ambulatory Visit: Payer: Medicare Other | Admitting: Cardiology

## 2015-10-20 DIAGNOSIS — H43813 Vitreous degeneration, bilateral: Secondary | ICD-10-CM | POA: Diagnosis not present

## 2015-10-20 DIAGNOSIS — H35342 Macular cyst, hole, or pseudohole, left eye: Secondary | ICD-10-CM | POA: Diagnosis not present

## 2015-10-28 ENCOUNTER — Encounter: Payer: Self-pay | Admitting: Cardiology

## 2015-10-28 ENCOUNTER — Ambulatory Visit (INDEPENDENT_AMBULATORY_CARE_PROVIDER_SITE_OTHER): Payer: Medicare Other | Admitting: Cardiology

## 2015-10-28 VITALS — BP 118/68 | HR 60 | Ht 63.5 in | Wt 127.6 lb

## 2015-10-28 DIAGNOSIS — R002 Palpitations: Secondary | ICD-10-CM

## 2015-10-28 DIAGNOSIS — I472 Ventricular tachycardia: Secondary | ICD-10-CM | POA: Diagnosis not present

## 2015-10-28 DIAGNOSIS — I4729 Other ventricular tachycardia: Secondary | ICD-10-CM

## 2015-10-28 DIAGNOSIS — I10 Essential (primary) hypertension: Secondary | ICD-10-CM

## 2015-10-28 DIAGNOSIS — R0609 Other forms of dyspnea: Secondary | ICD-10-CM

## 2015-10-28 DIAGNOSIS — R0600 Dyspnea, unspecified: Secondary | ICD-10-CM

## 2015-10-28 DIAGNOSIS — I341 Nonrheumatic mitral (valve) prolapse: Secondary | ICD-10-CM

## 2015-10-28 MED ORDER — ATENOLOL 25 MG PO TABS: ORAL_TABLET | ORAL | Status: DC

## 2015-10-28 NOTE — Progress Notes (Signed)
Patient ID: [name] [name], female   DOB: 06-07-1940, 75 y.o.   MRN: [id number]    Patient Name: [name] [name] Date of Encounter: 10/28/2015  Primary Care Provider:  [name] Primary Cardiologist:  Ena Dawley (previously Dr Verl Blalock)  Problem List   Past Medical History  Diagnosis Date  . MVP (mitral valve prolapse)   . Brady-tachy syndrome ([place])   . HLD (hyperlipidemia)   . Palpitation   . Dyspepsia   . Vasovagal syncope   . Colitis, ischemic ([place])   . Fatty liver   . Osteopenia   . Diverticulosis of colon   . IBS (irritable bowel syndrome)   . Incontinence, feces   . Rectocele   . Hypertension   . Diverticulitis   . Rectocele   . nhl dx'd 07/1999    chemo comp 2001; rituxin comp 2005  . Colon polyp 07/29/1993    with focal adenomatous changes  . Chest pain 10/16/13    normal coronary arteries on cath and normal EF   Past Surgical History  Procedure Laterality Date  . Pelvic prolapse repair    . Abdominal hysterectomy    . Bladder suspension    . Bunionectomy    . Cholecystectomy  2003  . Upper gastrointestinal endoscopy  10/2005    hiatal hernia  . Colonoscopy  7/200/, 11/2007    diverticulosis  . Abdominal surgery    . Cardiac catheterization  10/16/13    done for abnormal nuc, no CAD, normal EF  . Left heart catheterization with coronary angiogram N/A 10/16/2013    Procedure: LEFT HEART CATHETERIZATION WITH CORONARY ANGIOGRAM;  Surgeon: Peter M Martinique, MD;  Location: [facility] CATH LAB;  Service: Cardiovascular;  Laterality: N/A;   Allergies  Allergies  Allergen Reactions  . Pamine Forte [Methscopolamine] Other (See Comments)    Unable to urinate  . Robinul [Glycopyrrolate] Other (See Comments)    Unable to urinate  . Sulfa Antibiotics Swelling    Face swelling   . Sulfonamide Derivatives Swelling    Face swelling   . Ace Inhibitors Hives  . Codeine Nausea Only  . Iodine Hives  . Iohexol Hives     Desc: 50 mg benadryl prior to exam      Chief complain: DOE, MVP  HPI  [name] [name] returns today for evaluation and management of DOE, and mitral valve prolapse.  She underwent a stress nuclear study in 2015 with abnormal stress portion, with ST depressions and chest pain during exertion as well as hypotension. There was no perfusion defect but transient ischemic dilatation and therefore patient was referred for cardiac catheterization that was normal. The patient saw Cecilie Kicks and underwent 2 week event monitoring that showed 2 episodes of nonsustained ventricular tachycardia which total beats of 11 and 14.  Since the last visit she denies any syncope or presyncope, occasional palpitations, no chest pain, stable DOE. She is requesting to have carotid US as there is a family h/o carotid disease.  10/28/2015 - the patient is coming after 6 months she is currently going through mourning period as she lost her husband of 60 years due to cancer 3 weeks ago. She feels like she has A great family and friend support. In the meantime she denies any chest pain or shortness of breath. She has had no palpitations dizziness or syncope. No lower extremity edema appear proximal nocturnal dyspnea. She is compliant with her medicines. She is due to see her primary care physician in about a  week when she will get her labs done.   Home Medications  Prior to Admission medications   Medication Sig Start Date End Date Taking? Authorizing Provider  aspirin EC 81 MG tablet Take 81 mg by mouth daily.   Yes Historical Provider, MD  atenolol (TENORMIN) 25 MG tablet TAKE ONE (1) TABLET EACH DAY   Yes Dorothy Spark, MD  calcium carbonate (OS-CAL) 600 MG TABS Take 600 mg by mouth 2 (two) times daily with a meal.     Yes Historical Provider, MD  esomeprazole (NEXIUM) 20 MG capsule Take 20 mg by mouth daily before breakfast.   Yes Historical Provider, MD  fish oil-omega-3 fatty acids 1000 MG capsule Take 2 g by mouth daily.     Yes Historical Provider,  MD  Multiple Vitamin (MULTIVITAMIN) tablet Take 1 tablet by mouth daily.     Yes Historical Provider, MD  Probiotic Product ([place]) CAPS Take 1 capsule by mouth daily. 07/24/12  Yes Gatha Mayer, MD    Family History  Family History  Problem Relation Age of Onset  . Colon cancer Father 75  . Prostate cancer Brother   . Colon polyps Brother   . Colon polyps Brother     Social History  Social History   Social History  . Marital Status: Married    Spouse Name: N/A  . Number of Children: 1  . Years of Education: N/A   Occupational History  . Clerical work part time   .     Social History Main Topics  . Smoking status: Former Smoker -- 0.33 packs/day    Quit date: 05/04/1967  . Smokeless tobacco: Never Used     Comment: quit 1976  . Alcohol Use: No  . Drug Use: No  . Sexual Activity: Yes    Birth Control/ Protection: Surgical   Other Topics Concern  . Not on file   Social History Narrative     Review of Systems, as per HPI, otherwise negative General:  No chills, fever, night sweats or weight changes.  Cardiovascular:  No chest pain, dyspnea on exertion, edema, orthopnea, palpitations, paroxysmal nocturnal dyspnea. Dermatological: No rash, lesions/masses Respiratory: No cough, dyspnea Urologic: No hematuria, dysuria Abdominal:   No nausea, vomiting, diarrhea, bright red blood per rectum, melena, or hematemesis Neurologic:  No visual changes, wkns, changes in mental status. All other systems reviewed and are otherwise negative except as noted above.  Physical Exam  Blood pressure 118/68, pulse 60, height 5' 3.5" (1.613 m), weight 127 lb 9.6 oz (57.879 kg), SpO2 99 %.  General: Pleasant, NAD Psych: Normal affect. Neuro: Alert and oriented X 3. Moves all extremities spontaneously. HEENT: Normal  Neck: Supple without bruits or JVD. Lungs:  Resp regular and unlabored, CTA. Heart: RRR no s3, s4, midsystolic click followed by a soft  murmur.. Abdomen: Soft, non-tender, non-distended, BS + x 4.  Extremities: No clubbing, cyanosis or edema. DP/PT/Radials 2+ and equal bilaterally.  Labs:  No results for input(s): CKTOTAL, CKMB, TROPONINI in the last 72 hours. Lab Results  Component Value Date   WBC 6.7 06/24/2015   HGB 14.3 06/24/2015   HCT 42.8 06/24/2015   MCV 89.6 06/24/2015   PLT 228 06/24/2015    No results found for: DDIMER Invalid input(s): POCBNP    Component Value Date/Time   NA 143 06/24/2015 0852   NA 142 03/07/2015 1521   NA 143 10/12/2013 1305   K 4.3 06/24/2015 0852   K 4.3  03/07/2015 1521   CL 101 03/07/2015 1521   CL 105 01/20/2012 1021   CO2 27 06/24/2015 0852   CO2 25 03/07/2015 1521   GLUCOSE 95 06/24/2015 0852   GLUCOSE 93 03/07/2015 1521   GLUCOSE 90 10/12/2013 1305   GLUCOSE 91 01/20/2012 1021   BUN 11.9 06/24/2015 0852   BUN 15 03/07/2015 1521   BUN 18 10/12/2013 1305   CREATININE 0.8 06/24/2015 0852   CREATININE 0.76 03/07/2015 1521   CALCIUM 10.1 06/24/2015 0852   CALCIUM 10.6* 03/07/2015 1521   PROT 6.8 06/24/2015 0852   PROT 6.3 03/07/2015 1521   PROT 6.5 03/03/2011 1058   ALBUMIN 3.8 06/24/2015 0852   ALBUMIN 4.6 03/07/2015 1521   ALBUMIN 4.5 03/03/2011 1058   AST 25 06/24/2015 0852   AST 20 03/07/2015 1521   ALT 27 06/24/2015 0852   ALT 20 03/07/2015 1521   ALKPHOS 78 06/24/2015 0852   ALKPHOS 70 03/07/2015 1521   BILITOT 0.34 06/24/2015 0852   BILITOT <0.2 03/07/2015 1521   BILITOT 0.3 03/03/2011 1058   GFRNONAA 78 03/07/2015 1521   GFRAA 89 03/07/2015 1521   Lab Results  Component Value Date   CHOL 217* 02/06/2013   HDL 47 02/06/2013   LDLCALC 137* 02/06/2013   TRIG 166* 02/06/2013    Accessory Clinical Findings  Echocardiogram - 09/12/2013 Left ventricle: The cavity size was normal. Wall thickness was normal. Systolic function was normal. The estimated ejection fraction was 55%. Wall motion was normal; there were no regional wall motion  abnormalities. Aortic valve: Trileaflet. Doppler: There was no stenosis. No regurgitation. Aorta: Aortic root normal size. Mitral valve: Minimal prolapse of the posterior leaflet. Doppler: There was no evidence for stenosis. Mild regurgitation. Peak gradient: 40mm Hg (D). Left atrium: The atrium was normal in size. Right ventricle: The cavity size was normal. Systolic function was normal. Pulmonic valve: The valve appears to be grossly normal. Tricuspid valve: Doppler: Mild regurgitation. Right atrium: The atrium was normal in size. Pericardium: The pericardium was normal in appearance. Systemic veins: Inferior vena cava: The vessel was normal in size; the respirophasic diameter changes were in the normal range (= 50%); findings are consistent with normal central venous pressure. Post procedure conclusions Ascending Aorta:  - Aortic root normal size.  Cardiac MRI: 12/2013 IMPRESSION: 1. Normal left ventricular size, thickness and systolic function (LVEF = 56%). No late gadolinium enhancement seen in the left ventricular myocardium.  2. Normal right ventricular size, thickness and systolic function (RVEF = 56%). No regional wall motion abnormalities. No abnormal fat or LGE seen in the right ventricular myocardium. Conclusively, there is no evidence for arrhythmogenic right ventricular dysplasia.  3. Prolapse of the anterior mitral valve leaflet associated with mild mitral regurgitation.  4. Mild tricuspid regurgitation.    Assessment & Plan  75 year old female  1. Dyspnea on exertion is progressively worsening, abnormal EKG portion of the stress test with normal cardiac catheterization. Event monitor showed 2 episodes of nonsustained ventricular tachycardia lasting 14 and 11 cycles.Cardiac MRI was showed normal biventricular function and ruled out infiltrative or inflammatory cardiomyopathy. Repeat echocardiogram in March 2017 showed normal biventricular size, thickness  and function.  2. nsVTs - evaluated by EP - "I have discussed the benign nature of her symptoms. As they are currently minimally symptomatic, I have recommended she take her beta blocker and undergo watchful waiting. I would be glad to see her back as needed for worsening symptoms. Flecainide would be another medical option in conjunction  with her beta blocker if she has bothersome symptoms."  3. Hypertension - controlled, continue atenolol.  4. Mitral prolapse - mild associated with trivial MR only.   5. Lipids - followed by primary care physician. She is about to have them checked we will fax his results.  Follow-up in one year.   Ena Dawley, MD, [facility] 10/28/2015, 8:39 AM

## 2015-10-28 NOTE — Patient Instructions (Addendum)

## 2015-11-18 ENCOUNTER — Ambulatory Visit (INDEPENDENT_AMBULATORY_CARE_PROVIDER_SITE_OTHER): Payer: Medicare Other | Admitting: Internal Medicine

## 2015-11-18 ENCOUNTER — Encounter: Payer: Self-pay | Admitting: Internal Medicine

## 2015-11-18 VITALS — BP 140/60 | HR 60 | Ht 63.5 in | Wt 128.2 lb

## 2015-11-18 DIAGNOSIS — K5909 Other constipation: Secondary | ICD-10-CM

## 2015-11-18 DIAGNOSIS — R194 Change in bowel habit: Secondary | ICD-10-CM

## 2015-11-18 DIAGNOSIS — G8929 Other chronic pain: Secondary | ICD-10-CM

## 2015-11-18 DIAGNOSIS — K6289 Other specified diseases of anus and rectum: Secondary | ICD-10-CM | POA: Diagnosis not present

## 2015-11-18 DIAGNOSIS — R1012 Left upper quadrant pain: Secondary | ICD-10-CM

## 2015-11-18 NOTE — Patient Instructions (Addendum)
  8 You have been scheduled to have an anorectal manometry at [facility] on _8/16/17_ at 10:30AM_. Please arrive 30 minutes prior to your appointment time for registration (1st floor of the hospital-admissions).  Please make certain to use 1 Fleets enema 2 hours prior to coming for your appointment. You can purchase Fleets enemas from the laxative section at your drug store. You should not eat anything during the two hours prior to the procedure. You may take regular medications with small sips of water at least 2 hours prior to the study.  Anorectal manometry is a test performed to evaluate patients with constipation or fecal incontinence. This test measures the pressures of the anal sphincter muscles, the sensation in the rectum, and the neural reflexes that are needed for normal bowel movements.  THE PROCEDURE The test takes approximately 30 minutes to 1 hour. You will be asked to change into a hospital gown. A technician or nurse will explain the procedure to you, take a brief health history, and answer any questions you may have. The patient then lies on his or her left side. A small, flexible tube, about the size of a thermometer, with a balloon at the end is inserted into the rectum. The catheter is connected to a machine that measures the pressure. During the test, the small balloon attached to the catheter may be inflated in the rectum to assess the normal reflex pathways. The nurse or technician may also ask the person to squeeze, relax, and push at various times. The anal sphincter muscle pressures are measured during each of these maneuvers. To squeeze, the patient tightens the sphincter muscles as if trying to prevent anything from coming out. To push or bear down, the patient strains down as if trying to have a bowel movement.   Today you have been given a handout on benefiber to read.     I appreciate the opportunity to care for you. Silvano Rusk, MD, [facility]

## 2015-11-18 NOTE — Progress Notes (Signed)
   Subjective:    Patient ID: [name] [name], female    DOB: [birth year], 75 y.o.   MRN: [id number] Cc: altered bowels, LUQ pain HPI Having problems with irregular defecation - goes several days w/o defecation then will have numerous bowel movements formed to loose that get urgent. Associated crampy lower abdominal pain. Makes it difficult to leave the house. Wonders if rectocele part of the problem. Miralax makes her gush all the time. Cannot pinpoint triggers.  Also w/ recurrent LUQ sharp pains - has had in past. Grieving husbands death 10/29/2022) but coping. About to go to beach with family.  Medications, allergies, past medical history, past surgical history, family history and social history are reviewed and updated in the EMR.  Review of Systems No sig GU Hx bladder tack    Objective:   Physical Exam BP 140/60 mmHg  Pulse 60  Ht 5' 3.5" (1.613 m)  Wt 128 lb 3.2 oz (58.151 kg)  BMI 22.35 kg/m2 NAD Eyes anicteric abd soft NT and no rib tenderness, no mass Appropriate mood/affect Rectal  Patti Martinique, [place] present.   Small rectocele mildly decreased resting anal tone and significantly decreased voluntary squeeze Mild reduced descent but does relax sphincter and appropriate abdominal contraction with simulated defecation     Assessment & Plan:   Encounter Diagnoses  Name Primary?  Marland Kitchen Altered bowel habits Yes  . Other constipation   . Decreased anal sphincter tone    . Chronic LUQ pain     Anorectal manometry to evaluate for suspected pelvic floor dysfunction 2 tbsp Benefiber qd [place] so will do mano after that May need PT for pelvic floor LUQ pain seems musculoskeletal vs functional - observe  I appreciate the opportunity to care for this patient.  [id number] STEPHEN

## 2015-11-19 ENCOUNTER — Encounter: Payer: Self-pay | Admitting: Internal Medicine

## 2015-11-27 ENCOUNTER — Encounter: Payer: Medicare Other | Admitting: Nurse Practitioner

## 2015-12-01 ENCOUNTER — Ambulatory Visit (INDEPENDENT_AMBULATORY_CARE_PROVIDER_SITE_OTHER): Payer: Medicare Other | Admitting: Nurse Practitioner

## 2015-12-01 ENCOUNTER — Encounter: Payer: Self-pay | Admitting: Nurse Practitioner

## 2015-12-01 VITALS — BP 112/76 | Ht 62.5 in | Wt 128.0 lb

## 2015-12-01 DIAGNOSIS — Z0000 Encounter for general adult medical examination without abnormal findings: Secondary | ICD-10-CM | POA: Diagnosis not present

## 2015-12-03 ENCOUNTER — Encounter: Payer: Self-pay | Admitting: Nurse Practitioner

## 2015-12-03 DIAGNOSIS — E785 Hyperlipidemia, unspecified: Secondary | ICD-10-CM | POA: Diagnosis not present

## 2015-12-03 NOTE — Progress Notes (Signed)
   Subjective:    Patient ID: [name] [name], female    DOB: [birth year], 75 y.o.   MRN: [id number]  HPI presents for her wellness exam. Lost her husband about 8 weeks ago. Has an excellent support system. Feeling some depression but defers need for daily med or counseling at this time. Regular vision and dental exams. Gets bone density and Reclast through Dr. Melissa Noon office. Regular walking program.     Review of Systems  Constitutional: Negative for activity change, appetite change and fatigue.  HENT: Negative for dental problem, ear pain, sinus pressure and sore throat.   Respiratory: Negative for cough, chest tightness, shortness of breath and wheezing.   Cardiovascular: Negative for chest pain.  Gastrointestinal: Negative for abdominal distention, abdominal pain, constipation, diarrhea, nausea and vomiting.  Genitourinary: Negative for difficulty urinating, dysuria, enuresis, frequency, genital sores, pelvic pain, urgency and vaginal discharge.       Objective:   Physical Exam  Constitutional: She is oriented to person, place, and time. She appears well-developed. No distress.  HENT:  Right Ear: External ear normal.  Left Ear: External ear normal.  Mouth/Throat: Oropharynx is clear and moist.  Neck: Normal range of motion. Neck supple. No tracheal deviation present. No thyromegaly present.  Cardiovascular: Normal rate, regular rhythm and normal heart sounds.  Exam reveals no gallop.   No murmur heard. Pulmonary/Chest: Effort normal and breath sounds normal.  Abdominal: Soft. She exhibits no distension. There is no tenderness.  Genitourinary: Vagina normal. No vaginal discharge found.  Genitourinary Comments: External GU: no rashes or lesions. Vagina: no discharge. Bimanual exam: no tenderness or obvious masses. Rectal exam: no masses; no stool for hemoccult.   Musculoskeletal: She exhibits no edema.  Lymphadenopathy:    She has no cervical adenopathy.  Neurological: She is  alert and oriented to person, place, and time.  Skin: Skin is warm and dry. No rash noted.  Psychiatric: She has a normal mood and affect. Her behavior is normal.  Vitals reviewed. Breast exam: no masses; axillae no adenopathy.         Assessment & Plan:  Routine general medical examination at a health care facility - Plan: Lipid panel, IFOBT POC (occult bld, rslt in office)  Given Rx for Dt vaccine. Patient to schedule her own mammogram this fall.  Return in about 1 year (around 11/30/2016) for physical.

## 2015-12-04 LAB — LIPID PANEL
CHOL/HDL RATIO: 4.3 ratio (ref 0.0–4.4)
Cholesterol, Total: 203 mg/dL — ABNORMAL HIGH (ref 100–199)
HDL: 47 mg/dL (ref >39)
LDL Calculated: 133 mg/dL — ABNORMAL HIGH (ref 0–99)
TRIGLYCERIDES: 114 mg/dL (ref 0–149)
VLDL Cholesterol Cal: 23 mg/dL (ref 5–40)

## 2015-12-05 ENCOUNTER — Other Ambulatory Visit: Payer: Self-pay | Admitting: Family Medicine

## 2015-12-05 DIAGNOSIS — Z1231 Encounter for screening mammogram for malignant neoplasm of breast: Secondary | ICD-10-CM

## 2015-12-10 LAB — IFOBT (OCCULT BLOOD): IMMUNOLOGICAL FECAL OCCULT BLOOD TEST: NEGATIVE

## 2015-12-12 DIAGNOSIS — M810 Age-related osteoporosis without current pathological fracture: Secondary | ICD-10-CM | POA: Diagnosis not present

## 2015-12-12 DIAGNOSIS — M150 Primary generalized (osteo)arthritis: Secondary | ICD-10-CM | POA: Diagnosis not present

## 2015-12-22 DIAGNOSIS — G245 Blepharospasm: Secondary | ICD-10-CM | POA: Diagnosis not present

## 2015-12-23 DIAGNOSIS — D2272 Melanocytic nevi of left lower limb, including hip: Secondary | ICD-10-CM | POA: Diagnosis not present

## 2015-12-23 DIAGNOSIS — D485 Neoplasm of uncertain behavior of skin: Secondary | ICD-10-CM | POA: Diagnosis not present

## 2015-12-23 DIAGNOSIS — L500 Allergic urticaria: Secondary | ICD-10-CM | POA: Diagnosis not present

## 2015-12-23 DIAGNOSIS — L821 Other seborrheic keratosis: Secondary | ICD-10-CM | POA: Diagnosis not present

## 2015-12-24 ENCOUNTER — Ambulatory Visit (HOSPITAL_COMMUNITY)
Admission: RE | Admit: 2015-12-24 | Discharge: 2015-12-24 | Disposition: A | Discharge status: Home / Self Care | Payer: Medicare Other | Source: Ambulatory Visit | Attending: Internal Medicine | Admitting: Internal Medicine

## 2015-12-24 ENCOUNTER — Encounter (HOSPITAL_COMMUNITY)
Admission: RE | Disposition: A | Discharge status: Home / Self Care | Payer: Self-pay | Source: Ambulatory Visit | Attending: Internal Medicine

## 2015-12-24 DIAGNOSIS — K5900 Constipation, unspecified: Secondary | ICD-10-CM | POA: Diagnosis not present

## 2015-12-24 DIAGNOSIS — K5902 Outlet dysfunction constipation: Secondary | ICD-10-CM | POA: Diagnosis not present

## 2015-12-24 DIAGNOSIS — R197 Diarrhea, unspecified: Secondary | ICD-10-CM | POA: Diagnosis not present

## 2015-12-24 DIAGNOSIS — R159 Full incontinence of feces: Secondary | ICD-10-CM

## 2015-12-24 HISTORY — PX: ANAL RECTAL MANOMETRY: SHX6358

## 2015-12-24 SURGERY — MANOMETRY, ANORECTAL

## 2015-12-24 NOTE — Progress Notes (Signed)
Anal Manometry done per protocol. Pt tolerated well without complication. Report to be sent to Dr. Edison Simon.

## 2015-12-25 ENCOUNTER — Encounter (HOSPITAL_COMMUNITY): Payer: Self-pay | Admitting: Internal Medicine

## 2015-12-25 ENCOUNTER — Telehealth: Payer: Self-pay | Admitting: Internal Medicine

## 2015-12-25 ENCOUNTER — Telehealth: Payer: Self-pay | Admitting: Family Medicine

## 2015-12-25 DIAGNOSIS — M6289 Other specified disorders of muscle: Secondary | ICD-10-CM

## 2015-12-25 NOTE — Telephone Encounter (Signed)
Pt calling for results, please advise

## 2015-12-25 NOTE — Telephone Encounter (Signed)
Please explain there is usually a few days or more for this to get read. Should be available next week.

## 2015-12-25 NOTE — Telephone Encounter (Signed)
Please review report of Dermatopathology in blue folder.

## 2015-12-25 NOTE — Telephone Encounter (Signed)
Pt aware.

## 2015-12-26 DIAGNOSIS — M5416 Radiculopathy, lumbar region: Secondary | ICD-10-CM | POA: Diagnosis not present

## 2015-12-26 DIAGNOSIS — M5136 Other intervertebral disc degeneration, lumbar region: Secondary | ICD-10-CM | POA: Diagnosis not present

## 2015-12-30 DIAGNOSIS — M810 Age-related osteoporosis without current pathological fracture: Secondary | ICD-10-CM | POA: Diagnosis not present

## 2015-12-31 ENCOUNTER — Encounter: Payer: Self-pay | Admitting: Internal Medicine

## 2015-12-31 DIAGNOSIS — K5901 Slow transit constipation: Secondary | ICD-10-CM | POA: Insufficient documentation

## 2015-12-31 DIAGNOSIS — K5902 Outlet dysfunction constipation: Secondary | ICD-10-CM

## 2015-12-31 DIAGNOSIS — K6289 Other specified diseases of anus and rectum: Secondary | ICD-10-CM

## 2015-12-31 HISTORY — DX: Outlet dysfunction constipation: K59.02

## 2015-12-31 HISTORY — DX: Other specified diseases of anus and rectum: K62.89

## 2015-12-31 NOTE — Telephone Encounter (Signed)
I called her with the results - she has weak sphincters, pelvic floor dysfunction Please schedule for PT with Earlie Counts to evaluate and treat pelvic floor dysfunction and dyssynergic defecation.

## 2015-12-31 NOTE — Telephone Encounter (Signed)
Patient will be contacted by [facility] office directly.  Patient notified she is aware that they will be calling.

## 2015-12-31 NOTE — Addendum Note (Signed)
Addended by: Marlon Pel on: 12/31/2015 02:21 PM   Modules accepted: Orders

## 2016-01-01 DIAGNOSIS — K5902 Outlet dysfunction constipation: Secondary | ICD-10-CM

## 2016-01-01 DIAGNOSIS — R159 Full incontinence of feces: Secondary | ICD-10-CM

## 2016-01-06 ENCOUNTER — Ambulatory Visit: Payer: Medicare Other | Attending: Internal Medicine | Admitting: Physical Therapy

## 2016-01-06 DIAGNOSIS — M6281 Muscle weakness (generalized): Secondary | ICD-10-CM | POA: Diagnosis not present

## 2016-01-06 DIAGNOSIS — R279 Unspecified lack of coordination: Secondary | ICD-10-CM | POA: Insufficient documentation

## 2016-01-06 NOTE — Therapy (Signed)
[facility] [address] [place], [place], [address] Phone: [phone]   Fax:  [phone]  Physical Therapy Evaluation  Patient Details  Name: [name] [name] MRN: [id number] Date of Birth: [birth year] Referring Provider: Dr. Silvano Rusk  Encounter Date: 01/06/2016      PT End of Session - 01/06/16 0918    Visit Number 1   Number of Visits 10   Date for PT Re-Evaluation 03/02/16   Authorization Type medicare 10th visit g-code   PT Start Time 0800   PT Stop Time 0845   PT Time Calculation (min) 45 min   Activity Tolerance Patient tolerated treatment well   Behavior During Therapy [facility] for tasks assessed/performed      Past Medical History:  Diagnosis Date  . Anal sphincter incompetence 12/31/2015   Manometry 12/2015  . Brady-tachy syndrome ([place])   . Chest pain 10/16/13   normal coronary arteries on cath and normal EF  . Colitis, ischemic ([place])   . Colon polyp 07/29/1993   with focal adenomatous changes  . Diverticulitis   . Diverticulosis of colon   . Dyspepsia   . Dyssynergic defecation 12/31/2015   Anorectal mano 12/2015, also has rectal hypersensitivity  . Fatty liver   . HLD (hyperlipidemia)   . Hypertension   . IBS (irritable bowel syndrome)   . Incontinence, feces   . MVP (mitral valve prolapse)   . nhl dx'd 07/1999   chemo comp 2001; rituxin comp 2005  . Osteopenia   . Palpitation   . Rectocele   . Rectocele   . Vasovagal syncope     Past Surgical History:  Procedure Laterality Date  . ABDOMINAL HYSTERECTOMY    . ABDOMINAL SURGERY  2001    for non hodgkins lymphoma; lymphoma in mesentary  . ANAL RECTAL MANOMETRY N/A 12/24/2015   Procedure: ANO RECTAL MANOMETRY;  Surgeon: Gatha Mayer, MD;  Location: [facility];  Service: Endoscopy;  Laterality: N/A;  . BLADDER SUSPENSION    . BUNIONECTOMY    . CHOLECYSTECTOMY  2003  . COLONOSCOPY  7/200/, 11/2007   diverticulosis  . LEFT HEART  CATHETERIZATION WITH CORONARY ANGIOGRAM N/A 10/16/2013   Procedure: LEFT HEART CATHETERIZATION WITH CORONARY ANGIOGRAM;  Surgeon: Peter M Martinique, MD;  Location: [facility] CATH LAB;  Service: Cardiovascular;  Laterality: N/A;  . pelvic prolapse repair    . UPPER GASTROINTESTINAL ENDOSCOPY  10/2005   hiatal hernia    There were no vitals filed for this visit.       Subjective Assessment - 01/06/16 [id number]    Subjective My doctor said my muscles are weak. Patient has control. when has the strong urge for a bowel movement she can wait 30 min and will have to go again in 1 hour.  Goes to the bathroom every 2-3 days.  When she has to have a bowel movement she has to rush to the toilet. No urinary leakage. Patient had a bladder tack in the 1990's.    Patient Stated Goals have bowel movement without having to go again in 1 hour   Currently in Pain? No/denies   Multiple Pain Sites No            [facility] PT Assessment - 01/06/16 0001      Assessment   Medical Diagnosis N81.84 Pelvic floor dysfunction   Referring Provider Dr. Silvano Rusk   Onset Date/Surgical Date 06/04/15   Prior Therapy None     Precautions   Precautions  Other (comment)   Precaution Comments No joint mobilization, no trunk flexion     Restrictions   Weight Bearing Restrictions No     Balance Screen   Has the patient fallen in the past 6 months Yes   How many times? 1  stubbed her toe and fell, broke left elbow   Has the patient had a decrease in activity level because of a fear of falling?  No  does not thnik it was related to balance   Is the patient reluctant to leave their home because of a fear of falling?  No     Home Ec[employer] residence     Prior Function   Level of Independence Independent   Leisure 3 mile walk, cardio aerobics 30 min, handweights 30 min, yoga     Cognition   Overall Cognitive Status Within Functional Limits for tasks assessed     Observation/Other Assessments    Focus on Therapeutic Outcomes (FOTO)  Therapist discretion 20% limitation     Posture/Postural Control   Posture/Postural Control No significant limitations     ROM / Strength   AROM / PROM / Strength AROM;Strength;PROM     PROM   Overall PROM Comments bil. hip ER is limited     Strength   Overall Strength Comments bil. hip abduction and extension 4/5                 Pelvic Floor Special Questions - 01/06/16 0001    Currently Sexually Active No   Urinary Leakage No   Fecal incontinence No   Skin Integrity Intact   Pelvic Floor Internal Exam Patient confirmed identification and approves PT to assess pelvic floor muscle integrity   Exam Type Rectal   Strength fair squeeze, definite lift   Strength # of reps 3   Strength # of seconds 8                  PT Education - 01/06/16 0917    Education provided Yes   Education Details information on rectocele; pelvic floor exercise   Person(s) Educated Patient   Methods Explanation;Demonstration;Verbal cues;Handout   Comprehension Returned demonstration;Verbalized understanding          PT Short Term Goals - 01/06/16 [id number]      PT SHORT TERM GOAL #1   Title understand correct toileting technique to relax the pelvic floor correctly   Time 4   Period Weeks   Status New     PT SHORT TERM GOAL #2   Title understand bowel health   Time 4   Period Weeks   Status New     PT SHORT TERM GOAL #3   Title understand how to do abdominal massage to assist in bowel movements   Time 4   Period Weeks   Status New           PT Long Term Goals - 01/06/16 [id number]      PT LONG TERM GOAL #1   Title independent with pelvic floor strengthening program   Time 8   Period Weeks   Status New     PT LONG TERM GOAL #2   Title ability to have a bowel movement and not have to go back in 1 hour to complete   Time 8   Period Weeks   Status New     PT LONG TERM GOAL #3   Title urge to have a bowel movement decreased  >/=  25% due to increased pelvic floor strength   Time 8   Period Weeks   Status New               Plan - 2016-01-21 [id number]    Clinical Impression Statement Patient is a 75 year old female with diagnosis of pelvic floor dysfunction with sudden onset.  Patient reports no pain.  Patient pelvic floor strength anally is 3/5 with difficulty pushing the therapist finger out of the anus.  Patient is able to contract pelvic floor for 8 seconds and not contract the gluteals.  During quick contractions she takes time to full relax prior to the next contraction.  Patient will have a bowel movement every 2-3 days.  After she has a bowel movement, she will have to have another 1 later.  Patient reports not fecal or urinary incontinence.  Patient reports when she has the urge for a bowel movement she will be able to hold it for 30 min. but when close to the commode she has to rush. Patient is of low complexity due to comorbidities due not affect her recovery.  Patient will benefit from strengthening of pelvic floor.    Rehab Potential Excellent   Clinical Impairments Affecting Rehab Potential None   PT Frequency 1x / week   PT Duration 8 weeks   PT Treatment/Interventions Biofeedback;Therapeutic activities;Therapeutic exercise;Neuromuscular re-education;Patient/family education;Manual techniques   PT Next Visit Plan toileting technique, diaphgramatic breathing, abdominal exercise, bowel health; abdominal massage; pelvic floor EMG   PT Home Exercise Plan bowel health, toileting technique   Recommended Other Services None   Consulted and Agree with Plan of Care Patient      Patient will benefit from skilled therapeutic intervention in order to improve the following deficits and impairments:  Decreased strength, Decreased coordination  Visit Diagnosis: Muscle weakness (generalized)  Unspecified lack of coordination      G-Codes - 01/21/16 0845    Functional Assessment Tool Used Therapist discretion  20% limitation  goal is 10%limitation   Functional Limitation Other PT primary   Other PT Primary Current Status [id number]) At least 20 percent but less than 40 percent impaired, limited or restricted   Other PT Primary Goal Status [id number]) At least 1 percent but less than 20 percent impaired, limited or restricted       Problem List Patient Active Problem List   Diagnosis Date Noted  . Constipation due to outlet dysfunction   . Fecal incontinence   . Anal sphincter incompetence 12/31/2015  . Dyssynergic defecation 12/31/2015  . Heart palpitations 07/02/2015  . Lipoma of arm 06/30/2015  . HTN (hypertension) 04/18/2015  . Dizziness 04/18/2015  . Ocular hypertension 11/19/2014  . Dry eye syndrome 11/19/2014  . Glaucoma suspect 11/19/2014  . Disorder of macula of retina 11/19/2014  . DOE (dyspnea on exertion) 11/27/2013  . NSVT (nonsustained ventricular tachycardia) ([place]) 11/27/2013  . Abdominal pain, unspecified site 11/07/2013  . Tachycardia 10/30/2013  . Dyspnea on exertion 10/01/2013  . Allergic rhinitis 08/20/2013  . Diverticulitis of colon 01/08/2011  . Chest pain on exertion 10/01/2010  . HYPERLIPIDEMIA-MIXED 08/27/2008  . MVP (mitral valve prolapse) 08/27/2008  . DYSPEPSIA 10/13/2007  .  IBS- diarrhea predominant 10/13/2007  . DIVERTICULOSIS OF COLON 06/16/2007  . OSTEOPENIA 06/16/2007  . NHL (non-Hodgkin's lymphoma) ([place]) 06/16/2007    Earlie Counts, PT 01/21/2016 9:28 AM   [facility] [address], [place], [place], [address] Phone: [phone]   Fax:  [phone]  Name: [name] [name] MRN: [id number] Date of Birth: 01/21/1941

## 2016-01-06 NOTE — Patient Instructions (Addendum)
Quick Contraction: Gravity Resisted (Sitting)    Sitting, quickly squeeze then fully relax pelvic floor. Perform _1__ sets of _10__. Rest for _1__ seconds between sets. Do _3__ times a day.  Copyright  VHI. All rights reserved.  Slow Contraction: Gravity Resisted (Sitting)    Sitting, slowly squeeze pelvic floor for _8__ seconds. Rest for _5__ seconds. Repeat _10__ times. Do _3__ times a day.  Copyright  VHI. All rights reserved.  About Rectocele Overview A rectocele is a type of hernia which causes different degrees of bulging of the rectal tissues into the vaginal wall. You may even notice that it presses against the vaginal wall so much that some vaginal tissues droop outside of the opening of your vagina.  Causes of Rectocele The most common cause is childbirth. The muscles and ligaments in the pelvis that hold up and support the female organs and vagina become stretched and weakened during labor and delivery. The more babies you have, the more the support tissues are stretched and weakened. Not everyone who has a baby will develop a rectocele. Some women have stronger supporting tissue in the pelvis and may not have as much of a problem as others. Women who have a Cesarean section usually do not get rectoceles unless they pushed a long time prior to the cesarean delivery.  Other conditions that can cause a rectocele include chronic constipation, a chronic cough, a lot of heavy lifting, and obesity. Older women may have this problem because the loss of female hormones causes the vaginal tissue to become weaker.  Symptoms There may not be any symptoms. If you do have symptoms, they may include:  . pelvic pressure in the rectal area  . protrusion of the lower part of the vagina through the opening of the vagina  . constipation and trapping of the stool, making it difficult to have a bowel movement. In severe cases, you may have to press on the lower part of your vagina to help push the  stool out of your rectum.  This is called splinting to empty. Diagnosing Rectocele Your health care provider will ask about your symptoms and perform a pelvic exam. S/he will ask you to bear down, pushing like you are having a bowel movement so as to see how far the lower part of the vagina protrudes into the vagina and possibly outside of the vagina. Your provider will also ask you to contract the muscles of your pelvis (like you are stopping the stream in the middle of urinating) to determine the strength of your pelvic muscles. Your provider may also do a rectal exam.   [facility] [address] [place],  [address] Phone # [phone] Fax [phone] Earlie Counts, PT

## 2016-01-14 NOTE — Op Note (Signed)
Anorectal Manometry Interpretation / Findings  Low anal sphincter resting and squeeze pressures  Incomplete relaxation of the anal sphincter with low intra rectal pressure generation during attempted defecation  Rectoanal inhibitory reflex present  Abnormal rectal sensation with very low volume for urge to defecate and maximal tolerable volume Abnormal balloon expulsion test; balloon was not expelled in 2 minutes and patient experienced discomfort  Impression and Recommendations Weak internal and external anal sphincter  Abnormal rectal sensation suggestive of rectal hypersensitivity Evidence of dyssynergia defecation  Consider referral to pelvic floor PT for biofeedback  K.Denzil Magnuson, MD [facility]

## 2016-01-15 ENCOUNTER — Ambulatory Visit
Admission: RE | Admit: 2016-01-15 | Discharge: 2016-01-15 | Disposition: A | Discharge status: Home / Self Care | Payer: Medicare Other | Source: Ambulatory Visit | Attending: Family Medicine | Admitting: Family Medicine

## 2016-01-15 DIAGNOSIS — Z23 Encounter for immunization: Secondary | ICD-10-CM | POA: Diagnosis not present

## 2016-01-15 DIAGNOSIS — Z1231 Encounter for screening mammogram for malignant neoplasm of breast: Secondary | ICD-10-CM | POA: Diagnosis not present

## 2016-01-20 ENCOUNTER — Ambulatory Visit: Payer: Medicare Other | Admitting: Physical Therapy

## 2016-01-21 ENCOUNTER — Telehealth: Payer: Self-pay | Admitting: Cardiology

## 2016-01-21 MED ORDER — METOPROLOL TARTRATE 50 MG PO TABS: 50.0000 mg | ORAL_TABLET | Freq: Two times a day (BID) | ORAL | 3 refills | Status: DC

## 2016-01-21 NOTE — Telephone Encounter (Signed)
Notified the pt that per Dr Meda Coffee, we will switch her to metoprolol 50 mg po BID.  Discontinued the pts atenolol in the system.  Confirmed the pharmacy of choice with the pt.  Pt verbalized understanding and agrees with this plan.

## 2016-01-21 NOTE — Telephone Encounter (Signed)
Dr Meda Coffee, this pt was on atenolol and there is still a shortage on this medication.  She was on atenolol 25 mg po daily.  Can you please advise on a different regimen for this pt?

## 2016-01-21 NOTE — Telephone Encounter (Signed)
New message       Pt c/o medication issue:  1. Name of Medication: atenolol  2. How are you currently taking this medication (dosage and times per day)? 25mg  3. Are you having a reaction (difficulty breathing--STAT)? no  4. What is your medication issue? Manufacture back order---50mg  is not available also.  What else can the pt take?

## 2016-01-21 NOTE — Telephone Encounter (Signed)
Please switch to metoprolol 50 mg po BID

## 2016-01-29 ENCOUNTER — Encounter: Payer: Medicare Other | Admitting: Physical Therapy

## 2016-02-05 ENCOUNTER — Ambulatory Visit: Payer: Medicare Other | Attending: Internal Medicine | Admitting: Physical Therapy

## 2016-02-05 ENCOUNTER — Encounter: Payer: Self-pay | Admitting: Physical Therapy

## 2016-02-05 DIAGNOSIS — R279 Unspecified lack of coordination: Secondary | ICD-10-CM | POA: Insufficient documentation

## 2016-02-05 DIAGNOSIS — M6281 Muscle weakness (generalized): Secondary | ICD-10-CM | POA: Diagnosis not present

## 2016-02-05 NOTE — Therapy (Addendum)
[facility] [address] [place], [place], [address] Phone: [phone]   Fax:  [phone]  Physical Therapy Treatment  Patient Details  Name: [name] [name] MRN: [id number] Date of Birth: 02-Jun-1940 Referring Provider: Dr. Silvano Rusk  Encounter Date: 02/05/2016      PT End of Session - 02/26/16 1018    Visit Number 2   Number of Visits 10   Date for PT Re-Evaluation 03/02/16   Authorization Type medicare 10th visit g-code   PT Start Time 1015   PT Stop Time 1043   PT Time Calculation (min) 28 min   Activity Tolerance Patient tolerated treatment well   Behavior During Therapy [facility] for tasks assessed/performed      Past Medical History:  Diagnosis Date  . Anal sphincter incompetence 12/31/2015   Manometry 12/2015  . Brady-tachy syndrome ([place])   . Chest pain 10/16/13   normal coronary arteries on cath and normal EF  . Colitis, ischemic ([place])   . Colon polyp 07/29/1993   with focal adenomatous changes  . Diverticulitis   . Diverticulosis of colon   . Dyspepsia   . Dyssynergic defecation 12/31/2015   Anorectal mano 12/2015, also has rectal hypersensitivity  . Fatty liver   . HLD (hyperlipidemia)   . Hypertension   . IBS (irritable bowel syndrome)   . Incontinence, feces   . MVP (mitral valve prolapse)   . nhl dx'd 07/1999   chemo comp 2001; rituxin comp 2005  . Osteopenia   . Palpitation   . Rectocele   . Rectocele   . Vasovagal syncope     Past Surgical History:  Procedure Laterality Date  . ABDOMINAL HYSTERECTOMY    . ABDOMINAL SURGERY  2001    for non hodgkins lymphoma; lymphoma in mesentary  . ANAL RECTAL MANOMETRY N/A 12/24/2015   Procedure: ANO RECTAL MANOMETRY;  Surgeon: Gatha Mayer, MD;  Location: [facility];  Service: Endoscopy;  Laterality: N/A;  . BLADDER SUSPENSION    . BUNIONECTOMY    . CHOLECYSTECTOMY  2003  . COLONOSCOPY  7/200/, 11/2007   diverticulosis  . LEFT HEART  CATHETERIZATION WITH CORONARY ANGIOGRAM N/A 10/16/2013   Procedure: LEFT HEART CATHETERIZATION WITH CORONARY ANGIOGRAM;  Surgeon: Peter M Martinique, MD;  Location: [facility] CATH LAB;  Service: Cardiovascular;  Laterality: N/A;  . pelvic prolapse repair    . UPPER GASTROINTESTINAL ENDOSCOPY  10/2005   hiatal hernia    There were no vitals filed for this visit.      Subjective Assessment - 02/26/16 1017    Subjective I am a happy patient.  I have not had any of the bad episodes.  I have been on 2 day trips without episodes.    Patient Stated Goals have bowel movement without having to go again in 1 hour   Currently in Pain? No/denies            [facility] PT Assessment - 02/26/16 0001      Assessment   Medical Diagnosis N81.84 Pelvic floor dysfunction   Referring Provider Dr. Silvano Rusk   Onset Date/Surgical Date 06/04/15   Prior Therapy None     Precautions   Precautions Other (comment)   Precaution Comments No joint mobilization, no trunk flexion     Restrictions   Weight Bearing Restrictions No     Home Environment   Living Environment Private residence     Prior Function   Level of [place]  3 mile walk, cardio aerobics 30 min, handweights 30 min, yoga     Cognition   Overall Cognitive Status Within Functional Limits for tasks assessed     Observation/Other Assessments   Focus on Therapeutic Outcomes (FOTO)  Therapist discretion 5% limitation     Posture/Postural Control   Posture/Postural Control No significant limitations     PROM   Overall PROM Comments bil. hip ER is limited     Strength   Overall Strength Comments bil. hip abduction and extension 4/5                  Pelvic Floor Special Questions - March 26, 2016 0001    Currently Sexually Active No   Urinary Leakage No   Fecal incontinence No   Skin Integrity Intact                   PT Education - 03-26-16 1035    Education provided Yes   Education Details core  stabilization; reviewed past HEP   Person(s) Educated Patient   Methods Explanation;Demonstration;Handout   Comprehension Returned demonstration;Verbalized understanding          PT Short Term Goals - 02/05/16 1057      PT SHORT TERM GOAL #1   Title understand correct toileting technique to relax the pelvic floor correctly   Time 4   Period Weeks   Status Achieved     PT SHORT TERM GOAL #2   Title understand bowel health   Time 4   Period Weeks   Status Achieved     PT SHORT TERM GOAL #3   Title understand how to do abdominal massage to assist in bowel movements   Time 4   Period Weeks   Status Achieved           PT Long Term Goals - 03-26-2016 1019      PT LONG TERM GOAL #1   Title independent with pelvic floor strengthening program   Time 8   Period Weeks   Status Achieved     PT LONG TERM GOAL #2   Title ability to have a bowel movement and not have to go back in 1 hour to complete   Time 8   Period Weeks   Status Achieved     PT LONG TERM GOAL #3   Title urge to have a bowel movement decreased >/= 25% due to increased pelvic floor strength   Time 8   Period Weeks   Status Achieved               Plan - 26-Mar-2016 1036    Clinical Impression Statement Patient has met all of her goals.  She is not having difficulty going to the bathroom. She is indepedent with her HEP.  Patient is ready for discharge.    Rehab Potential Excellent   Clinical Impairments Affecting Rehab Potential None   PT Frequency 1x / week   PT Duration 8 weeks   PT Treatment/Interventions Biofeedback;Therapeutic activities;Therapeutic exercise;Neuromuscular re-education;Patient/family education;Manual techniques   PT Next Visit Plan Discharge to HEP this visit   PT Home Exercise Plan Current HEP   Consulted and Agree with Plan of Care Patient      Patient will benefit from skilled therapeutic intervention in order to improve the following deficits and impairments:  Decreased  strength, Decreased coordination  Visit Diagnosis: Muscle weakness (generalized)  Unspecified lack of coordination       G-Codes - March 26, 2016 1043  Functional Assessment Tool Used Therapist discretion 5% limitation   Functional Limitation Other PT primary   Other PT Primary Goal Status [phone]) At least 1 percent but less than 20 percent impaired, limited or restricted   Other PT Primary Discharge Status [phone]) At least 1 percent but less than 20 percent impaired, limited or restricted      Problem List Patient Active Problem List   Diagnosis Date Noted  . Constipation due to outlet dysfunction   . Fecal incontinence   . Anal sphincter incompetence 12/31/2015  . Dyssynergic defecation 12/31/2015  . Heart palpitations 07/02/2015  . Lipoma of arm 06/30/2015  . HTN (hypertension) 04/18/2015  . Dizziness 04/18/2015  . Ocular hypertension 11/19/2014  . Dry eye syndrome 11/19/2014  . Glaucoma suspect 11/19/2014  . Disorder of macula of retina 11/19/2014  . DOE (dyspnea on exertion) 11/27/2013  . NSVT (nonsustained ventricular tachycardia) ([place]) 11/27/2013  . Abdominal pain, unspecified site 11/07/2013  . Tachycardia 10/30/2013  . Dyspnea on exertion 10/01/2013  . Allergic rhinitis 08/20/2013  . Diverticulitis of colon 01/08/2011  . Chest pain on exertion 10/01/2010  . HYPERLIPIDEMIA-MIXED 08/27/2008  . MVP (mitral valve prolapse) 08/27/2008  . DYSPEPSIA 10/13/2007  .  IBS- diarrhea predominant 10/13/2007  . DIVERTICULOSIS OF COLON 06/16/2007  . OSTEOPENIA 06/16/2007  . NHL (non-Hodgkin's lymphoma) ([place]) 06/16/2007    Earlie Counts, PT 02/26/16 10:47 AM   [facility] [address] [place], [place], [address] Phone: [phone]   Fax:  [phone]  Name: [name] [name] MRN: [id number] Date of Birth: 04-Jan-1941

## 2016-02-05 NOTE — Patient Instructions (Signed)
Toileting Techniques for Bowel Movements (Defecation) Using your belly (abdomen) and pelvic floor muscles to have a bowel movement is usually instinctive.  Sometimes people can have problems with these muscles and have to relearn proper defecation (emptying) techniques.  If you have weakness in your muscles, organs that are falling out, decreased sensation in your pelvis, or ignore your urge to go, you may find yourself straining to have a bowel movement.  You are straining if you are: . holding your breath or taking in a huge gulp of air and holding it  . keeping your lips and jaw tensed and closed tightly . turning red in the face because of excessive pushing or forcing . developing or worsening your  hemorrhoids . getting faint while pushing . not emptying completely and have to defecate many times a day  If you are straining, you are actually making it harder for yourself to have a bowel movement.  Many people find they are pulling up with the pelvic floor muscles and closing off instead of opening the anus. Due to lack pelvic floor relaxation and coordination the abdominal muscles, one has to work harder to push the feces out.  Many people have never been taught how to defecate efficiently and effectively.  Notice what happens to your body when you are having a bowel movement.  While you are sitting on the toilet pay attention to the following areas: . Jaw and mouth position . Angle of your hips   . Whether your feet touch the ground or not . Arm placement  . Spine position . Waist . Belly tension . Anus (opening of the anal canal)  An Evacuation/Defecation Plan   Here are the 4 basic points:  1. Lean forward enough for your elbows to rest on your knees 2. Support your feet on the floor or use a low stool if your feet don't touch the floor  3. Push out your belly as if you have swallowed a beach ball-you should feel a widening of your waist 4. Open and relax your pelvic floor muscles,  rather than tightening around the anus      The following conditions my require modifications to your toileting posture:  . If you have had surgery in the past that limits your back, hip, pelvic, knee or ankle flexibility . Constipation   Your healthcare practitioner may make the following additional suggestions and adjustments:  1) Sit on the toilet  a) Make sure your feet are supported. b) Notice your hip angle and spine position-most people find it effective to lean forward or raise their knees, which can help the muscles around the anus to relax  c) When you lean forward, place your forearms on your thighs for support  2) Relax suggestions a) Breath deeply in through your nose and out slowly through your mouth as if you are smelling the flowers and blowing out the candles. b) To become aware of how to relax your muscles, contracting and releasing muscles can be helpful.  Pull your pelvic floor muscles in tightly by using the image of holding back gas, or closing around the anus (visualize making a circle smaller) and lifting the anus up and in.  Then release the muscles and your anus should drop down and feel open. Repeat 5 times ending with the feeling of relaxation. c) Keep your pelvic floor muscles relaxed; let your belly bulge out. d) The digestive tract starts at the mouth and ends at the anal opening, so be   sure to relax both ends of the tube.  Place your tongue on the roof of your mouth with your teeth separated.  This helps relax your mouth and will help to relax the anus at the same time.  3) Empty (defecation) a) Keep your pelvic floor and sphincter relaxed, then bulge your anal muscles.  Make the anal opening wide.  b) Stick your belly out as if you have swallowed a beach ball. c) Make your belly wall hard using your belly muscles while continuing to breathe. Doing this makes it easier to open your anus. d) Breath out and give a grunt (or try using other sounds such as  ahhhh, shhhhh, ohhhh or grrrrrrr).  4) Finish a) As you finish your bowel movement, pull the pelvic floor muscles up and in.  This will leave your anus in the proper place rather than remaining pushed out and down. If you leave your anus pushed out and down, it will start to feel as though that is normal and give you incorrect signals about needing to have a bowel movement.    [name] [name], PT [facility] [address] [place], [place] [address] Sitting    Sit comfortably. Allow body's muscles to relax. Place hands on belly. Inhale slowly and deeply for __3_ seconds, so hands move out. Then take _3__ seconds to exhale. Repeat _5__ times. Do _2__ times a day. Or when having a bowel movement.  Copyright  VHI. All rights reserved.  Introduction to Bowel Health Diet and daily habits can help you predict when your bowels will move on a regular basis.  The consistency and quantity of the stool is usually more important than the frequency.  The goal is to have a regular bowel movement that is soft but formed.   Tips on Emptying Regularly . Eat breakfast.  Usually the best time of day for a bowel movement will be a half hour to an hour after eating.  These times are best because the body uses the gastrocolic reflex, a stimulation of bowel motion that occurs with eating, to help produce a bowel movement.  For some people even a simple hot drink in the morning can help the reflex action begin. . Eat all your meals at a predictable time each day.  The bowel functions best when food is introduced at the same regular intervals. . The amount of food eaten at a given time of day should be about the same size from day to day.  The bowel functions best when food is introduced in similar quantities from day to day. It is fine to have a small breakfast and a large lunch, or vice versa, just be consistent. . Eat two servings of fruit or vegetables and at least one serving of a  complex carbohydrates (whole grains such as brown rice, bran, whole wheat bread, or oatmeal) at each meal. . Drink plenty of water-ideally eight glasses a day.  Be sure to increase your water intake if you are increasing fiber into your diet.  Maintain Healthy Habits . Exercise daily.  You may exercise at any time of day, but you may find that bowel function is helped most if the exercise is at a consistent time each day. . Make sure that you are not rushed and have convenient access to a bathroom at your selected time to empty your bowels.   About Abdominal Massage  Abdominal massage, also called external colon massage, is a self-treatment circular massage technique  that can reduce and eliminate gas and ease constipation. The colon naturally contracts in waves in a clockwise direction starting from inside the right hip, moving up toward the ribs, across the belly, and down inside the left hip.  When you perform circular abdominal massage, you help stimulate your colon's normal wave pattern of movement called peristalsis.  It is most beneficial when done after eating.  Positioning You can practice abdominal massage with oil while lying down, or in the shower with soap.  Some people find that it is just as effective to do the massage through clothing while sitting or standing.  How to Massage Start by placing your finger tips or knuckles on your right side, just inside your hip bone.  . Make small circular movements while you move upward toward your rib cage.   . Once you reach the bottom right side of your rib cage, take your circular movements across to the left side of the bottom of your rib cage.  . Next, move downward until you reach the inside of your left hip bone.  This is the path your feces travel in your colon. . Continue to perform your abdominal massage in this pattern for 10 minutes each day.     You can apply as much pressure as is comfortable in your massage.  Start gently and  build pressure as you continue to practice.  Notice any areas of pain as you massage; areas of slight pain may be relieved as you massage, but if you have areas of significant or intense pain, consult with your healthcare provider.  Other Considerations . General physical activity including bending and stretching can have a beneficial massage-like effect on the colon.  Deep breathing can also stimulate the colon because breathing deeply activates the same nervous system that supplies the colon.   . Abdominal massage should always be used in combination with a bowel-conscious diet that is high in the proper type of fiber for you, fluids (primarily water), and a regular exercise program. [facility] [address], [place], [place] [address] Phone # [phone] Fax [phone]

## 2016-02-26 ENCOUNTER — Encounter: Payer: Self-pay | Admitting: Physical Therapy

## 2016-02-26 ENCOUNTER — Ambulatory Visit: Payer: Medicare Other | Admitting: Physical Therapy

## 2016-02-26 DIAGNOSIS — R279 Unspecified lack of coordination: Secondary | ICD-10-CM | POA: Diagnosis not present

## 2016-02-26 DIAGNOSIS — M6281 Muscle weakness (generalized): Secondary | ICD-10-CM

## 2016-02-26 NOTE — Patient Instructions (Signed)
Lower abdominal/core stability exercises  1. Practice your breathing technique: Inhale through your nose expanding your belly and rib cage. Try not to breathe into your chest. Exhale slowly and gradually out your mouth feeling a sense of softness to your body. Practice multiple times. This can be performed unlimited.  2. Finding the lower abdominals. Laying on your back with the knees bent, place your fingers just below your belly button. Using your breathing technique from above, on your exhale gently pull the belly button away from your fingertips without tensing any other muscles. Practice this 5x. Next, as you exhale, draw belly button inwards and hold onto it...then feel as if you are pulling that muscle across your pelvis like you are tightening a belt. This can be hard to do at first so be patient and practice. Do 5-10 reps 1-3 x day. Always recognize quality over quantity; if your abdominal muscles become tired you will notice you may tighten/contract other muscles. This is the time to take a break.   Practice this first laying on your back, then in sitting, progressing to standing and finally adding it to all your daily movements.   3. Finding your pelvic floor. Using the breathing technique above, when your exhale, this time draw your pelvic floor muscles up as if you were attempting to stop the flow of urination. Be careful NOT to tense any other muscles. This can be hard, BE PATIENT. Try to hold up to 10 seconds repeating 5x. Try 2x a day. Once you feel you are doing this well, add this contraction to exercise #2. First contracting your pelvic floor followed by lower abdominals.   4. Adding leg movements. Add the following leg movements to challenge your ability to keep your core stable:  1. Single leg drop outs: Laying on your back with knees bent feet flat. Inhale,  dropping one knee outward KEEPING YOUR PELVIS STILL. Exhale as you bring the leg back, simultaneously performing your lower  abdominal contraction. Do 5-10 on each leg.   2. Marching: While keeping your pelvis still, lift the right foot a few inches, put it down then lift left foot. This will mimic a march. Start slow to establish control. Once you have control you may speed it up. Do 10-20x. You MUST keep your lower abdominlas contracted while you march. Breathe naturally    3. Single leg slides: Inhale while you slowly slide one leg out keeping your pelvis still. Only slide your leg as far as you can keep your pelvis still. Exhale as you bring the leg back to the start, contracting the lower abdominals as you do that. Keep your upper body relaxed. Do 5-10 on each side.        [facility] [address], [place], [place] [address] Phone # [phone] Fax [phone]

## 2016-02-26 NOTE — Therapy (Addendum)
[facility] [address], [place], [place], [address] Phone: [phone]   Fax:  [phone]  Physical Therapy Treatment  Patient Details  Name: [name] [name] MRN: [id number] Date of Birth: 11/03/1940 Referring Provider: Dr. Silvano Rusk  Encounter Date: 02/26/2016      PT End of Session - 02/26/16 1018    Visit Number 3   Number of Visits 10   Date for PT Re-Evaluation 03/02/16   Authorization Type medicare 10th visit g-code   PT Start Time 1015   PT Stop Time 1043   PT Time Calculation (min) 28 min   Activity Tolerance Patient tolerated treatment well   Behavior During Therapy [facility] for tasks assessed/performed      Past Medical History:  Diagnosis Date  . Anal sphincter incompetence 12/31/2015   Manometry 12/2015  . Brady-tachy syndrome ([place])   . Chest pain 10/16/13   normal coronary arteries on cath and normal EF  . Colitis, ischemic ([place])   . Colon polyp 07/29/1993   with focal adenomatous changes  . Diverticulitis   . Diverticulosis of colon   . Dyspepsia   . Dyssynergic defecation 12/31/2015   Anorectal mano 12/2015, also has rectal hypersensitivity  . Fatty liver   . HLD (hyperlipidemia)   . Hypertension   . IBS (irritable bowel syndrome)   . Incontinence, feces   . MVP (mitral valve prolapse)   . nhl dx'd 07/1999   chemo comp 2001; rituxin comp 2005  . Osteopenia   . Palpitation   . Rectocele   . Rectocele   . Vasovagal syncope     Past Surgical History:  Procedure Laterality Date  . ABDOMINAL HYSTERECTOMY    . ABDOMINAL SURGERY  2001    for non hodgkins lymphoma; lymphoma in mesentary  . ANAL RECTAL MANOMETRY N/A 12/24/2015   Procedure: ANO RECTAL MANOMETRY;  Surgeon: Gatha Mayer, MD;  Location: [facility];  Service: Endoscopy;  Laterality: N/A;  . BLADDER SUSPENSION    . BUNIONECTOMY    . CHOLECYSTECTOMY  2003  . COLONOSCOPY  7/200/, 11/2007   diverticulosis  . LEFT HEART  CATHETERIZATION WITH CORONARY ANGIOGRAM N/A 10/16/2013   Procedure: LEFT HEART CATHETERIZATION WITH CORONARY ANGIOGRAM;  Surgeon: Peter M Martinique, MD;  Location: [facility] CATH LAB;  Service: Cardiovascular;  Laterality: N/A;  . pelvic prolapse repair    . UPPER GASTROINTESTINAL ENDOSCOPY  10/2005   hiatal hernia    There were no vitals filed for this visit.      Subjective Assessment - 02/26/16 1017    Subjective I am a happy patient.  I have not had any of the bad episodes.  I have been on 2 day trips without episodes.    Patient Stated Goals have bowel movement without having to go again in 1 hour   Currently in Pain? No/denies            [facility] PT Assessment - 02/26/16 0001      Assessment   Medical Diagnosis N81.84 Pelvic floor dysfunction   Referring Provider Dr. Silvano Rusk   Onset Date/Surgical Date 06/04/15   Prior Therapy None     Precautions   Precautions Other (comment)   Precaution Comments No joint mobilization, no trunk flexion     Restrictions   Weight Bearing Restrictions No     Home Environment   Living Environment Private residence     Prior Function   Level of [place]  3 mile walk, cardio aerobics 30 min, handweights 30 min, yoga     Cognition   Overall Cognitive Status Within Functional Limits for tasks assessed     Observation/Other Assessments   Focus on Therapeutic Outcomes (FOTO)  Therapist discretion 5% limitation     Posture/Postural Control   Posture/Postural Control No significant limitations     PROM   Overall PROM Comments bil. hip ER is limited     Strength   Overall Strength Comments bil. hip abduction and extension 4/5                  Pelvic Floor Special Questions - March 26, 2016 0001    Currently Sexually Active No   Urinary Leakage No   Fecal incontinence No   Skin Integrity Intact                   PT Education - 03-26-16 1035    Education provided Yes   Education Details core  stabilization; reviewed past HEP   Person(s) Educated Patient   Methods Explanation;Demonstration;Handout   Comprehension Returned demonstration;Verbalized understanding          PT Short Term Goals - 02/05/16 1057      PT SHORT TERM GOAL #1   Title understand correct toileting technique to relax the pelvic floor correctly   Time 4   Period Weeks   Status Achieved     PT SHORT TERM GOAL #2   Title understand bowel health   Time 4   Period Weeks   Status Achieved     PT SHORT TERM GOAL #3   Title understand how to do abdominal massage to assist in bowel movements   Time 4   Period Weeks   Status Achieved           PT Long Term Goals - 03-26-2016 1019      PT LONG TERM GOAL #1   Title independent with pelvic floor strengthening program   Time 8   Period Weeks   Status Achieved     PT LONG TERM GOAL #2   Title ability to have a bowel movement and not have to go back in 1 hour to complete   Time 8   Period Weeks   Status Achieved     PT LONG TERM GOAL #3   Title urge to have a bowel movement decreased >/= 25% due to increased pelvic floor strength   Time 8   Period Weeks   Status Achieved               Plan - 26-Mar-2016 1036    Clinical Impression Statement Patient has met all of her goals.  She is not having difficulty going to the bathroom. She is indepedent with her HEP.  Patient is ready for discharge.    Rehab Potential Excellent   Clinical Impairments Affecting Rehab Potential None   PT Frequency 1x / week   PT Duration 8 weeks   PT Treatment/Interventions Biofeedback;Therapeutic activities;Therapeutic exercise;Neuromuscular re-education;Patient/family education;Manual techniques   PT Next Visit Plan Discharge to HEP this visit   PT Home Exercise Plan Current HEP   Consulted and Agree with Plan of Care Patient      Patient will benefit from skilled therapeutic intervention in order to improve the following deficits and impairments:  Decreased  strength, Decreased coordination  Visit Diagnosis: Muscle weakness (generalized)  Unspecified lack of coordination       G-Codes - March 26, 2016 1043  Functional Assessment Tool Used Therapist discretion 5% limitation   Functional Limitation Other PT primary   Other PT Primary Goal Status [phone]) At least 1 percent but less than 20 percent impaired, limited or restricted   Other PT Primary Discharge Status [phone]) At least 1 percent but less than 20 percent impaired, limited or restricted      Problem List Patient Active Problem List   Diagnosis Date Noted  . Constipation due to outlet dysfunction   . Fecal incontinence   . Anal sphincter incompetence 12/31/2015  . Dyssynergic defecation 12/31/2015  . Heart palpitations 07/02/2015  . Lipoma of arm 06/30/2015  . HTN (hypertension) 04/18/2015  . Dizziness 04/18/2015  . Ocular hypertension 11/19/2014  . Dry eye syndrome 11/19/2014  . Glaucoma suspect 11/19/2014  . Disorder of macula of retina 11/19/2014  . DOE (dyspnea on exertion) 11/27/2013  . NSVT (nonsustained ventricular tachycardia) ([place]) 11/27/2013  . Abdominal pain, unspecified site 11/07/2013  . Tachycardia 10/30/2013  . Dyspnea on exertion 10/01/2013  . Allergic rhinitis 08/20/2013  . Diverticulitis of colon 01/08/2011  . Chest pain on exertion 10/01/2010  . HYPERLIPIDEMIA-MIXED 08/27/2008  . MVP (mitral valve prolapse) 08/27/2008  . DYSPEPSIA 10/13/2007  .  IBS- diarrhea predominant 10/13/2007  . DIVERTICULOSIS OF COLON 06/16/2007  . OSTEOPENIA 06/16/2007  . NHL (non-Hodgkin's lymphoma) ([place]) 06/16/2007    Earlie Counts, PT 02/26/16 10:49 AM   [facility] [address] [place], [place], [address] Phone: [phone]   Fax:  [phone]  Name: [name] [name] MRN: [id number] Date of Birth: 07/16/1940  PHYSICAL THERAPY DISCHARGE SUMMARY  Visits from Start of Care: 3  Current functional  level related to goals / functional outcomes: See above   Remaining deficits: See above.   Education / Equipment: HEP Plan: Patient agrees to discharge.  Patient goals were met. Patient is being discharged due to meeting the stated rehab goals. Thank you for the referral. Earlie Counts, PT 02/26/16 10:49 AM   ?????

## 2016-03-03 DIAGNOSIS — M76892 Other specified enthesopathies of left lower limb, excluding foot: Secondary | ICD-10-CM | POA: Diagnosis not present

## 2016-03-03 DIAGNOSIS — M5416 Radiculopathy, lumbar region: Secondary | ICD-10-CM | POA: Diagnosis not present

## 2016-03-03 DIAGNOSIS — M5136 Other intervertebral disc degeneration, lumbar region: Secondary | ICD-10-CM | POA: Diagnosis not present

## 2016-03-30 DIAGNOSIS — G245 Blepharospasm: Secondary | ICD-10-CM | POA: Diagnosis not present

## 2016-03-30 DIAGNOSIS — H40003 Preglaucoma, unspecified, bilateral: Secondary | ICD-10-CM | POA: Diagnosis not present

## 2016-04-08 DIAGNOSIS — G245 Blepharospasm: Secondary | ICD-10-CM | POA: Diagnosis not present

## 2016-05-26 DIAGNOSIS — G245 Blepharospasm: Secondary | ICD-10-CM | POA: Diagnosis not present

## 2016-06-10 DIAGNOSIS — H40003 Preglaucoma, unspecified, bilateral: Secondary | ICD-10-CM | POA: Diagnosis not present

## 2016-06-10 DIAGNOSIS — Z83511 Family history of glaucoma: Secondary | ICD-10-CM | POA: Diagnosis not present

## 2016-06-10 DIAGNOSIS — H40053 Ocular hypertension, bilateral: Secondary | ICD-10-CM | POA: Diagnosis not present

## 2016-06-23 DIAGNOSIS — M79675 Pain in left toe(s): Secondary | ICD-10-CM | POA: Diagnosis not present

## 2016-06-23 DIAGNOSIS — L600 Ingrowing nail: Secondary | ICD-10-CM | POA: Diagnosis not present

## 2016-07-19 DIAGNOSIS — L814 Other melanin hyperpigmentation: Secondary | ICD-10-CM | POA: Diagnosis not present

## 2016-07-19 DIAGNOSIS — D692 Other nonthrombocytopenic purpura: Secondary | ICD-10-CM | POA: Diagnosis not present

## 2016-07-19 DIAGNOSIS — I872 Venous insufficiency (chronic) (peripheral): Secondary | ICD-10-CM | POA: Diagnosis not present

## 2016-07-20 ENCOUNTER — Ambulatory Visit (INDEPENDENT_AMBULATORY_CARE_PROVIDER_SITE_OTHER): Payer: Medicare Other | Admitting: Internal Medicine

## 2016-07-20 ENCOUNTER — Encounter: Payer: Self-pay | Admitting: Internal Medicine

## 2016-07-20 VITALS — BP 138/78 | HR 72 | Resp 16 | Ht 63.0 in | Wt 131.0 lb

## 2016-07-20 DIAGNOSIS — K219 Gastro-esophageal reflux disease without esophagitis: Secondary | ICD-10-CM | POA: Diagnosis not present

## 2016-07-20 DIAGNOSIS — K582 Mixed irritable bowel syndrome: Secondary | ICD-10-CM | POA: Diagnosis not present

## 2016-07-20 DIAGNOSIS — R1012 Left upper quadrant pain: Secondary | ICD-10-CM

## 2016-07-20 MED ORDER — PANTOPRAZOLE SODIUM 40 MG PO TBEC: 40.0000 mg | DELAYED_RELEASE_TABLET | Freq: Every day | ORAL | 3 refills | Status: DC

## 2016-07-20 NOTE — Progress Notes (Signed)
   [name] [name] 76 y.o. [birth year] [id number]  Assessment & Plan:   Encounter Diagnoses  Name Primary?  . Irritable bowel syndrome with both constipation and diarrhea Yes  . Gastroesophageal reflux disease, esophagitis presence not specified   . LUQ pain      Change esomeprazole 20 to pantoprazole 40 mg qd  High fiber diet + Benefiber uo to 3 tbsp/day Add MiraLax prn   I think most of this is a functional issue, I could not detect any musculoskeletal symptoms that would cause her left upper quadrant discomfort, will see what the PPI does. Overall low index of suspicion for anything bad. RTC 2 mos  CC: Dr. Wolfgang Phoenix    Subjective:   Chief Complaint:  HPI Overall [name] [name] is improved. Pelvic floor physical therapy has allowed her to have less urge incontinence issues and for the most part she only gets urgency about 2 out of every 3 bowel movements and feels like she can control it better. She is still having alternating hard small balls of stool with multiple voluminous stools. She'll feel constipated somewhat and then have a day where she unloads multiple stools. Then she will move her bowels much and she'll have to push out small balls of stool at times. Occasionally there is some mucus and slight rectal bleeding. Her last colonoscopy was in 2014 which showed diverticulosis. Considering repeat colonoscopy 2019, elderly father had colon cancer. He is also having some epigastric and midsternal burning that radiates up to her neck at times related to eating. If she strays from diet that may bother her. There is also a vague sensation of a pressure or fullness in the left upper quadrant worse if she lies on that side etc. or sits sometimes. It is not associated with eating or anything like that. Wt Readings from Last 3 Encounters:  07/20/16 59.4 kg (131 lb)  12/01/15 58.1 kg (128 lb)  11/18/15 58.2 kg (128 lb 3.2 oz)   Medications, allergies, past medical history, past  surgical history, family history and social history are reviewed and updated in the EMR.  Review of Systems As above. Improved regarding grieving after husband's death. Is having some trouble cooking for 1 and thinks she may not have eaten is healthy as she can.  Objective:   Physical Exam @BP  138/78   Pulse 72   Resp 16   Ht 5\' 3"  (1.6 m)   Wt 59.4 kg (131 lb)   BMI 23.21 kg/m @  General:  NAD Eyes:   anicteric Lungs:  clear Heart::  S1S2 no rubs, murmurs or gallops Abdomen:  soft and nontender, BS+ Ext:   no edema, cyanosis or clubbing    Data Reviewed:    Previous GI notes and physical therapy notes

## 2016-07-20 NOTE — Patient Instructions (Addendum)
Today we are giving you high fiber diet handout to read and follow. (mailing to her)   Today we are giving you a handout on benefiber: start with 1 Tablespoon and ramp up to 3 tablespoons daily.  If no results add miralax, coupon provided.  We have sent the following medications to your pharmacy for you to pick up at your convenience: Pantoprazole  Follow up with Dr Carlean Purl in 2 months.   I appreciate the opportunity to care for you. Silvano Rusk, MD, [facility]

## 2016-07-27 DIAGNOSIS — G245 Blepharospasm: Secondary | ICD-10-CM | POA: Diagnosis not present

## 2016-08-05 ENCOUNTER — Telehealth: Payer: Self-pay | Admitting: Family Medicine

## 2016-08-05 MED ORDER — DIPHENOXYLATE-ATROPINE 2.5-0.025 MG PO TABS: 1.0000 | ORAL_TABLET | Freq: Four times a day (QID) | ORAL | 0 refills | Status: DC | PRN

## 2016-08-05 NOTE — Telephone Encounter (Signed)
Spoke with patient and patient has c/o diarrhea, had some nausea but nausea has resolved. Denies fever and abdominal pain. Has tried over the counter Imodium. Would like a prescription called in for Lomotil. Please advise?

## 2016-08-05 NOTE — Telephone Encounter (Signed)
Pt is requesting lamodal to be called in. Pt thinks she has a stomach bug.    [facility]

## 2016-08-05 NOTE — Telephone Encounter (Signed)
Lomotil 24 one q six hrs prn

## 2016-08-05 NOTE — Telephone Encounter (Signed)
Med sent to pharm 

## 2016-08-06 ENCOUNTER — Ambulatory Visit (INDEPENDENT_AMBULATORY_CARE_PROVIDER_SITE_OTHER): Payer: Medicare Other | Admitting: Family Medicine

## 2016-08-06 ENCOUNTER — Encounter: Payer: Self-pay | Admitting: Family Medicine

## 2016-08-06 VITALS — BP 114/76 | Temp 98.5°F | Ht 63.0 in | Wt 127.8 lb

## 2016-08-06 DIAGNOSIS — D649 Anemia, unspecified: Secondary | ICD-10-CM

## 2016-08-06 DIAGNOSIS — K529 Noninfective gastroenteritis and colitis, unspecified: Secondary | ICD-10-CM

## 2016-08-06 LAB — POCT HEMOGLOBIN: HEMOGLOBIN: 14.2 g/dL (ref 12.2–16.2)

## 2016-08-06 NOTE — Progress Notes (Signed)
   Subjective:    Patient ID: [name] [name], female    DOB: [birth year], 76 y.o.   MRN: [id number]  Emesis   This is a new problem. The current episode started in the past 7 days. Associated symptoms include headaches. Associated symptoms comments: diarrhea. Treatments tried: lomotil.   Vomiting and diarrhea has finally stopped but still weak  Started with vomiting  Then very frequent darrhea,  Pos hx of severre diarrhea  Took lomotil did help  None since last eve  No sig fever  Felt a little chilled at times  No exposure to gi msymtoms elsewhere  Results for orders placed or performed in visit on 08/06/16  POCT hemoglobin  Result Value Ref Range   Hemoglobin 14.2 12.2 - 16.2 g/dL     Review of Systems  Gastrointestinal: Positive for vomiting.  Neurological: Positive for headaches.       Objective:   Physical Exam Alert active vitals stable no acute distress hydration decent lungs clear heart regular in rhythm abdomen hyperactive bowel sounds no discrete tenderness       Assessment & Plan:  Impression acute gastroenteritis possibly food poisoning after eating tainted salad. Transient blood yesterday now improved. Hemoglobin stable warning signs discussed carefully. Dietary changes discussed WSL

## 2016-08-12 ENCOUNTER — Telehealth: Payer: Self-pay | Admitting: Cardiology

## 2016-08-12 MED ORDER — ATENOLOL 25 MG PO TABS: 25.0000 mg | ORAL_TABLET | Freq: Every day | ORAL | 3 refills | Status: DC

## 2016-08-12 NOTE — Telephone Encounter (Signed)
Pt calling in to inform Dr Meda Coffee that ever since she was taken off of atenolol 25 mg po daily (for nationwide shortage of this med, a couple months ago), and placed on metoprolol 50 mg po bid in its place.  Pt states that with taking metoprolol, she has noticed swelling in her ankles, and she gets tingling in bilateral hands, at her finger tips.  Pt states that she has no sob, no doe, and no chest pain.  Only swelling to bilateral ankles and tingling in bilateral fingertips.   Pt states that bilateral hands have normal appearance in color,  but she c/o of her hands being cold all the time.  Pt able to move both hands and fingertips with out any difficulty.  Pt correlates both complaints with taking metoprolol.  Pt heard that the nationwide shortage of atenolol has ended, and she was wanting to know if Dr Meda Coffee thinks her issues correlate with taking metoprolol, and should she be switched back to her old regimen of atenolol 25 mg po daily.  Informed the pt that Dr Meda Coffee is out of the office today but I will route this message to her for further review and recommendation and follow-up with the pt shortly thereafter.  Pt verbalized understanding and agrees with this plan.  Scheduled the pt a follow-up appt to further eval complaints with Estella Husk PA-C on 4/26 at 0945.  Pt agreed to this appt date and time.  Will route this message to Dr Meda Coffee to review and advise on.

## 2016-08-12 NOTE — Telephone Encounter (Signed)
Notified the pt that per Dr Meda Coffee, we will switch her back to atenolol 25 mg po daily, and if this is still on shortage, then we will switch her to carvedilol 6.25 mg po bid instead.  Confirmed the pharmacy of choice with the pt.  Advised the pt that if there is still a shortage of atenolol at her pharmacy, then she should call the office back tomorrow and request to speak to a triage Nurse, to aid in switching her regimen to carvedilol 6.25 mg po bid instead.  Pt verbalized understanding and agrees with this plan.

## 2016-08-12 NOTE — Telephone Encounter (Signed)
If the atenolol is available again please restart at the original dose 25 mg po daily. Otherwise switch to carvedilol 6.25 mg po BID. Thank you, KN

## 2016-08-12 NOTE — Telephone Encounter (Signed)
New Message     Pt is having swelling in ankles , and tingling/numbness in the end of her fingers, she is also having   Patient c/o Palpitations:  High priority if patient c/o lightheadedness and shortness of breath.  1. How long have you been having palpitations? A couple months , since the medication change   2. Are you currently experiencing lightheadedness and shortness of breath? no  3. Have you checked your BP and heart rate? (document readings) no  4. Are you experiencing any other symptoms? Swelling in ankles, and tingling numbness in fingers , all started when you changed her medication   Has appt 08/26/16 945 a

## 2016-08-16 DIAGNOSIS — H2513 Age-related nuclear cataract, bilateral: Secondary | ICD-10-CM | POA: Diagnosis not present

## 2016-08-16 DIAGNOSIS — H04123 Dry eye syndrome of bilateral lacrimal glands: Secondary | ICD-10-CM | POA: Diagnosis not present

## 2016-08-16 DIAGNOSIS — H35342 Macular cyst, hole, or pseudohole, left eye: Secondary | ICD-10-CM | POA: Diagnosis not present

## 2016-08-16 DIAGNOSIS — Z888 Allergy status to other drugs, medicaments and biological substances status: Secondary | ICD-10-CM | POA: Diagnosis not present

## 2016-08-16 DIAGNOSIS — Z885 Allergy status to narcotic agent status: Secondary | ICD-10-CM | POA: Diagnosis not present

## 2016-08-16 DIAGNOSIS — H11823 Conjunctivochalasis, bilateral: Secondary | ICD-10-CM | POA: Diagnosis not present

## 2016-08-16 DIAGNOSIS — Z882 Allergy status to sulfonamides status: Secondary | ICD-10-CM | POA: Diagnosis not present

## 2016-08-16 DIAGNOSIS — H02833 Dermatochalasis of right eye, unspecified eyelid: Secondary | ICD-10-CM | POA: Diagnosis not present

## 2016-08-16 DIAGNOSIS — Z87891 Personal history of nicotine dependence: Secondary | ICD-10-CM | POA: Diagnosis not present

## 2016-08-16 DIAGNOSIS — H02836 Dermatochalasis of left eye, unspecified eyelid: Secondary | ICD-10-CM | POA: Diagnosis not present

## 2016-08-16 DIAGNOSIS — H43392 Other vitreous opacities, left eye: Secondary | ICD-10-CM | POA: Diagnosis not present

## 2016-08-16 DIAGNOSIS — H53482 Generalized contraction of visual field, left eye: Secondary | ICD-10-CM | POA: Diagnosis not present

## 2016-08-16 DIAGNOSIS — G245 Blepharospasm: Secondary | ICD-10-CM | POA: Diagnosis not present

## 2016-08-16 DIAGNOSIS — H359 Unspecified retinal disorder: Secondary | ICD-10-CM | POA: Diagnosis not present

## 2016-08-18 ENCOUNTER — Ambulatory Visit (INDEPENDENT_AMBULATORY_CARE_PROVIDER_SITE_OTHER): Payer: Medicare Other | Admitting: Family Medicine

## 2016-08-18 ENCOUNTER — Encounter: Payer: Self-pay | Admitting: Family Medicine

## 2016-08-18 VITALS — BP 138/80 | Temp 98.6°F | Ht 63.0 in | Wt 129.0 lb

## 2016-08-18 DIAGNOSIS — K5733 Diverticulitis of large intestine without perforation or abscess with bleeding: Secondary | ICD-10-CM

## 2016-08-18 DIAGNOSIS — R109 Unspecified abdominal pain: Secondary | ICD-10-CM | POA: Diagnosis not present

## 2016-08-18 LAB — POCT URINALYSIS DIPSTICK: PH UA: 6 (ref 5.0–8.0)

## 2016-08-18 MED ORDER — ONDANSETRON 4 MG PO TBDP: 4.0000 mg | ORAL_TABLET | Freq: Four times a day (QID) | ORAL | 0 refills | Status: DC | PRN

## 2016-08-18 MED ORDER — METRONIDAZOLE 500 MG PO TABS: 500.0000 mg | ORAL_TABLET | Freq: Three times a day (TID) | ORAL | 0 refills | Status: DC

## 2016-08-18 MED ORDER — CIPROFLOXACIN HCL 500 MG PO TABS: 500.0000 mg | ORAL_TABLET | Freq: Two times a day (BID) | ORAL | 0 refills | Status: DC

## 2016-08-18 NOTE — Progress Notes (Signed)
   Subjective:    Patient ID: [name] [name], female    DOB: [birth year], 76 y.o.   MRN: [id number]  Abdominal Pain  This is a new problem. Episode onset: 2 days. The pain is located in the LLQ. The pain is aggravated by movement.   Hx of diverticulitis with scan in 2012 showing sig diverticulitis  Mild flare in 2015  Pain the last 2 days. Steady. Burning at times. Increasing over the last 48 hours. No obvious fever or chills. Primarily left lower quadrant radiating towards lower mid pelvis. Severity was mild first a now moderate to severe at times today. He notes pain when walking and stepping. Perhaps some improvement with Tylenol. Has tried to cut down on diet.  Old records reviewed   Review of Systems  Gastrointestinal: Positive for abdominal pain.  No headache no chest pain no measurable fevers     Objective:   Physical Exam  Alert and oriented, vitals reviewed and stable, NAD ENT-TM's and ext canals WNL bilat via otoscopic exam Soft palate, tonsils and post pharynx WNL via oropharyngeal exam Neck-symmetric, no masses; thyroid nonpalpable and nontender Pulmonary-no tachypnea or accessory muscle use; Clear without wheezes via auscultation Card--no abnrml murmurs, rhythm reg and rate WNL Carotid pulses symmetric, without bruits Left lower quadrant distinct discrete tenderness bowel sounds intact no rebound no guarding no masses      Assessment & Plan:  Impression probable flare of diverticulitis discussed. Old records reviewed. Has about approximately every 3 years. Do colonoscopy next year. Flare may have come about from eating popcorn patient not sure plan warning signs discussed carefully. Cipro twice a day 10 days. Metronidazole 3 times a day 10 days. Symptom care discussed.

## 2016-08-25 NOTE — Progress Notes (Signed)
Cardiology Office Note    Date:  08/26/2016   ID:  [name] [name], DOB 1941-04-25, MRN [id number]  PCP:  Mickie Hillier, MD  Cardiologist: Dr. Meda Coffee  Chief Complaint  Patient presents with  . Palpitations    History of Present Illness:  [name] [name] is a 76 y.o. female with history of MVP, bradycardia tachycardia syndrome, palpitations, HLD, hypertension, normal coronary arteries and LV function on cath in 10/16/13. Last saw Dr. Meda Coffee 10/28/15 which she was mourning the loss of her husband. Holter monitor in the past showed 2 episodes of nonsustained ventricular tachycardia lasting 14 and 11 cycles. Cardiac MRI showed normal biventricular function and ruled out infiltrative or inflammatory cardiomyopathy. Repeat echo in March 2017 showed normal biventricular size thickness and function. She was evaluated by EPS and recommended she take beta blocker and watchful waiting. Flecainide would be another medical option in conjunction with her beta blocker she has bothersome symptoms.  When atenolol was on a nationwide shortage she was placed on metoprolol 50 mg twice a day in its place. She complained of swelling in her ankles and tingling in both hands at her fingertips. She says it only occurred when she started the metoprolol.  Patient comes in today for follow-up. She said she had a lot of palpitations when she was on the metoprolol that worried her she had the swelling in her ankles and continues to have tingling in both hands at her fingertips off and on. The swelling in her ankles and palpitations have improved greatly since she is back on atenolol. She had palpitation yesterday but it was quick and fleeting.  Past Medical History:  Diagnosis Date  . Anal sphincter incompetence 12/31/2015   Manometry 12/2015  . Brady-tachy syndrome ([place])   . Chest pain 10/16/13   normal coronary arteries on cath and normal EF  . Colitis, ischemic ([place])   . Colon polyp 07/29/1993   with focal  adenomatous changes  . Diverticulitis   . Diverticulosis of colon   . Dyspepsia   . Dyssynergic defecation 12/31/2015   Anorectal mano 12/2015, also has rectal hypersensitivity  . Fatty liver   . HLD (hyperlipidemia)   . Hypertension   . IBS (irritable bowel syndrome)   . Incontinence, feces   . MVP (mitral valve prolapse)   . nhl dx'd 07/1999   chemo comp 2001; rituxin comp 2005  . Osteopenia   . Palpitation   . Rectocele   . Rectocele   . Vasovagal syncope     Past Surgical History:  Procedure Laterality Date  . ABDOMINAL HYSTERECTOMY    . ABDOMINAL SURGERY  2001    for non hodgkins lymphoma; lymphoma in mesentary  . ANAL RECTAL MANOMETRY N/A 12/24/2015   Procedure: ANO RECTAL MANOMETRY;  Surgeon: Gatha Mayer, MD;  Location: [facility];  Service: Endoscopy;  Laterality: N/A;  . BLADDER SUSPENSION    . BUNIONECTOMY    . CHOLECYSTECTOMY  2003  . COLONOSCOPY  7/200/, 11/2007   diverticulosis  . LEFT HEART CATHETERIZATION WITH CORONARY ANGIOGRAM N/A 10/16/2013   Procedure: LEFT HEART CATHETERIZATION WITH CORONARY ANGIOGRAM;  Surgeon: Peter M Martinique, MD;  Location: [facility] CATH LAB;  Service: Cardiovascular;  Laterality: N/A;  . pelvic prolapse repair    . UPPER GASTROINTESTINAL ENDOSCOPY  10/2005   hiatal hernia    Current Medications: Outpatient Medications Prior to Visit  Medication Sig Dispense Refill  . ALPRAZolam (XANAX) 0.25 MG tablet Take 1 tablet (0.25 mg total) by  mouth daily as needed for anxiety. 30 tablet 2  . aspirin EC 81 MG tablet Take 81 mg by mouth daily.    Marland Kitchen atenolol (TENORMIN) 25 MG tablet Take 1 tablet (25 mg total) by mouth daily. 90 tablet 3  . clobetasol ointment (TEMOVATE) 2.69 % Apply 1 application topically daily as needed (dermatitis).     . fexofenadine (ALLEGRA) 180 MG tablet Take 180 mg by mouth daily. Reported on 06/24/2015    . fish oil-omega-3 fatty acids 1000 MG capsule Take 1 g by mouth daily.     . Multiple Vitamin (MULTIVITAMIN) tablet Take  1 tablet by mouth daily.      . ondansetron (ZOFRAN ODT) 4 MG disintegrating tablet Take 1 tablet (4 mg total) by mouth every 6 (six) hours as needed for nausea or vomiting. 16 tablet 0  . pantoprazole (PROTONIX) 40 MG tablet Take 1 tablet (40 mg total) by mouth daily before breakfast. 90 tablet 3  . Polyvinyl Alcohol-Povidone (REFRESH OP) Apply 1 drop to eye 3 (three) times daily.    . Probiotic Product ([place]) CAPS Take 1 capsule by mouth daily.    . RESTASIS 0.05 % ophthalmic emulsion Place 1 drop into both eyes 2 (two) times daily.     . Zoledronic Acid (RECLAST IV) Inject into the vein. Takes once a year    . ciprofloxacin (CIPRO) 500 MG tablet Take 1 tablet (500 mg total) by mouth 2 (two) times daily. 20 tablet 0  . diphenoxylate-atropine (LOMOTIL) 2.5-0.025 MG tablet Take 1 tablet by mouth 4 (four) times daily as needed for diarrhea or loose stools. 24 tablet 0  . metroNIDAZOLE (FLAGYL) 500 MG tablet Take 1 tablet (500 mg total) by mouth 3 (three) times daily. 30 tablet 0   No facility-administered medications prior to visit.      Allergies:   Pamine forte [methscopolamine]; Robinul [glycopyrrolate]; Sulfa antibiotics; Sulfonamide derivatives; Ace inhibitors; Codeine; Iodine; and Iohexol   Social History   Social History  . Marital status: Widowed    Spouse name: N/A  . Number of children: 1  . Years of education: N/A   Occupational History  . Clerical work part time   .  Retired   Social History Main Topics  . Smoking status: Former Smoker    Packs/day: 0.33  . Smokeless tobacco: Never Used     Comment: quit 1976  . Alcohol use 0.0 oz/week     Comment: very rare  . Drug use: No  . Sexual activity: Yes    Birth control/ protection: Surgical   Other Topics Concern  . None   Social History Narrative  . None     Family History:  The patient's   family history includes Colon cancer (age of onset: 17) in her father; Colon polyps in her brother and  brother; Prostate cancer in her brother; Stomach cancer in her maternal uncle.   ROS:   Please see the history of present illness.    Review of Systems  Constitution: Negative.  HENT: Negative.   Eyes: Negative.   Cardiovascular: Positive for leg swelling and palpitations.  Respiratory: Negative.   Hematologic/Lymphatic: Negative.   Musculoskeletal: Negative.  Negative for joint pain.  Gastrointestinal: Positive for abdominal pain.       Recent diverticulitis flair  Genitourinary: Negative.   Neurological: Positive for sensory change.   All other systems reviewed and are negative.   PHYSICAL EXAM:   VS:  BP 122/68   Pulse 70  Ht 5\' 3"  (1.6 m)   Wt 127 lb 12.8 oz (58 kg)   BMI 22.64 kg/m   Physical Exam  GEN: Well nourished, well developed, in no acute distress  Neck: no JVD, carotid bruits, or masses Cardiac:RRR; mitral valve prolapse 1/6 systolic murmur in the apex, no rub or gallop Respiratory:  clear to auscultation bilaterally, normal work of breathing GI: soft, nontender, nondistended, + BS Ext: without cyanosis, clubbing, or edema, Good distal pulses bilaterally Neuro:  Alert and Oriented x 3 Psych: euthymic mood, full affect  Wt Readings from Last 3 Encounters:  08/26/16 127 lb 12.8 oz (58 kg)  08/18/16 129 lb (58.5 kg)  08/06/16 127 lb 12.8 oz (58 kg)      Studies/Labs Reviewed:   EKG:  EKG is  ordered today.  The ekg ordered today demonstrates Normal sinus rhythm with nonspecific ST-T wave changes, no acute change  Recent Labs: 08/06/2016: Hemoglobin 14.2   Lipid Panel    Component Value Date/Time   CHOL 203 (H) 12/03/2015 0939   TRIG 114 12/03/2015 0939   HDL 47 12/03/2015 0939   CHOLHDL 4.3 12/03/2015 0939   CHOLHDL 4.6 02/06/2013 0806   VLDL 33 02/06/2013 0806   LDLCALC 133 (H) 12/03/2015 0939    Additional studies/ records that were reviewed today include:  2-D echo 07/16/15  Study Conclusions   - Left ventricle: The cavity size was normal.  Wall thickness was   normal. Systolic function was normal. The estimated ejection   fraction was in the range of 55% to 60%. Wall motion was normal;   there were no regional wall motion abnormalities. Doppler   parameters are consistent with abnormal left ventricular   relaxation (grade 1 diastolic dysfunction). - Aortic valve: There was no stenosis. - Mitral valve: There was trivial regurgitation. - Right ventricle: The cavity size was normal. Systolic function   was normal. - Tricuspid valve: Peak RV-RA gradient (S): 20 mm Hg. - Pulmonary arteries: PA peak pressure: 23 mm Hg (S). - Inferior vena cava: The vessel was normal in size. The   respirophasic diameter changes were in the normal range (= 50%),   consistent with normal central venous pressure.   Impressions:   - Normal LV size with EF 55-60%. Normal RV size and systolic   function. No significant valvular abnormalities.   Echocardiogram - 09/12/2013 Left ventricle: The cavity size was normal. Wall thickness was normal. Systolic function was normal. The estimated ejection fraction was 55%. Wall motion was normal; there were no regional wall motion abnormalities. Aortic valve: Trileaflet. Doppler: There was no stenosis. No regurgitation. Aorta: Aortic root normal size. Mitral valve: Minimal prolapse of the posterior leaflet. Doppler: There was no evidence for stenosis. Mild regurgitation. Peak gradient: 31mm Hg (D). Left atrium: The atrium was normal in size. Right ventricle: The cavity size was normal. Systolic function was normal. Pulmonic valve: The valve appears to be grossly normal. Tricuspid valve: Doppler: Mild regurgitation. Right atrium: The atrium was normal in size. Pericardium: The pericardium was normal in appearance. Systemic veins: Inferior vena cava: The vessel was normal in size; the respirophasic diameter changes were in the normal range (= 50%); findings are consistent with normal central venous  pressure. Post procedure conclusions Ascending Aorta:  - Aortic root normal size.   Cardiac MRI: 12/2013 IMPRESSION: 1. Normal left ventricular size, thickness and systolic function (LVEF = 56%). No late gadolinium enhancement seen in the left ventricular myocardium.   2. Normal right ventricular size,  thickness and systolic function (RVEF = 56%). No regional wall motion abnormalities. No abnormal fat or LGE seen in the right ventricular myocardium. Conclusively, there is no evidence for arrhythmogenic right ventricular dysplasia.   3. Prolapse of the anterior mitral valve leaflet associated with mild mitral regurgitation.   4. Mild tricuspid regurgitation.       ASSESSMENT:    1. Palpitations   2. NSVT (nonsustained ventricular tachycardia) ([place])   3. MVP (mitral valve prolapse)   4. Essential hypertension   5. Peripheral polyneuropathy      PLAN:  In order of problems listed above:  Palpitations were much worse when she was on metoprolol. Much better now that she is taking atenolol. No further changes. We'll check potassium and magnesium  NSVT stable on atenolol  MVP only mild murmur on exam  Essential hypertension controlled  Peripheral neuropathy we'll check B-12 and told her she could take multivitamin to see if this would help. Otherwise follow-up with Dr. Wolfgang Phoenix    Medication Adjustments/Labs and Tests Ordered: Current medicines are reviewed at length with the patient today.  Concerns regarding medicines are outlined above.  Medication changes, Labs and Tests ordered today are listed in the Patient Instructions below. Patient Instructions  Medication Instructions:   Your physician recommends that you continue on your current medications as directed. Please refer to the Current Medication list given to you today.   If you need a refill on your cardiac medications before your next appointment, please call your pharmacy.  Labwork:  BMET MAG AND B 12  TODAY    Testing/Procedures: NONE ORDERED  TODAY    Follow-Up:  AS SCHEDULED    Any Other Special Instructions Will Be Listed Below (If Applicable).                                                                                                                                                      Sumner Boast, PA-C  08/26/2016 9:56 AM    [facility] [address], [place], [place]  [address] Phone: [phone]; Fax: [phone]

## 2016-08-26 ENCOUNTER — Ambulatory Visit (INDEPENDENT_AMBULATORY_CARE_PROVIDER_SITE_OTHER): Payer: Medicare Other | Admitting: Physician Assistant

## 2016-08-26 ENCOUNTER — Encounter: Payer: Self-pay | Admitting: Physician Assistant

## 2016-08-26 VITALS — BP 122/68 | HR 70 | Ht 63.0 in | Wt 127.8 lb

## 2016-08-26 DIAGNOSIS — I472 Ventricular tachycardia: Secondary | ICD-10-CM | POA: Diagnosis not present

## 2016-08-26 DIAGNOSIS — I4729 Other ventricular tachycardia: Secondary | ICD-10-CM

## 2016-08-26 DIAGNOSIS — R002 Palpitations: Secondary | ICD-10-CM | POA: Diagnosis not present

## 2016-08-26 DIAGNOSIS — I10 Essential (primary) hypertension: Secondary | ICD-10-CM | POA: Diagnosis not present

## 2016-08-26 DIAGNOSIS — G629 Polyneuropathy, unspecified: Secondary | ICD-10-CM | POA: Diagnosis not present

## 2016-08-26 DIAGNOSIS — I341 Nonrheumatic mitral (valve) prolapse: Secondary | ICD-10-CM | POA: Diagnosis not present

## 2016-08-26 LAB — SPECIMEN STATUS

## 2016-08-26 NOTE — Patient Instructions (Signed)
Medication Instructions:   Your physician recommends that you continue on your current medications as directed. Please refer to the Current Medication list given to you today.   If you need a refill on your cardiac medications before your next appointment, please call your pharmacy.  Labwork:  BMET MAG AND B 12 TODAY    Testing/Procedures: NONE ORDERED  TODAY    Follow-Up:  AS SCHEDULED    Any Other Special Instructions Will Be Listed Below (If Applicable).

## 2016-08-27 LAB — VITAMIN B12: Vitamin B-12: 508 pg/mL (ref 232–1245)

## 2016-08-27 LAB — BASIC METABOLIC PANEL
BUN / CREAT RATIO: 21 (ref 12–28)
BUN: 15 mg/dL (ref 8–27)
CALCIUM: 10.4 mg/dL — AB (ref 8.7–10.3)
CO2: 25 mmol/L (ref 18–29)
Chloride: 102 mmol/L (ref 96–106)
Creatinine, Ser: 0.71 mg/dL (ref 0.57–1.00)
GFR, EST AFRICAN AMERICAN: 96 mL/min/{1.73_m2} (ref >59)
GFR, EST NON AFRICAN AMERICAN: 84 mL/min/{1.73_m2} (ref >59)
Glucose: 96 mg/dL (ref 65–99)
Potassium: 4.3 mmol/L (ref 3.5–5.2)
Sodium: 141 mmol/L (ref 134–144)

## 2016-08-27 LAB — MAGNESIUM: Magnesium: 2.1 mg/dL (ref 1.6–2.3)

## 2016-09-22 ENCOUNTER — Encounter: Payer: Self-pay | Admitting: Internal Medicine

## 2016-09-22 ENCOUNTER — Ambulatory Visit (INDEPENDENT_AMBULATORY_CARE_PROVIDER_SITE_OTHER): Payer: Medicare Other | Admitting: Internal Medicine

## 2016-09-22 ENCOUNTER — Ambulatory Visit: Payer: Medicare Other | Admitting: Internal Medicine

## 2016-09-22 ENCOUNTER — Telehealth: Payer: Self-pay | Admitting: Internal Medicine

## 2016-09-22 VITALS — BP 136/64 | HR 71 | Ht 63.0 in | Wt 130.0 lb

## 2016-09-22 DIAGNOSIS — R197 Diarrhea, unspecified: Secondary | ICD-10-CM | POA: Diagnosis not present

## 2016-09-22 DIAGNOSIS — R1032 Left lower quadrant pain: Secondary | ICD-10-CM

## 2016-09-22 DIAGNOSIS — R10814 Left lower quadrant abdominal tenderness: Secondary | ICD-10-CM | POA: Diagnosis not present

## 2016-09-22 DIAGNOSIS — Z8572 Personal history of non-Hodgkin lymphomas: Secondary | ICD-10-CM | POA: Diagnosis not present

## 2016-09-22 NOTE — Progress Notes (Signed)
   ELANAH OSMANOVIC 76 y.o. 07-27-1940 [id number]  Assessment & Plan:   Encounter Diagnoses  Name Primary?  . Left lower quadrant abdominal tenderness without rebound tenderness Yes  . LLQ pain   . Bloody diarrhea - resolved   . History of non-Hodgkin's lymphoma - remote    Possibilities include IBS and residual pain after gastroenteritis/colitis vs diverticulitis. I wonder if what she had 4/8 was not ischemic colitis. Better now but still tender and having pain and is worried. Recent colonoscopy left diverticulosis only  Abx Tx by PCP was very reasonable  Plan for CT abd/pelvis w/ contrast - she says remote hx mild reaction so takes Benadryl and no steroids as a prep - has to take clear oral contrast   Offered sxic Tx dicyclomine she declined and also possible repaet Abx - CT first  I appreciate the opportunity to care for this patient. QQ:IWLNLG, Grace Bushy, MD    Subjective:   Chief Complaint: abdominal pains  HPI Elderly ww here alone - has long hx IBS, 4/8 got abd cramps, diarrhea that became bloody and nausea + vomiting - was so sick could not go to PCP and was Tx Lomotil and Zofran and then saw Dr. Wolfgang Phoenix. He thought possible diverticulitis and Tx Abx cipro and metronidazole. She is better but still not right. Having LLQ pain and soreness still and has 1 day of sharp RLQ pain 2 d ago. No fevers. Sleeps ok. No GU c/o. Mild back pain - chronic  Medications, allergies, past medical history, past surgical history, family history and social history are reviewed and updated in the EMR.  Review of Systems Neg GU, mild chronic low back pain  Objective:   Physical Exam BP 136/64 (BP Location: Left Arm, Patient Position: Sitting, Cuff Size: Normal)   Pulse 71   Ht 5\' 3"  (1.6 m)   Wt 130 lb (59 kg)   BMI 23.03 kg/m  Eyes anicteric abd BS +, soft, Mild - mod LLQ tenderness w/ some guarding Ext no cc/e Appropriate mood and affect Alert and oriented x 3  Reviewed data as  per HPI PCP and recent cardiology notes (palpitations)

## 2016-09-22 NOTE — Patient Instructions (Addendum)
You have been scheduled for a CT scan of the abdomen and pelvis at [facility] are scheduled on 09/29/16 at 10:30AM. You should arrive at 8:30AM for your appointment time for registration and to drink the clear contrast. Please follow the written instructions below on the day of your exam:  WARNING: IF YOU ARE ALLERGIC TO IODINE/X-RAY DYE, PLEASE NOTIFY RADIOLOGY IMMEDIATELY AT [phone]! YOU WILL BE GIVEN A 13 HOUR PREMEDICATION PREP.  Take your benadryl 50 mg one hour prior to your appointment as you have done in the past.  Please bring a driver with you in case this makes you sleepy.  1) Do not eat or drink anything after 6:30AM (4 hours prior to your test)   This test typically takes 30-45 minutes to complete.  If you have any questions regarding your exam or if you need to reschedule, you may call the CT department at [phone] between the hours of 8:00 am and 5:00 pm, Monday-Friday.  ________________________________________________________________________  I appreciate the opportunity to care for you. Silvano Rusk, MD, [facility]

## 2016-09-22 NOTE — Telephone Encounter (Signed)
CT is 09/29/16 at 10:30AM.  I called and had to leave her a message so she is aware that you wanted to wait on these results.  I told her to call back tomorrow if any more questions and to ask for [facility] as I am off.

## 2016-09-22 NOTE — Telephone Encounter (Signed)
Please advise Sir, thank you. 

## 2016-09-22 NOTE — Telephone Encounter (Signed)
When is CT?  I was waiting for that result before rxing

## 2016-09-23 DIAGNOSIS — L986 Other infiltrative disorders of the skin and subcutaneous tissue: Secondary | ICD-10-CM | POA: Diagnosis not present

## 2016-09-23 DIAGNOSIS — L309 Dermatitis, unspecified: Secondary | ICD-10-CM | POA: Diagnosis not present

## 2016-09-29 ENCOUNTER — Ambulatory Visit (HOSPITAL_COMMUNITY)
Admission: RE | Admit: 2016-09-29 | Discharge: 2016-09-29 | Disposition: A | Discharge status: Home / Self Care | Payer: Medicare Other | Source: Ambulatory Visit | Attending: Internal Medicine | Admitting: Internal Medicine

## 2016-09-29 ENCOUNTER — Encounter (HOSPITAL_COMMUNITY): Payer: Self-pay

## 2016-09-29 DIAGNOSIS — K5730 Diverticulosis of large intestine without perforation or abscess without bleeding: Secondary | ICD-10-CM | POA: Diagnosis not present

## 2016-09-29 DIAGNOSIS — R197 Diarrhea, unspecified: Secondary | ICD-10-CM | POA: Diagnosis not present

## 2016-09-29 DIAGNOSIS — R1032 Left lower quadrant pain: Secondary | ICD-10-CM | POA: Diagnosis not present

## 2016-09-29 DIAGNOSIS — I700 Atherosclerosis of aorta: Secondary | ICD-10-CM | POA: Insufficient documentation

## 2016-09-29 DIAGNOSIS — Z8572 Personal history of non-Hodgkin lymphomas: Secondary | ICD-10-CM | POA: Insufficient documentation

## 2016-09-29 DIAGNOSIS — R10814 Left lower quadrant abdominal tenderness: Secondary | ICD-10-CM | POA: Diagnosis not present

## 2016-09-29 MED ORDER — IOPAMIDOL (ISOVUE-300) INJECTION 61%
INTRAVENOUS | Status: AC
  Filled 2016-09-29: qty 100

## 2016-09-29 MED ORDER — IOPAMIDOL (ISOVUE-300) INJECTION 61%
30.0000 mL | Freq: Once | INTRAVENOUS | Status: AC | PRN
  Administered 2016-09-29: 30 mL via ORAL

## 2016-09-29 MED ORDER — IOPAMIDOL (ISOVUE-300) INJECTION 61%
100.0000 mL | Freq: Once | INTRAVENOUS | Status: AC | PRN
  Administered 2016-09-29: 100 mL via INTRAVENOUS

## 2016-09-29 MED ORDER — IOPAMIDOL (ISOVUE-300) INJECTION 61%
INTRAVENOUS | Status: AC
  Filled 2016-09-29: qty 30

## 2016-09-30 NOTE — Progress Notes (Signed)
My Chart note This is ok

## 2016-10-11 DIAGNOSIS — H469 Unspecified optic neuritis: Secondary | ICD-10-CM | POA: Diagnosis not present

## 2016-10-11 DIAGNOSIS — H35033 Hypertensive retinopathy, bilateral: Secondary | ICD-10-CM | POA: Diagnosis not present

## 2016-10-11 DIAGNOSIS — H43392 Other vitreous opacities, left eye: Secondary | ICD-10-CM | POA: Diagnosis not present

## 2016-10-11 DIAGNOSIS — H3581 Retinal edema: Secondary | ICD-10-CM | POA: Insufficient documentation

## 2016-10-11 DIAGNOSIS — H268 Other specified cataract: Secondary | ICD-10-CM | POA: Diagnosis not present

## 2016-10-11 DIAGNOSIS — H43813 Vitreous degeneration, bilateral: Secondary | ICD-10-CM | POA: Diagnosis not present

## 2016-10-11 DIAGNOSIS — H2513 Age-related nuclear cataract, bilateral: Secondary | ICD-10-CM | POA: Insufficient documentation

## 2016-10-11 DIAGNOSIS — H359 Unspecified retinal disorder: Secondary | ICD-10-CM | POA: Insufficient documentation

## 2016-10-11 DIAGNOSIS — H35342 Macular cyst, hole, or pseudohole, left eye: Secondary | ICD-10-CM | POA: Insufficient documentation

## 2016-10-27 DIAGNOSIS — H469 Unspecified optic neuritis: Secondary | ICD-10-CM | POA: Diagnosis not present

## 2016-10-27 DIAGNOSIS — H7491 Unspecified disorder of right middle ear and mastoid: Secondary | ICD-10-CM | POA: Diagnosis not present

## 2016-11-16 ENCOUNTER — Other Ambulatory Visit (HOSPITAL_COMMUNITY): Payer: Self-pay | Admitting: Internal Medicine

## 2016-11-16 DIAGNOSIS — M810 Age-related osteoporosis without current pathological fracture: Secondary | ICD-10-CM

## 2016-11-22 ENCOUNTER — Ambulatory Visit (HOSPITAL_COMMUNITY)
Admission: RE | Admit: 2016-11-22 | Discharge: 2016-11-22 | Disposition: A | Discharge status: Home / Self Care | Payer: Medicare Other | Source: Ambulatory Visit | Attending: Internal Medicine | Admitting: Internal Medicine

## 2016-11-22 DIAGNOSIS — M810 Age-related osteoporosis without current pathological fracture: Secondary | ICD-10-CM | POA: Diagnosis not present

## 2016-11-22 DIAGNOSIS — Z780 Asymptomatic menopausal state: Secondary | ICD-10-CM | POA: Diagnosis not present

## 2016-11-25 DIAGNOSIS — Z8572 Personal history of non-Hodgkin lymphomas: Secondary | ICD-10-CM | POA: Diagnosis not present

## 2016-11-25 DIAGNOSIS — H43813 Vitreous degeneration, bilateral: Secondary | ICD-10-CM | POA: Diagnosis not present

## 2016-11-25 DIAGNOSIS — Z885 Allergy status to narcotic agent status: Secondary | ICD-10-CM | POA: Diagnosis not present

## 2016-11-25 DIAGNOSIS — Z888 Allergy status to other drugs, medicaments and biological substances status: Secondary | ICD-10-CM | POA: Diagnosis not present

## 2016-11-25 DIAGNOSIS — H268 Other specified cataract: Secondary | ICD-10-CM | POA: Diagnosis not present

## 2016-11-25 DIAGNOSIS — Z7982 Long term (current) use of aspirin: Secondary | ICD-10-CM | POA: Diagnosis not present

## 2016-11-25 DIAGNOSIS — H43392 Other vitreous opacities, left eye: Secondary | ICD-10-CM | POA: Diagnosis not present

## 2016-11-25 DIAGNOSIS — H35033 Hypertensive retinopathy, bilateral: Secondary | ICD-10-CM | POA: Diagnosis not present

## 2016-11-25 DIAGNOSIS — H468 Other optic neuritis: Secondary | ICD-10-CM | POA: Diagnosis not present

## 2016-11-25 DIAGNOSIS — H35342 Macular cyst, hole, or pseudohole, left eye: Secondary | ICD-10-CM | POA: Diagnosis not present

## 2016-11-25 DIAGNOSIS — H469 Unspecified optic neuritis: Secondary | ICD-10-CM | POA: Diagnosis not present

## 2016-11-25 DIAGNOSIS — Z882 Allergy status to sulfonamides status: Secondary | ICD-10-CM | POA: Diagnosis not present

## 2016-11-25 DIAGNOSIS — Z87891 Personal history of nicotine dependence: Secondary | ICD-10-CM | POA: Diagnosis not present

## 2016-11-25 DIAGNOSIS — H2513 Age-related nuclear cataract, bilateral: Secondary | ICD-10-CM | POA: Diagnosis not present

## 2016-11-25 DIAGNOSIS — Z79899 Other long term (current) drug therapy: Secondary | ICD-10-CM | POA: Diagnosis not present

## 2016-12-01 ENCOUNTER — Other Ambulatory Visit: Payer: Self-pay | Admitting: Family Medicine

## 2016-12-01 DIAGNOSIS — Z1231 Encounter for screening mammogram for malignant neoplasm of breast: Secondary | ICD-10-CM

## 2016-12-23 DIAGNOSIS — H2513 Age-related nuclear cataract, bilateral: Secondary | ICD-10-CM | POA: Diagnosis not present

## 2016-12-23 DIAGNOSIS — Z885 Allergy status to narcotic agent status: Secondary | ICD-10-CM | POA: Diagnosis not present

## 2016-12-23 DIAGNOSIS — Z91041 Radiographic dye allergy status: Secondary | ICD-10-CM | POA: Diagnosis not present

## 2016-12-23 DIAGNOSIS — H469 Unspecified optic neuritis: Secondary | ICD-10-CM | POA: Diagnosis not present

## 2016-12-23 DIAGNOSIS — Z91040 Latex allergy status: Secondary | ICD-10-CM | POA: Diagnosis not present

## 2016-12-23 DIAGNOSIS — Z87891 Personal history of nicotine dependence: Secondary | ICD-10-CM | POA: Diagnosis not present

## 2016-12-23 DIAGNOSIS — H35033 Hypertensive retinopathy, bilateral: Secondary | ICD-10-CM | POA: Diagnosis not present

## 2016-12-23 DIAGNOSIS — H43392 Other vitreous opacities, left eye: Secondary | ICD-10-CM | POA: Diagnosis not present

## 2016-12-23 DIAGNOSIS — H43813 Vitreous degeneration, bilateral: Secondary | ICD-10-CM | POA: Diagnosis not present

## 2016-12-23 DIAGNOSIS — Z882 Allergy status to sulfonamides status: Secondary | ICD-10-CM | POA: Diagnosis not present

## 2016-12-23 DIAGNOSIS — H268 Other specified cataract: Secondary | ICD-10-CM | POA: Diagnosis not present

## 2016-12-23 DIAGNOSIS — Z888 Allergy status to other drugs, medicaments and biological substances status: Secondary | ICD-10-CM | POA: Diagnosis not present

## 2016-12-23 DIAGNOSIS — H35342 Macular cyst, hole, or pseudohole, left eye: Secondary | ICD-10-CM | POA: Diagnosis not present

## 2016-12-24 ENCOUNTER — Ambulatory Visit: Payer: Medicare Other | Admitting: Cardiology

## 2016-12-24 DIAGNOSIS — D2271 Melanocytic nevi of right lower limb, including hip: Secondary | ICD-10-CM | POA: Diagnosis not present

## 2016-12-24 DIAGNOSIS — D2272 Melanocytic nevi of left lower limb, including hip: Secondary | ICD-10-CM | POA: Diagnosis not present

## 2016-12-24 DIAGNOSIS — L570 Actinic keratosis: Secondary | ICD-10-CM | POA: Diagnosis not present

## 2016-12-24 DIAGNOSIS — L821 Other seborrheic keratosis: Secondary | ICD-10-CM | POA: Diagnosis not present

## 2016-12-27 ENCOUNTER — Encounter: Payer: Self-pay | Admitting: Cardiology

## 2016-12-27 ENCOUNTER — Ambulatory Visit (INDEPENDENT_AMBULATORY_CARE_PROVIDER_SITE_OTHER): Payer: Medicare Other | Admitting: Cardiology

## 2016-12-27 VITALS — BP 110/70 | HR 58 | Ht 63.0 in | Wt 133.0 lb

## 2016-12-27 DIAGNOSIS — I472 Ventricular tachycardia: Secondary | ICD-10-CM

## 2016-12-27 DIAGNOSIS — I4729 Other ventricular tachycardia: Secondary | ICD-10-CM

## 2016-12-27 DIAGNOSIS — I739 Peripheral vascular disease, unspecified: Secondary | ICD-10-CM | POA: Diagnosis not present

## 2016-12-27 DIAGNOSIS — H5210 Myopia, unspecified eye: Secondary | ICD-10-CM | POA: Insufficient documentation

## 2016-12-27 DIAGNOSIS — H43829 Vitreomacular adhesion, unspecified eye: Secondary | ICD-10-CM | POA: Insufficient documentation

## 2016-12-27 DIAGNOSIS — I341 Nonrheumatic mitral (valve) prolapse: Secondary | ICD-10-CM

## 2016-12-27 DIAGNOSIS — I10 Essential (primary) hypertension: Secondary | ICD-10-CM | POA: Diagnosis not present

## 2016-12-27 NOTE — Progress Notes (Signed)
Cardiology Office Note    Date:  12/27/2016   ID:  Betsa[place], [place] 1940-08-07, MRN [id number]  PCP:  Mikey Kirschner, MD  Cardiologist: Dr. Meda Coffee  Chief complain: claudication  History of Present Illness:  [name] [name] is a 76 y.o. female with history of MVP, bradycardia tachycardia syndrome, palpitations, HLD, hypertension, normal coronary arteries and LV function on cath in 10/16/13. Last saw Dr. Meda Coffee 10/28/15 which she was mourning the loss of her husband. Holter monitor in the past showed 2 episodes of nonsustained ventricular tachycardia lasting 14 and 11 cycles. Cardiac MRI showed normal biventricular function and ruled out infiltrative or inflammatory cardiomyopathy. Repeat echo in March 2017 showed normal biventricular size thickness and function. She was evaluated by EPS and recommended she take beta blocker and watchful waiting. Flecainide would be another medical option in conjunction with her beta blocker she has bothersome symptoms.  When atenolol was on a nationwide shortage she was placed on metoprolol 50 mg twice a day in its place. She complained of swelling in her ankles and tingling in both hands at her fingertips. She says it only occurred when she started the metoprolol.  12/27/16 - 6 months follow-up , the patient is doing great, since the last visit she has had 3 episodes of palpitations associated with dizziness however lasted seconds and no episode of syncope. She is tolerating atenolol much better 10 metoprolol denies any orthostatic hypotension no falls. She hasn't had any recent chest pain or shortness of breath and walks 2 miles daily. She has noticed pain in both of her calves while walking.   Past Medical History:  Diagnosis Date  . Anal sphincter incompetence 12/31/2015   Manometry 12/2015  . Brady-tachy syndrome ([place])   . Chest pain 10/16/13   normal coronary arteries on cath and normal EF  . Colitis, ischemic ([place])   . Colon polyp 07/29/1993   with focal adenomatous changes  . Diverticulitis   . Diverticulosis of colon   . Dyspepsia   . Dyssynergic defecation 12/31/2015   Anorectal mano 12/2015, also has rectal hypersensitivity  . Fatty liver   . HLD (hyperlipidemia)   . Hypertension   . IBS (irritable bowel syndrome)   . Incontinence, feces   . MVP (mitral valve prolapse)   . nhl dx'd 07/1999   chemo comp 2001; rituxin comp 2005  . Osteopenia   . Palpitation   . Rectocele   . Rectocele   . Vasovagal syncope     Past Surgical History:  Procedure Laterality Date  . ABDOMINAL HYSTERECTOMY    . ABDOMINAL SURGERY  2001    for non hodgkins lymphoma; lymphoma in mesentary  . ANAL RECTAL MANOMETRY N/A 12/24/2015   Procedure: ANO RECTAL MANOMETRY;  Surgeon: Gatha Mayer, MD;  Location: [facility];  Service: Endoscopy;  Laterality: N/A;  . BLADDER SUSPENSION    . BUNIONECTOMY    . CHOLECYSTECTOMY  2003  . COLONOSCOPY  7/200/, 11/2007   diverticulosis  . LEFT HEART CATHETERIZATION WITH CORONARY ANGIOGRAM N/A 10/16/2013   Procedure: LEFT HEART CATHETERIZATION WITH CORONARY ANGIOGRAM;  Surgeon: Peter M Martinique, MD;  Location: [facility] CATH LAB;  Service: Cardiovascular;  Laterality: N/A;  . pelvic prolapse repair    . UPPER GASTROINTESTINAL ENDOSCOPY  10/2005   hiatal hernia    Current Medications: Outpatient Medications Prior to Visit  Medication Sig Dispense Refill  . ALPRAZolam (XANAX) 0.25 MG tablet Take 1 tablet (0.25 mg total) by mouth daily as  needed for anxiety. 30 tablet 2  . aspirin EC 81 MG tablet Take 81 mg by mouth daily.    Marland Kitchen atenolol (TENORMIN) 25 MG tablet Take 1 tablet (25 mg total) by mouth daily. 90 tablet 3  . clobetasol ointment (TEMOVATE) 6.38 % Apply 1 application topically daily as needed (dermatitis).     . fexofenadine (ALLEGRA) 180 MG tablet Take 180 mg by mouth daily. Reported on 06/24/2015    . Polyvinyl Alcohol-Povidone (REFRESH OP) Apply 1 drop to eye 3 (three) times daily.    . Probiotic Product  ([place]) CAPS Take 1 capsule by mouth daily.    . RESTASIS 0.05 % ophthalmic emulsion Place 1 drop into both eyes 2 (two) times daily.     . fish oil-omega-3 fatty acids 1000 MG capsule Take 1 g by mouth daily.     . Multiple Vitamin (MULTIVITAMIN) tablet Take 1 tablet by mouth daily.      . pantoprazole (PROTONIX) 40 MG tablet Take 1 tablet (40 mg total) by mouth daily before breakfast. 90 tablet 3  . Zoledronic Acid (RECLAST IV) Inject into the vein. Takes once a year     No facility-administered medications prior to visit.     Allergies:   Pamine forte [methscopolamine]; Robinul [glycopyrrolate]; Sulfa antibiotics; Sulfonamide derivatives; Ace inhibitors; Codeine; Iodine; and Iohexol   Social History   Social History  . Marital status: Widowed    Spouse name: N/A  . Number of children: 1  . Years of education: N/A   Occupational History  . Clerical work part time   .  Retired   Social History Main Topics  . Smoking status: Former Smoker    Packs/day: 0.33  . Smokeless tobacco: Never Used     Comment: quit 1976  . Alcohol use 0.0 oz/week     Comment: very rare  . Drug use: No  . Sexual activity: Yes    Birth control/ protection: Surgical   Other Topics Concern  . None   Social History Narrative  . None    Family History:  The patient's   family history includes Colon cancer (age of onset: 101) in her father; Colon polyps in her brother and brother; Prostate cancer in her brother; Stomach cancer in her maternal uncle.   ROS:   Please see the history of present illness.    Review of Systems  Constitution: Negative.  HENT: Negative.   Eyes: Negative.   Cardiovascular: Positive for leg swelling and palpitations.  Respiratory: Negative.   Hematologic/Lymphatic: Negative.   Musculoskeletal: Negative.  Negative for joint pain.  Gastrointestinal: Positive for abdominal pain.       Recent diverticulitis flair  Genitourinary: Negative.   Neurological:  Positive for sensory change.   All other systems reviewed and are negative.  PHYSICAL EXAM:   VS:  BP 110/70   Pulse (!) 58   Ht 5\' 3"  (1.6 m)   Wt 133 lb (60.3 kg)   BMI 23.56 kg/m   Physical Exam  GEN: Well nourished, well developed, in no acute distress  Neck: no JVD, carotid bruits, or masses Cardiac:RRR; mitral valve prolapse 1/6 systolic murmur in the apex, no rub or gallop Respiratory:  clear to auscultation bilaterally, normal work of breathing GI: soft, nontender, nondistended, + BS Ext: without cyanosis, clubbing, or edema, Good distal pulses bilaterally Neuro:  Alert and Oriented x 3 Psych: euthymic mood, full affect  Wt Readings from Last 3 Encounters:  12/27/16 133 lb (60.3  kg)  09/22/16 130 lb (59 kg)  08/26/16 127 lb 12.8 oz (58 kg)    Studies/Labs Reviewed:   EKG:  EKG is  ordered today.  The ekg ordered today demonstrates Normal sinus rhythm with nonspecific ST-T wave changes, no acute change  Recent Labs: 08/26/2016: BUN 15; Creatinine, Ser 0.71; Hemoglobin WILL FOLLOW; Magnesium 2.1; Platelets WILL FOLLOW; Potassium 4.3; Sodium 141   Lipid Panel    Component Value Date/Time   CHOL 203 (H) 12/03/2015 0939   TRIG 114 12/03/2015 0939   HDL 47 12/03/2015 0939   CHOLHDL 4.3 12/03/2015 0939   CHOLHDL 4.6 02/06/2013 0806   VLDL 33 02/06/2013 0806   LDLCALC 133 (H) 12/03/2015 0939   Additional studies/ records that were reviewed today include:   2-D echo 07/16/15   - Left ventricle: The cavity size was normal. Wall thickness was   normal. Systolic function was normal. The estimated ejection   fraction was in the range of 55% to 60%. Wall motion was normal;   there were no regional wall motion abnormalities. Doppler   parameters are consistent with abnormal left ventricular   relaxation (grade 1 diastolic dysfunction). - Aortic valve: There was no stenosis. - Mitral valve: There was trivial regurgitation. - Right ventricle: The cavity size was normal.  Systolic function   was normal. - Tricuspid valve: Peak RV-RA gradient (S): 20 mm Hg. - Pulmonary arteries: PA peak pressure: 23 mm Hg (S). - Inferior vena cava: The vessel was normal in size. The   respirophasic diameter changes were in the normal range (= 50%),   consistent with normal central venous pressure.   Impressions: - Normal LV size with EF 55-60%. Normal RV size and systolic   function. No significant valvular abnormalities.   Cardiac MRI: 12/2013 IMPRESSION: 1. Normal left ventricular size, thickness and systolic function (LVEF = 56%). No late gadolinium enhancement seen in the left ventricular myocardium.   2. Normal right ventricular size, thickness and systolic function (RVEF = 56%). No regional wall motion abnormalities. No abnormal fat or LGE seen in the right ventricular myocardium. Conclusively, there is no evidence for arrhythmogenic right ventricular dysplasia.   3. Prolapse of the anterior mitral valve leaflet associated with mild mitral regurgitation.   4. Mild tricuspid regurgitation.     ASSESSMENT:    1. Claudication in peripheral vascular disease ([place])   2. NSVT (nonsustained ventricular tachycardia) ([place])   3. MVP (mitral valve prolapse)   4. Essential hypertension    PLAN:  I 1. Claudications with week pulses in both feet, we will schedule bilateral arterial duplex.  2. Dyspnea on exertion - resolved, abnormal EKG portion of the stress test with normal cardiac catheterization.   3. Palpitations, event monitor showed 2 episodes of nonsustained ventricular tachycardia lasting 14 and 11 cycles.Cardiac MRI was showed normal biventricular function and ruled out infiltrative or inflammatory cardiomyopathy. Repeat echocardiogram in March 2017 showed normal biventricular size, thickness and function. nsVTs - evaluated by EP - "I have discussed the benign nature of her symptoms. As they are currently minimally symptomatic, I have recommended she take  her beta blocker and undergo watchful waiting. I would be glad to see her back as needed for worsening symptoms. Flecainide would be another medical option in conjunction with her beta blocker if she has bothersome symptoms."  4. Hypertension - controlled, continue atenolol.  5. Mitral prolapse - mild associated with trivial MR only.   6. Lipids - followed by primary care physician. HDL  47, triglycerides 117, LDL 133, she is going to have it rechecked at her primary care physician next month she'll fax [place] the results.   Medication Adjustments/Labs and Tests Ordered: Current medicines are reviewed at length with the patient today.  Concerns regarding medicines are outlined above.  Medication changes, Labs and Tests ordered today are listed in the Patient Instructions below. Patient Instructions  Your physician recommends that you continue on your current medications as directed. Please refer to the Current Medication list given to you today.  Your physician has requested that you have a lower or upper extremity arterial duplex. This test is an ultrasound of the arteries in the legs or arms. It looks at arterial blood flow in the legs and arms. Allow one hour for Lower Arterial scans. There are no restrictions or special instructions AT PT'S CONVENIENCE  Your physician wants you to follow-up in: [place] will receive a reminder letter in the mail two months in advance. If you don't receive a letter, please call our office to schedule the follow-up appointment.    Signed, Ena Dawley, MD  12/27/2016 9:34 AM    [address], [place], [place]  [address] Phone: [phone]; Fax: [phone]

## 2016-12-27 NOTE — Patient Instructions (Signed)
Your physician recommends that you continue on your current medications as directed. Please refer to the Current Medication list given to you today.  Your physician has requested that you have a lower or upper extremity arterial duplex. This test is an ultrasound of the arteries in the legs or arms. It looks at arterial blood flow in the legs and arms. Allow one hour for Lower Arterial scans. There are no restrictions or special instructions AT PT'S CONVENIENCE  Your physician wants you to follow-up in: [place] will receive a reminder letter in the mail two months in advance. If you don't receive a letter, please call our office to schedule the follow-up appointment.

## 2016-12-28 DIAGNOSIS — M810 Age-related osteoporosis without current pathological fracture: Secondary | ICD-10-CM | POA: Diagnosis not present

## 2016-12-28 DIAGNOSIS — Z6823 Body mass index (BMI) 23.0-23.9, adult: Secondary | ICD-10-CM | POA: Diagnosis not present

## 2016-12-28 DIAGNOSIS — M150 Primary generalized (osteo)arthritis: Secondary | ICD-10-CM | POA: Diagnosis not present

## 2016-12-29 DIAGNOSIS — G245 Blepharospasm: Secondary | ICD-10-CM | POA: Diagnosis not present

## 2016-12-30 DIAGNOSIS — M810 Age-related osteoporosis without current pathological fracture: Secondary | ICD-10-CM | POA: Diagnosis not present

## 2017-01-04 ENCOUNTER — Other Ambulatory Visit: Payer: Self-pay | Admitting: Cardiology

## 2017-01-04 DIAGNOSIS — I739 Peripheral vascular disease, unspecified: Secondary | ICD-10-CM

## 2017-01-05 ENCOUNTER — Ambulatory Visit (HOSPITAL_COMMUNITY)
Admission: RE | Admit: 2017-01-05 | Discharge: 2017-01-05 | Disposition: A | Discharge status: Home / Self Care | Payer: Medicare Other | Source: Ambulatory Visit | Attending: Cardiology | Admitting: Cardiology

## 2017-01-05 DIAGNOSIS — I739 Peripheral vascular disease, unspecified: Secondary | ICD-10-CM | POA: Diagnosis not present

## 2017-01-05 DIAGNOSIS — R9439 Abnormal result of other cardiovascular function study: Secondary | ICD-10-CM | POA: Diagnosis not present

## 2017-01-07 ENCOUNTER — Ambulatory Visit (INDEPENDENT_AMBULATORY_CARE_PROVIDER_SITE_OTHER): Payer: Medicare Other | Admitting: Nurse Practitioner

## 2017-01-07 ENCOUNTER — Encounter: Payer: Self-pay | Admitting: Nurse Practitioner

## 2017-01-07 VITALS — BP 120/74 | Ht 62.5 in | Wt 130.2 lb

## 2017-01-07 DIAGNOSIS — Z79899 Other long term (current) drug therapy: Secondary | ICD-10-CM

## 2017-01-07 DIAGNOSIS — Z01419 Encounter for gynecological examination (general) (routine) without abnormal findings: Secondary | ICD-10-CM | POA: Diagnosis not present

## 2017-01-07 DIAGNOSIS — Z23 Encounter for immunization: Secondary | ICD-10-CM | POA: Diagnosis not present

## 2017-01-07 DIAGNOSIS — E782 Mixed hyperlipidemia: Secondary | ICD-10-CM

## 2017-01-07 MED ORDER — ALPRAZOLAM 0.25 MG PO TABS: 0.2500 mg | ORAL_TABLET | Freq: Every day | ORAL | 2 refills | Status: DC | PRN

## 2017-01-08 ENCOUNTER — Encounter: Payer: Self-pay | Admitting: Nurse Practitioner

## 2017-01-08 NOTE — Progress Notes (Signed)
   Subjective:    Patient ID: [name] [name], female    DOB: 1941-03-01, 76 y.o.   MRN: [id number]  HPI presents for her wellness exam. Has had a total abdominal hysterectomy. No pelvic pain. No new sexual partners. Continues follow-up with several different specialists including GI. Gets regular skin cancer screenings. Overall healthy diet. Walks about 2 miles per day. Regular vision and dental exams. Has her mammogram scheduled for this month. Is interested in the shingrix vaccine but unsure if she can take this due to her history of lymphoma. Has had one case of shingles in the past. Depression screen [facility] 2/9 01/07/2017 11/19/2014 11/05/2014  Decreased Interest 0 0 0  Down, Depressed, Hopeless 0 0 0  PHQ - 2 Score 0 0 0       Review of Systems  Constitutional: Negative for activity change, appetite change and fatigue.  HENT: Negative for dental problem, ear pain, sinus pressure and sore throat.   Respiratory: Negative for cough, chest tightness, shortness of breath and wheezing.   Cardiovascular: Negative for chest pain and leg swelling.  Gastrointestinal: Negative for abdominal distention, abdominal pain, blood in stool, constipation, diarrhea, nausea and vomiting.  Genitourinary: Negative for difficulty urinating, dysuria, enuresis, frequency, genital sores, pelvic pain, urgency and vaginal discharge.       Objective:   Physical Exam  Constitutional: She is oriented to person, place, and time. She appears well-developed. No distress.  HENT:  Right Ear: External ear normal.  Left Ear: External ear normal.  Mouth/Throat: Oropharynx is clear and moist.  Neck: Normal range of motion. Neck supple. No tracheal deviation present. No thyromegaly present.  Cardiovascular: Normal rate, regular rhythm and normal heart sounds.  Exam reveals no gallop.   No murmur heard. Pulmonary/Chest: Effort normal and breath sounds normal.  Abdominal: Soft. She exhibits no distension. There is no  tenderness.  Genitourinary: Vagina normal. No vaginal discharge found.  Genitourinary Comments: External GU: no rashes or lesions. Vagina: slightly pale, no discharge. Bimanual exam: no tenderness or obvious masses.   Musculoskeletal: She exhibits no edema.  Lymphadenopathy:    She has no cervical adenopathy.  Neurological: She is alert and oriented to person, place, and time.  Skin: Skin is warm and dry. No rash noted.  Psychiatric: She has a normal mood and affect. Her behavior is normal.  Vitals reviewed. Breast exam: no masses; nipples nl in appearance. Axillae no adenopathy.        Assessment & Plan:  Well woman exam - Plan: Hepatic function panel, Lipid panel  Need for influenza vaccination - Plan: Flu Vaccine QUAD 6+ mos PF IM (Fluarix Quad PF)  Will research use of Shingrix considering her history and use of Methotrexate. Continue follow up with specialists as planned. Recommend daily vitamin D.  Return in about 1 year (around 01/07/2018) for physical.

## 2017-01-10 ENCOUNTER — Telehealth: Payer: Self-pay | Admitting: Cardiology

## 2017-01-10 NOTE — Telephone Encounter (Signed)
New message    Pt is returning call to [facility] about results.

## 2017-01-10 NOTE — Telephone Encounter (Signed)
Notified the pt that per Dr Meda Coffee, she had a Normal LE arterial US.  Pt verbalized understanding.

## 2017-01-12 DIAGNOSIS — Z79899 Other long term (current) drug therapy: Secondary | ICD-10-CM | POA: Diagnosis not present

## 2017-01-12 DIAGNOSIS — Z01419 Encounter for gynecological examination (general) (routine) without abnormal findings: Secondary | ICD-10-CM | POA: Diagnosis not present

## 2017-01-12 DIAGNOSIS — E782 Mixed hyperlipidemia: Secondary | ICD-10-CM | POA: Diagnosis not present

## 2017-01-13 LAB — LIPID PANEL
Chol/HDL Ratio: 3.8 ratio (ref 0.0–4.4)
Cholesterol, Total: 200 mg/dL — ABNORMAL HIGH (ref 100–199)
HDL: 52 mg/dL (ref >39)
LDL Calculated: 132 mg/dL — ABNORMAL HIGH (ref 0–99)
Triglycerides: 78 mg/dL (ref 0–149)
VLDL Cholesterol Cal: 16 mg/dL (ref 5–40)

## 2017-01-13 LAB — HEPATIC FUNCTION PANEL
ALK PHOS: 75 IU/L (ref 39–117)
ALT: 27 IU/L (ref 0–32)
AST: 32 IU/L (ref 0–40)
Albumin: 4.4 g/dL (ref 3.5–4.8)
BILIRUBIN TOTAL: 0.3 mg/dL (ref 0.0–1.2)
BILIRUBIN, DIRECT: 0.07 mg/dL (ref 0.00–0.40)
Total Protein: 6.5 g/dL (ref 6.0–8.5)

## 2017-01-17 ENCOUNTER — Ambulatory Visit
Admission: RE | Admit: 2017-01-17 | Discharge: 2017-01-17 | Disposition: A | Discharge status: Home / Self Care | Payer: Medicare Other | Source: Ambulatory Visit | Attending: Family Medicine | Admitting: Family Medicine

## 2017-01-17 DIAGNOSIS — Z1231 Encounter for screening mammogram for malignant neoplasm of breast: Secondary | ICD-10-CM

## 2017-01-18 ENCOUNTER — Other Ambulatory Visit: Payer: Self-pay | Admitting: Family Medicine

## 2017-01-18 DIAGNOSIS — R928 Other abnormal and inconclusive findings on diagnostic imaging of breast: Secondary | ICD-10-CM

## 2017-01-24 ENCOUNTER — Ambulatory Visit
Admission: RE | Admit: 2017-01-24 | Discharge: 2017-01-24 | Disposition: A | Discharge status: Home / Self Care | Payer: Medicare Other | Source: Ambulatory Visit | Attending: Family Medicine | Admitting: Family Medicine

## 2017-01-24 ENCOUNTER — Ambulatory Visit: Payer: Medicare Other

## 2017-01-24 DIAGNOSIS — R928 Other abnormal and inconclusive findings on diagnostic imaging of breast: Secondary | ICD-10-CM

## 2017-01-24 DIAGNOSIS — R922 Inconclusive mammogram: Secondary | ICD-10-CM | POA: Diagnosis not present

## 2017-01-26 DIAGNOSIS — M7062 Trochanteric bursitis, left hip: Secondary | ICD-10-CM | POA: Diagnosis not present

## 2017-01-26 DIAGNOSIS — M25552 Pain in left hip: Secondary | ICD-10-CM | POA: Diagnosis not present

## 2017-02-03 DIAGNOSIS — H35342 Macular cyst, hole, or pseudohole, left eye: Secondary | ICD-10-CM | POA: Diagnosis not present

## 2017-02-03 DIAGNOSIS — H32 Chorioretinal disorders in diseases classified elsewhere: Secondary | ICD-10-CM | POA: Diagnosis not present

## 2017-02-03 DIAGNOSIS — H43813 Vitreous degeneration, bilateral: Secondary | ICD-10-CM | POA: Diagnosis not present

## 2017-02-03 DIAGNOSIS — Z79899 Other long term (current) drug therapy: Secondary | ICD-10-CM | POA: Diagnosis not present

## 2017-02-03 DIAGNOSIS — H35033 Hypertensive retinopathy, bilateral: Secondary | ICD-10-CM | POA: Diagnosis not present

## 2017-02-03 DIAGNOSIS — H2513 Age-related nuclear cataract, bilateral: Secondary | ICD-10-CM | POA: Diagnosis not present

## 2017-02-03 DIAGNOSIS — H43392 Other vitreous opacities, left eye: Secondary | ICD-10-CM | POA: Diagnosis not present

## 2017-02-03 DIAGNOSIS — H3540 Unspecified peripheral retinal degeneration: Secondary | ICD-10-CM | POA: Diagnosis not present

## 2017-02-03 DIAGNOSIS — H268 Other specified cataract: Secondary | ICD-10-CM | POA: Diagnosis not present

## 2017-02-03 DIAGNOSIS — H469 Unspecified optic neuritis: Secondary | ICD-10-CM | POA: Diagnosis not present

## 2017-03-03 DIAGNOSIS — M7062 Trochanteric bursitis, left hip: Secondary | ICD-10-CM | POA: Diagnosis not present

## 2017-03-09 DIAGNOSIS — G245 Blepharospasm: Secondary | ICD-10-CM | POA: Diagnosis not present

## 2017-03-25 ENCOUNTER — Ambulatory Visit (INDEPENDENT_AMBULATORY_CARE_PROVIDER_SITE_OTHER): Payer: Medicare Other | Admitting: Family Medicine

## 2017-03-25 ENCOUNTER — Encounter: Payer: Self-pay | Admitting: Family Medicine

## 2017-03-25 VITALS — BP 136/72 | Temp 97.8°F | Ht 62.5 in | Wt 130.2 lb

## 2017-03-25 DIAGNOSIS — B349 Viral infection, unspecified: Secondary | ICD-10-CM | POA: Diagnosis not present

## 2017-03-25 DIAGNOSIS — J019 Acute sinusitis, unspecified: Secondary | ICD-10-CM | POA: Diagnosis not present

## 2017-03-25 MED ORDER — AMOXICILLIN 500 MG PO TABS: 500.0000 mg | ORAL_TABLET | Freq: Three times a day (TID) | ORAL | 0 refills | Status: DC

## 2017-03-25 NOTE — Progress Notes (Signed)
   Subjective:    Patient ID: [name] [name], female    DOB: 1940-12-04, 76 y.o.   MRN: [id number]  Otalgia   There is pain in the left ear. This is a new problem. Associated symptoms include rhinorrhea. Pertinent negatives include no coughing. Treatments tried: Mucinex.  Patient relates head congestion drainage denies high fever chills sweats denies wheezing difficulty breathing states fullness in her left ear as well as a popping cracking noise in addition to this feels heavy in her head but no true vertigo symptoms    Review of Systems  Constitutional: Negative for activity change and fever.  HENT: Positive for congestion, ear pain and rhinorrhea.   Eyes: Negative for discharge.  Respiratory: Negative for cough, shortness of breath and wheezing.   Cardiovascular: Negative for chest pain.       Objective:   Physical Exam  Constitutional: She appears well-developed.  HENT:  Head: Normocephalic.  Right Ear: External ear normal.  Left Ear: External ear normal.  Nose: Nose normal.  Mouth/Throat: Oropharynx is clear and moist. No oropharyngeal exudate.  Eyes: Right eye exhibits no discharge. Left eye exhibits no discharge.  Neck: Neck supple. No tracheal deviation present.  Cardiovascular: Normal rate and normal heart sounds.  No murmur heard. Pulmonary/Chest: Effort normal and breath sounds normal. She has no wheezes. She has no rales.  Lymphadenopathy:    She has no cervical adenopathy.  Skin: Skin is warm and dry.  Nursing note and vitals reviewed.         Assessment & Plan:  Viral syndrome I do not find a bacterial infection currently Patient will be going out of town in a few days therefore I went ahead and gave her a prescription for antibiotics that if she gets worse and I told her what signs to watch for she will get the antibiotic prescription filled and take it currently does not need to take it follow-up if any trouble call us if any troubles

## 2017-04-05 ENCOUNTER — Encounter: Payer: Self-pay | Admitting: Nurse Practitioner

## 2017-04-07 DIAGNOSIS — H35352 Cystoid macular degeneration, left eye: Secondary | ICD-10-CM | POA: Diagnosis not present

## 2017-04-07 DIAGNOSIS — H43392 Other vitreous opacities, left eye: Secondary | ICD-10-CM | POA: Diagnosis not present

## 2017-04-07 DIAGNOSIS — H2513 Age-related nuclear cataract, bilateral: Secondary | ICD-10-CM | POA: Diagnosis not present

## 2017-04-07 DIAGNOSIS — H35342 Macular cyst, hole, or pseudohole, left eye: Secondary | ICD-10-CM | POA: Diagnosis not present

## 2017-04-07 DIAGNOSIS — H35033 Hypertensive retinopathy, bilateral: Secondary | ICD-10-CM | POA: Diagnosis not present

## 2017-04-07 DIAGNOSIS — Z79899 Other long term (current) drug therapy: Secondary | ICD-10-CM | POA: Diagnosis not present

## 2017-04-07 DIAGNOSIS — H268 Other specified cataract: Secondary | ICD-10-CM | POA: Diagnosis not present

## 2017-04-07 DIAGNOSIS — H469 Unspecified optic neuritis: Secondary | ICD-10-CM | POA: Diagnosis not present

## 2017-04-07 DIAGNOSIS — H43813 Vitreous degeneration, bilateral: Secondary | ICD-10-CM | POA: Diagnosis not present

## 2017-04-14 ENCOUNTER — Ambulatory Visit (INDEPENDENT_AMBULATORY_CARE_PROVIDER_SITE_OTHER): Payer: Medicare Other | Admitting: Otolaryngology

## 2017-04-14 DIAGNOSIS — H9202 Otalgia, left ear: Secondary | ICD-10-CM | POA: Diagnosis not present

## 2017-04-14 DIAGNOSIS — H6983 Other specified disorders of Eustachian tube, bilateral: Secondary | ICD-10-CM | POA: Diagnosis not present

## 2017-04-14 DIAGNOSIS — H903 Sensorineural hearing loss, bilateral: Secondary | ICD-10-CM

## 2017-04-14 DIAGNOSIS — R42 Dizziness and giddiness: Secondary | ICD-10-CM | POA: Diagnosis not present

## 2017-06-09 ENCOUNTER — Ambulatory Visit (INDEPENDENT_AMBULATORY_CARE_PROVIDER_SITE_OTHER): Payer: Medicare Other | Admitting: Otolaryngology

## 2017-06-09 DIAGNOSIS — H6983 Other specified disorders of Eustachian tube, bilateral: Secondary | ICD-10-CM | POA: Diagnosis not present

## 2017-06-09 DIAGNOSIS — G245 Blepharospasm: Secondary | ICD-10-CM | POA: Diagnosis not present

## 2017-06-13 DIAGNOSIS — H2513 Age-related nuclear cataract, bilateral: Secondary | ICD-10-CM | POA: Diagnosis not present

## 2017-06-13 DIAGNOSIS — H43813 Vitreous degeneration, bilateral: Secondary | ICD-10-CM | POA: Diagnosis not present

## 2017-06-13 DIAGNOSIS — H35342 Macular cyst, hole, or pseudohole, left eye: Secondary | ICD-10-CM | POA: Diagnosis not present

## 2017-06-20 DIAGNOSIS — H35342 Macular cyst, hole, or pseudohole, left eye: Secondary | ICD-10-CM | POA: Diagnosis not present

## 2017-06-20 DIAGNOSIS — H469 Unspecified optic neuritis: Secondary | ICD-10-CM | POA: Diagnosis not present

## 2017-06-20 DIAGNOSIS — H2513 Age-related nuclear cataract, bilateral: Secondary | ICD-10-CM | POA: Diagnosis not present

## 2017-06-20 DIAGNOSIS — H35033 Hypertensive retinopathy, bilateral: Secondary | ICD-10-CM | POA: Diagnosis not present

## 2017-06-20 DIAGNOSIS — H43392 Other vitreous opacities, left eye: Secondary | ICD-10-CM | POA: Diagnosis not present

## 2017-06-20 DIAGNOSIS — H43813 Vitreous degeneration, bilateral: Secondary | ICD-10-CM | POA: Diagnosis not present

## 2017-06-20 DIAGNOSIS — Z79899 Other long term (current) drug therapy: Secondary | ICD-10-CM | POA: Diagnosis not present

## 2017-06-20 DIAGNOSIS — H268 Other specified cataract: Secondary | ICD-10-CM | POA: Diagnosis not present

## 2017-06-30 ENCOUNTER — Encounter: Payer: Self-pay | Admitting: Physician Assistant

## 2017-06-30 ENCOUNTER — Other Ambulatory Visit: Payer: Self-pay | Admitting: Cardiology

## 2017-06-30 ENCOUNTER — Ambulatory Visit (INDEPENDENT_AMBULATORY_CARE_PROVIDER_SITE_OTHER): Payer: Medicare Other | Admitting: Physician Assistant

## 2017-06-30 VITALS — BP 118/70 | HR 72 | Ht 62.5 in | Wt 134.4 lb

## 2017-06-30 DIAGNOSIS — R141 Gas pain: Secondary | ICD-10-CM

## 2017-06-30 DIAGNOSIS — R1032 Left lower quadrant pain: Secondary | ICD-10-CM

## 2017-06-30 MED ORDER — DICYCLOMINE HCL 10 MG PO CAPS: ORAL_CAPSULE | ORAL | 3 refills | Status: DC

## 2017-06-30 NOTE — Patient Instructions (Addendum)
We have sent the following medications to your pharmacy for you to pick up at your convenience: Dicyclomine 10 mg-Take 20 30 minutes before breakfast and dinner.  Please change your fiber supplement from Benefiber to Citrucel to help with gas.   Please follow up with Dr Carlean Purl on 09/06/17 at 10:45 am.  If you are age 77 or older, your body mass index should be between 23-30. Your Body mass index is 24.19 kg/m. If this is out of the aforementioned range listed, please consider follow up with your Primary Care Provider.  If you are age 42 or younger, your body mass index should be between 19-25. Your Body mass index is 24.19 kg/m. If this is out of the aformentioned range listed, please consider follow up with your Primary Care Provider.

## 2017-06-30 NOTE — Progress Notes (Addendum)
Chief Complaint: Left-sided abdominal pain  HPI:    [name] [name] is a 77 year old female with a past medical history as listed below, who follows with Dr. Carlean Purl and presents to clinic today with a complaint of left-sided abdominal pain.    Last colonoscopy 12/18/12 with moderate diverticulosis in the sigmoid colon and otherwise normal.    Last seen 09/22/16 by Dr. Carlean Purl with left lower quadrant abdominal pain.  A CT of the abdomen pelvis was ordered.  Also patient offered dicyclomine which she declined.  CT 09/29/16 showed sigmoid diverticulosis with no evidence of diverticulitis and stable bilateral renal parenchymal scarring.  Aortic atherosclerosis.    Today, describes 2 weeks of the left lower quadrant discomfort off and on rates this is a 2-3/10 which comes and goes, "cramping sensation".  Sometimes worse when walking in the morning.     Also describes some variance in her bowel habits which are much better over the past year since being seen last with the use of Benefiber and high-fiber diet on a daily basis.  Does have daily bowel movements although sometimes they are just "balls with mucus and other times can be a little looser", overall has normal bowel movements.      A lot of excess gas, especially when walking her 2-1/2 miles every morning.    Past medical history significant for lymphoma of her abdomen more than 10 years ago, due to this patient has increased anxiety regarding any abdominal pain. Recently started on Methotrexate for an "eye problem".    Denies fever, chills, weight loss, anorexia, nausea, vomiting, heartburn, reflux, blood in her stool or symptoms that awaken her at night.  Past Medical History:  Diagnosis Date  . Anal sphincter incompetence 12/31/2015   Manometry 12/2015  . Brady-tachy syndrome ([place])   . Chest pain 10/16/13   normal coronary arteries on cath and normal EF  . Colitis, ischemic ([place])   . Colon polyp 07/29/1993   with focal adenomatous changes  .  Diverticulitis   . Diverticulosis of colon   . Dyspepsia   . Dyssynergic defecation 12/31/2015   Anorectal mano 12/2015, also has rectal hypersensitivity  . Fatty liver   . HLD (hyperlipidemia)   . Hypertension   . IBS (irritable bowel syndrome)   . Incontinence, feces   . MVP (mitral valve prolapse)   . nhl dx'd 07/1999   chemo comp 2001; rituxin comp 2005  . Osteopenia   . Palpitation   . Rectocele   . Rectocele   . Vasovagal syncope     Past Surgical History:  Procedure Laterality Date  . ABDOMINAL HYSTERECTOMY    . ABDOMINAL SURGERY  2001    for non hodgkins lymphoma; lymphoma in mesentary  . ANAL RECTAL MANOMETRY N/A 12/24/2015   Procedure: ANO RECTAL MANOMETRY;  Surgeon: Gatha Mayer, MD;  Location: [facility];  Service: Endoscopy;  Laterality: N/A;  . BLADDER SUSPENSION    . BUNIONECTOMY    . CHOLECYSTECTOMY  2003  . COLONOSCOPY  7/200/, 11/2007   diverticulosis  . LEFT HEART CATHETERIZATION WITH CORONARY ANGIOGRAM N/A 10/16/2013   Procedure: LEFT HEART CATHETERIZATION WITH CORONARY ANGIOGRAM;  Surgeon: Peter M Martinique, MD;  Location: [facility] CATH LAB;  Service: Cardiovascular;  Laterality: N/A;  . pelvic prolapse repair    . UPPER GASTROINTESTINAL ENDOSCOPY  10/2005   hiatal hernia    Current Outpatient Medications  Medication Sig Dispense Refill  . ALPRAZolam (XANAX) 0.25 MG tablet Take 1 tablet (0.25  mg total) by mouth daily as needed for anxiety. 30 tablet 2  . amoxicillin (AMOXIL) 500 MG tablet Take 1 tablet (500 mg total) by mouth 3 (three) times daily. 30 tablet 0  . aspirin EC 81 MG tablet Take 81 mg by mouth daily.    Marland Kitchen atenolol (TENORMIN) 25 MG tablet Take 1 tablet (25 mg total) by mouth daily. 90 tablet 3  . clobetasol ointment (TEMOVATE) 3.50 % Apply 1 application topically daily as needed (dermatitis).     . fexofenadine (ALLEGRA) 180 MG tablet Take 180 mg by mouth daily. Reported on 0/93/8182    . folic acid (FOLVITE) 1 MG tablet Take 1 mg by mouth.    .  methotrexate (RHEUMATREX) 2.5 MG tablet Take 10 mg by mouth.    . Polyvinyl Alcohol-Povidone (REFRESH OP) Apply 1 drop to eye 3 (three) times daily.    . Probiotic Product (ALIGN PO) Take by mouth as directed. Alternates between [place] per PCP orders every few months    . Probiotic Product ([place]) CAPS Take 1 capsule by mouth daily.     No current facility-administered medications for this visit.     Allergies as of 06/30/2017 - Review Complete 03/25/2017  Allergen Reaction Noted  . Pamine forte [methscopolamine] Other (See Comments)   . Robinul [glycopyrrolate] Other (See Comments)   . Sulfa antibiotics Swelling and Anaphylaxis 01/08/2011  . Sulfonamide derivatives Swelling   . Ace inhibitors Hives 10/16/2013  . Codeine Nausea Only   . Iodine Hives   . Iohexol Hives 02/16/2003    Family History  Problem Relation Age of Onset  . Colon cancer Father 47  . Prostate cancer Brother   . Colon polyps Brother   . Colon polyps Brother   . Stomach cancer Maternal Uncle   . Esophageal cancer Neg Hx   . Pancreatic cancer Neg Hx   . Liver disease Neg Hx     Social History   Socioeconomic History  . Marital status: Widowed    Spouse name: Not on file  . Number of children: 1  . Years of education: Not on file  . Highest education level: Not on file  Social Needs  . Financial resource strain: Not on file  . Food insecurity - worry: Not on file  . Food insecurity - inability: Not on file  . Transportation needs - medical: Not on file  . Transportation needs - non-medical: Not on file  Occupational History  . 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 Smoking status: Former Smoker    Packs/day: 0.33  . Smokeless tobacco: Never Used  . Tobacco comment: quit 1976  Substance and Sexual Activity  . Alcohol use: Yes    Alcohol/week: 0.0 oz    Comment: very rare  . Drug use: No  . Sexual activity: Yes    Birth  control/protection: Surgical  Other Topics Concern  . Not on file  Social History Narrative  . Not on file    Review of Systems:    Constitutional: No weight loss, fever or chills Cardiovascular: No chest pain   Respiratory: No SOB Gastrointestinal: See HPI and otherwise negative   Physical Exam:  Vital signs: BP 118/70   Pulse 72   Ht 5' 2.5" (1.588 m)   Wt 134 lb 6.4 oz (61 kg)   BMI 24.19 kg/m   Constitutional:   Pleasant Caucasian female appears to be in NAD,  Well developed, Well nourished, alert and cooperative Respiratory: Respirations even and unlabored. Lungs clear to auscultation bilaterally.   No wheezes, crackles, or rhonchi.  Cardiovascular: Normal S1, S2. No MRG. Regular rate and rhythm. No peripheral edema, cyanosis or pallor.  Gastrointestinal:  Soft, nondistended,Mild ttp LLQ and suprapubic No rebound or guarding. Normal bowel sounds. No appreciable masses or hepatomegaly. Psychiatric:  Demonstrates good judgement and reason without abnormal affect or behaviors.  No recent labs or imaging.  Assessment: 1.  Left lower quadrant tenderness: For the past 2 weeks, intermittent, can last just a few seconds or a few minutes, does not always result in a bowel movement; most likely IBS 2.  Gas/bloating: Increased recently, patient has also increased fiber in her diet and has history of irritable bowel syndrome  Plan: 1.  Discussed the patient could trial switching from Benefiber to Citrucel as sometimes this causes less gas.  Gas can be a natural side effect from an increased fiber diet. 2.  Prescribed Dicyclomine 10 mg to be taken twice daily, 20-30 minutes before breakfast and dinner #60 with a refill. 3.  Discussed with patient that if pain increases, she starts seeing blood in her stool or having nausea or vomiting she needs to let us know.  Would have a low threshold to order a repeat CT abdomen pelvis with contrast as patient has a history of lymphoma and has recently  been started on Methotrexate. 4.  Patient would like to have discussion regarding a colonoscopy this year.  Per Dr. Celesta Aver last note, he would like to speak with her before scheduling. 5.  Scheduled patient an office visit with Dr. Carlean Purl at his next available to have discussion regarding colonoscopy and this left lower quadrant pain if it continues.  [name] Newer, PA-C [facility] 06/30/2017, 10:45 AM  Cc: Mikey Kirschner, MD   Agree with Ms. Shalana Jardin's evaluation and management.  Gatha Mayer, MD, [facility]

## 2017-07-22 ENCOUNTER — Other Ambulatory Visit: Payer: Self-pay | Admitting: Internal Medicine

## 2017-08-22 DIAGNOSIS — H35033 Hypertensive retinopathy, bilateral: Secondary | ICD-10-CM | POA: Diagnosis not present

## 2017-08-22 DIAGNOSIS — H268 Other specified cataract: Secondary | ICD-10-CM | POA: Diagnosis not present

## 2017-08-22 DIAGNOSIS — Z79899 Other long term (current) drug therapy: Secondary | ICD-10-CM | POA: Diagnosis not present

## 2017-08-22 DIAGNOSIS — Z8572 Personal history of non-Hodgkin lymphomas: Secondary | ICD-10-CM | POA: Diagnosis not present

## 2017-08-22 DIAGNOSIS — H43392 Other vitreous opacities, left eye: Secondary | ICD-10-CM | POA: Diagnosis not present

## 2017-08-22 DIAGNOSIS — H43813 Vitreous degeneration, bilateral: Secondary | ICD-10-CM | POA: Diagnosis not present

## 2017-08-22 DIAGNOSIS — H35342 Macular cyst, hole, or pseudohole, left eye: Secondary | ICD-10-CM | POA: Diagnosis not present

## 2017-08-22 DIAGNOSIS — H2513 Age-related nuclear cataract, bilateral: Secondary | ICD-10-CM | POA: Diagnosis not present

## 2017-08-22 DIAGNOSIS — Z87891 Personal history of nicotine dependence: Secondary | ICD-10-CM | POA: Diagnosis not present

## 2017-08-22 DIAGNOSIS — H469 Unspecified optic neuritis: Secondary | ICD-10-CM | POA: Diagnosis not present

## 2017-09-06 ENCOUNTER — Encounter: Payer: Self-pay | Admitting: Internal Medicine

## 2017-09-06 ENCOUNTER — Ambulatory Visit (INDEPENDENT_AMBULATORY_CARE_PROVIDER_SITE_OTHER): Payer: Medicare Other | Admitting: Internal Medicine

## 2017-09-06 VITALS — BP 112/74 | HR 68 | Ht 63.0 in | Wt 135.1 lb

## 2017-09-06 DIAGNOSIS — K580 Irritable bowel syndrome with diarrhea: Secondary | ICD-10-CM

## 2017-09-06 DIAGNOSIS — Z800 Family history of malignant neoplasm of digestive organs: Secondary | ICD-10-CM

## 2017-09-06 DIAGNOSIS — Z8371 Family history of colonic polyps: Secondary | ICD-10-CM | POA: Diagnosis not present

## 2017-09-06 NOTE — Patient Instructions (Signed)
You have been scheduled for a colonoscopy. Please follow written instructions given to you at your visit today.  Please pick up your prep supplies at the pharmacy. If you use inhalers (even only as needed), please bring them with you on the day of your procedure.   I appreciate the opportunity to care for you. Carl Gessner, MD, FACG 

## 2017-09-06 NOTE — Progress Notes (Signed)
[name] [name] 77 y.o. 18-Jul-1940 [id number]  Assessment & Plan:   Encounter Diagnoses  Name Primary?  . Family hx of colon cancer Yes  . Family hx colonic polyps   . Irritable bowel syndrome with diarrhea     IBS doing well.  She has concerns about colon cancer risks given FHx and IBS sx - also does have a remote (1990's) hx of adenoma  Have decided to do a colonoscopy and unless major findings would not do further routine colonoscopy  I appreciate the opportunity to care for her. PI:RJJOAC, Grace Bushy, MD  Subjective:   Chief Complaint: f/u IBS, schedule colonoscopy  HPI Patient is here for follow-up after she saw Maryelizabeth Kaufmann, PA-C in February, she changed her fiber preparation to fiber one at that time and with that regimen and her probiotic she is doing quite well.  Prior pelvic floor physical therapy helped quite a bit with her IBS also.  She has a family history of colon cancer in the elderly father and 2 brothers had colon polyps as well, and in the past she had been on an every five-year regimen.  I said we would revisit things now, her 5-year anniversary will be in August of this year.  She is inclined to repeat a colonoscopy if she can at this time. Allergies  Allergen Reactions  . Pamine Forte [Methscopolamine] Other (See Comments)    Unable to urinate  . Robinul [Glycopyrrolate] Other (See Comments)    Unable to urinate  . Sulfa Antibiotics Swelling and Anaphylaxis    And face swelling Face swelling   . Sulfonamide Derivatives Swelling    Face swelling   . Ace Inhibitors Hives  . Codeine Nausea Only  . Iodine Hives  . Iohexol Hives     Desc: 50 mg benadryl prior to exam    Current Meds  Medication Sig  . ALPRAZolam (XANAX) 0.25 MG tablet Take 1 tablet (0.25 mg total) by mouth daily as needed for anxiety.  Marland Kitchen aspirin EC 81 MG tablet Take 81 mg by mouth daily.  Marland Kitchen atenolol (TENORMIN) 25 MG tablet TAKE ONE TABLET BY MOUTH DAILY.  . clobetasol  ointment (TEMOVATE) 1.66 % Apply 1 application topically daily as needed (dermatitis).   . fexofenadine (ALLEGRA) 180 MG tablet Take 180 mg by mouth daily. Reported on 0/63/0160  . folic acid (FOLVITE) 1 MG tablet Take 1 mg by mouth.  . methotrexate (RHEUMATREX) 2.5 MG tablet Take 10 mg by mouth.  . NON FORMULARY Fiber one- one heaping tablespoon nightly  . pantoprazole (PROTONIX) 40 MG tablet TAKE ONE (1) TABLET BY MOUTH EVERY DAY BEFORE BREAKFAST  . Polyvinyl Alcohol-Povidone (REFRESH OP) Apply 1 drop to eye 3 (three) times daily.  . Probiotic Product (ALIGN PO) Take by mouth as directed. Alternates between [place] per PCP orders every few months  . Probiotic Product ([place]) CAPS Take 1 capsule by mouth daily.     Fiber One 1 tbsp daily  Past Medical History:  Diagnosis Date  . Anal sphincter incompetence 12/31/2015   Manometry 12/2015  . Brady-tachy syndrome ([place])   . Chest pain 10/16/13   normal coronary arteries on cath and normal EF  . Colitis, ischemic ([place])   . Colon polyp 07/29/1993   with focal adenomatous changes  . Diverticulitis   . Diverticulosis of colon   . Dyspepsia   . Dyssynergic defecation 12/31/2015   Anorectal mano 12/2015, also has rectal hypersensitivity  .  Fatty liver   . HLD (hyperlipidemia)   . Hypertension   . IBS (irritable bowel syndrome)   . Incontinence, feces   . MVP (mitral valve prolapse)   . nhl dx'd 07/1999   chemo comp 2001; rituxin comp 2005  . Osteopenia   . Palpitation   . Rectocele   . Rectocele   . Vasovagal syncope    Past Surgical History:  Procedure Laterality Date  . ABDOMINAL HYSTERECTOMY    . ABDOMINAL SURGERY  2001    for non hodgkins lymphoma; lymphoma in mesentary  . ANAL RECTAL MANOMETRY N/A 12/24/2015   Procedure: ANO RECTAL MANOMETRY;  Surgeon: Gatha Mayer, MD;  Location: [facility];  Service: Endoscopy;  Laterality: N/A;  . BLADDER SUSPENSION    . BUNIONECTOMY    .  CHOLECYSTECTOMY  2003  . COLONOSCOPY  7/200/, 11/2007   diverticulosis  . LEFT HEART CATHETERIZATION WITH CORONARY ANGIOGRAM N/A 10/16/2013   Procedure: LEFT HEART CATHETERIZATION WITH CORONARY ANGIOGRAM;  Surgeon: Peter M Martinique, MD;  Location: [facility] CATH LAB;  Service: Cardiovascular;  Laterality: N/A;  . pelvic prolapse repair    . UPPER GASTROINTESTINAL ENDOSCOPY  10/2005   hiatal hernia   Social History   Social History Narrative   Retired, widowed, 1 child   Rare EtOH, former smoker none now   No drugs   family history includes Colon cancer (age of onset: 78) in her father; Colon polyps in her brother and brother; Prostate cancer in her brother; Stomach cancer in her maternal grandfather and maternal uncle.   Review of Systems As above  Objective:   Physical Exam NAD WDWN BP 112/74   Pulse 68   Ht 5\' 3"  (1.6 m)   Wt 135 lb 2 oz (61.3 kg)   BMI 23.94 kg/m

## 2017-09-08 ENCOUNTER — Ambulatory Visit (INDEPENDENT_AMBULATORY_CARE_PROVIDER_SITE_OTHER): Payer: Medicare Other | Admitting: Nurse Practitioner

## 2017-09-08 ENCOUNTER — Encounter: Payer: Self-pay | Admitting: Nurse Practitioner

## 2017-09-08 VITALS — BP 136/74 | Ht 62.5 in | Wt 135.0 lb

## 2017-09-08 DIAGNOSIS — M25571 Pain in right ankle and joints of right foot: Secondary | ICD-10-CM | POA: Diagnosis not present

## 2017-09-08 DIAGNOSIS — R609 Edema, unspecified: Secondary | ICD-10-CM | POA: Diagnosis not present

## 2017-09-08 DIAGNOSIS — Z79899 Other long term (current) drug therapy: Secondary | ICD-10-CM | POA: Diagnosis not present

## 2017-09-09 ENCOUNTER — Encounter: Payer: Self-pay | Admitting: Nurse Practitioner

## 2017-09-09 LAB — CBC WITH DIFFERENTIAL/PLATELET
BASOS: 1 %
Basophils Absolute: 0.1 10*3/uL (ref 0.0–0.2)
EOS (ABSOLUTE): 0.3 10*3/uL (ref 0.0–0.4)
Eos: 4 %
HEMOGLOBIN: 13.2 g/dL (ref 11.1–15.9)
Hematocrit: 38.4 % (ref 34.0–46.6)
IMMATURE GRANS (ABS): 0 10*3/uL (ref 0.0–0.1)
Immature Granulocytes: 0 %
LYMPHS: 20 %
Lymphocytes Absolute: 1.6 10*3/uL (ref 0.7–3.1)
MCH: 31 pg (ref 26.6–33.0)
MCHC: 34.4 g/dL (ref 31.5–35.7)
MCV: 90 fL (ref 79–97)
MONOCYTES: 14 %
Monocytes Absolute: 1.1 10*3/uL — ABNORMAL HIGH (ref 0.1–0.9)
NEUTROS ABS: 4.8 10*3/uL (ref 1.4–7.0)
Neutrophils: 61 %
Platelets: 274 10*3/uL (ref 150–379)
RBC: 4.26 x10E6/uL (ref 3.77–5.28)
RDW: 14.6 % (ref 12.3–15.4)
WBC: 7.9 10*3/uL (ref 3.4–10.8)

## 2017-09-09 LAB — BASIC METABOLIC PANEL
BUN / CREAT RATIO: 19 (ref 12–28)
BUN: 15 mg/dL (ref 8–27)
CALCIUM: 10.8 mg/dL — AB (ref 8.7–10.3)
CHLORIDE: 103 mmol/L (ref 96–106)
CO2: 25 mmol/L (ref 20–29)
CREATININE: 0.79 mg/dL (ref 0.57–1.00)
GFR, EST AFRICAN AMERICAN: 84 mL/min/{1.73_m2} (ref >59)
GFR, EST NON AFRICAN AMERICAN: 73 mL/min/{1.73_m2} (ref >59)
Glucose: 126 mg/dL — ABNORMAL HIGH (ref 65–99)
POTASSIUM: 5 mmol/L (ref 3.5–5.2)
Sodium: 142 mmol/L (ref 134–144)

## 2017-09-09 LAB — URIC ACID: Uric Acid: 4.8 mg/dL (ref 2.5–7.1)

## 2017-09-09 LAB — HEPATIC FUNCTION PANEL
ALT: 39 IU/L — AB (ref 0–32)
AST: 34 IU/L (ref 0–40)
Albumin: 4.4 g/dL (ref 3.5–4.8)
Alkaline Phosphatase: 76 IU/L (ref 39–117)
Bilirubin Total: <0.2 mg/dL (ref 0.0–1.2)
Bilirubin, Direct: 0.06 mg/dL (ref 0.00–0.40)
Total Protein: 6.1 g/dL (ref 6.0–8.5)

## 2017-09-09 LAB — SEDIMENTATION RATE: SED RATE: 2 mm/h (ref 0–40)

## 2017-09-09 NOTE — Progress Notes (Signed)
Subjective: Presents for complaints of off-and-on swelling in both ankles for the past couple of weeks.  Occurs off and on.  Tenderness at times on certain areas of the feet particularly the right foot for the past week.  A few scattered small areas of erythema.  No history of injury.  Symptoms are better today.  Currently on methotrexate 15 mg for her ophthalmology issues.  Noticed the foot symptoms after her dose was increased.  The main area of pain continues to be the long second toe on her right foot which she states has bothered her off-and-on in the past.  Wears well fitting comfortable shoes.  No new shoes.  No fevers.  No other joint involvement.  No chest pain/ischemic type pain or shortness of breath.  No orthopnea.  Objective:   BP 136/74   Ht 5' 2.5" (1.588 m)   Wt 135 lb 0.6 oz (61.3 kg)   BMI 24.31 kg/m  NAD.  Alert, oriented.  Lungs clear.  Heart regular rate and rhythm.  Bilateral feet and ankles: No edema noted.  A tiny area of erythema noted on the right lateral foot, nontender to palpation.  Minimal tenderness with palpation along the top of the foot near the toes.  No tenderness or masses noted at the base of the toes, no evidence of neuroma.  Gait normal.  Strong DP pulses bilaterally.  Toes warm with normal capillary refill.  Assessment:  Peripheral edema - Plan: CBC with Differential/Platelet, Uric acid, Sedimentation rate  Arthralgia of right foot - Plan: CBC with Differential/Platelet, Uric acid, Basic metabolic panel, Sedimentation rate  High risk medication use - Plan: Hepatic function panel, Sedimentation rate    Plan: Patient is on methotrexate for a very complex ophthalmological issue which has shown some improvement.  A review of her specialist notes indicates they are aware of her history of lymphoma.  Encourage patient to discuss any concerns she has with her specialist before stopping her medication.  Lab work pending.  Although there is no overt symptoms of gout,  uric acid level was ordered due to patient's concerns.  Warning signs reviewed.  Call back if symptoms worsen or persist. 25 minutes was spent with the patient.  This statement verifies that 25 minutes was indeed spent with the patient. Greater than half the time was spent in discussion, counseling and answering questions  regarding the issues that the patient came in for today as reflected in the diagnosis (s) please refer to documentation for further details.

## 2017-09-12 DIAGNOSIS — G245 Blepharospasm: Secondary | ICD-10-CM | POA: Diagnosis not present

## 2017-11-02 ENCOUNTER — Encounter: Payer: Self-pay | Admitting: Family Medicine

## 2017-11-02 ENCOUNTER — Ambulatory Visit (INDEPENDENT_AMBULATORY_CARE_PROVIDER_SITE_OTHER): Payer: Medicare Other | Admitting: Family Medicine

## 2017-11-02 VITALS — BP 130/70 | Ht 63.0 in | Wt 133.0 lb

## 2017-11-02 DIAGNOSIS — K5792 Diverticulitis of intestine, part unspecified, without perforation or abscess without bleeding: Secondary | ICD-10-CM

## 2017-11-02 MED ORDER — METRONIDAZOLE 500 MG PO TABS: 500.0000 mg | ORAL_TABLET | Freq: Three times a day (TID) | ORAL | 0 refills | Status: DC

## 2017-11-02 MED ORDER — CIPROFLOXACIN HCL 500 MG PO TABS: 500.0000 mg | ORAL_TABLET | Freq: Two times a day (BID) | ORAL | 0 refills | Status: AC

## 2017-11-02 NOTE — Progress Notes (Signed)
   Subjective:    Patient ID: [name] [name], female    DOB: 07/12/1940, 77 y.o.   MRN: [id number]  HPI  Patient is here today to follow up on her diverticulitis.She states she is having discomfort in abd. She is having some loose stools for the last two days. She took lomotil on Monday and started a soft diet.   Low abd discomfort  doiminished energy  Loose stools  No fever   Appetite not the best  Backed off to liquids Monday  Started with rice and yogurt and soft foods  Eating chick  Soup   Review of Systems No headache, no major weight loss or weight gain, no chest pain no back pain abdominal pain no change in bowel habits complete ROS otherwise negative     Objective:   Physical Exam  Alert vitals stable, NAD. Blood pressure good on repeat. HEENT normal. Lungs clear. Heart regular rate and rhythm. Abdomen distinct left lower quadrant tenderness no rebound no guarding excellent bowel sounds      Assessment & Plan:  Impression probable acute diverticulitis.  Up-to-date on colonoscopy screening warning signs discussed.  Appropriate antibiotics initiated.  Antibiotics rationale discussed

## 2017-11-11 ENCOUNTER — Encounter: Payer: Self-pay | Admitting: Nurse Practitioner

## 2017-11-24 ENCOUNTER — Ambulatory Visit (AMBULATORY_SURGERY_CENTER): Payer: Medicare Other | Admitting: Internal Medicine

## 2017-11-24 ENCOUNTER — Encounter: Payer: Self-pay | Admitting: Internal Medicine

## 2017-11-24 VITALS — BP 125/72 | HR 166 | Temp 97.1°F | Resp 17 | Ht 63.0 in | Wt 135.0 lb

## 2017-11-24 DIAGNOSIS — K219 Gastro-esophageal reflux disease without esophagitis: Secondary | ICD-10-CM | POA: Diagnosis not present

## 2017-11-24 DIAGNOSIS — I10 Essential (primary) hypertension: Secondary | ICD-10-CM | POA: Diagnosis not present

## 2017-11-24 DIAGNOSIS — D120 Benign neoplasm of cecum: Secondary | ICD-10-CM | POA: Diagnosis not present

## 2017-11-24 DIAGNOSIS — Z800 Family history of malignant neoplasm of digestive organs: Secondary | ICD-10-CM

## 2017-11-24 DIAGNOSIS — Z1211 Encounter for screening for malignant neoplasm of colon: Secondary | ICD-10-CM

## 2017-11-24 DIAGNOSIS — I739 Peripheral vascular disease, unspecified: Secondary | ICD-10-CM | POA: Diagnosis not present

## 2017-11-24 DIAGNOSIS — R1032 Left lower quadrant pain: Secondary | ICD-10-CM | POA: Diagnosis not present

## 2017-11-24 MED ORDER — SODIUM CHLORIDE 0.9 % IV SOLN: 500.0000 mL | Freq: Once | INTRAVENOUS | Status: DC

## 2017-11-24 NOTE — Progress Notes (Signed)
Report to PACU, RN, vss, BBS= Clear.  

## 2017-11-24 NOTE — Progress Notes (Signed)
Called to room to assist during endoscopic procedure.  Patient ID and intended procedure confirmed with present staff. Received instructions for my participation in the procedure from the performing physician.  

## 2017-11-24 NOTE — Op Note (Signed)
[place] Patient Name: [name] [name] Procedure Date: 11/24/2017 8:01 AM MRN: [id number] Endoscopist: Gatha Mayer , MD Age: 77 Referring MD:  Date of Birth: 07-23-1940 Gender: Female Account #: [id number] Procedure:                Colonoscopy Indications:              Screening in patient at increased risk: Family                            history of 1st-degree relative with colorectal                            cancer Medicines:                Propofol per Anesthesia, Monitored Anesthesia Care Procedure:                Pre-Anesthesia Assessment:                           - Prior to the procedure, a History and Physical                            was performed, and patient medications and                            allergies were reviewed. The patient's tolerance of                            previous anesthesia was also reviewed. The risks                            and benefits of the procedure and the sedation                            options and risks were discussed with the patient.                            All questions were answered, and informed consent                            was obtained. Prior Anticoagulants: The patient has                            taken no previous anticoagulant or antiplatelet                            agents. ASA Grade Assessment: III - A patient with                            severe systemic disease. After reviewing the risks                            and benefits, the patient was deemed in  satisfactory condition to undergo the procedure.                           After obtaining informed consent, the colonoscope                            was passed under direct vision. Throughout the                            procedure, the patient's blood pressure, pulse, and                            oxygen saturations were monitored continuously. The                            Model PCF-[id number] [id number])  scope was introduced                            through the anus and advanced to the the cecum,                            identified by appendiceal orifice and ileocecal                            valve. The colonoscopy was somewhat difficult due                            to significant looping. Successful completion of                            the procedure was aided by applying abdominal                            pressure. The patient tolerated the procedure well.                            The quality of the bowel preparation was excellent.                            The ileocecal valve, appendiceal orifice, and                            rectum were photographed. The bowel preparation                            used was Miralax. Scope In: 8:11:59 AM Scope Out: 8:26:46 AM Scope Withdrawal Time: 0 hours 9 minutes 18 seconds  Total Procedure Duration: 0 hours 14 minutes 47 seconds  Findings:                 The perianal and digital rectal examinations were                            normal.  A diminutive polyp was found in the cecum. The                            polyp was sessile. The polyp was removed with a                            cold snare. Resection and retrieval were complete.                            Verification of patient identification for the                            specimen was done. Estimated blood loss was minimal.                           Multiple diverticula were found in the sigmoid                            colon.                           The exam was otherwise without abnormality on                            direct and retroflexion views. Complications:            No immediate complications. Estimated Blood Loss:     Estimated blood loss was minimal. Impression:               - One diminutive polyp in the cecum, removed with a                            cold snare. Resected and retrieved.                           -  Diverticulosis in the sigmoid colon.                           - The examination was otherwise normal on direct                            and retroflexion views. Recommendation:           - Patient has a contact number available for                            emergencies. The signs and symptoms of potential                            delayed complications were discussed with the                            patient. Return to normal activities tomorrow.                            Written  discharge instructions were provided to the                            patient.                           - Resume previous diet.                           - Continue present medications.                           - No repeat colonoscopy likely due to current age                            (57 years or older) and only 1 diminutive polyp. Gatha Mayer, MD 11/24/2017 8:33:33 AM This report has been signed electronically.

## 2017-11-24 NOTE — Patient Instructions (Addendum)
   I found and removed one tiny polyp.  I will let you know what it was - I doubt it will lead to a recommendation for routine repeat colonoscopy.  I appreciate the opportunity to care for you. Gatha Mayer, MD, FACG  YOU HAD AN ENDOSCOPIC PROCEDURE TODAY AT [facility]:   Refer to the procedure report that was given to you for any specific questions about what was found during the examination.  If the procedure report does not answer your questions, please call your gastroenterologist to clarify.  If you requested that your care partner not be given the details of your procedure findings, then the procedure report has been included in a sealed envelope for you to review at your convenience later.  YOU SHOULD EXPECT: Some feelings of bloating in the abdomen. Passage of more gas than usual.  Walking can help get rid of the air that was put into your GI tract during the procedure and reduce the bloating. If you had a lower endoscopy (such as a colonoscopy or flexible sigmoidoscopy) you may notice spotting of blood in your stool or on the toilet paper. If you underwent a bowel prep for your procedure, you may not have a normal bowel movement for a few days.  Please Note:  You might notice some irritation and congestion in your nose or some drainage.  This is from the oxygen used during your procedure.  There is no need for concern and it should clear up in a day or so.  SYMPTOMS TO REPORT IMMEDIATELY:   Following lower endoscopy (colonoscopy or flexible sigmoidoscopy):  Excessive amounts of blood in the stool  Significant tenderness or worsening of abdominal pains  Swelling of the abdomen that is new, acute  Fever of 100F or higher   For urgent or emergent issues, a gastroenterologist can be reached at any hour by calling [phone].   DIET:  We do recommend a small meal at first, but then you may proceed to your regular diet.  Drink plenty of fluids but you  should avoid alcoholic beverages for 24 hours.  ACTIVITY:  You should plan to take it easy for the rest of today and you should NOT DRIVE or use heavy machinery until tomorrow (because of the sedation medicines used during the test).    FOLLOW UP: Our staff will call the number listed on your records the next business day following your procedure to check on you and address any questions or concerns that you may have regarding the information given to you following your procedure. If we do not reach you, we will leave a message.  However, if you are feeling well and you are not experiencing any problems, there is no need to return our call.  We will assume that you have returned to your regular daily activities without incident.  If any biopsies were taken you will be contacted by phone or by letter within the next 1-3 weeks.  Please call us at [phone] if you have not heard about the biopsies in 3 weeks.    SIGNATURES/CONFIDENTIALITY: You and/or your care partner have signed paperwork which will be entered into your electronic medical record.  These signatures attest to the fact that that the information above on your After Visit Summary has been reviewed and is understood.  Full responsibility of the confidentiality of this discharge information lies with you and/or your care-partner.

## 2017-11-24 NOTE — Progress Notes (Signed)
History reviewed by Red Christians, RN

## 2017-11-29 ENCOUNTER — Telehealth: Payer: Self-pay

## 2017-11-29 NOTE — Telephone Encounter (Signed)
Left message, did not get patient when called.

## 2017-11-29 NOTE — Telephone Encounter (Signed)
Left message to call if having any issues or concerns, B.Oskar Cretella RN. 

## 2017-11-30 ENCOUNTER — Encounter: Payer: Self-pay | Admitting: Internal Medicine

## 2017-11-30 NOTE — Progress Notes (Signed)
Diminutive adenoma No recall - age My Chart

## 2017-12-05 DIAGNOSIS — M79671 Pain in right foot: Secondary | ICD-10-CM | POA: Diagnosis not present

## 2017-12-13 ENCOUNTER — Other Ambulatory Visit: Payer: Self-pay | Admitting: Cardiology

## 2017-12-14 ENCOUNTER — Other Ambulatory Visit: Payer: Self-pay | Admitting: Family Medicine

## 2017-12-14 DIAGNOSIS — Z1231 Encounter for screening mammogram for malignant neoplasm of breast: Secondary | ICD-10-CM

## 2017-12-16 DIAGNOSIS — G245 Blepharospasm: Secondary | ICD-10-CM | POA: Diagnosis not present

## 2017-12-28 DIAGNOSIS — Z6823 Body mass index (BMI) 23.0-23.9, adult: Secondary | ICD-10-CM | POA: Diagnosis not present

## 2017-12-28 DIAGNOSIS — M810 Age-related osteoporosis without current pathological fracture: Secondary | ICD-10-CM | POA: Diagnosis not present

## 2017-12-28 DIAGNOSIS — M150 Primary generalized (osteo)arthritis: Secondary | ICD-10-CM | POA: Diagnosis not present

## 2018-01-03 DIAGNOSIS — H468 Other optic neuritis: Secondary | ICD-10-CM | POA: Diagnosis not present

## 2018-01-03 DIAGNOSIS — H268 Other specified cataract: Secondary | ICD-10-CM | POA: Diagnosis not present

## 2018-01-03 DIAGNOSIS — Z79899 Other long term (current) drug therapy: Secondary | ICD-10-CM | POA: Diagnosis not present

## 2018-01-03 DIAGNOSIS — H35033 Hypertensive retinopathy, bilateral: Secondary | ICD-10-CM | POA: Diagnosis not present

## 2018-01-03 DIAGNOSIS — H43392 Other vitreous opacities, left eye: Secondary | ICD-10-CM | POA: Diagnosis not present

## 2018-01-03 DIAGNOSIS — H2513 Age-related nuclear cataract, bilateral: Secondary | ICD-10-CM | POA: Diagnosis not present

## 2018-01-03 DIAGNOSIS — H469 Unspecified optic neuritis: Secondary | ICD-10-CM | POA: Diagnosis not present

## 2018-01-03 DIAGNOSIS — H35342 Macular cyst, hole, or pseudohole, left eye: Secondary | ICD-10-CM | POA: Diagnosis not present

## 2018-01-03 DIAGNOSIS — H43813 Vitreous degeneration, bilateral: Secondary | ICD-10-CM | POA: Diagnosis not present

## 2018-01-04 ENCOUNTER — Ambulatory Visit: Payer: Medicare Other | Admitting: Physician Assistant

## 2018-01-05 DIAGNOSIS — M810 Age-related osteoporosis without current pathological fracture: Secondary | ICD-10-CM | POA: Diagnosis not present

## 2018-01-06 DIAGNOSIS — D2272 Melanocytic nevi of left lower limb, including hip: Secondary | ICD-10-CM | POA: Diagnosis not present

## 2018-01-06 DIAGNOSIS — D1721 Benign lipomatous neoplasm of skin and subcutaneous tissue of right arm: Secondary | ICD-10-CM | POA: Diagnosis not present

## 2018-01-06 DIAGNOSIS — D1801 Hemangioma of skin and subcutaneous tissue: Secondary | ICD-10-CM | POA: Diagnosis not present

## 2018-01-06 DIAGNOSIS — L821 Other seborrheic keratosis: Secondary | ICD-10-CM | POA: Diagnosis not present

## 2018-01-18 ENCOUNTER — Telehealth: Payer: Self-pay | Admitting: Family Medicine

## 2018-01-18 ENCOUNTER — Encounter: Payer: Self-pay | Admitting: Family Medicine

## 2018-01-18 ENCOUNTER — Ambulatory Visit
Admission: RE | Admit: 2018-01-18 | Discharge: 2018-01-18 | Disposition: A | Discharge status: Home / Self Care | Payer: Medicare Other | Source: Ambulatory Visit | Attending: Family Medicine | Admitting: Family Medicine

## 2018-01-18 ENCOUNTER — Ambulatory Visit (INDEPENDENT_AMBULATORY_CARE_PROVIDER_SITE_OTHER): Payer: Medicare Other | Admitting: Family Medicine

## 2018-01-18 VITALS — BP 130/82 | Temp 98.9°F | Ht 63.0 in | Wt 135.8 lb

## 2018-01-18 DIAGNOSIS — Z1231 Encounter for screening mammogram for malignant neoplasm of breast: Secondary | ICD-10-CM | POA: Diagnosis not present

## 2018-01-18 DIAGNOSIS — Z79899 Other long term (current) drug therapy: Secondary | ICD-10-CM

## 2018-01-18 DIAGNOSIS — D649 Anemia, unspecified: Secondary | ICD-10-CM

## 2018-01-18 DIAGNOSIS — Z0000 Encounter for general adult medical examination without abnormal findings: Secondary | ICD-10-CM

## 2018-01-18 DIAGNOSIS — Z23 Encounter for immunization: Secondary | ICD-10-CM

## 2018-01-18 DIAGNOSIS — I878 Other specified disorders of veins: Secondary | ICD-10-CM | POA: Diagnosis not present

## 2018-01-18 DIAGNOSIS — E785 Hyperlipidemia, unspecified: Secondary | ICD-10-CM

## 2018-01-18 NOTE — Telephone Encounter (Signed)
patient has physical in Oct. Needing labs done

## 2018-01-18 NOTE — Telephone Encounter (Signed)
Last labs 8/19: Met 7

## 2018-01-18 NOTE — Progress Notes (Signed)
   Subjective:    Patient ID: [name] [name], female    DOB: 08-12-1940, 77 y.o.   MRN: [id number] Patient arrives for very protracted discussion regarding both ankles. HPI Pt here today due to bilateral feet and ankle swelling. Has been going on for several weeks. Pt does prop feet up at night and when sitting in recliner.   For the last ten days has had more swelling  Left more than right    Has had swelloing tendency for a long time  Pt a bit worrie d about  swellinng  No change in the diet   Pt has not noticed worsening in the summewr  On further history has been treated for venous stasis dermatitis in the past  Had fairly broad-spectrum blood work back last winter.  Liver renal function and thyroid function all excellent.  Review of Systems No headache, no major weight loss or weight gain, no chest pain no back pain abdominal pain no change in bowel habits complete ROS otherwise negative     Objective:   Physical Exam   Alert and oriented, vitals reviewed and stable, NAD ENT-TM's and ext canals WNL bilat via otoscopic exam Soft palate, tonsils and post pharynx WNL via oropharyngeal exam Neck-symmetric, no masses; thyroid nonpalpable and nontender Pulmonary-no tachypnea or accessory muscle use; Clear without wheezes via auscultation Card--no abnrml murmurs, rhythm reg and rate WNL Carotid pulses symmetric, without bruits Ankles trace to 1+ edema bilateral pulses excellent sensation intact no deformity     Assessment & Plan:  Impression mild venous stasis.  Considerable concern on the part of the patient regarding potential etiology.  Very long discussion held regarding why this is not emanating from kidney disease or heart failure liver failure or mechanical obstruction.  No supporting evidence for DVT work-up.  Patient was worried about lymphedema not enough stasis present to support that diagnosis.  Numerous questions answered.  Recommend no therapy other than  perhaps compression stocking patient desires to hold off on that for now  Greater than 50% of this 25 minute face to face visit was spent in counseling and discussion and coordination of care regarding the above diagnosis/diagnosies

## 2018-01-19 NOTE — Telephone Encounter (Signed)
Orders put in and left message to return call to notify her

## 2018-01-19 NOTE — Telephone Encounter (Signed)
Lip liv m7 cbc 

## 2018-01-19 NOTE — Addendum Note (Signed)
Addended by: Carmelina Noun on: 01/19/2018 01:53 PM   Modules accepted: Orders

## 2018-01-19 NOTE — Telephone Encounter (Signed)
Patient is aware 

## 2018-01-26 ENCOUNTER — Ambulatory Visit: Payer: Medicare Other | Admitting: Cardiology

## 2018-01-30 NOTE — Progress Notes (Signed)
Cardiology Office Note    Date:  01/31/2018   ID:  [name] [name], DOB 17-Aug-1940, MRN [id number]  PCP:  Mikey Kirschner, MD  Cardiologist: Ena Dawley, MD EPS: None  Chief Complaint  Patient presents with  . Follow-up    History of Present Illness:  [name] [name] is a 77 y.o. female with history of MVP, bradycardia tachycardia syndrome, palpitations, HLD, hypertension, normal coronary arteries and LV function on cath in 10/16/13.  Holter monitor in the past showed 2 episodes of NSVT lasting 14 and 11 cycles.  Cardiac MRI showed normal biventricular function and ruled out infiltrative or inflammatory cardiomyopathy. Repeat echo in March 2017 showed normal biventricular size thickness and function. She was evaluated by EPS and recommended she take beta blocker and watchful waiting. Flecainide would be another medical option in conjunction with her beta blocker she has bothersome symptoms.   Patient last saw Dr. Meda Coffee 12/2016 which time she was doing well.  Complaining of some claudication-like symptoms but ABIs were normal.  Patient comes in for yearly f/u. Complains of swelling in legs for the past month.  Worse after riding in a car for long period of time or standing.  No significant shortness of breath.  Saw Dr. Wolfgang Phoenix who told her it was venous stasis. Also has experienced occasional dizziness with her palpitations. It passes very quickly.Doesn't happen very often.  Says if it lasted any longer she feels like she passed out.    Past Medical History:  Diagnosis Date  . Anal sphincter incompetence 12/31/2015   Manometry 12/2015  . Arthritis   . Brady-tachy syndrome ([place])   . Chest pain 10/16/13   normal coronary arteries on cath and normal EF  . Colitis, ischemic ([place])   . Colon polyp 07/29/1993   with focal adenomatous changes  . Diverticulitis   . Diverticulosis of colon   . Dyspepsia   . Dyssynergic defecation 12/31/2015   Anorectal mano 12/2015, also has rectal  hypersensitivity  . Fatty liver   . GERD (gastroesophageal reflux disease)   . Heart murmur   . HLD (hyperlipidemia)   . Hypertension   . IBS (irritable bowel syndrome)   . Incontinence, feces   . MVP (mitral valve prolapse)   . nhl dx'd 07/1999   chemo comp 2001; rituxin comp 2005  . Osteopenia   . Palpitation   . Rectocele   . Rectocele   . Vasovagal syncope     Past Surgical History:  Procedure Laterality Date  . ABDOMINAL HYSTERECTOMY    . ABDOMINAL SURGERY  2001    for non hodgkins lymphoma; lymphoma in mesentary  . ANAL RECTAL MANOMETRY N/A 12/24/2015   Procedure: ANO RECTAL MANOMETRY;  Surgeon: Gatha Mayer, MD;  Location: [facility];  Service: Endoscopy;  Laterality: N/A;  . BLADDER SUSPENSION    . BUNIONECTOMY    . CHOLECYSTECTOMY  2003  . COLONOSCOPY  7/200/, 11/2007   diverticulosis  . LEFT HEART CATHETERIZATION WITH CORONARY ANGIOGRAM N/A 10/16/2013   Procedure: LEFT HEART CATHETERIZATION WITH CORONARY ANGIOGRAM;  Surgeon: Peter M Martinique, MD;  Location: [facility] CATH LAB;  Service: Cardiovascular;  Laterality: N/A;  . pelvic prolapse repair    . UPPER GASTROINTESTINAL ENDOSCOPY  10/2005   hiatal hernia    Current Medications: Current Meds  Medication Sig  . ALPRAZolam (XANAX) 0.25 MG tablet Take 1 tablet (0.25 mg total) by mouth daily as needed for anxiety.  Marland Kitchen aspirin EC 81 MG tablet Take 81  mg by mouth daily.  Marland Kitchen atenolol (TENORMIN) 25 MG tablet TAKE ONE (1) TABLET BY MOUTH EVERY DAY  . clobetasol ointment (TEMOVATE) 4.00 % Apply 1 application topically daily as needed (dermatitis).   . fexofenadine (ALLEGRA) 180 MG tablet Take 180 mg by mouth daily. Reported on 8/67/6195  . folic acid (FOLVITE) 1 MG tablet Take 1 mg by mouth.  . NON FORMULARY Fiber one- one heaping tablespoon nightly  . pantoprazole (PROTONIX) 40 MG tablet TAKE ONE (1) TABLET BY MOUTH EVERY DAY BEFORE BREAKFAST  . Polyvinyl Alcohol-Povidone (REFRESH OP) Apply 1 drop to eye 3 (three) times daily.    . Probiotic Product (ALIGN PO) Take by mouth as directed. Alternates between [place] per PCP orders every few months  . Probiotic Product ([place]) CAPS Take 1 capsule by mouth daily.  . [DISCONTINUED] atenolol (TENORMIN) 25 MG tablet TAKE ONE (1) TABLET BY MOUTH EVERY DAY     Allergies:   Glycopyrrolate; Methscopolamine; Other; Sulfa antibiotics; Sulfonamide derivatives; Latex; Ace inhibitors; Codeine; Iodine; and Iohexol   Social History   Socioeconomic History  . Marital status: Widowed    Spouse name: Not on file  . Number of children: 1  . Years of education: Not on file  . Highest education level: Not on file  Occupational History  . 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 Financial resource strain: Not on file  . Food insecurity:    Worry: Not on file    Inability: Not on file  . Transportation needs:    Medical: Not on file    Non-medical: Not on file  Tobacco Use  . Smoking status: Former Smoker    Packs/day: 0.33  . Smokeless tobacco: Never Used  . Tobacco comment: quit 1976  Substance and Sexual Activity  . Alcohol use: Yes    Alcohol/week: 0.0 standard drinks    Comment: very rare  . Drug use: No  . Sexual activity: Yes    Birth control/protection: Surgical  Lifestyle  . Physical activity:    Days per week: Not on file    Minutes per session: Not on file  . Stress: Not on file  Relationships  . Social connections:    Talks on phone: Not on file    Gets together: Not on file    Attends religious service: Not on file    Active member of club or organization: Not on file    Attends meetings of clubs or organizations: Not on file    Relationship status: Not on file  Other Topics Concern  . Not on file  Social History Narrative   Retired, widowed, 1 child   Rare EtOH, former smoker none now   No drugs     Family History:  The patient's family history includes Colon cancer (age of  onset: 33) in her father; Colon polyps in her brother and brother; Prostate cancer in her brother; Stomach cancer in her maternal grandfather and maternal uncle.   ROS:   Please see the history of present illness.    Review of Systems  Constitution: Negative.  HENT: Negative.   Eyes: Negative.   Cardiovascular: Positive for leg swelling and palpitations.  Respiratory: Negative.   Hematologic/Lymphatic: Negative.   Musculoskeletal: Negative.  Negative for joint pain.  Gastrointestinal: Negative.   Genitourinary: Negative.   Neurological: Positive for dizziness.   All other systems reviewed and are negative.   PHYSICAL EXAM:  VS:  BP 126/74   Pulse 73   Ht 5\' 3"  (1.6 m)   Wt 135 lb 9.6 oz (61.5 kg)   SpO2 99%   BMI 24.02 kg/m   Physical Exam  GEN: Thin, young looking, in no acute distress  Neck: no JVD, carotid bruits, or masses Cardiac:RRR; no murmurs, rubs, or gallops  Respiratory:  clear to auscultation bilaterally, normal work of breathing GI: soft, nontender, nondistended, + BS Ext: without cyanosis, clubbing, or edema, Good distal pulses bilaterally Neuro:  Alert and Oriented x 3 Psych: euthymic mood, full affect  Wt Readings from Last 3 Encounters:  01/31/18 135 lb 9.6 oz (61.5 kg)  01/18/18 135 lb 12.8 oz (61.6 kg)  11/24/17 135 lb (61.2 kg)      Studies/Labs Reviewed:   EKG:  EKG is  ordered today.  The ekg ordered today demonstrates normal sinus rhythm, nonspecific ST-T wave changes, no acute change  Recent Labs: 09/08/2017: ALT 39; BUN 15; Creatinine, Ser 0.79; Hemoglobin 13.2; Platelets 274; Potassium 5.0; Sodium 142   Lipid Panel    Component Value Date/Time   CHOL 200 (H) 01/12/2017 0835   TRIG 78 01/12/2017 0835   HDL 52 01/12/2017 0835   CHOLHDL 3.8 01/12/2017 0835   CHOLHDL 4.6 02/06/2013 0806   VLDL 33 02/06/2013 0806   LDLCALC 132 (H) 01/12/2017 0835    Additional studies/ records that were reviewed today include:   2-D echo 07/16/15   -  Left ventricle: The cavity size was normal. Wall thickness was   normal. Systolic function was normal. The estimated ejection   fraction was in the range of 55% to 60%. Wall motion was normal;   there were no regional wall motion abnormalities. Doppler   parameters are consistent with abnormal left ventricular   relaxation (grade 1 diastolic dysfunction). - Aortic valve: There was no stenosis. - Mitral valve: There was trivial regurgitation. - Right ventricle: The cavity size was normal. Systolic function   was normal. - Tricuspid valve: Peak RV-RA gradient (S): 20 mm Hg. - Pulmonary arteries: PA peak pressure: 23 mm Hg (S). - Inferior vena cava: The vessel was normal in size. The   respirophasic diameter changes were in the normal range (= 50%),   consistent with normal central venous pressure.   Impressions: - Normal LV size with EF 55-60%. Normal RV size and systolic   function. No significant valvular abnormalities.   Cardiac MRI: 12/2013 IMPRESSION: 1. Normal left ventricular size, thickness and systolic function (LVEF = 56%). No late gadolinium enhancement seen in the left ventricular myocardium.   2. Normal right ventricular size, thickness and systolic function (RVEF = 56%). No regional wall motion abnormalities. No abnormal fat or LGE seen in the right ventricular myocardium. Conclusively, there is no evidence for arrhythmogenic right ventricular dysplasia.   3. Prolapse of the anterior mitral valve leaflet associated with mild mitral regurgitation.   4. Mild tricuspid regurgitation.         ASSESSMENT:    1. NSVT (nonsustained ventricular tachycardia) (HCC)   2. Palpitations   3. Essential hypertension   4. Lower extremity edema      PLAN:  In order of problems listed above:  NSVT stable on atenolol  Palpitations much better on atenolol, did not tolerate metoprolol.  But now having some symptoms of dizziness with her palpitations for the past 6 months.   Does not occur often.  Will place a 30-day monitor to rule out any further arrhythmia or  NSVT  Essential hypertension blood pressure well controlled  Lower extremity edema for the past month suspect secondary to venous stasis but she did have grade 1 DD on echo in 2017.  Will check 2D echo for LV function and diastolic function.  2 g sodium diet.  Compression stockings.  Hold off on diuretic at this time.  Recent labs in August showed normal renal function and potassium.  Medication Adjustments/Labs and Tests Ordered: Current medicines are reviewed at length with the patient today.  Concerns regarding medicines are outlined above.  Medication changes, Labs and Tests ordered today are listed in the Patient Instructions below. Patient Instructions   Medication Instructions:  Your physician recommends that you continue on your current medications as directed. Please refer to the Current Medication list given to you today.   Labwork: None ordered  Testing/Procedures: Your physician has recommended that you wear an event monitor. Event monitors are medical devices that record the heart's electrical activity. Doctors most often [place] these monitors to diagnose arrhythmias. Arrhythmias are problems with the speed or rhythm of the heartbeat. The monitor is a small, portable device. You can wear one while you do your normal daily activities. This is usually used to diagnose what is causing palpitations/syncope (passing out).  Your physician has requested that you have an echocardiogram. Echocardiography is a painless test that uses sound waves to create images of your heart. It provides your doctor with information about the size and shape of your heart and how well your heart's chambers and valves are working. This procedure takes approximately one hour. There are no restrictions for this procedure.    Follow-Up: Your physician recommends that you schedule a follow-up appointment in: 2 months with  Ermalinda Barrios, PA or Dr. Meda Coffee    Any Other Special Instructions Will Be Listed Below (If Applicable).  WEAR COMPRESSION HOSE   Cardiac Event Monitoring A cardiac event monitor is a small recording device that is used to detect abnormal heart rhythms (arrhythmias). The monitor is used to record your heart rhythm when you have symptoms, such as:  Fast heartbeats (palpitations), such as heart racing or fluttering.  Dizziness.  Fainting or light-headedness.  Unexplained weakness.  Some monitors are wired to electrodes placed on your chest. Electrodes are flat, sticky disks that attach to your skin. Other monitors may be hand-held or worn on the wrist. The monitor can be worn for up to 30 days. If the monitor is attached to your chest, a technician will prepare your chest for the electrode placement and show you how to work the monitor. Take time to practice using the monitor before you leave the office. Make sure you understand how to send the information from the monitor to your health care provider. In some cases, you may need to use a landline telephone instead of a cell phone. What are the risks? Generally, this device is safe to use, but it possible that the skin under the electrodes will become irritated. How to use your cardiac event monitor  Wear your monitor at all times, except when you are in water: ? Do not let the monitor get wet. ? Take the monitor off when you bathe. Do not swim or use a hot tub with it on.  Keep your skin clean. Do not put body lotion or moisturizer on your chest.  Change the electrodes as told by your health care provider or any time they stop sticking to your skin. You may need to use medical tape  to keep them on.  Try to put the electrodes in slightly different places on your chest to help prevent skin irritation. They must remain in the area under your left breast and in the upper right section of your chest.  Make sure the monitor is safely  clipped to your clothing or in a location close to your body that your health care provider recommends.  Press the button to record as soon as you feel heart-related symptoms, such as: ? Dizziness. ? Weakness. ? Light-headedness. ? Palpitations. ? Thumping or pounding in your chest. ? Shortness of breath. ? Unexplained weakness.  Keep a diary of your activities, such as walking, doing chores, and taking medicine. It is very important to note what you were doing when you pushed the button to record your symptoms. This will help your health care provider determine what might be contributing to your symptoms.  Send the recorded information as recommended by your health care provider. It may take some time for your health care provider to process the results.  Change the batteries as told by your health care provider.  Keep electronic devices away from your monitor. This includes: ? Tablets. ? MP3 players. ? Cell phones.  While wearing your monitor you should avoid: ? Electric blankets. ? Armed forces operational officer. ? Electric toothbrushes. ? Microwave ovens. ? Magnets. ? Metal detectors. Get help right away if:  You have chest pain.  You have extreme difficulty breathing or shortness of breath.  You develop a very fast heartbeat that persists.  You develop dizziness that does not go away.  You faint or constantly feel like you are about to faint. Summary  A cardiac event monitor is a small recording device that is used to help detect abnormal heart rhythms (arrhythmias).  The monitor is used to record your heart rhythm when you have heart-related symptoms.  Make sure you understand how to send the information from the monitor to your health care provider.  It is important to press the button on the monitor when you have any heart-related symptoms.  Keep a diary of your activities, such as walking, doing chores, and taking medicine. It is very important to note what you were doing  when you pushed the button to record your symptoms. This will help your health care provider learn what might be causing your symptoms. This information is not intended to replace advice given to you by your health care provider. Make sure you discuss any questions you have with your health care provider. Document Released: 01/27/2008 Document Revised: 04/03/2016 Document Reviewed: 04/03/2016 Elsevier Interactive Patient Education  2017 [place].  Echocardiogram An echocardiogram, or echocardiography, uses sound waves (ultrasound) to produce an image of your heart. The echocardiogram is simple, painless, obtained within a short period of time, and offers valuable information to your health care provider. The images from an echocardiogram can provide information such as:  Evidence of coronary artery disease (CAD).  Heart size.  Heart muscle function.  Heart valve function.  Aneurysm detection.  Evidence of a past heart attack.  Fluid buildup around the heart.  Heart muscle thickening.  Assess heart valve function.  Tell a health care provider about:  Any allergies you have.  All medicines you are taking, including vitamins, herbs, eye drops, creams, and over-the-counter medicines.  Any problems you or family members have had with anesthetic medicines.  Any blood disorders you have.  Any surgeries you have had.  Any medical conditions you have.  Whether you  are pregnant or may be pregnant. What happens before the procedure? No special preparation is needed. Eat and drink normally. What happens during the procedure?  In order to produce an image of your heart, gel will be applied to your chest and a wand-like tool (transducer) will be moved over your chest. The gel will help transmit the sound waves from the transducer. The sound waves will harmlessly bounce off your heart to allow the heart images to be captured in real-time motion. These images will then be  recorded.  You may need an IV to receive a medicine that improves the quality of the pictures. What happens after the procedure? You may return to your normal schedule including diet, activities, and medicines, unless your health care provider tells you otherwise. This information is not intended to replace advice given to you by your health care provider. Make sure you discuss any questions you have with your health care provider. Document Released: 04/16/2000 Document Revised: 12/06/2015 Document Reviewed: 12/25/2012 Elsevier Interactive Patient Education  2017 [place].   Two Gram Sodium Diet 2000 mg  What is Sodium? Sodium is a mineral found naturally in many foods. The most significant source of sodium in the diet is table salt, which is about 40% sodium.  Processed, convenience, and preserved foods also contain a large amount of sodium.  The body needs only 500 mg of sodium daily to function,  A normal diet provides more than enough sodium even if you do not use salt.  Why Limit Sodium? A build up of sodium in the body can cause thirst, increased blood pressure, shortness of breath, and water retention.  Decreasing sodium in the diet can reduce edema and risk of heart attack or stroke associated with high blood pressure.  Keep in mind that there are many other factors involved in these health problems.  Heredity, obesity, lack of exercise, cigarette smoking, stress and what you eat all play a role.  General Guidelines:  Do not add salt at the table or in cooking.  One teaspoon of salt contains over 2 grams of sodium.  Read food labels  Avoid processed and convenience foods  Ask your dietitian before eating any foods not dicussed in the menu planning guidelines  Consult your physician if you wish to use a salt substitute or a sodium containing medication such as antacids.  Limit milk and milk products to 16 oz (2 cups) per day.  Shopping Hints:  READ LABELS!! "Dietetic" does  not necessarily mean low sodium.  Salt and other sodium ingredients are often added to foods during processing.   Menu Planning Guidelines Food Group Choose More Often Avoid  Beverages (see also the milk group All fruit juices, low-sodium, salt-free vegetables juices, low-sodium carbonated beverages Regular vegetable or tomato juices, commercially softened water used for drinking or cooking  Breads and Cereals Enriched white, wheat, rye and pumpernickel bread, hard rolls and dinner rolls; muffins, cornbread and waffles; most dry cereals, cooked cereal without added salt; unsalted crackers and breadsticks; low sodium or homemade bread crumbs Bread, rolls and crackers with salted tops; quick breads; instant hot cereals; pancakes; commercial bread stuffing; self-rising flower and biscuit mixes; regular bread crumbs or cracker crumbs  Desserts and Sweets Desserts and sweets mad with mild should be within allowance Instant pudding mixes and cake mixes  Fats Butter or margarine; vegetable oils; unsalted salad dressings, regular salad dressings limited to 1 Tbs; light, sour and heavy cream Regular salad dressings containing bacon fat, bacon  bits, and salt pork; snack dips made with instant soup mixes or processed cheese; salted nuts  Fruits Most fresh, frozen and canned fruits Fruits processed with salt or sodium-containing ingredient (some dried fruits are processed with sodium sulfites        Vegetables Fresh, frozen vegetables and low- sodium canned vegetables Regular canned vegetables, sauerkraut, pickled vegetables, and others prepared in brine; frozen vegetables in sauces; vegetables seasoned with ham, bacon or salt pork  Condiments, Sauces, Miscellaneous  Salt substitute with physician's approval; pepper, herbs, spices; vinegar, lemon or lime juice; hot pepper sauce; garlic powder, onion powder, low sodium soy sauce (1 Tbs.); low sodium condiments (ketchup, chili sauce, mustard) in limited  amounts (1 tsp.) fresh ground horseradish; unsalted tortilla chips, pretzels, potato chips, popcorn, salsa (1/4 cup) Any seasoning made with salt including garlic salt, celery salt, onion salt, and seasoned salt; sea salt, rock salt, kosher salt; meat tenderizers; monosodium glutamate; mustard, regular soy sauce, barbecue, sauce, chili sauce, teriyaki sauce, steak sauce, Worcestershire sauce, and most flavored vinegars; canned gravy and mixes; regular condiments; salted snack foods, olives, picles, relish, horseradish sauce, catsup   Food preparation: Try these seasonings Meats:    Pork Sage, onion Serve with applesauce  Chicken Poultry seasoning, thyme, parsley Serve with cranberry sauce  Lamb Curry powder, rosemary, garlic, thyme Serve with mint sauce or jelly  Veal Marjoram, basil Serve with current jelly, cranberry sauce  Beef Pepper, bay leaf Serve with dry mustard, unsalted chive butter  Fish Bay leaf, dill Serve with unsalted lemon butter, unsalted parsley butter  Vegetables:    Asparagus Lemon juice   Broccoli Lemon juice   Carrots Mustard dressing parsley, mint, nutmeg, glazed with unsalted butter and sugar   Green beans Marjoram, lemon juice, nutmeg,dill seed   Tomatoes Basil, marjoram, onion   Spice /blend for [employer]" 4 tsp ground thyme 1 tsp ground sage 3 tsp ground rosemary 4 tsp ground marjoram   Test your knowledge 1. A product that says "Salt Free" may still contain sodium. True or False 2. Garlic Powder and Hot Pepper Sauce an be used as alternative seasonings.True or False 3. Processed foods have more sodium than fresh foods.  True or False 4. Canned Vegetables have less sodium than froze True or False  WAYS TO DECREASE YOUR SODIUM INTAKE 1. Avoid the use of added salt in cooking and at the table.  Table salt (and other prepared seasonings which contain salt) is probably one of the greatest sources of sodium in the diet.  Unsalted foods can gain flavor from the  sweet, sour, and butter taste sensations of herbs and spices.  Instead of using salt for seasoning, try the following seasonings with the foods listed.  Remember: how you use them to enhance natural food flavors is limited only by your creativity... Allspice-Meat, fish, eggs, fruit, peas, red and yellow vegetables Almond Extract-Fruit baked goods Anise Seed-Sweet breads, fruit, carrots, beets, cottage cheese, cookies (tastes like licorice) Basil-Meat, fish, eggs, vegetables, rice, vegetables salads, soups, sauces Bay Leaf-Meat, fish, stews, poultry Burnet-Salad, vegetables (cucumber-like flavor) Caraway Seed-Bread, cookies, cottage cheese, meat, vegetables, cheese, rice Cardamon-Baked goods, fruit, soups Celery Powder or seed-Salads, salad dressings, sauces, meatloaf, soup, bread.Do not use  celery salt Chervil-Meats, salads, fish, eggs, vegetables, cottage cheese (parsley-like flavor) Chili Power-Meatloaf, chicken cheese, corn, eggplant, egg dishes Chives-Salads cottage cheese, egg dishes, soups, vegetables, sauces Cilantro-Salsa, casseroles Cinnamon-Baked goods, fruit, pork, lamb, chicken, carrots Cloves-Fruit, baked goods, fish, pot roast, green beans, beets, carrots Coriander-Pastry,  cookies, meat, salads, cheese (lemon-orange flavor) Cumin-Meatloaf, fish,cheese, eggs, cabbage,fruit pie (caraway flavor) [employer], fruit, eggs, fish, poultry, cottage cheese, vegetables Dill Seed-Meat, cottage cheese, poultry, vegetables, fish, salads, bread Fennel Seed-Bread, cookies, apples, pork, eggs, fish, beets, cabbage, cheese, Licorice-like flavor Garlic-(buds or powder) Salads, meat, poultry, fish, bread, butter, vegetables, potatoes.Do not  use garlic salt Ginger-Fruit, vegetables, baked goods, meat, fish, poultry Horseradish Root-Meet, vegetables, butter Lemon Juice or Extract-Vegetables, fruit, tea, baked goods, fish salads Mace-Baked goods fruit, vegetables, fish, poultry (taste like  nutmeg) Maple Extract-Syrups Marjoram-Meat, chicken, fish, vegetables, breads, green salads (taste like Sage) Mint-Tea, lamb, sherbet, vegetables, desserts, carrots, cabbage Mustard, Dry or Seed-Cheese, eggs, meats, vegetables, poultry Nutmeg-Baked goods, fruit, chicken, eggs, vegetables, desserts Onion Powder-Meat, fish, poultry, vegetables, cheese, eggs, bread, rice salads (Do not use   Onion salt) Orange Extract-Desserts, baked goods Oregano-Pasta, eggs, cheese, onions, pork, lamb, fish, chicken, vegetables, green salads Paprika-Meat, fish, poultry, eggs, cheese, vegetables Parsley Flakes-Butter, vegetables, meat fish, poultry, eggs, bread, salads (certain forms may   Contain sodium Pepper-Meat fish, poultry, vegetables, eggs Peppermint Extract-Desserts, baked goods Poppy Seed-Eggs, bread, cheese, fruit dressings, baked goods, noodles, vegetables, cottage  [employer], poultry, meat, fish, cauliflower, turnips,eggs bread Saffron-Rice, bread, veal, chicken, fish, eggs Sage-Meat, fish, poultry, onions, eggplant, tomateos, pork, stews Savory-Eggs, salads, poultry, meat, rice, vegetables, soups, pork Tarragon-Meat, poultry, fish, eggs, butter, vegetables (licorice-like flavor)  Thyme-Meat, poultry, fish, eggs, vegetables, (clover-like flavor), sauces, soups Tumeric-Salads, butter, eggs, fish, rice, vegetables (saffron-like flavor) Vanilla Extract-Baked goods, candy Vinegar-Salads, vegetables, meat marinades Walnut Extract-baked goods, candy  2. Choose your Foods Wisely   The following is a list of foods to avoid which are high in sodium:  Meats-Avoid all smoked, canned, salt cured, dried and kosher meat and fish as well as Anchovies   [employer] meats:Bologna, Liverwurst, Pastrami Canned meat or fish  Marinated herring Caviar    Pepperoni Corned Beef   Pizza Dried chipped beef  Salami Frozen breaded fish or meat Salt  pork Frankfurters or hot dogs  Sardines Gefilte fish   Sausage Ham (boiled ham, Proscuitto Smoked butt    spiced ham)   Spam      TV Dinners Vegetables Canned vegetables (Regular) Relish Canned mushrooms  Sauerkraut Olives    Tomato juice Pickles  Bakery and Dessert Products Canned puddings  Cream pies Cheesecake   Decorated cakes Cookies  Beverages/Juices Tomato juice, regular  Gatorade   V-8 vegetable juice, regular  Breads and Cereals Biscuit mixes   Salted potato chips, corn chips, pretzels Bread stuffing mixes  Salted crackers and rolls Pancake and waffle mixes Self-rising flour  Seasonings Accent    Meat sauces Barbecue sauce  Meat tenderizer Catsup    Monosodium glutamate (MSG) Celery salt   Onion salt Chili sauce   Prepared mustard Garlic salt   Salt, seasoned salt, sea salt Gravy mixes   Soy sauce Horseradish   Steak sauce Ketchup   Tartar sauce Lite salt    Teriyaki sauce Marinade mixes   Worcestershire sauce  Others Baking powder   Cocoa and cocoa mixes Baking soda   Commercial casserole mixes Candy-caramels, chocolate  Dehydrated soups    Bars, fudge,nougats  Instant rice and pasta mixes Canned broth or soup  Maraschino cherries Cheese, aged and processed cheese and cheese spreads  Learning Assessment Quiz  Indicated T (for True) or F (for False) for each of the following statements:  1. _____ Fresh fruits and vegetables and unprocessed  grains are generally low in sodium 2. _____ Water may contain a considerable amount of sodium, depending on the source 3. _____ You can always tell if a food is high in sodium by tasting it 4. _____ Certain laxatives my be high in sodium and should be avoided unless prescribed   by a physician or pharmacist 5. _____ Salt substitutes may be used freely by anyone on a sodium restricted diet 6. _____ Sodium is present in table salt, food additives and as a natural component of   most foods 7. _____ Table salt is  approximately 90% sodium 8. _____ Limiting sodium intake may help prevent excess fluid accumulation in the body 9. _____ On a sodium-restricted diet, seasonings such as bouillon soy sauce, and    cooking wine should be used in place of table salt 10. _____ On an ingredient list, a product which lists monosodium glutamate as the first   ingredient is an appropriate food to include on a low sodium diet  Circle the best answer(s) to the following statements (Hint: there may be more than one correct answer)  11. On a low-sodium diet, some acceptable snack items are:    A. Olives  F. Bean dip   K. Grapefruit juice    B. Salted Pretzels G. Commercial Popcorn   L. Canned peaches    C. Carrot Sticks  H. Bouillon   M. Unsalted nuts   D. [place] fries  I. Peanut butter crackers N. Salami   E. Sweet pickles J. Tomato Juice   O. Pizza  12.  Seasonings that may be used freely on a reduced - sodium diet include   A. Lemon wedges F.Monosodium glutamate K. Celery seed    B.Soysauce   G. Pepper   L. Mustard powder   C. Sea salt  H. Cooking wine  M. Onion flakes   D. Vinegar  E. Prepared horseradish N. Salsa   E. Sage   J. Worcestershire sauce  O. Chutney   If you need a refill on your cardiac medications before your next appointment, please call your pharmacy.      Signed, Ermalinda Barrios, PA-C  01/31/2018 9:25 AM    [facility] [place], [place], [place]  [address] Phone: [phone]; Fax: [phone]

## 2018-01-31 ENCOUNTER — Ambulatory Visit: Payer: Medicare Other

## 2018-01-31 ENCOUNTER — Ambulatory Visit (INDEPENDENT_AMBULATORY_CARE_PROVIDER_SITE_OTHER): Payer: Medicare Other | Admitting: Physician Assistant

## 2018-01-31 ENCOUNTER — Encounter: Payer: Self-pay | Admitting: Physician Assistant

## 2018-01-31 ENCOUNTER — Ambulatory Visit (INDEPENDENT_AMBULATORY_CARE_PROVIDER_SITE_OTHER): Payer: Medicare Other

## 2018-01-31 VITALS — BP 126/74 | HR 73 | Ht 63.0 in | Wt 135.6 lb

## 2018-01-31 DIAGNOSIS — R002 Palpitations: Secondary | ICD-10-CM

## 2018-01-31 DIAGNOSIS — I10 Essential (primary) hypertension: Secondary | ICD-10-CM

## 2018-01-31 DIAGNOSIS — I4729 Other ventricular tachycardia: Secondary | ICD-10-CM

## 2018-01-31 DIAGNOSIS — R600 Localized edema: Secondary | ICD-10-CM | POA: Insufficient documentation

## 2018-01-31 DIAGNOSIS — I472 Ventricular tachycardia: Secondary | ICD-10-CM

## 2018-01-31 DIAGNOSIS — I471 Supraventricular tachycardia: Secondary | ICD-10-CM | POA: Diagnosis not present

## 2018-01-31 MED ORDER — ATENOLOL 25 MG PO TABS: ORAL_TABLET | ORAL | 3 refills | Status: DC

## 2018-01-31 NOTE — Patient Instructions (Signed)
Medication Instructions:  Your physician recommends that you continue on your current medications as directed. Please refer to the Current Medication list given to you today.   Labwork: None ordered  Testing/Procedures: Your physician has recommended that you wear an event monitor. Event monitors are medical devices that record the heart's electrical activity. Doctors most often [place] these monitors to diagnose arrhythmias. Arrhythmias are problems with the speed or rhythm of the heartbeat. The monitor is a small, portable device. You can wear one while you do your normal daily activities. This is usually used to diagnose what is causing palpitations/syncope (passing out).  Your physician has requested that you have an echocardiogram. Echocardiography is a painless test that uses sound waves to create images of your heart. It provides your doctor with information about the size and shape of your heart and how well your heart's chambers and valves are working. This procedure takes approximately one hour. There are no restrictions for this procedure.    Follow-Up: Your physician recommends that you schedule a follow-up appointment in: 2 months with Ermalinda Barrios, PA or Dr. Meda Coffee    Any Other Special Instructions Will Be Listed Below (If Applicable).  WEAR COMPRESSION HOSE   Cardiac Event Monitoring A cardiac event monitor is a small recording device that is used to detect abnormal heart rhythms (arrhythmias). The monitor is used to record your heart rhythm when you have symptoms, such as:  Fast heartbeats (palpitations), such as heart racing or fluttering.  Dizziness.  Fainting or light-headedness.  Unexplained weakness.  Some monitors are wired to electrodes placed on your chest. Electrodes are flat, sticky disks that attach to your skin. Other monitors may be hand-held or worn on the wrist. The monitor can be worn for up to 30 days. If the monitor is attached to your chest, a  technician will prepare your chest for the electrode placement and show you how to work the monitor. Take time to practice using the monitor before you leave the office. Make sure you understand how to send the information from the monitor to your health care provider. In some cases, you may need to use a landline telephone instead of a cell phone. What are the risks? Generally, this device is safe to use, but it possible that the skin under the electrodes will become irritated. How to use your cardiac event monitor  Wear your monitor at all times, except when you are in water: ? Do not let the monitor get wet. ? Take the monitor off when you bathe. Do not swim or use a hot tub with it on.  Keep your skin clean. Do not put body lotion or moisturizer on your chest.  Change the electrodes as told by your health care provider or any time they stop sticking to your skin. You may need to use medical tape to keep them on.  Try to put the electrodes in slightly different places on your chest to help prevent skin irritation. They must remain in the area under your left breast and in the upper right section of your chest.  Make sure the monitor is safely clipped to your clothing or in a location close to your body that your health care provider recommends.  Press the button to record as soon as you feel heart-related symptoms, such as: ? Dizziness. ? Weakness. ? Light-headedness. ? Palpitations. ? Thumping or pounding in your chest. ? Shortness of breath. ? Unexplained weakness.  Keep a diary of your activities, such as  walking, doing chores, and taking medicine. It is very important to note what you were doing when you pushed the button to record your symptoms. This will help your health care provider determine what might be contributing to your symptoms.  Send the recorded information as recommended by your health care provider. It may take some time for your health care provider to process the  results.  Change the batteries as told by your health care provider.  Keep electronic devices away from your monitor. This includes: ? Tablets. ? MP3 players. ? Cell phones.  While wearing your monitor you should avoid: ? Electric blankets. ? Armed forces operational officer. ? Electric toothbrushes. ? Microwave ovens. ? Magnets. ? Metal detectors. Get help right away if:  You have chest pain.  You have extreme difficulty breathing or shortness of breath.  You develop a very fast heartbeat that persists.  You develop dizziness that does not go away.  You faint or constantly feel like you are about to faint. Summary  A cardiac event monitor is a small recording device that is used to help detect abnormal heart rhythms (arrhythmias).  The monitor is used to record your heart rhythm when you have heart-related symptoms.  Make sure you understand how to send the information from the monitor to your health care provider.  It is important to press the button on the monitor when you have any heart-related symptoms.  Keep a diary of your activities, such as walking, doing chores, and taking medicine. It is very important to note what you were doing when you pushed the button to record your symptoms. This will help your health care provider learn what might be causing your symptoms. This information is not intended to replace advice given to you by your health care provider. Make sure you discuss any questions you have with your health care provider. Document Released: 01/27/2008 Document Revised: 04/03/2016 Document Reviewed: 04/03/2016 Elsevier Interactive Patient Education  2017 [place].  Echocardiogram An echocardiogram, or echocardiography, uses sound waves (ultrasound) to produce an image of your heart. The echocardiogram is simple, painless, obtained within a short period of time, and offers valuable information to your health care provider. The images from an echocardiogram can provide  information such as:  Evidence of coronary artery disease (CAD).  Heart size.  Heart muscle function.  Heart valve function.  Aneurysm detection.  Evidence of a past heart attack.  Fluid buildup around the heart.  Heart muscle thickening.  Assess heart valve function.  Tell a health care provider about:  Any allergies you have.  All medicines you are taking, including vitamins, herbs, eye drops, creams, and over-the-counter medicines.  Any problems you or family members have had with anesthetic medicines.  Any blood disorders you have.  Any surgeries you have had.  Any medical conditions you have.  Whether you are pregnant or may be pregnant. What happens before the procedure? No special preparation is needed. Eat and drink normally. What happens during the procedure?  In order to produce an image of your heart, gel will be applied to your chest and a wand-like tool (transducer) will be moved over your chest. The gel will help transmit the sound waves from the transducer. The sound waves will harmlessly bounce off your heart to allow the heart images to be captured in real-time motion. These images will then be recorded.  You may need an IV to receive a medicine that improves the quality of the pictures. What happens after the procedure?  You may return to your normal schedule including diet, activities, and medicines, unless your health care provider tells you otherwise. This information is not intended to replace advice given to you by your health care provider. Make sure you discuss any questions you have with your health care provider. Document Released: 04/16/2000 Document Revised: 12/06/2015 Document Reviewed: 12/25/2012 Elsevier Interactive Patient Education  2017 [place].   Two Gram Sodium Diet 2000 mg  What is Sodium? Sodium is a mineral found naturally in many foods. The most significant source of sodium in the diet is table salt, which is about 40%  sodium.  Processed, convenience, and preserved foods also contain a large amount of sodium.  The body needs only 500 mg of sodium daily to function,  A normal diet provides more than enough sodium even if you do not use salt.  Why Limit Sodium? A build up of sodium in the body can cause thirst, increased blood pressure, shortness of breath, and water retention.  Decreasing sodium in the diet can reduce edema and risk of heart attack or stroke associated with high blood pressure.  Keep in mind that there are many other factors involved in these health problems.  Heredity, obesity, lack of exercise, cigarette smoking, stress and what you eat all play a role.  General Guidelines:  Do not add salt at the table or in cooking.  One teaspoon of salt contains over 2 grams of sodium.  Read food labels  Avoid processed and convenience foods  Ask your dietitian before eating any foods not dicussed in the menu planning guidelines  Consult your physician if you wish to use a salt substitute or a sodium containing medication such as antacids.  Limit milk and milk products to 16 oz (2 cups) per day.  Shopping Hints:  READ LABELS!! "Dietetic" does not necessarily mean low sodium.  Salt and other sodium ingredients are often added to foods during processing.   Menu Planning Guidelines Food Group Choose More Often Avoid  Beverages (see also the milk group All fruit juices, low-sodium, salt-free vegetables juices, low-sodium carbonated beverages Regular vegetable or tomato juices, commercially softened water used for drinking or cooking  Breads and Cereals Enriched white, wheat, rye and pumpernickel bread, hard rolls and dinner rolls; muffins, cornbread and waffles; most dry cereals, cooked cereal without added salt; unsalted crackers and breadsticks; low sodium or homemade bread crumbs Bread, rolls and crackers with salted tops; quick breads; instant hot cereals; pancakes; commercial bread stuffing;  self-rising flower and biscuit mixes; regular bread crumbs or cracker crumbs  Desserts and Sweets Desserts and sweets mad with mild should be within allowance Instant pudding mixes and cake mixes  Fats Butter or margarine; vegetable oils; unsalted salad dressings, regular salad dressings limited to 1 Tbs; light, sour and heavy cream Regular salad dressings containing bacon fat, bacon bits, and salt pork; snack dips made with instant soup mixes or processed cheese; salted nuts  Fruits Most fresh, frozen and canned fruits Fruits processed with salt or sodium-containing ingredient (some dried fruits are processed with sodium sulfites        Vegetables Fresh, frozen vegetables and low- sodium canned vegetables Regular canned vegetables, sauerkraut, pickled vegetables, and others prepared in brine; frozen vegetables in sauces; vegetables seasoned with ham, bacon or salt pork  Condiments, Sauces, Miscellaneous  Salt substitute with physician's approval; pepper, herbs, spices; vinegar, lemon or lime juice; hot pepper sauce; garlic powder, onion powder, low sodium soy sauce (1 Tbs.); low sodium condiments (  ketchup, chili sauce, mustard) in limited amounts (1 tsp.) fresh ground horseradish; unsalted tortilla chips, pretzels, potato chips, popcorn, salsa (1/4 cup) Any seasoning made with salt including garlic salt, celery salt, onion salt, and seasoned salt; sea salt, rock salt, kosher salt; meat tenderizers; monosodium glutamate; mustard, regular soy sauce, barbecue, sauce, chili sauce, teriyaki sauce, steak sauce, Worcestershire sauce, and most flavored vinegars; canned gravy and mixes; regular condiments; salted snack foods, olives, picles, relish, horseradish sauce, catsup   Food preparation: Try these seasonings Meats:    Pork Sage, onion Serve with applesauce  Chicken Poultry seasoning, thyme, parsley Serve with cranberry sauce  Lamb Curry powder, rosemary, garlic, thyme Serve with mint sauce or jelly    Veal Marjoram, basil Serve with current jelly, cranberry sauce  Beef Pepper, bay leaf Serve with dry mustard, unsalted chive butter  Fish [place] leaf, dill Serve with unsalted lemon butter, unsalted parsley butter  Vegetables:    Asparagus Lemon juice   Broccoli Lemon juice   Carrots Mustard dressing parsley, mint, nutmeg, glazed with unsalted butter and sugar   Green beans Marjoram, lemon juice, nutmeg,dill seed   Tomatoes Basil, marjoram, onion   Spice /blend for [employer]" 4 tsp ground thyme 1 tsp ground sage 3 tsp ground rosemary 4 tsp ground marjoram   Test your knowledge 1. A product that says "Salt Free" may still contain sodium. True or False 2. Garlic Powder and Hot Pepper Sauce an be used as alternative seasonings.True or False 3. Processed foods have more sodium than fresh foods.  True or False 4. Canned Vegetables have less sodium than froze True or False  WAYS TO DECREASE YOUR SODIUM INTAKE 1. Avoid the use of added salt in cooking and at the table.  Table salt (and other prepared seasonings which contain salt) is probably one of the greatest sources of sodium in the diet.  Unsalted foods can gain flavor from the sweet, sour, and butter taste sensations of herbs and spices.  Instead of using salt for seasoning, try the following seasonings with the foods listed.  Remember: how you use them to enhance natural food flavors is limited only by your creativity... Allspice-Meat, fish, eggs, fruit, peas, red and yellow vegetables Almond Extract-Fruit baked goods Anise Seed-Sweet breads, fruit, carrots, beets, cottage cheese, cookies (tastes like licorice) Basil-Meat, fish, eggs, vegetables, rice, vegetables salads, soups, sauces Bay Leaf-Meat, fish, stews, poultry Burnet-Salad, vegetables (cucumber-like flavor) Caraway Seed-Bread, cookies, cottage cheese, meat, vegetables, cheese, rice Cardamon-Baked goods, fruit, soups Celery Powder or seed-Salads, salad dressings, sauces,  meatloaf, soup, bread.Do not use  celery salt Chervil-Meats, salads, fish, eggs, vegetables, cottage cheese (parsley-like flavor) Chili Power-Meatloaf, chicken cheese, corn, eggplant, egg dishes Chives-Salads cottage cheese, egg dishes, soups, vegetables, sauces Cilantro-Salsa, casseroles Cinnamon-Baked goods, fruit, pork, lamb, chicken, carrots Cloves-Fruit, baked goods, fish, pot roast, green beans, beets, carrots Coriander-Pastry, cookies, meat, salads, cheese (lemon-orange flavor) Cumin-Meatloaf, fish,cheese, eggs, cabbage,fruit pie (caraway flavor) [employer], fruit, eggs, fish, poultry, cottage cheese, vegetables Dill Seed-Meat, cottage cheese, poultry, vegetables, fish, salads, bread Fennel Seed-Bread, cookies, apples, pork, eggs, fish, beets, cabbage, cheese, Licorice-like flavor Garlic-(buds or powder) Salads, meat, poultry, fish, bread, butter, vegetables, potatoes.Do not  use garlic salt Ginger-Fruit, vegetables, baked goods, meat, fish, poultry Horseradish Root-Meet, vegetables, butter Lemon Juice or Extract-Vegetables, fruit, tea, baked goods, fish salads Mace-Baked goods fruit, vegetables, fish, poultry (taste like nutmeg) Maple Extract-Syrups Marjoram-Meat, chicken, fish, vegetables, breads, green salads (taste like Sage) Mint-Tea, lamb, sherbet, vegetables, desserts, carrots, cabbage Mustard, Dry or  Seed-Cheese, eggs, meats, vegetables, poultry Nutmeg-Baked goods, fruit, chicken, eggs, vegetables, desserts Onion Powder-Meat, fish, poultry, vegetables, cheese, eggs, bread, rice salads (Do not use   Onion salt) Orange Extract-Desserts, baked goods Oregano-Pasta, eggs, cheese, onions, pork, lamb, fish, chicken, vegetables, green salads Paprika-Meat, fish, poultry, eggs, cheese, vegetables Parsley Flakes-Butter, vegetables, meat fish, poultry, eggs, bread, salads (certain forms may   Contain sodium Pepper-Meat fish, poultry, vegetables, eggs Peppermint  Extract-Desserts, baked goods Poppy Seed-Eggs, bread, cheese, fruit dressings, baked goods, noodles, vegetables, cottage  [employer], poultry, meat, fish, cauliflower, turnips,eggs bread Saffron-Rice, bread, veal, chicken, fish, eggs Sage-Meat, fish, poultry, onions, eggplant, tomateos, pork, stews Savory-Eggs, salads, poultry, meat, rice, vegetables, soups, pork Tarragon-Meat, poultry, fish, eggs, butter, vegetables (licorice-like flavor)  Thyme-Meat, poultry, fish, eggs, vegetables, (clover-like flavor), sauces, soups Tumeric-Salads, butter, eggs, fish, rice, vegetables (saffron-like flavor) Vanilla Extract-Baked goods, candy Vinegar-Salads, vegetables, meat marinades Walnut Extract-baked goods, candy  2. Choose your Foods Wisely   The following is a list of foods to avoid which are high in sodium:  Meats-Avoid all smoked, canned, salt cured, dried and kosher meat and fish as well as Anchovies   [employer] meats:Bologna, Liverwurst, Pastrami Canned meat or fish  Marinated herring Caviar    Pepperoni Corned Beef   Pizza Dried chipped beef  Salami Frozen breaded fish or meat Salt pork Frankfurters or hot dogs  Sardines Gefilte fish   Sausage Ham (boiled ham, Proscuitto Smoked butt    spiced ham)   Spam      TV Dinners Vegetables Canned vegetables (Regular) Relish Canned mushrooms  Sauerkraut Olives    Tomato juice Pickles  Bakery and Dessert Products Canned puddings  Cream pies Cheesecake   Decorated cakes Cookies  Beverages/Juices Tomato juice, regular  Gatorade   V-8 vegetable juice, regular  Breads and Cereals Biscuit mixes   Salted potato chips, corn chips, pretzels Bread stuffing mixes  Salted crackers and rolls Pancake and waffle mixes Self-rising flour  Seasonings Accent    Meat sauces Barbecue sauce  Meat tenderizer Catsup    Monosodium glutamate (MSG) Celery salt   Onion salt Chili sauce   Prepared  mustard Garlic salt   Salt, seasoned salt, sea salt Gravy mixes   Soy sauce Horseradish   Steak sauce Ketchup   Tartar sauce Lite salt    Teriyaki sauce Marinade mixes   Worcestershire sauce  Others Baking powder   Cocoa and cocoa mixes Baking soda   Commercial casserole mixes Candy-caramels, chocolate  Dehydrated soups    Bars, fudge,nougats  Instant rice and pasta mixes Canned broth or soup  Maraschino cherries Cheese, aged and processed cheese and cheese spreads  Learning Assessment Quiz  Indicated T (for True) or F (for False) for each of the following statements:  1. _____ Fresh fruits and vegetables and unprocessed grains are generally low in sodium 2. _____ Water may contain a considerable amount of sodium, depending on the source 3. _____ You can always tell if a food is high in sodium by tasting it 4. _____ Certain laxatives my be high in sodium and should be avoided unless prescribed   by a physician or pharmacist 5. _____ Salt substitutes may be used freely by anyone on a sodium restricted diet 6. _____ Sodium is present in table salt, food additives and as a natural component of   most foods 7. _____ Table salt is approximately 90% sodium 8. _____ Limiting sodium intake may help prevent excess fluid  accumulation in the body 9. _____ On a sodium-restricted diet, seasonings such as bouillon soy sauce, and    cooking wine should be used in place of table salt 10. _____ On an ingredient list, a product which lists monosodium glutamate as the first   ingredient is an appropriate food to include on a low sodium diet  Circle the best answer(s) to the following statements (Hint: there may be more than one correct answer)  11. On a low-sodium diet, some acceptable snack items are:    A. Olives  F. Bean dip   K. Grapefruit juice    B. Salted Pretzels G. Commercial Popcorn   L. Canned peaches    C. Carrot Sticks  H. Bouillon   M. Unsalted nuts   D. [place] fries  I. Peanut  butter crackers N. Salami   E. Sweet pickles J. Tomato Juice   O. Pizza  12.  Seasonings that may be used freely on a reduced - sodium diet include   A. Lemon wedges F.Monosodium glutamate K. Celery seed    B.Soysauce   G. Pepper   L. Mustard powder   C. Sea salt  H. Cooking wine  M. Onion flakes   D. Vinegar  E. Prepared horseradish N. Salsa   E. Sage   J. Worcestershire sauce  O. Chutney   If you need a refill on your cardiac medications before your next appointment, please call your pharmacy.

## 2018-02-10 ENCOUNTER — Other Ambulatory Visit: Payer: Self-pay

## 2018-02-10 ENCOUNTER — Ambulatory Visit (HOSPITAL_COMMUNITY): Payer: Medicare Other | Attending: Cardiology

## 2018-02-10 DIAGNOSIS — R600 Localized edema: Secondary | ICD-10-CM | POA: Diagnosis not present

## 2018-02-11 DIAGNOSIS — Z0000 Encounter for general adult medical examination without abnormal findings: Secondary | ICD-10-CM | POA: Diagnosis not present

## 2018-02-11 DIAGNOSIS — E785 Hyperlipidemia, unspecified: Secondary | ICD-10-CM | POA: Diagnosis not present

## 2018-02-11 DIAGNOSIS — D649 Anemia, unspecified: Secondary | ICD-10-CM | POA: Diagnosis not present

## 2018-02-11 DIAGNOSIS — Z79899 Other long term (current) drug therapy: Secondary | ICD-10-CM | POA: Diagnosis not present

## 2018-02-12 LAB — CBC WITH DIFFERENTIAL/PLATELET
BASOS: 1 %
Basophils Absolute: 0.1 10*3/uL (ref 0.0–0.2)
EOS (ABSOLUTE): 0.2 10*3/uL (ref 0.0–0.4)
EOS: 4 %
HEMATOCRIT: 43.6 % (ref 34.0–46.6)
Hemoglobin: 14.7 g/dL (ref 11.1–15.9)
IMMATURE GRANS (ABS): 0 10*3/uL (ref 0.0–0.1)
Immature Granulocytes: 0 %
LYMPHS ABS: 1.4 10*3/uL (ref 0.7–3.1)
LYMPHS: 25 %
MCH: 30.1 pg (ref 26.6–33.0)
MCHC: 33.7 g/dL (ref 31.5–35.7)
MCV: 89 fL (ref 79–97)
MONOCYTES: 15 %
Monocytes Absolute: 0.9 10*3/uL (ref 0.1–0.9)
Neutrophils Absolute: 3.2 10*3/uL (ref 1.4–7.0)
Neutrophils: 55 %
Platelets: 276 10*3/uL (ref 150–450)
RBC: 4.89 x10E6/uL (ref 3.77–5.28)
RDW: 13.3 % (ref 12.3–15.4)
WBC: 5.8 10*3/uL (ref 3.4–10.8)

## 2018-02-12 LAB — HEPATIC FUNCTION PANEL
ALBUMIN: 4.6 g/dL (ref 3.5–4.8)
ALT: 36 IU/L — AB (ref 0–32)
AST: 33 IU/L (ref 0–40)
Alkaline Phosphatase: 63 IU/L (ref 39–117)
BILIRUBIN TOTAL: 0.3 mg/dL (ref 0.0–1.2)
BILIRUBIN, DIRECT: 0.09 mg/dL (ref 0.00–0.40)
Total Protein: 6.3 g/dL (ref 6.0–8.5)

## 2018-02-12 LAB — BASIC METABOLIC PANEL
BUN / CREAT RATIO: 18 (ref 12–28)
BUN: 15 mg/dL (ref 8–27)
CALCIUM: 10.3 mg/dL (ref 8.7–10.3)
CHLORIDE: 106 mmol/L (ref 96–106)
CO2: 25 mmol/L (ref 20–29)
CREATININE: 0.82 mg/dL (ref 0.57–1.00)
GFR, EST AFRICAN AMERICAN: 80 mL/min/{1.73_m2} (ref >59)
GFR, EST NON AFRICAN AMERICAN: 69 mL/min/{1.73_m2} (ref >59)
Glucose: 102 mg/dL — ABNORMAL HIGH (ref 65–99)
Potassium: 4.7 mmol/L (ref 3.5–5.2)
Sodium: 144 mmol/L (ref 134–144)

## 2018-02-12 LAB — LIPID PANEL
CHOLESTEROL TOTAL: 215 mg/dL — AB (ref 100–199)
Chol/HDL Ratio: 4.1 ratio (ref 0.0–4.4)
HDL: 53 mg/dL (ref >39)
LDL CALC: 144 mg/dL — AB (ref 0–99)
TRIGLYCERIDES: 89 mg/dL (ref 0–149)
VLDL CHOLESTEROL CAL: 18 mg/dL (ref 5–40)

## 2018-02-14 ENCOUNTER — Telehealth: Payer: Self-pay

## 2018-02-14 NOTE — Telephone Encounter (Signed)
-----   Message from Imogene Burn, PA-C sent at 02/13/2018  3:23 PM EDT ----- 2D echo shows normal heart function with some trouble relaxing and mild murmur.  Similar to prior echo.  No changes.

## 2018-02-14 NOTE — Telephone Encounter (Signed)
Left message for patient to call back  

## 2018-02-14 NOTE — Telephone Encounter (Signed)
Patient made aware of results. Patient verbalizes understanding.  

## 2018-02-16 ENCOUNTER — Encounter: Payer: Medicare Other | Admitting: Family Medicine

## 2018-02-23 ENCOUNTER — Ambulatory Visit (INDEPENDENT_AMBULATORY_CARE_PROVIDER_SITE_OTHER): Payer: Medicare Other | Admitting: Family Medicine

## 2018-02-23 ENCOUNTER — Encounter: Payer: Self-pay | Admitting: Family Medicine

## 2018-02-23 VITALS — BP 124/78 | Ht 62.0 in | Wt 135.0 lb

## 2018-02-23 DIAGNOSIS — Z0000 Encounter for general adult medical examination without abnormal findings: Secondary | ICD-10-CM | POA: Diagnosis not present

## 2018-02-23 MED ORDER — ALPRAZOLAM 0.25 MG PO TABS: 0.2500 mg | ORAL_TABLET | Freq: Every day | ORAL | 2 refills | Status: DC | PRN

## 2018-02-23 NOTE — Addendum Note (Signed)
Addended by: Cheyenne Adas D on: 02/23/2018 02:07 PM   Modules accepted: Orders

## 2018-02-23 NOTE — Progress Notes (Addendum)
Subjective:    Patient ID: [name] [name], female    DOB: [birth year], 77 y.o.   MRN: [id number]  HPI AWV- Annual Wellness Visit  The patient was seen for their annual wellness visit. The patient's past medical history, surgical history, and family history were reviewed. Pertinent vaccines were reviewed ( tetanus, pneumonia, shingles, flu); not getting shingles vaccine d/t hx of NHL  The patient's medication list was reviewed and updated.  The height and weight were entered.  BMI recorded in electronic record elsewhere  Cognitive screening was completed. Outcome of Mini - Cog: Yes passed   Falls /depression screening electronically recorded within record elsewhere No  Current tobacco usage: No (All patients who use tobacco were given written and verbal information on quitting)  Recent listing of emergency department/hospitalizations over the past year were reviewed.  current specialist the patient sees on a regular basis: Dr.Gesner,Dr.Nelson<Dr.Gould,Dr james beeckman Regular vision and dental exams  Medicare annual wellness visit patient questionnaire was reviewed.  A written screening schedule for the patient for the next 5-10 years was given. Appropriate discussion of followup regarding next visit was discussed.  Taking xanax occasionally, maybe 1 per month to help with anxiety/sleep. Denies any adverse effects.   Review of Systems  Constitutional: Negative for chills, fatigue, fever and unexpected weight change.  HENT: Negative for congestion, ear pain, sinus pressure, sinus pain and sore throat.   Eyes: Negative for discharge and visual disturbance.  Respiratory: Negative for cough, shortness of breath and wheezing.   Cardiovascular: Negative for chest pain and leg swelling.  Gastrointestinal: Negative for abdominal pain, blood in stool, constipation, diarrhea, nausea and vomiting.  Genitourinary: Negative for difficulty urinating, hematuria, vaginal bleeding and  vaginal discharge.  Neurological: Negative for dizziness, weakness, light-headedness and headaches.  Psychiatric/Behavioral: Negative for suicidal ideas.  All other systems reviewed and are negative.      Objective:   Physical Exam  Constitutional: She is oriented to person, place, and time. She appears well-developed and well-nourished. No distress.  HENT:  Head: Normocephalic and atraumatic.  Right Ear: Tympanic membrane normal.  Left Ear: Tympanic membrane normal.  Nose: Nose normal.  Mouth/Throat: Uvula is midline and oropharynx is clear and moist.  Eyes: Pupils are equal, round, and reactive to light. Conjunctivae and EOM are normal. Right eye exhibits no discharge. Left eye exhibits no discharge.  Neck: Neck supple. No thyromegaly present.  Cardiovascular: Normal rate, regular rhythm and normal heart sounds.  No murmur heard. Pulmonary/Chest: Effort normal and breath sounds normal. No respiratory distress. She has no wheezes. Right breast exhibits no inverted nipple, no mass, no nipple discharge, no skin change and no tenderness. Left breast exhibits no inverted nipple, no mass, no nipple discharge, no skin change and no tenderness.  Abdominal: Soft. Bowel sounds are normal. She exhibits no distension and no mass. There is no tenderness.  Genitourinary:  Genitourinary Comments: GU exam deferred, denies problems  Musculoskeletal: She exhibits no edema or deformity.  Lymphadenopathy:    She has no cervical adenopathy.  Neurological: She is alert and oriented to person, place, and time. Coordination normal.  Skin: Skin is warm and dry.  Psychiatric: She has a normal mood and affect.  Nursing note and vitals reviewed.         Assessment & Plan:  Annual physical exam Adult wellness-complete.wellness physical was conducted today. Importance of diet and exercise were discussed in detail.  In addition to this a discussion regarding safety was also covered. We also  reviewed over  immunizations and gave recommendations regarding current immunization needed for age.  In addition to this additional areas were also touched on including: Preventative health exams needed:  Colonoscopy: due 2024 Mammogram: UTD, due yearly 2020  Patient was advised yearly wellness exam  Lab work reviewed with patient. LDL elevated, ASCVD risk calculator recommends statin therapy, pt declined. Encouraged healthy diet and exercise.  Pt requesting refill on xanax prescription. Takes rarely. PMP database checked. Refill sent into her pharmacy. Pt denies SI/HI or adverse effects. Only takes at night.   Dr. Richardson Landry was consulted on this case and is in agreement with the above treatment plan.

## 2018-02-28 ENCOUNTER — Other Ambulatory Visit: Payer: Self-pay | Admitting: Family Medicine

## 2018-02-28 ENCOUNTER — Telehealth: Payer: Self-pay | Admitting: Family Medicine

## 2018-02-28 MED ORDER — ALPRAZOLAM 0.25 MG PO TABS: 0.2500 mg | ORAL_TABLET | Freq: Every day | ORAL | 2 refills | Status: DC | PRN

## 2018-02-28 NOTE — Telephone Encounter (Signed)
Med refill for: ALPRAZolam Duanne Moron) 0.25 MG tablet   Pharmacy: [facility], [place]  Refill that was sent on 02/23/18 pt checked with pharmacy refill still not received

## 2018-02-28 NOTE — Telephone Encounter (Signed)
Patient notified and verbalized understanding. 

## 2018-02-28 NOTE — Telephone Encounter (Signed)
Refill sent into pharmacy again.

## 2018-03-07 ENCOUNTER — Telehealth: Payer: Self-pay

## 2018-03-07 DIAGNOSIS — I472 Ventricular tachycardia: Secondary | ICD-10-CM

## 2018-03-07 DIAGNOSIS — I4729 Other ventricular tachycardia: Secondary | ICD-10-CM

## 2018-03-07 NOTE — Telephone Encounter (Signed)
The patient has been notified of the result and verbalized understanding.  Patient made aware that Dr. Meda Coffee would like to order a Cardiac MRI. Patient made aware that this will be pre-approved thorugh ehr insurance prior to scheudleing. Patient made aware that she will also need BMET prior to and understands that this will be scheduled once her Cardiac MRI is scheduled. Patient verbalized udnerstanding and thanked me for the call. All questions (if any) were answered. Cleon Gustin, RN 03/07/2018 4:49 PM

## 2018-03-07 NOTE — Telephone Encounter (Signed)
-----   Message from Imogene Burn, PA-C sent at 03/06/2018  3:10 PM EST ----- Cardiac monitor showed 3 episodes of NSVT.  Dr. Meda Coffee read this.  Dr. Meda Coffee recommends patient have a cardiac MRI to evaluate her NSVT.  Please order have Ivy order!!

## 2018-03-13 NOTE — Telephone Encounter (Signed)
Pts Cardiac MRI is scheduled for 03/28/18 at 12 pm. Pt made aware of appt date and time by [facility] scheduler.

## 2018-03-21 DIAGNOSIS — G245 Blepharospasm: Secondary | ICD-10-CM | POA: Diagnosis not present

## 2018-03-28 ENCOUNTER — Ambulatory Visit (HOSPITAL_COMMUNITY)
Admission: RE | Admit: 2018-03-28 | Discharge: 2018-03-28 | Disposition: A | Discharge status: Home / Self Care | Payer: Medicare Other | Source: Ambulatory Visit | Attending: Cardiology | Admitting: Cardiology

## 2018-03-28 DIAGNOSIS — I472 Ventricular tachycardia: Secondary | ICD-10-CM | POA: Diagnosis not present

## 2018-03-28 DIAGNOSIS — I081 Rheumatic disorders of both mitral and tricuspid valves: Secondary | ICD-10-CM | POA: Insufficient documentation

## 2018-03-28 DIAGNOSIS — I4729 Other ventricular tachycardia: Secondary | ICD-10-CM

## 2018-03-28 LAB — CREATININE, SERUM
Creatinine, Ser: 0.72 mg/dL (ref 0.44–1.00)
GFR calc Af Amer: >60 mL/min (ref >60)
GFR calc non Af Amer: >60 mL/min (ref >60)

## 2018-03-28 MED ORDER — GADOBUTROL 1 MMOL/ML IV SOLN
7.5000 mL | Freq: Once | INTRAVENOUS | Status: AC | PRN
  Administered 2018-03-28: 7.5 mL via INTRAVENOUS

## 2018-03-31 DIAGNOSIS — H2589 Other age-related cataract: Secondary | ICD-10-CM | POA: Diagnosis not present

## 2018-03-31 DIAGNOSIS — H469 Unspecified optic neuritis: Secondary | ICD-10-CM | POA: Diagnosis not present

## 2018-03-31 DIAGNOSIS — H35372 Puckering of macula, left eye: Secondary | ICD-10-CM | POA: Diagnosis not present

## 2018-03-31 DIAGNOSIS — H2513 Age-related nuclear cataract, bilateral: Secondary | ICD-10-CM | POA: Diagnosis not present

## 2018-03-31 DIAGNOSIS — H43392 Other vitreous opacities, left eye: Secondary | ICD-10-CM | POA: Diagnosis not present

## 2018-03-31 DIAGNOSIS — H43813 Vitreous degeneration, bilateral: Secondary | ICD-10-CM | POA: Diagnosis not present

## 2018-03-31 DIAGNOSIS — Z8669 Personal history of other diseases of the nervous system and sense organs: Secondary | ICD-10-CM | POA: Diagnosis not present

## 2018-03-31 DIAGNOSIS — H35033 Hypertensive retinopathy, bilateral: Secondary | ICD-10-CM | POA: Diagnosis not present

## 2018-03-31 DIAGNOSIS — Z79899 Other long term (current) drug therapy: Secondary | ICD-10-CM | POA: Diagnosis not present

## 2018-04-03 NOTE — Progress Notes (Signed)
Cardiology Office Note    Date:  04/04/2018   ID:  [name] [name], DOB March 06, 1941, MRN [id number]  PCP:  Mikey Kirschner, MD  Cardiologist: Ena Dawley, MD EPS: None  No chief complaint on file.   History of Present Illness:  [name] [name] is a 77 y.o. female with history of MVP, bradycardia tachycardia syndrome, palpitations, HLD, hypertension, normal coronary arteries and LV function on cath in 10/16/13.  Holter monitor in the past showed 2 episodes of NSVT lasting 14 and 11 cycles.  Cardiac MRI showed normal biventricular function and ruled out infiltrative or inflammatory cardiomyopathy. Repeat echo in March 2017 showed normal biventricular size thickness and function. She was evaluated by EPS and recommended she take beta blocker and watchful waiting. Flecainide would be another medical option in conjunction with her beta blocker if she has bothersome symptoms.    I saw the patient 01/31/2018 at which time she was having some dizziness with her palpitations but it passed quickly.  Palpitations have improved on atenolol.  Did not tolerate metoprolol.  Also was having some lower extremity edema felt secondary to venous stasis.  2D echo 02/10/2018 showed normal LV function with mild diastolic dysfunction and mild MR similar to prior echo.  30-day monitor showed 3 episodes of NSVT.  2 episodes were 5 beat and one episode was 6 beats.  Dr. Meda Coffee ordered a cardiac MRI which was normal.  Patient comes in today for follow-up.  She continues to have palpitations but has had no further dizziness.  It does not occur every day.  Some days she has a lot of fatigue and wonders if it is related.   Past Medical History:  Diagnosis Date  . Anal sphincter incompetence 12/31/2015   Manometry 12/2015  . Arthritis   . Brady-tachy syndrome ([place])   . Chest pain 10/16/13   normal coronary arteries on cath and normal EF  . Colitis, ischemic ([place])   . Colon polyp 07/29/1993   with focal  adenomatous changes  . Diverticulitis   . Diverticulosis of colon   . Dyspepsia   . Dyssynergic defecation 12/31/2015   Anorectal mano 12/2015, also has rectal hypersensitivity  . Fatty liver   . GERD (gastroesophageal reflux disease)   . Heart murmur   . HLD (hyperlipidemia)   . Hypertension   . IBS (irritable bowel syndrome)   . Incontinence, feces   . MVP (mitral valve prolapse)   . nhl dx'd 07/1999   chemo comp 2001; rituxin comp 2005  . Osteopenia   . Palpitation   . Rectocele   . Rectocele   . Vasovagal syncope     Past Surgical History:  Procedure Laterality Date  . ABDOMINAL HYSTERECTOMY    . ABDOMINAL SURGERY  2001    for non hodgkins lymphoma; lymphoma in mesentary  . ANAL RECTAL MANOMETRY N/A 12/24/2015   Procedure: ANO RECTAL MANOMETRY;  Surgeon: Gatha Mayer, MD;  Location: [facility] ENDOSCOPY;  Service: Endoscopy;  Laterality: N/A;  . BLADDER SUSPENSION    . BUNIONECTOMY    . CHOLECYSTECTOMY  2003  . COLONOSCOPY  7/200/, 11/2007   diverticulosis  . LEFT HEART CATHETERIZATION WITH CORONARY ANGIOGRAM N/A 10/16/2013   Procedure: LEFT HEART CATHETERIZATION WITH CORONARY ANGIOGRAM;  Surgeon: Peter M Martinique, MD;  Location: [facility] CATH LAB;  Service: Cardiovascular;  Laterality: N/A;  . pelvic prolapse repair    . UPPER GASTROINTESTINAL ENDOSCOPY  10/2005   hiatal hernia    Current Medications: Current  Meds  Medication Sig  . ALPRAZolam (XANAX) 0.25 MG tablet Take 1 tablet (0.25 mg total) by mouth daily as needed for anxiety.  Marland Kitchen aspirin EC 81 MG tablet Take 81 mg by mouth daily.  Marland Kitchen atenolol (TENORMIN) 25 MG tablet Take 1.5 tablets (37.5 mg total) by mouth daily. TAKE ONE (1) TABLET BY MOUTH EVERY DAY  . clobetasol ointment (TEMOVATE) 9.60 % Apply 1 application topically daily as needed (dermatitis).   . fexofenadine (ALLEGRA) 180 MG tablet Take 180 mg by mouth daily. Reported on 4/54/0981  . folic acid (FOLVITE) 1 MG tablet Take 1 mg by mouth.  . NON FORMULARY Fiber one- one  heaping tablespoon nightly  . pantoprazole (PROTONIX) 40 MG tablet TAKE ONE (1) TABLET BY MOUTH EVERY DAY BEFORE BREAKFAST  . Polyvinyl Alcohol-Povidone (REFRESH OP) Apply 1 drop to eye 3 (three) times daily.  . Probiotic Product (ALIGN PO) Take by mouth as directed. Alternates between Gustavus per PCP orders every few months  . [DISCONTINUED] atenolol (TENORMIN) 25 MG tablet TAKE ONE (1) TABLET BY MOUTH EVERY DAY     Allergies:   Glycopyrrolate; Methscopolamine; Other; Sulfa antibiotics; Sulfonamide derivatives; Latex; Ace inhibitors; Codeine; Iodine; and Iohexol   Social History   Socioeconomic History  . Marital status: Widowed    Spouse name: Not on file  . Number of children: 1  . Years of education: Not on file  . Highest education level: Not on file  Occupational History  . 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 Financial resource strain: Not on file  . Food insecurity:    Worry: Not on file    Inability: Not on file  . Transportation needs:    Medical: Not on file    Non-medical: Not on file  Tobacco Use  . Smoking status: Former Smoker    Packs/day: 0.33  . Smokeless tobacco: Never Used  . Tobacco comment: quit 1976  Substance and Sexual Activity  . Alcohol use: Yes    Alcohol/week: 0.0 standard drinks    Comment: very rare  . Drug use: No  . Sexual activity: Yes    Birth control/protection: Surgical  Lifestyle  . Physical activity:    Days per week: Not on file    Minutes per session: Not on file  . Stress: Not on file  Relationships  . Social connections:    Talks on phone: Not on file    Gets together: Not on file    Attends religious service: Not on file    Active member of club or organization: Not on file    Attends meetings of clubs or organizations: Not on file    Relationship status: Not on file  Other Topics Concern  . Not on file  Social History Narrative   Retired, widowed, 1 child    Rare EtOH, former smoker none now   No drugs     Family History:  The patient's family history includes Colon cancer (age of onset: 76) in her father; Colon polyps in her brother and brother; Prostate cancer in her brother; Stomach cancer in her maternal grandfather and maternal uncle.   ROS:   Please see the history of present illness.    ROS All other systems reviewed and are negative.   PHYSICAL EXAM:   VS:  BP 112/64   Pulse 70   Ht 5\' 2"  (1.575 m)   Wt 135 lb 3.2 oz (61.3 kg)  SpO2 96%   BMI 24.73 kg/m   Physical Exam  GEN: Well nourished, well developed, in no acute distress  HEENT: normal  Neck: no JVD, carotid bruits, or masses Cardiac:RRR; no murmurs, rubs, or gallops  Respiratory:  clear to auscultation bilaterally, normal work of breathing GI: soft, nontender, nondistended, + BS Ext: without cyanosis, clubbing, or edema, Good distal pulses bilaterally MS: no deformity or atrophy  Skin: warm and dry, no rash Neuro:  Alert and Oriented x 3, Strength and sensation are intact Psych: euthymic mood, full affect  Wt Readings from Last 3 Encounters:  04/04/18 135 lb 3.2 oz (61.3 kg)  02/23/18 135 lb (61.2 kg)  01/31/18 135 lb 9.6 oz (61.5 kg)      Studies/Labs Reviewed:   EKG:  EKG is not ordered today.   Recent Labs: 02/11/2018: ALT 36; BUN 15; Hemoglobin 14.7; Platelets 276; Potassium 4.7; Sodium 144 03/28/2018: Creatinine, Ser 0.72   Lipid Panel    Component Value Date/Time   CHOL 215 (H) 02/11/2018 0827   TRIG 89 02/11/2018 0827   HDL 53 02/11/2018 0827   CHOLHDL 4.1 02/11/2018 0827   CHOLHDL 4.6 02/06/2013 0806   VLDL 33 02/06/2013 0806   LDLCALC 144 (H) 02/11/2018 0827    Additional studies/ records that were reviewed today include:  2D echo 10/11/2019Study Conclusions   - Left ventricle: The cavity size was normal. Wall thickness was   normal. Systolic function was normal. The estimated ejection   fraction was in the range of 55% to 60%. Wall  motion was normal;   there were no regional wall motion abnormalities. Doppler   parameters are consistent with abnormal left ventricular   relaxation (grade 1 diastolic dysfunction). - Mitral valve: There was mild regurgitation.   Impressions:   - Normal LV systolic function; mild diastolic dysfunction; mild MR.  30-day monitor 03/06/2018 sinus bradycardia to sinus tachycardia with 3 episodes of NSVT: 2 5 beats and one 6 beat  Cardiac MRI IMPRESSION: 1. Normal left ventricular size, thickness and systolic function (LVEF =39%). There is paradoxical septal motion. There is no late gadolinium enhancement in the left ventricular myocardium.   2. Normal right ventricular size, thickness and systolic function (LVEF = 63%). There are no regional wall motion abnormalities.   3.  Normal left and right atrial size.   4. Normal size of the aortic root, ascending aorta and pulmonary artery.   5. Anterior mitral valve leaflet prolapse with mitral regurgitation. Mild tricuspid regurgitation.   6.  Normal pericardium.  No pericardial effusion.   Normal cardiac MRI without evidence for inflammatory cardiomyopathy or ARVC.     Electronically Signed   By: Ena Dawley   On: 03/28/2018 19:07    ASSESSMENT:    1. NSVT (nonsustained ventricular tachycardia) ([place])   2. MVP (mitral valve prolapse)   3. Essential hypertension   4. Mixed hyperlipidemia      PLAN:  In order of problems listed above:  NSVT 3 episodes on recent 30-day monitor, to 5 beat runs and 1 6 beat run.  MRI ordered by Dr. Meda Coffee and was normal.  Normal LV function on echo.  Continues to have palpitations.  Will try to increase atenolol 25 mg 1/2 tablets daily.  She did have some bradycardia down to 52 in the sleeping hours on Holter monitor.  She will keep track of her pulse and call if it gets below 50.  Consider referral back to EP for flecainide if it continues  to be bothersome.  Follow-up with Dr. Meda Coffee in 3  to 4 months.  Check with PCP to see if thyroid has been checked recently for her fatigue . MVP with mild MR on recent echo  Essential hypertension blood pressure normal.  Hyperlipidemia LDL 144.  Patient does not have history of coronary disease.  She is never been on a statin.  She would like to try diet.  Medication Adjustments/Labs and Tests Ordered: Current medicines are reviewed at length with the patient today.  Concerns regarding medicines are outlined above.  Medication changes, Labs and Tests ordered today are listed in the Patient Instructions below. Patient Instructions  Medication Instructions:  Your physician has recommended you make the following change in your medication:   INCREASE: atenolol 25 mg tablet: Take 1 1/2 tablets (37.5 mg total) by mouth once a day   If you need a refill on your cardiac medications before your next appointment, please call your pharmacy.   Lab work: None Ordered  Have your Primary Care Doctor Check your Thyroid Function  If you have labs (blood work) drawn today and your tests are completely normal, you will receive your results only by: Marland Kitchen MyChart Message (if you have MyChart) OR . A paper copy in the mail If you have any lab test that is abnormal or we need to change your treatment, we will call you to review the results.  Testing/Procedures: None ordered  Follow-Up: . Follow up with Dr. Meda Coffee on 07/17/18 at 8:40 AM  Any Other Special Instructions Will Be Listed Below (If Applicable).       Sumner Boast, PA-C  04/04/2018 8:15 AM    [facility] [place], [place], [place]  [address] Phone: [phone]; Fax: [phone]

## 2018-04-04 ENCOUNTER — Encounter: Payer: Self-pay | Admitting: Physician Assistant

## 2018-04-04 ENCOUNTER — Telehealth: Payer: Self-pay | Admitting: Physician Assistant

## 2018-04-04 ENCOUNTER — Ambulatory Visit (INDEPENDENT_AMBULATORY_CARE_PROVIDER_SITE_OTHER): Payer: Medicare Other | Admitting: Physician Assistant

## 2018-04-04 VITALS — BP 112/64 | HR 70 | Ht 62.0 in | Wt 135.2 lb

## 2018-04-04 DIAGNOSIS — I4729 Other ventricular tachycardia: Secondary | ICD-10-CM

## 2018-04-04 DIAGNOSIS — I472 Ventricular tachycardia: Secondary | ICD-10-CM | POA: Diagnosis not present

## 2018-04-04 DIAGNOSIS — I10 Essential (primary) hypertension: Secondary | ICD-10-CM

## 2018-04-04 DIAGNOSIS — I341 Nonrheumatic mitral (valve) prolapse: Secondary | ICD-10-CM

## 2018-04-04 DIAGNOSIS — E782 Mixed hyperlipidemia: Secondary | ICD-10-CM | POA: Diagnosis not present

## 2018-04-04 MED ORDER — ATENOLOL 25 MG PO TABS: 37.5000 mg | ORAL_TABLET | Freq: Every day | ORAL | 3 refills | Status: DC

## 2018-04-04 MED ORDER — ATENOLOL 25 MG PO TABS: 37.5000 mg | ORAL_TABLET | Freq: Every day | ORAL | 11 refills | Status: DC

## 2018-04-04 NOTE — Telephone Encounter (Signed)
New Message   Pt c/o medication issue:  1. Name of Medication: atenolol (TENORMIN) 25 MG tablet  2. How are you currently taking this medication (dosage and times per day)?  Take 1.5 tablets (37.5 mg total) by mouth daily. TAKE ONE (1) TABLET BY MOUTH EVERY DAY  3. Are you having a reaction (difficulty breathing--STAT)? no  4. What is your medication issue? Melissa at [facility] is calling, states that they received 2 sets of directions one for 1.5 tablets daily, one for 1 tablet daily and she needs clarification on which one it should be. Please call

## 2018-04-04 NOTE — Patient Instructions (Signed)
Medication Instructions:  Your physician has recommended you make the following change in your medication:   INCREASE: atenolol 25 mg tablet: Take 1 1/2 tablets (37.5 mg total) by mouth once a day   If you need a refill on your cardiac medications before your next appointment, please call your pharmacy.   Lab work: None Ordered  Have your Primary Care Doctor Check your Thyroid Function  If you have labs (blood work) drawn today and your tests are completely normal, you will receive your results only by: Marland Kitchen MyChart Message (if you have MyChart) OR . A paper copy in the mail If you have any lab test that is abnormal or we need to change your treatment, we will call you to review the results.  Testing/Procedures: None ordered  Follow-Up: . Follow up with Dr. Meda Coffee on 07/17/18 at 8:40 AM  Any Other Special Instructions Will Be Listed Below (If Applicable).

## 2018-04-04 NOTE — Addendum Note (Signed)
Addended by: Drue Novel I on: 04/04/2018 01:43 PM   Modules accepted: Orders

## 2018-04-04 NOTE — Telephone Encounter (Signed)
Spoke with Jonni Sanger the pharmacist, corrected the dose, she is taking 37.5 mg, daily. They also requested a 90 day supply, instead of 30 day. Lennox Grumbles nurse resent the order with 90 day.

## 2018-05-04 DIAGNOSIS — Z6823 Body mass index (BMI) 23.0-23.9, adult: Secondary | ICD-10-CM | POA: Diagnosis not present

## 2018-05-04 DIAGNOSIS — M810 Age-related osteoporosis without current pathological fracture: Secondary | ICD-10-CM | POA: Diagnosis not present

## 2018-05-04 DIAGNOSIS — M150 Primary generalized (osteo)arthritis: Secondary | ICD-10-CM | POA: Diagnosis not present

## 2018-05-04 DIAGNOSIS — M79674 Pain in right toe(s): Secondary | ICD-10-CM | POA: Diagnosis not present

## 2018-05-04 DIAGNOSIS — M2550 Pain in unspecified joint: Secondary | ICD-10-CM | POA: Diagnosis not present

## 2018-05-25 DIAGNOSIS — M25511 Pain in right shoulder: Secondary | ICD-10-CM | POA: Diagnosis not present

## 2018-05-25 DIAGNOSIS — M7541 Impingement syndrome of right shoulder: Secondary | ICD-10-CM | POA: Diagnosis not present

## 2018-05-29 ENCOUNTER — Telehealth: Payer: Self-pay | Admitting: *Deleted

## 2018-05-29 ENCOUNTER — Encounter: Payer: Self-pay | Admitting: Physician Assistant

## 2018-05-29 ENCOUNTER — Ambulatory Visit (INDEPENDENT_AMBULATORY_CARE_PROVIDER_SITE_OTHER): Payer: Medicare Other | Admitting: Physician Assistant

## 2018-05-29 ENCOUNTER — Other Ambulatory Visit (INDEPENDENT_AMBULATORY_CARE_PROVIDER_SITE_OTHER): Payer: Medicare Other

## 2018-05-29 VITALS — BP 130/60 | HR 60 | Ht 62.25 in | Wt 134.0 lb

## 2018-05-29 DIAGNOSIS — K219 Gastro-esophageal reflux disease without esophagitis: Secondary | ICD-10-CM

## 2018-05-29 DIAGNOSIS — R1012 Left upper quadrant pain: Secondary | ICD-10-CM

## 2018-05-29 DIAGNOSIS — R1310 Dysphagia, unspecified: Secondary | ICD-10-CM

## 2018-05-29 DIAGNOSIS — Z8572 Personal history of non-Hodgkin lymphomas: Secondary | ICD-10-CM

## 2018-05-29 LAB — CBC WITH DIFFERENTIAL/PLATELET
BASOS PCT: 0.8 % (ref 0.0–3.0)
Basophils Absolute: 0.1 10*3/uL (ref 0.0–0.1)
Eosinophils Absolute: 0.3 10*3/uL (ref 0.0–0.7)
Eosinophils Relative: 3.6 % (ref 0.0–5.0)
HCT: 43.8 % (ref 36.0–46.0)
Hemoglobin: 14.8 g/dL (ref 12.0–15.0)
Lymphocytes Relative: 23.6 % (ref 12.0–46.0)
Lymphs Abs: 1.9 10*3/uL (ref 0.7–4.0)
MCHC: 33.8 g/dL (ref 30.0–36.0)
MCV: 89.2 fl (ref 78.0–100.0)
MONO ABS: 0.9 10*3/uL (ref 0.1–1.0)
Monocytes Relative: 10.9 % (ref 3.0–12.0)
Neutro Abs: 4.9 10*3/uL (ref 1.4–7.7)
Neutrophils Relative %: 61.1 % (ref 43.0–77.0)
Platelets: 255 10*3/uL (ref 150.0–400.0)
RBC: 4.91 Mil/uL (ref 3.87–5.11)
RDW: 13.2 % (ref 11.5–15.5)
WBC: 8.1 10*3/uL (ref 4.0–10.5)

## 2018-05-29 LAB — BASIC METABOLIC PANEL
BUN: 16 mg/dL (ref 6–23)
CO2: 30 mEq/L (ref 19–32)
CREATININE: 0.78 mg/dL (ref 0.40–1.20)
Calcium: 10.9 mg/dL — ABNORMAL HIGH (ref 8.4–10.5)
Chloride: 104 mEq/L (ref 96–112)
GFR: 71.54 mL/min (ref >60.00)
Glucose, Bld: 96 mg/dL (ref 70–99)
Potassium: 4.5 mEq/L (ref 3.5–5.1)
Sodium: 140 mEq/L (ref 135–145)

## 2018-05-29 MED ORDER — PREDNISONE 50 MG PO TABS: ORAL_TABLET | ORAL | 0 refills | Status: DC

## 2018-05-29 NOTE — Progress Notes (Deleted)
See note from 05-29-18.

## 2018-05-29 NOTE — Telephone Encounter (Signed)
Stacy from [facility] gave the patient the Prednisone and Benedryl instructions since the patient is allergic to the IV contrast.  I called the patient to advise I sent the script for the Prednisone 50 mg to [facility].  I asked her if she had Benedryl and she said she does not need it and she can take what she has at home. She thanked me for calling her.

## 2018-05-29 NOTE — Progress Notes (Addendum)
Subjective:    Patient ID: [name] [name], female    DOB: Jan 22, 1941, 78 y.o.   MRN: [id number]  HPI [name] [name] is a pleasant 78 year old white female known to Dr. Carlean Purl who was last seen in July 2019 when she had colonoscopy.  She was found to have a diminutive cecal polyp which was a tubular adenoma and multiple sigmoid diverticuli. She comes in today with complaint of left upper quadrant pain which she is feeling up under her ribs on the left side over the past couple of months.  Is has been intermittent and not necessarily present daily.  She has not noticed any exacerbation with oral intake, denies any nausea or vomiting.  She has noticed that she is more uncomfortable lying on her left side has to reposition herself at night to relieve the discomfort.  She also has a second pain in her left lower quadrant which she describes as a more chronic nagging discomfort that has been there for a year or so. She also mentions that her reflux symptoms have been worse over the past 6 to 8 weeks and she has noticed solid food dysphasia generally occurs within the first bite or 2 of food.  She will have some transient discomfort or pain and then food will go on down.  Generally she is okay for the rest of the meal, she has not had any episodes requiring regurgitation.  She is maintained on pantoprazole 40 mg every morning. For her constipation she generally takes Benefiber daily and a probiotic.  She says occasionally she will see some mucus with her stool.  She has been having daily bowel movements. Patient has history of non-Hodgkin's lymphoma of the abdomen diagnosed in 2001, she has been in remission many years.  She has history of chronic constipation, IBS, hypertension and history of nonsustained V. Tach.  Note EGD in August 2014 with finding of a 2 cm hiatal hernia and multiple small sessile gastric polyps.  Biopsies were taken and these were benign.  Patient expresses concerns about recurrence of  lymphoma.  She also had a friend recently who passed away who had had a prior lymphoma, was in remission for many many years and then had recurrence.  Review of Systems Pertinent positive and negative review of systems were noted in the above HPI section.  All other review of systems was otherwise negative.  Outpatient Encounter Medications as of 05/29/2018  Medication Sig  . ALPRAZolam (XANAX) 0.25 MG tablet Take 1 tablet (0.25 mg total) by mouth daily as needed for anxiety.  Marland Kitchen aspirin EC 81 MG tablet Take 81 mg by mouth daily.  Marland Kitchen atenolol (TENORMIN) 25 MG tablet Take 1.5 tablets (37.5 mg total) by mouth daily. TAKE ONE (1) TABLET BY MOUTH EVERY DAY (Patient taking differently: Take 37.5 mg by mouth daily. )  . clobetasol ointment (TEMOVATE) 4.12 % Apply 1 application topically daily as needed (dermatitis).   . fexofenadine (ALLEGRA) 180 MG tablet Take 180 mg by mouth daily. Reported on 8/78/6767  . folic acid (FOLVITE) 1 MG tablet Take 1 mg by mouth.  . NON FORMULARY Fiber one- one heaping tablespoon nightly  . pantoprazole (PROTONIX) 40 MG tablet TAKE ONE (1) TABLET BY MOUTH EVERY DAY BEFORE BREAKFAST  . Polyvinyl Alcohol-Povidone (REFRESH OP) Apply 1 drop to eye 3 (three) times daily.  . Probiotic Product (ALIGN PO) Take by mouth as directed. Alternates between [place] per PCP orders every few months  . TURMERIC PO Take  1 tablet by mouth daily.  . Wheat Dextrin (BENEFIBER) POWD Take 1 Dose by mouth daily.  . predniSONE (DELTASONE) 50 MG tablet Take 1 tab at 10 PM on 2-2.  Take 1 tab 4 AM on 2-3. Take 1 tab at 10 AM on 2-3.   No facility-administered encounter medications on file as of 05/29/2018.    Allergies  Allergen Reactions  . Glycopyrrolate Other (See Comments)    Unable to urinate Could not urinate after it was given  . Methscopolamine Other (See Comments)    Unable to urinate Unable to urinate  . Other Anaphylaxis    Other reaction(s): Facial  swelling And face swelling And face swelling   . Sulfa Antibiotics Swelling and Anaphylaxis    And face swelling Face swelling   . Sulfonamide Derivatives Swelling    Face swelling   . Latex Other (See Comments)    Blisters  . Ace Inhibitors Hives  . Codeine Nausea Only  . Iodine Hives  . Iohexol Hives     Desc: 50 mg benadryl prior to exam    Patient Active Problem List   Diagnosis Date Noted  . Lower extremity edema 01/31/2018  . Myopia 12/27/2016  . Vitreomacular adhesion 12/27/2016  . Floaters, left 10/11/2016  . Macular hole, left eye 10/11/2016  . Nuclear sclerotic cataract of both eyes 10/11/2016  . Optic neuropathy, left 10/11/2016  . Pseudoexfoliation of lens capsule, left eye 10/11/2016  . PVD (posterior vitreous detachment), both eyes 10/11/2016  . Retinal degeneration 10/11/2016  . Retinal edema 10/11/2016  . Peripheral neuropathy 08/26/2016  . Constipation due to outlet dysfunction   . Fecal incontinence   . Anal sphincter incompetence 12/31/2015  . Dyssynergic defecation 12/31/2015  . Heart palpitations 07/02/2015  . Lipoma of arm 06/30/2015  . HTN (hypertension) 04/18/2015  . Dizziness 04/18/2015  . Bilateral ocular hypertension 11/19/2014  . Dry eye syndrome 11/19/2014  . Glaucoma suspect 11/19/2014  . Macular cyst, hole, or pseudohole of retina, left eye 11/19/2014  . DOE (dyspnea on exertion) 11/27/2013  . NSVT (nonsustained ventricular tachycardia) ([place]) 11/27/2013  . Abdominal pain, unspecified site 11/07/2013  . Tachycardia 10/30/2013  . Dyspnea on exertion 10/01/2013  . Allergic rhinitis 08/20/2013  . Diverticulitis of colon 01/08/2011  . Chest pain on exertion 10/01/2010  . Hyperlipidemia 08/27/2008  . MVP (mitral valve prolapse) 08/27/2008  . Palpitations 08/27/2008  . DYSPEPSIA 10/13/2007  .  IBS- diarrhea predominant 10/13/2007  . DIVERTICULOSIS OF COLON 06/16/2007  . OSTEOPENIA 06/16/2007  . NHL (non-Hodgkin's lymphoma) ([place])  06/16/2007   Social History   Socioeconomic History  . Marital status: Widowed    Spouse name: Not on file  . Number of children: 1  . Years of education: Not on file  . Highest education level: Not on file  Occupational History  . 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 Financial resource strain: Not on file  . Food insecurity:    Worry: Not on file    Inability: Not on file  . Transportation needs:    Medical: Not on file    Non-medical: Not on file  Tobacco Use  . Smoking status: Former Smoker    Packs/day: 0.33  . Smokeless tobacco: Never Used  . Tobacco comment: quit 1976  Substance and Sexual Activity  . Alcohol use: Yes    Alcohol/week: 0.0 standard drinks    Comment: very rare  . Drug use: No  .  Sexual activity: Yes    Birth control/protection: Surgical  Lifestyle  . Physical activity:    Days per week: Not on file    Minutes per session: Not on file  . Stress: Not on file  Relationships  . Social connections:    Talks on phone: Not on file    Gets together: Not on file    Attends religious service: Not on file    Active member of club or organization: Not on file    Attends meetings of clubs or organizations: Not on file    Relationship status: Not on file  . Intimate partner violence:    Fear of current or ex partner: Not on file    Emotionally abused: Not on file    Physically abused: Not on file    Forced sexual activity: Not on file  Other Topics Concern  . Not on file  Social History Narrative   Retired, widowed, 1 child   Rare EtOH, former smoker none now   No drugs    Ms. Pylant's family history includes Colon cancer (age of onset: 78) in her father; Colon polyps in her brother and brother; Prostate cancer in her brother; Stomach cancer in her maternal grandfather and maternal uncle.      Objective:    Vitals:   05/29/18 0956  BP: 130/60  Pulse: 60    Physical Exam; well-developed elderly white  female in no acute distress, very pleasant.  BMI 24.3.  HEENT; nontraumatic normocephalic EOMI PERRLA sclera anicteric, oral mucosa moist, Cardiovascular; regular rate and rhythm with S1-S2, Pulmonary; clear bilaterally, Abdomen ;soft, bowel sounds are present, there is no palpable mass or hepatosplenomegaly, she does have some tenderness in the left upper quadrant and epigastrium.  No tenderness over the left lower ribs or costal margin.  Rectal; exam not done, Extremities ;no clubbing cyanosis or edema skin warm and dry, Neuropsych; alert and oriented, grossly nonfocal mood and affect appropriate       Assessment & Plan:   #20 78 year old white female with 71-month history of intermittent left upper quadrant and epigastric pain, patient has also noticed increase in discomfort with lying on her left side. Etiology is not clear, rule out intra-abdominal inflammatory process, neoplasm, recurrent lymphoma,. #2 chronic GERD with 6 to 8-week history of solid food dysphasia generally just noticed at the beginning of the meal.  Rule out esophageal stricture #3 history of non-Hodgkin's lymphoma diagnosed 2001, treated and in remission many years #4.  History of adenomatous colon polyps-patient just had colonoscopy July 2019 #5.  Diverticulosis #6.  Constipation/dyssynergy #7.  IBS  #8 hypertension  Plan; CBC,BMET Schedule for CT of the abdomen and pelvis. Scheduled for upper endoscopy with possible dilation with Dr. Carlean Purl.  Procedure was discussed in detail with the patient including indications risks and benefits and she is agreeable to proceed. Continue Protonix 40 mg p.o. every morning Further plans pending results of above.     Amy Genia Harold PA-C 05/29/2018   Cc: Mikey Kirschner, MD  CT showed a suspicious mesenteric mass, enlarging - bx schedukled Gatha Mayer, MD, [facility]

## 2018-05-29 NOTE — Patient Instructions (Addendum)
Your provider has requested that you go to the basement level for lab work before leaving today. Press "B" on the elevator. The lab is located at the first door on the left as you exit the elevator. We are sending refills to your pharmacy , [facility], [place].  You have been scheduled for an endoscopy. Please follow written instructions given to you at your visit today. If you use inhalers (even only as needed), please bring them with you on the day of your procedure.  You have been scheduled for a CT scan of the abdomen and pelvis at [facility] ([address]---this is in the same building as [facility]).   You are scheduled on 06-05-2018 at 11:00 am. You should arrive at 8:45 am for you to drink the clear oral contrast.   WARNING: IF YOU ARE ALLERGIC TO IODINE/X-RAY DYE, PLEASE NOTIFY RADIOLOGY IMMEDIATELY AT [phone]! YOU WILL BE GIVEN A 13 HOUR PREMEDICATION PREP.  1) Do not eat  anything after 7:00 am(4 hours prior to your test) You will be given clear oral contrast to drink at 9:00  And 10:00 am.  You may take any medications as prescribed with a small amount of water, if necessary. If you take any of the following medications: METFORMIN, GLUCOPHAGE, GLUCOVANCE, AVANDAMET, RIOMET, FORTAMET, [place] MET, JANUMET, GLUMETZA or METAGLIP, you MAY be asked to HOLD this medication 48 hours AFTER the exam.  The purpose of you drinking the oral contrast is to aid in the visualization of your intestinal tract. The contrast solution may cause some diarrhea. Depending on your individual set of symptoms, you may also receive an intravenous injection of x-ray contrast/dye. Plan on being at [facility] for 30 minutes or longer, depending on the type of exam you are having performed.  This test typically takes 30-45 minutes to complete.  Normal BMI (Body Mass Index- based on height and weight) is between 23 and 30. Your BMI today is Body mass  index is 24.31 kg/m. Marland Kitchen Please consider follow up  regarding your BMI with your Primary Care Provider.  If you have any questions regarding your exam or if you need to reschedule, you may call the CT department at [phone] between the hours of 8:00 am and 5:00 pm, Monday-Friday.  ________________________________________________________________________

## 2018-06-05 ENCOUNTER — Ambulatory Visit (INDEPENDENT_AMBULATORY_CARE_PROVIDER_SITE_OTHER)
Admission: RE | Admit: 2018-06-05 | Discharge: 2018-06-05 | Disposition: A | Discharge status: Home / Self Care | Payer: Medicare Other | Source: Ambulatory Visit | Attending: Physician Assistant | Admitting: Physician Assistant

## 2018-06-05 ENCOUNTER — Inpatient Hospital Stay: Admission: RE | Admit: 2018-06-05 | Payer: Medicare Other | Source: Ambulatory Visit

## 2018-06-05 DIAGNOSIS — K219 Gastro-esophageal reflux disease without esophagitis: Secondary | ICD-10-CM

## 2018-06-05 DIAGNOSIS — Z8572 Personal history of non-Hodgkin lymphomas: Secondary | ICD-10-CM

## 2018-06-05 DIAGNOSIS — R1012 Left upper quadrant pain: Secondary | ICD-10-CM

## 2018-06-05 DIAGNOSIS — K5730 Diverticulosis of large intestine without perforation or abscess without bleeding: Secondary | ICD-10-CM | POA: Diagnosis not present

## 2018-06-05 DIAGNOSIS — R1310 Dysphagia, unspecified: Secondary | ICD-10-CM

## 2018-06-05 MED ORDER — IOPAMIDOL (ISOVUE-300) INJECTION 61%
100.0000 mL | Freq: Once | INTRAVENOUS | Status: AC | PRN
  Administered 2018-06-05: 100 mL via INTRAVENOUS

## 2018-06-05 NOTE — Progress Notes (Signed)
I think getting a biopsy set up with IR makes sense so please arrange I also cced Dr. Julien Nordmann

## 2018-06-08 ENCOUNTER — Other Ambulatory Visit: Payer: Self-pay

## 2018-06-08 DIAGNOSIS — Z8572 Personal history of non-Hodgkin lymphomas: Secondary | ICD-10-CM

## 2018-06-08 DIAGNOSIS — R935 Abnormal findings on diagnostic imaging of other abdominal regions, including retroperitoneum: Secondary | ICD-10-CM

## 2018-06-12 ENCOUNTER — Telehealth: Payer: Self-pay | Admitting: Physician Assistant

## 2018-06-12 NOTE — Telephone Encounter (Signed)
I do not see an issue here OK to proceed as planned and if she has problems that come up we will deal with them

## 2018-06-12 NOTE — Telephone Encounter (Signed)
She is scheduled for the biopsy of the mesenteric mass in question from her recent CT on 06/16/18 She is scheduled for the EGD on 06/19/18 Is this okay as far as scheduling? Patient is concerned.

## 2018-06-12 NOTE — Telephone Encounter (Signed)
Pt called stating that we were going to schedule her for a biopsy before her procedure next week on 2/17. Pls call her.

## 2018-06-13 NOTE — Telephone Encounter (Signed)
Left a message on her voicemail  

## 2018-06-15 ENCOUNTER — Other Ambulatory Visit: Payer: Self-pay | Admitting: Radiology

## 2018-06-16 ENCOUNTER — Other Ambulatory Visit: Payer: Self-pay

## 2018-06-16 ENCOUNTER — Encounter (HOSPITAL_COMMUNITY): Payer: Self-pay

## 2018-06-16 ENCOUNTER — Ambulatory Visit (HOSPITAL_COMMUNITY)
Admission: RE | Admit: 2018-06-16 | Discharge: 2018-06-16 | Disposition: A | Discharge status: Home / Self Care | Payer: Medicare Other | Source: Ambulatory Visit | Attending: Physician Assistant | Admitting: Physician Assistant

## 2018-06-16 DIAGNOSIS — R935 Abnormal findings on diagnostic imaging of other abdominal regions, including retroperitoneum: Secondary | ICD-10-CM | POA: Diagnosis not present

## 2018-06-16 DIAGNOSIS — Z7982 Long term (current) use of aspirin: Secondary | ICD-10-CM | POA: Diagnosis not present

## 2018-06-16 DIAGNOSIS — Z800 Family history of malignant neoplasm of digestive organs: Secondary | ICD-10-CM | POA: Diagnosis not present

## 2018-06-16 DIAGNOSIS — Z885 Allergy status to narcotic agent status: Secondary | ICD-10-CM | POA: Insufficient documentation

## 2018-06-16 DIAGNOSIS — Z91040 Latex allergy status: Secondary | ICD-10-CM | POA: Diagnosis not present

## 2018-06-16 DIAGNOSIS — I341 Nonrheumatic mitral (valve) prolapse: Secondary | ICD-10-CM | POA: Diagnosis not present

## 2018-06-16 DIAGNOSIS — Z91041 Radiographic dye allergy status: Secondary | ICD-10-CM | POA: Insufficient documentation

## 2018-06-16 DIAGNOSIS — Z9109 Other allergy status, other than to drugs and biological substances: Secondary | ICD-10-CM | POA: Diagnosis not present

## 2018-06-16 DIAGNOSIS — Z8572 Personal history of non-Hodgkin lymphomas: Secondary | ICD-10-CM | POA: Diagnosis not present

## 2018-06-16 DIAGNOSIS — K219 Gastro-esophageal reflux disease without esophagitis: Secondary | ICD-10-CM | POA: Insufficient documentation

## 2018-06-16 DIAGNOSIS — R1012 Left upper quadrant pain: Secondary | ICD-10-CM | POA: Diagnosis not present

## 2018-06-16 DIAGNOSIS — R1909 Other intra-abdominal and pelvic swelling, mass and lump: Secondary | ICD-10-CM | POA: Diagnosis not present

## 2018-06-16 DIAGNOSIS — I10 Essential (primary) hypertension: Secondary | ICD-10-CM | POA: Diagnosis not present

## 2018-06-16 DIAGNOSIS — M1990 Unspecified osteoarthritis, unspecified site: Secondary | ICD-10-CM | POA: Insufficient documentation

## 2018-06-16 DIAGNOSIS — Z8042 Family history of malignant neoplasm of prostate: Secondary | ICD-10-CM | POA: Insufficient documentation

## 2018-06-16 DIAGNOSIS — Z9049 Acquired absence of other specified parts of digestive tract: Secondary | ICD-10-CM | POA: Diagnosis not present

## 2018-06-16 DIAGNOSIS — Z882 Allergy status to sulfonamides status: Secondary | ICD-10-CM | POA: Insufficient documentation

## 2018-06-16 DIAGNOSIS — Z79899 Other long term (current) drug therapy: Secondary | ICD-10-CM | POA: Insufficient documentation

## 2018-06-16 DIAGNOSIS — C8293 Follicular lymphoma, unspecified, intra-abdominal lymph nodes: Secondary | ICD-10-CM | POA: Diagnosis not present

## 2018-06-16 DIAGNOSIS — Z87891 Personal history of nicotine dependence: Secondary | ICD-10-CM | POA: Diagnosis not present

## 2018-06-16 DIAGNOSIS — Z888 Allergy status to other drugs, medicaments and biological substances status: Secondary | ICD-10-CM | POA: Diagnosis not present

## 2018-06-16 DIAGNOSIS — Z881 Allergy status to other antibiotic agents status: Secondary | ICD-10-CM | POA: Insufficient documentation

## 2018-06-16 HISTORY — PX: FINE NEEDLE ASPIRATION BIOPSY: CATH118315

## 2018-06-16 LAB — CBC
HCT: 43.8 % (ref 36.0–46.0)
Hemoglobin: 14.4 g/dL (ref 12.0–15.0)
MCH: 30 pg (ref 26.0–34.0)
MCHC: 32.9 g/dL (ref 30.0–36.0)
MCV: 91.3 fL (ref 80.0–100.0)
Platelets: 235 10*3/uL (ref 150–400)
RBC: 4.8 MIL/uL (ref 3.87–5.11)
RDW: 13 % (ref 11.5–15.5)
WBC: 5.7 10*3/uL (ref 4.0–10.5)
nRBC: 0 % (ref 0.0–0.2)

## 2018-06-16 LAB — PROTIME-INR
INR: 0.94
Prothrombin Time: 12.5 seconds (ref 11.4–15.2)

## 2018-06-16 MED ORDER — LIDOCAINE HCL 1 % IJ SOLN
INTRAMUSCULAR | Status: AC
  Filled 2018-06-16: qty 20

## 2018-06-16 MED ORDER — FENTANYL CITRATE (PF) 100 MCG/2ML IJ SOLN
INTRAMUSCULAR | Status: AC
  Filled 2018-06-16: qty 2

## 2018-06-16 MED ORDER — MIDAZOLAM HCL 2 MG/2ML IJ SOLN
INTRAMUSCULAR | Status: AC
  Filled 2018-06-16: qty 2

## 2018-06-16 MED ORDER — SODIUM CHLORIDE 0.9 % IV SOLN: INTRAVENOUS | Status: DC

## 2018-06-16 MED ORDER — FENTANYL CITRATE (PF) 100 MCG/2ML IJ SOLN
INTRAMUSCULAR | Status: AC | PRN
  Administered 2018-06-16: 50 ug via INTRAVENOUS

## 2018-06-16 MED ORDER — GELATIN ABSORBABLE 12-7 MM EX MISC
CUTANEOUS | Status: AC
  Filled 2018-06-16: qty 1

## 2018-06-16 MED ORDER — MIDAZOLAM HCL 2 MG/2ML IJ SOLN
INTRAMUSCULAR | Status: AC | PRN
  Administered 2018-06-16: 1 mg via INTRAVENOUS

## 2018-06-16 NOTE — Progress Notes (Signed)
Pt NPO til 1 pm. States she has no pain. Site is clean, dry, intact, no swelling. Per Dr Pascal Lux, pt may now eat and drink.

## 2018-06-16 NOTE — H&P (Signed)
Chief Complaint: Patient was seen in consultation today for left mesenteric mass biopsy at the request of Alfredia Ferguson  Referring Physician(s): Dr Elnora Morrison  Supervising Physician: Corrie Mckusick  Patient Status: [facility] - Out-pt  History of Present Illness: [name] [name] is a 78 y.o. female   LUQ pain for weeks Cannot lay on left Some dysphagia Hx NHL  CT 06/05/18:  IMPRESSION: 2.4 x 3.6 cm irregular soft tissue mass in the left mid abdominal mesentery, adjacent to loops of small bowel. Differential considerations include mesenteric carcinoid versus lymphoma.  Request made for biopsy of mass  Past Medical History:  Diagnosis Date  . Anal sphincter incompetence 12/31/2015   Manometry 12/2015  . Arthritis   . Brady-tachy syndrome ([place])   . Chest pain 10/16/13   normal coronary arteries on cath and normal EF  . Colitis, ischemic ([place])   . Colon polyp 07/29/1993   with focal adenomatous changes  . Diverticulitis   . Diverticulosis of colon   . Dyspepsia   . Dyssynergic defecation 12/31/2015   Anorectal mano 12/2015, also has rectal hypersensitivity  . Fatty liver   . GERD (gastroesophageal reflux disease)   . Heart murmur   . HLD (hyperlipidemia)   . Hypertension   . IBS (irritable bowel syndrome)   . Incontinence, feces   . MVP (mitral valve prolapse)   . nhl dx'd 07/1999   chemo comp 2001; rituxin comp 2005  . Osteopenia   . Palpitation   . Rectocele   . Rectocele   . Vasovagal syncope     Past Surgical History:  Procedure Laterality Date  . ABDOMINAL HYSTERECTOMY    . ABDOMINAL SURGERY  2001    for non hodgkins lymphoma; lymphoma in mesentary  . ANAL RECTAL MANOMETRY N/A 12/24/2015   Procedure: ANO RECTAL MANOMETRY;  Surgeon: Gatha Mayer, MD;  Location: [facility] ENDOSCOPY;  Service: Endoscopy;  Laterality: N/A;  . BLADDER SUSPENSION    . BUNIONECTOMY    . CHOLECYSTECTOMY  2003  . COLONOSCOPY  7/200/, 11/2007   diverticulosis  . LEFT HEART  CATHETERIZATION WITH CORONARY ANGIOGRAM N/A 10/16/2013   Procedure: LEFT HEART CATHETERIZATION WITH CORONARY ANGIOGRAM;  Surgeon: Peter M Martinique, MD;  Location[facility] CATH LAB;  Service: Cardiovascular;  Laterality: N/A;  . pelvic prolapse repair    . UPPER GASTROINTESTINAL ENDOSCOPY  10/2005   hiatal hernia    Allergies: Other; Sulfa antibiotics; Sulfonamide derivatives; Glycopyrrolate; Latex; Methscopolamine; Ace inhibitors; Codeine; Iodine; and Iohexol  Medications: Prior to Admission medications   Medication Sig Start Date End Date Taking? Authorizing Provider  ALPRAZolam (XANAX) 0.25 MG tablet Take 1 tablet (0.25 mg total) by mouth daily as needed for anxiety. 02/28/18  Yes Cheyenne Adas, NP  aspirin EC 81 MG tablet Take 81 mg by mouth daily.   Yes [provider]  atenolol (TENORMIN) 25 MG tablet Take 1.5 tablets (37.5 mg total) by mouth daily. TAKE ONE (1) TABLET BY MOUTH EVERY DAY Patient taking differently: Take 37.5 mg by mouth daily.  04/04/18  Yes Imogene Burn, PA-C  clobetasol ointment (TEMOVATE) 3.53 % Apply 1 application topically daily as needed (dermatitis).  05/28/14  Yes [provider]  folic acid (FOLVITE) 1 MG tablet Take 1 mg by mouth daily.  12/23/16  Yes [provider]  pantoprazole (PROTONIX) 40 MG tablet TAKE ONE (1) TABLET BY MOUTH EVERY DAY BEFORE BREAKFAST Patient taking differently: Take 40 mg by mouth daily.  07/22/17  Yes Gatha Mayer,  MD  Polyvinyl Alcohol-Povidone (REFRESH OP) Apply 1 drop to eye 3 (three) times daily.   Yes [provider]  Probiotic Product (ALIGN PO) Take 1 capsule by mouth daily. Alternates between [place] per PCP orders every few months    Yes [provider]  TURMERIC PO Take 1 capsule by mouth daily.    Yes [provider]  Wheat Dextrin (BENEFIBER) POWD Take 1 Dose by mouth daily.   Yes [provider]  fexofenadine (ALLEGRA) 180 MG tablet Take  180 mg by mouth daily as needed for allergies.     [provider]     Family History  Problem Relation Age of Onset  . Colon cancer Father 30  . Prostate cancer Brother   . Colon polyps Brother   . Colon polyps Brother   . Stomach cancer Maternal Uncle   . Stomach cancer Maternal Grandfather   . Esophageal cancer Neg Hx   . Pancreatic cancer Neg Hx   . Liver disease Neg Hx   . Breast cancer Neg Hx     Social History   Socioeconomic History  . Marital status: Widowed    Spouse name: Not on file  . Number of children: 1  . Years of education: Not on file  . Highest education level: Not on file  Occupational History  . 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 Financial resource strain: Not on file  . Food insecurity:    Worry: Not on file    Inability: Not on file  . Transportation needs:    Medical: Not on file    Non-medical: Not on file  Tobacco Use  . Smoking status: Former Smoker    Packs/day: 0.33  . Smokeless tobacco: Never Used  . Tobacco comment: quit 1976  Substance and Sexual Activity  . Alcohol use: Yes    Alcohol/week: 0.0 standard drinks    Comment: very rare  . Drug use: No  . Sexual activity: Yes    Birth control/protection: Surgical  Lifestyle  . Physical activity:    Days per week: Not on file    Minutes per session: Not on file  . Stress: Not on file  Relationships  . Social connections:    Talks on phone: Not on file    Gets together: Not on file    Attends religious service: Not on file    Active member of club or organization: Not on file    Attends meetings of clubs or organizations: Not on file    Relationship status: Not on file  Other Topics Concern  . Not on file  Social History Narrative   Retired, widowed, 1 child   Rare EtOH, former smoker none now   No drugs    Review of Systems: A 12 point ROS discussed and pertinent positives are indicated in the HPI above.  All other systems  are negative.  Review of Systems  Constitutional: Positive for activity change, appetite change and fatigue. Negative for fever.  Respiratory: Negative for cough and shortness of breath.   Gastrointestinal: Positive for abdominal pain.  Musculoskeletal: Negative for back pain.  Neurological: Positive for weakness.  Psychiatric/Behavioral: Negative for behavioral problems and confusion.    Vital Signs: BP (!) 142/63   Pulse 86   Temp 97.6 F (36.4 C) (Oral)   Resp 18   Ht 5\' 2"  (1.575 m)   Wt 125 lb (56.7 kg)  SpO2 99%   BMI 22.86 kg/m   Physical Exam Vitals signs reviewed.  Cardiovascular:     Rate and Rhythm: Normal rate and regular rhythm.     Heart sounds: Normal heart sounds.  Pulmonary:     Effort: Pulmonary effort is normal.     Breath sounds: Normal breath sounds.  Abdominal:     General: Bowel sounds are normal.     Tenderness: There is abdominal tenderness.  Musculoskeletal: Normal range of motion.  Skin:    General: Skin is warm and dry.  Neurological:     Mental Status: She is alert and oriented to person, place, and time.  Psychiatric:        Behavior: Behavior normal.        Thought Content: Thought content normal.        Judgment: Judgment normal.     Imaging: Ct Abdomen Pelvis W Contrast  Result Date: 06/05/2018 CLINICAL DATA:  Left upper quadrant abdominal pain, dysphagia. History of non-Hodgkin's lymphoma. EXAM: CT ABDOMEN AND PELVIS WITH CONTRAST TECHNIQUE: Multidetector CT imaging of the abdomen and pelvis was performed using the standard protocol following bolus administration of intravenous contrast. CONTRAST:  141mL ISOVUE-300 IOPAMIDOL (ISOVUE-300) INJECTION 61% COMPARISON:  09/29/2016. FINDINGS: Lower chest: Lung bases are clear. Hepatobiliary: Liver is within normal limits. Status post cholecystectomy. No intrahepatic or extrahepatic ductal dilatation. Pancreas: Within normal limits. Spleen: Within normal limits. Adrenals/Urinary Tract:  Adrenal glands are within normal limits. Mild cortical scarring/parenchymal lobulation involving the kidneys bilaterally. Left renal sinus cysts. No hydronephrosis. Bladder is thick-walled although underdistended. Stomach/Bowel: Stomach is notable for a tiny hiatal hernia. No evidence of bowel obstruction. Normal appendix (series 2/image 67). Sigmoid diverticulosis, without evidence of diverticulitis. Vascular/Lymphatic: No evidence of abdominal aortic aneurysm. Atherosclerotic calcifications of the abdominal aorta and branch vessels. 2.4 x 3.6 cm irregular/spiculated soft tissue mass in the left mid abdominal mesentery (series 2/image 49), adjacent to loops of small bowel, with adjacent surgical clips. This previously measured 1.4 x 2.1 cm. The appearance currently suggests mesenteric carcinoid, although lymphoma is also possible given the clinical history. Otherwise, no suspicious abdominopelvic lymphadenopathy. Reproductive: Status post hysterectomy. No adnexal masses. Other: No abdominopelvic ascites. Musculoskeletal: Mild degenerative changes of the lumbar spine. IMPRESSION: 2.4 x 3.6 cm irregular soft tissue mass in the left mid abdominal mesentery, adjacent to loops of small bowel. Differential considerations include mesenteric carcinoid versus lymphoma. Otherwise, no suspicious abdominopelvic lymphadenopathy. Spleen is normal in size. Electronically Signed   By: Julian Hy M.D.   On: 06/05/2018 13:02    Labs:  CBC: Recent Labs    09/08/17 1436 02/11/18 0827 05/29/18 1129 06/16/18 0922  WBC 7.9 5.8 8.1 5.7  HGB 13.2 14.7 14.8 14.4  HCT 38.4 43.6 43.8 43.8  PLT 274 276 255.0 235    COAGS: Recent Labs    06/16/18 0922  INR 0.94    BMP: Recent Labs    09/08/17 1436 02/11/18 0827 03/28/18 1153 05/29/18 1129  NA 142 144  --  140  K 5.0 4.7  --  4.5  CL 103 106  --  104  CO2 25 25  --  30  GLUCOSE 126* 102*  --  96  BUN 15 15  --  16  CALCIUM 10.8* 10.3  --  10.9*    CREATININE 0.79 0.82 0.72 0.78  GFRNONAA 73 69 >60  --   GFRAA 84 80 >60  --     LIVER FUNCTION TESTS: Recent Labs  09/08/17 1436 02/11/18 0827  BILITOT <0.2 0.3  AST 34 33  ALT 39* 36*  ALKPHOS 76 63  PROT 6.1 6.3  ALBUMIN 4.4 4.6    TUMOR MARKERS: No results for input(s): AFPTM, CEA, CA199, CHROMGRNA in the last 8760 hours.  Assessment and Plan:  Hx NHL New left sided pain for weeks Worsening CT reveals mesenteric mass Scheduled now for biopsy of same  Risks and benefits of left mesenteric mass bx was discussed with the patient and/or patient's family including, but not limited to bleeding, infection, damage to adjacent structures or low yield requiring additional tests.  All of the questions were answered and there is agreement to proceed. Consent signed and in chart.   Thank you for this interesting consult.  I greatly enjoyed meeting [name] [name] and look forward to participating in their care.  A copy of this report was sent to the requesting provider on this date.  Electronically Signed: Lavonia Drafts, PA-C 06/16/2018, 10:01 AM   I spent a total of  30 Minutes   in face to face in clinical consultation, greater than 50% of which was counseling/coordinating care for mesenteric mass biopsy

## 2018-06-16 NOTE — Discharge Instructions (Addendum)
Needle Biopsy, Care After This sheet gives you information about how to care for yourself after your procedure. Your health care provider may also give you more specific instructions. If you have problems or questions, contact your health care provider. What can I expect after the procedure? After the procedure, it is common to have soreness, bruising, or mild pain at the puncture site. This should go away in a few days. Follow these instructions at home: Needle insertion site care   Wash your hands with soap and water before you change your bandage (dressing). If you cannot use soap and water, use hand sanitizer.  Follow instructions from your health care provider about how to take care of your puncture site. This includes: ? When and how to change your dressing. ? When to remove your dressing. Remove bandaid in 24 hours. You may then shower.  Check your puncture site every day for signs of infection. Check for: ? Redness, swelling, or pain. ? Fluid or blood. ? Pus or a bad smell. ? Warmth. General instructions  Return to your normal activities as told by your health care provider. Ask your health care provider what activities are safe for you.  Do not take baths, swim, or use a hot tub until your health care provider approves. Ask your health care provider if you may take showers. You may only be allowed to take sponge baths.  Take over-the-counter and prescription medicines only as told by your health care provider.  Keep all follow-up visits as told by your health care provider. This is important. Contact a health care provider if:  You have a fever.  You have redness, swelling, or pain at the puncture site that lasts longer than a few days.  You have fluid, blood, or pus coming from your puncture site.  Your puncture site feels warm to the touch. Get help right away if:  You have severe bleeding from the puncture site. Summary  After the procedure, it is common to have  soreness, bruising, or mild pain at the puncture site. This should go away in a few days.  Check your puncture site every day for signs of infection, such as redness, swelling, or pain.  Get help right away if you have severe bleeding from your puncture site. This information is not intended to replace advice given to you by your health care provider. Make sure you discuss any questions you have with your health care provider. Document Released: 09/03/2014 Document Revised: 05/02/2017 Document Reviewed: 05/02/2017 Elsevier Interactive Patient Education  2019 [employer]. Moderate Conscious Sedation, Adult, Care After These instructions provide you with information about caring for yourself after your procedure. Your health care provider may also give you more specific instructions. Your treatment has been planned according to current medical practices, but problems sometimes occur. Call your health care provider if you have any problems or questions after your procedure. What can I expect after the procedure? After your procedure, it is common:  To feel sleepy for several hours.  To feel clumsy and have poor balance for several hours.  To have poor judgment for several hours.  To vomit if you eat too soon. Follow these instructions at home: For at least 24 hours after the procedure:   Do not: ? Participate in activities where you could fall or become injured. ? Drive. ? Use heavy machinery. ? Drink alcohol. ? Take sleeping pills or medicines that cause drowsiness. ? Make important decisions or sign legal documents. ? Take  care of children on your own.  Rest. Eating and drinking  Follow the diet recommended by your health care provider.  If you vomit: ? Drink water, juice, or soup when you can drink without vomiting. ? Make sure you have little or no nausea before eating solid foods. General instructions  Have a responsible adult stay with you until you are awake and  alert.  Take over-the-counter and prescription medicines only as told by your health care provider.  If you smoke, do not smoke without supervision.  Keep all follow-up visits as told by your health care provider. This is important. Contact a health care provider if:  You keep feeling nauseous or you keep vomiting.  You feel light-headed.  You develop a rash.  You have a fever. Get help right away if:  You have trouble breathing. This information is not intended to replace advice given to you by your health care provider. Make sure you discuss any questions you have with your health care provider. Document Released: 02/07/2013 Document Revised: 09/22/2015 Document Reviewed: 08/09/2015 Elsevier Interactive Patient Education  2019 [employer].

## 2018-06-16 NOTE — Procedures (Signed)
Pre procedural Dx: Mesenteric mass Bx  Post procedural Dx: Same  Technically successful CT guided biopsy of indeterminate mesenteric mass   EBL: None.  Complications: None immediate.   Ronny Bacon, MD Pager #: [phone]

## 2018-06-19 ENCOUNTER — Ambulatory Visit (AMBULATORY_SURGERY_CENTER): Payer: Medicare Other | Admitting: Internal Medicine

## 2018-06-19 ENCOUNTER — Encounter: Payer: Self-pay | Admitting: Cardiology

## 2018-06-19 VITALS — BP 149/77 | HR 71 | Temp 96.2°F | Resp 22 | Ht 62.75 in | Wt 134.0 lb

## 2018-06-19 DIAGNOSIS — K219 Gastro-esophageal reflux disease without esophagitis: Secondary | ICD-10-CM | POA: Diagnosis not present

## 2018-06-19 DIAGNOSIS — R1012 Left upper quadrant pain: Secondary | ICD-10-CM

## 2018-06-19 DIAGNOSIS — R1319 Other dysphagia: Secondary | ICD-10-CM

## 2018-06-19 DIAGNOSIS — R1310 Dysphagia, unspecified: Secondary | ICD-10-CM

## 2018-06-19 DIAGNOSIS — K222 Esophageal obstruction: Secondary | ICD-10-CM

## 2018-06-19 DIAGNOSIS — K449 Diaphragmatic hernia without obstruction or gangrene: Secondary | ICD-10-CM

## 2018-06-19 DIAGNOSIS — I739 Peripheral vascular disease, unspecified: Secondary | ICD-10-CM | POA: Diagnosis not present

## 2018-06-19 MED ORDER — SODIUM CHLORIDE 0.9 % IV SOLN: 500.0000 mL | Freq: Once | INTRAVENOUS | Status: DC

## 2018-06-19 NOTE — Progress Notes (Signed)
Report given to PACU, vss 

## 2018-06-19 NOTE — Progress Notes (Signed)
Called to room to assist during endoscopic procedure.  Patient ID and intended procedure confirmed with present staff. Received instructions for my participation in the procedure from the performing physician.  

## 2018-06-19 NOTE — Op Note (Signed)
[facility] Patient Name: [name] [name] Procedure Date: 06/19/2018 4:26 PM MRN: [id number] Endoscopist: Gatha Mayer , MD Age: 78 Referring MD:  Date of Birth: 1940-09-25 Gender: Female Account #: [id number] Procedure:                Upper GI endoscopy Indications:              Dysphagia Medicines:                Propofol per Anesthesia, Monitored Anesthesia Care Procedure:                Pre-Anesthesia Assessment:                           - Prior to the procedure, a History and Physical                            was performed, and patient medications and                            allergies were reviewed. The patient's tolerance of                            previous anesthesia was also reviewed. The risks                            and benefits of the procedure and the sedation                            options and risks were discussed with the patient.                            All questions were answered, and informed consent                            was obtained. Prior Anticoagulants: The patient has                            taken no previous anticoagulant or antiplatelet                            agents. ASA Grade Assessment: II - A patient with                            mild systemic disease. After reviewing the risks                            and benefits, the patient was deemed in                            satisfactory condition to undergo the procedure.                           After obtaining informed consent, the endoscope was  passed under direct vision. Throughout the                            procedure, the patient's blood pressure, pulse, and                            oxygen saturations were monitored continuously. The                            Endoscope was introduced through the mouth, and                            advanced to the second part of duodenum. The upper                            GI endoscopy was  accomplished without difficulty.                            The patient tolerated the procedure well. Scope In: Scope Out: Findings:                 One benign-appearing, intrinsic moderate stenosis                            was found at the gastroesophageal junction. The                            stenosis was traversed. A TTS dilator was passed                            through the scope. Dilation with an 18-19-20 mm                            balloon dilator was performed to 18 mm. The                            dilation site was examined and showed moderate                            mucosal disruption. Estimated blood loss was                            minimal.                           A small hiatal hernia was present.                           The exam was otherwise without abnormality.                           The cardia and gastric fundus were normal on                            retroflexion. Complications:  No immediate complications. Estimated Blood Loss:     Estimated blood loss was minimal. Impression:               - Benign-appearing esophageal stenosis. Dilated.                           - Small hiatal hernia.                           - The examination was otherwise normal.                           - No specimens collected. Recommendation:           - Patient has a contact number available for                            emergencies. The signs and symptoms of potential                            delayed complications were discussed with the                            patient. Return to normal activities tomorrow.                            Written discharge instructions were provided to the                            patient.                           - Clear liquids x 1 hour then soft foods rest of                            day. Start prior diet tomorrow.                           - Continue present medications. Gatha Mayer, MD 06/19/2018 4:46:44  PM This report has been signed electronically.

## 2018-06-19 NOTE — Patient Instructions (Addendum)
There was a stricture that I dilated. I think you will swallow better now.  We will be in touch about the biopsy you had Friday when we see the results.  I appreciate the opportunity to care for you. Gatha Mayer, MD, [facility]  Small hiatal hernia.  Your esophagus was dilated today SO CLEAR LIQUIDS FOR ONE HOUR THEN SOFT FOODS FOR THE REST OF TODAY!  RESUME REGULAR DIET TOMORROW. CONTINUE PRESENT MEDICATIONS.  YOU HAD AN ENDOSCOPIC PROCEDURE TODAY AT [facility]:   Refer to the procedure report that was given to you for any specific questions about what was found during the examination.  If the procedure report does not answer your questions, please call your gastroenterologist to clarify.  If you requested that your care partner not be given the details of your procedure findings, then the procedure report has been included in a sealed envelope for you to review at your convenience later.  YOU SHOULD EXPECT: Some feelings of bloating in the abdomen. Passage of more gas than usual.  Walking can help get rid of the air that was put into your GI tract during the procedure and reduce the bloating. If you had a lower endoscopy (such as a colonoscopy or flexible sigmoidoscopy) you may notice spotting of blood in your stool or on the toilet paper. If you underwent a bowel prep for your procedure, you may not have a normal bowel movement for a few days.  Please Note:  You might notice some irritation and congestion in your nose or some drainage.  This is from the oxygen used during your procedure.  There is no need for concern and it should clear up in a day or so.  SYMPTOMS TO REPORT IMMEDIATELY:     Following upper endoscopy (EGD)  Vomiting of blood or coffee ground material  New chest pain or pain under the shoulder blades  Painful or persistently difficult swallowing  New shortness of breath  Fever of 100F or higher  Black, tarry-looking stools  For urgent or  emergent issues, a gastroenterologist can be reached at any hour by calling [phone].   DIET:  SEE NOTE ABOVE AND POST DILATION DIET (SEPARATE SHEET)  Drink plenty of fluids but you should avoid alcoholic beverages for 24 hours.  ACTIVITY:  You should plan to take it easy for the rest of today and you should NOT DRIVE or use heavy machinery until tomorrow (because of the sedation medicines used during the test).    FOLLOW UP: Our staff will call the number listed on your records the next business day following your procedure to check on you and address any questions or concerns that you may have regarding the information given to you following your procedure. If we do not reach you, we will leave a message.  However, if you are feeling well and you are not experiencing any problems, there is no need to return our call.  We will assume that you have returned to your regular daily activities without incident.  If any biopsies were taken you will be contacted by phone or by letter within the next 1-3 weeks.  Please call us at [phone] if you have not heard about the biopsies in 3 weeks.    SIGNATURES/CONFIDENTIALITY: You and/or your care partner have signed paperwork which will be entered into your electronic medical record.  These signatures attest to the fact that that the information above on your After Visit Summary has  been reviewed and is understood.  Full responsibility of the confidentiality of this discharge information lies with you and/or your care-partner.

## 2018-06-20 ENCOUNTER — Telehealth: Payer: Self-pay

## 2018-06-20 ENCOUNTER — Telehealth: Payer: Self-pay | Admitting: *Deleted

## 2018-06-20 ENCOUNTER — Ambulatory Visit: Payer: Medicare Other | Admitting: Family Medicine

## 2018-06-20 NOTE — Telephone Encounter (Signed)
  Follow up Call-  Call back number 06/19/2018 11/24/2017 11/24/2017  Post procedure Call Back phone  # [phone] -  Permission to leave phone message Yes (No Data) Yes  comments - pt will be walking -  Some recent data might be hidden     Patient questions:  Do you have a fever, pain , or abdominal swelling? No. Pain Score  0 *  Have you tolerated food without any problems? Yes.    Have you been able to return to your normal activities? Yes.    Do you have any questions about your discharge instructions: Diet   No. Medications  No. Follow up visit  No.  Do you have questions or concerns about your Care? No.  Actions: * If pain score is 4 or above: No action needed, pain <4.

## 2018-06-20 NOTE — Telephone Encounter (Signed)
Called [phone] and left a messaged we tried to reach pt for a follow up call. maw

## 2018-06-21 ENCOUNTER — Encounter: Payer: Self-pay | Admitting: Family Medicine

## 2018-06-21 ENCOUNTER — Ambulatory Visit (INDEPENDENT_AMBULATORY_CARE_PROVIDER_SITE_OTHER): Payer: Medicare Other | Admitting: Family Medicine

## 2018-06-21 VITALS — BP 120/74 | Temp 99.2°F | Ht 63.0 in | Wt 130.0 lb

## 2018-06-21 DIAGNOSIS — R21 Rash and other nonspecific skin eruption: Secondary | ICD-10-CM | POA: Diagnosis not present

## 2018-06-21 MED ORDER — PREDNISONE 10 MG PO TABS: ORAL_TABLET | ORAL | 0 refills | Status: DC

## 2018-06-21 MED ORDER — BETAMETHASONE DIPROPIONATE AUG 0.05 % EX CREA: TOPICAL_CREAM | Freq: Two times a day (BID) | CUTANEOUS | 0 refills | Status: DC

## 2018-06-21 NOTE — Progress Notes (Signed)
   Subjective:    Patient ID: [name] [name], female    DOB: 17-May-1940, 78 y.o.   MRN: [id number]  HPI Patient is here today with a flare up of her eczema.Itches.  She states this has been on going for the last week or so.  She has a dermatologist,but he could not see her for several weeks.  She has been using Triamcinolone cream on it. Actually used the ointment   Pt had work up of swelling recently and biopsy done, resuts pending      Review of Systems No headache, no major weight loss or weight gain, no chest pain no back pain abdominal pain no change in bowel habits complete ROS otherwise negative     Objective:   Physical Exam   Alert vitals stable, NAD. Blood pressure good on repeat. HEENT normal. Lungs clear. Heart regular rate and rhythm. Patchy hyperemic maculopapular rash anterior legs.  Right greater than     Assessment & Plan:  Impression flare of chronic eczema.  Despite utilizing triamcinolone.  Increase to Diprolene twice daily for next week or 2 along with oral prednisone taper.

## 2018-06-22 DIAGNOSIS — G245 Blepharospasm: Secondary | ICD-10-CM | POA: Diagnosis not present

## 2018-06-26 NOTE — Progress Notes (Signed)
I have communicated with Dr. Benay Spice and called Ms. Luster and his office will be contacting her for an appointment.

## 2018-06-27 ENCOUNTER — Telehealth: Payer: Self-pay | Admitting: *Deleted

## 2018-06-27 DIAGNOSIS — L570 Actinic keratosis: Secondary | ICD-10-CM | POA: Diagnosis not present

## 2018-06-27 DIAGNOSIS — L309 Dermatitis, unspecified: Secondary | ICD-10-CM | POA: Diagnosis not present

## 2018-06-27 DIAGNOSIS — L308 Other specified dermatitis: Secondary | ICD-10-CM | POA: Diagnosis not present

## 2018-06-27 DIAGNOSIS — Z23 Encounter for immunization: Secondary | ICD-10-CM | POA: Diagnosis not present

## 2018-06-27 NOTE — Telephone Encounter (Addendum)
Per Dr. Benay Spice: New patient appointment on 07/07/18 at 1:45. Dx: Lymphoma. Called patient and left VM with appointment date/time with request to return call to confirm. Notified new patient coordinator of referral from Dr. Carlean Purl. Patient called back to confirm the above appointment is good for her.

## 2018-07-07 ENCOUNTER — Inpatient Hospital Stay: Payer: Medicare Other | Attending: Oncology | Admitting: Oncology

## 2018-07-07 ENCOUNTER — Telehealth: Payer: Self-pay | Admitting: Oncology

## 2018-07-07 ENCOUNTER — Inpatient Hospital Stay: Payer: Medicare Other

## 2018-07-07 ENCOUNTER — Other Ambulatory Visit: Payer: Self-pay

## 2018-07-07 VITALS — BP 171/66 | HR 67 | Temp 97.8°F | Resp 19 | Ht 63.0 in | Wt 129.3 lb

## 2018-07-07 DIAGNOSIS — Y92538 Other ambulatory health services establishments as the place of occurrence of the external cause: Secondary | ICD-10-CM | POA: Insufficient documentation

## 2018-07-07 DIAGNOSIS — R531 Weakness: Secondary | ICD-10-CM | POA: Diagnosis not present

## 2018-07-07 DIAGNOSIS — R6883 Chills (without fever): Secondary | ICD-10-CM | POA: Insufficient documentation

## 2018-07-07 DIAGNOSIS — X58XXXA Exposure to other specified factors, initial encounter: Secondary | ICD-10-CM | POA: Diagnosis not present

## 2018-07-07 DIAGNOSIS — M1990 Unspecified osteoarthritis, unspecified site: Secondary | ICD-10-CM | POA: Diagnosis not present

## 2018-07-07 DIAGNOSIS — E785 Hyperlipidemia, unspecified: Secondary | ICD-10-CM | POA: Diagnosis not present

## 2018-07-07 DIAGNOSIS — K5730 Diverticulosis of large intestine without perforation or abscess without bleeding: Secondary | ICD-10-CM

## 2018-07-07 DIAGNOSIS — M8580 Other specified disorders of bone density and structure, unspecified site: Secondary | ICD-10-CM

## 2018-07-07 DIAGNOSIS — R51 Headache: Secondary | ICD-10-CM

## 2018-07-07 DIAGNOSIS — Z86010 Personal history of colonic polyps: Secondary | ICD-10-CM | POA: Diagnosis not present

## 2018-07-07 DIAGNOSIS — C8213 Follicular lymphoma grade II, intra-abdominal lymph nodes: Secondary | ICD-10-CM | POA: Diagnosis not present

## 2018-07-07 DIAGNOSIS — Z79899 Other long term (current) drug therapy: Secondary | ICD-10-CM | POA: Diagnosis not present

## 2018-07-07 DIAGNOSIS — I10 Essential (primary) hypertension: Secondary | ICD-10-CM | POA: Diagnosis not present

## 2018-07-07 DIAGNOSIS — K219 Gastro-esophageal reflux disease without esophagitis: Secondary | ICD-10-CM

## 2018-07-07 DIAGNOSIS — Z792 Long term (current) use of antibiotics: Secondary | ICD-10-CM

## 2018-07-07 DIAGNOSIS — M79602 Pain in left arm: Secondary | ICD-10-CM | POA: Diagnosis not present

## 2018-07-07 DIAGNOSIS — Z888 Allergy status to other drugs, medicaments and biological substances status: Secondary | ICD-10-CM | POA: Diagnosis not present

## 2018-07-07 DIAGNOSIS — Z7189 Other specified counseling: Secondary | ICD-10-CM | POA: Insufficient documentation

## 2018-07-07 DIAGNOSIS — T8090XA Unspecified complication following infusion and therapeutic injection, initial encounter: Secondary | ICD-10-CM | POA: Diagnosis not present

## 2018-07-07 DIAGNOSIS — R630 Anorexia: Secondary | ICD-10-CM | POA: Diagnosis not present

## 2018-07-07 DIAGNOSIS — L0889 Other specified local infections of the skin and subcutaneous tissue: Secondary | ICD-10-CM

## 2018-07-07 DIAGNOSIS — C8203 Follicular lymphoma grade I, intra-abdominal lymph nodes: Secondary | ICD-10-CM

## 2018-07-07 DIAGNOSIS — Z5112 Encounter for antineoplastic immunotherapy: Secondary | ICD-10-CM | POA: Insufficient documentation

## 2018-07-07 DIAGNOSIS — K589 Irritable bowel syndrome without diarrhea: Secondary | ICD-10-CM | POA: Diagnosis not present

## 2018-07-07 DIAGNOSIS — R55 Syncope and collapse: Secondary | ICD-10-CM | POA: Diagnosis not present

## 2018-07-07 DIAGNOSIS — R079 Chest pain, unspecified: Secondary | ICD-10-CM | POA: Insufficient documentation

## 2018-07-07 LAB — CBC WITH DIFFERENTIAL (CANCER CENTER ONLY)
Abs Immature Granulocytes: 0.02 10*3/uL (ref 0.00–0.07)
BASOS ABS: 0.1 10*3/uL (ref 0.0–0.1)
Basophils Relative: 1 %
Eosinophils Absolute: 0.3 10*3/uL (ref 0.0–0.5)
Eosinophils Relative: 4 %
HEMATOCRIT: 43.1 % (ref 36.0–46.0)
Hemoglobin: 13.8 g/dL (ref 12.0–15.0)
IMMATURE GRANULOCYTES: 0 %
Lymphocytes Relative: 27 %
Lymphs Abs: 2.1 10*3/uL (ref 0.7–4.0)
MCH: 29.4 pg (ref 26.0–34.0)
MCHC: 32 g/dL (ref 30.0–36.0)
MCV: 91.7 fL (ref 80.0–100.0)
Monocytes Absolute: 0.9 10*3/uL (ref 0.1–1.0)
Monocytes Relative: 12 %
NEUTROS PCT: 56 %
NRBC: 0 % (ref 0.0–0.2)
Neutro Abs: 4.4 10*3/uL (ref 1.7–7.7)
Platelet Count: 251 10*3/uL (ref 150–400)
RBC: 4.7 MIL/uL (ref 3.87–5.11)
RDW: 13.1 % (ref 11.5–15.5)
WBC Count: 7.8 10*3/uL (ref 4.0–10.5)

## 2018-07-07 LAB — COMPREHENSIVE METABOLIC PANEL
ALT: 20 U/L (ref 0–44)
AST: 21 U/L (ref 15–41)
Albumin: 4.4 g/dL (ref 3.5–5.0)
Alkaline Phosphatase: 60 U/L (ref 38–126)
Anion gap: 8 (ref 5–15)
BUN: 15 mg/dL (ref 8–23)
CO2: 26 mmol/L (ref 22–32)
Calcium: 10.5 mg/dL — ABNORMAL HIGH (ref 8.9–10.3)
Chloride: 105 mmol/L (ref 98–111)
Creatinine, Ser: 0.78 mg/dL (ref 0.44–1.00)
GFR calc Af Amer: >60 mL/min (ref >60)
GFR calc non Af Amer: >60 mL/min (ref >60)
Glucose, Bld: 91 mg/dL (ref 70–99)
Potassium: 4.6 mmol/L (ref 3.5–5.1)
Sodium: 139 mmol/L (ref 135–145)
Total Bilirubin: 0.2 mg/dL — ABNORMAL LOW (ref 0.3–1.2)
Total Protein: 6.8 g/dL (ref 6.5–8.1)

## 2018-07-07 NOTE — Progress Notes (Signed)
[facility] New Patient Consult   Requesting MD: Matisyn Cabeza 78 y.o.  [birth year]    Reason for Consult: Non-Hodgkin's lymphoma   HPI: [name] [name] was diagnosed with low-grade non-Hodgkin's lymphoma in 2001.  [name] [name] was treated with mitoxantrone, fludarabine, and rituximab ending in January 2002.  [name] [name] had disease progression in February 2004 and was treated with single agent rituximab in February 2004 through August 2005 and then followed with observation.  [name] [name] has undergone multiple restaging imaging studies over the past 15 years. [name] [name] was discharged from the oncology clinic in 2017.  CTs on 06/21/2014 showed no evidence of lymphoma. [name] [name] is followed by Dr. Carlean Purl for colonoscopy screening and irritable bowel syndrome.  [name] [name] was seen on 05/29/2018 with a report of left upper quadrant and epigastric discomfort for several months.  A CT of the abdomen pelvis 06/05/2018 found the lung bases to be clear.  The liver and pancreas are normal.  There is sigmoid diverticulosis without evidence of diverticulitis.  2.4 x 3.6 cm irregular soft tissue mass is noted in the left mid abdominal mesentery adjacent to loops of small bowel.  This measured 1.4 x 2.1 cm on a CT 09/29/2016.  The appearance was suggestive of mesenteric carcinoid with lymphoma being in the differential diagnosis.  No other lymphadenopathy.  [name] [name] was referred for a CT biopsy of the mass on 06/16/2018.  The pathology revealed fragments of lymphoid tissue with variably sized atypical lymphoid follicles composed primarily of small angulated lymphoid cells mixed with a scattering of larger lymphoid cells to a lesser extent.  The large cells counted at less than 15 per high-powered field.  Flow cytometry confirmed a monoclonal B-cell population expressing CD20 and CD10.  The KI-67 returned at 10%.  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  [name] [name] continues to have intermittent discomfort in the  left subcostal region that is worse when lying on [name] [name] left side.    Past Medical History:  Diagnosis Date  . Anal sphincter incompetence 12/31/2015   Manometry 12/2015  . Arthritis   . Brady-tachy syndrome ([place])   . Chest pain 10/16/13   normal coronary arteries on cath and normal EF  . Colitis, ischemic ([place])   . Colon polyp 07/29/1993   with focal adenomatous changes  . Diverticulitis   . Diverticulosis of colon   . Dyspepsia   . Dyssynergic defecation 12/31/2015   Anorectal mano 12/2015, also has rectal hypersensitivity  . Fatty liver   . GERD (gastroesophageal reflux disease)   . Heart murmur   . HLD (hyperlipidemia)   . Hypertension   . IBS (irritable bowel syndrome)   . Incontinence, feces   . MVP (mitral valve prolapse)   . nhl dx'd 07/1999   chemo comp 2001; rituxin comp 2005  . Osteopenia   . Palpitation   . Rectocele   . Rectocele   . Vasovagal syncope     .  Non-Hodgkin's lymphoma-follicular, grade 2 lymphoma diagnosed in 2001   .  G1 P1 Past Surgical History:  Procedure Laterality Date  . ABDOMINAL HYSTERECTOMY    . ABDOMINAL SURGERY  2001    for non hodgkins lymphoma; lymphoma in mesentary  . ANAL RECTAL MANOMETRY N/A 12/24/2015   Procedure: ANO RECTAL MANOMETRY;  Surgeon: Gatha Mayer, MD;  Location: [facility];  Service: Endoscopy;  Laterality: N/A;  . BLADDER SUSPENSION    . BUNIONECTOMY    . CHOLECYSTECTOMY  2003  . COLONOSCOPY  7/200/, 11/2007   diverticulosis  . FINE NEEDLE ASPIRATION BIOPSY Left 06/16/2018   left mesentery tissue  . LEFT HEART CATHETERIZATION WITH CORONARY ANGIOGRAM N/A 10/16/2013   Procedure: LEFT HEART CATHETERIZATION WITH CORONARY ANGIOGRAM;  Surgeon: Peter M Martinique, MD;  Location: [facility] C[facility] LAB;  Service: Cardiovascular;  Laterality: N/A;  . pelvic prolapse repair    . UPPER GASTROINTESTINAL ENDOSCOPY  10/2005   hiatal hernia    .  Tonsillectomy  Medications: Reviewed  Allergies:  Allergies  Allergen Reactions  . Other  Anaphylaxis    Other reaction(s): Facial swelling And face swelling And face swelling   . Sulfa Antibiotics Swelling and Anaphylaxis    And face swelling Face swelling   . Sulfonamide Derivatives Swelling    Face swelling   . Glycopyrrolate Other (See Comments)    Unable to urinate Could not urinate after it was given  . Latex Other (See Comments)    Blisters  . Methscopolamine Other (See Comments)    Unable to urinate Unable to urinate  . Ace Inhibitors Hives  . Codeine Nausea Only  . Iodine Hives  . Iohexol Hives     Desc: 50 mg benadryl prior to exam     Family history: [name] [name] father had colon cancer.  A brother had prostate cancer.  A brother had pancreas cancer  Social History:   [name] [name] is a widow.  [name] [name] lives alone in [place].  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  [name] [name] quit smoking cigarettes in [name] [name] 96s, [name] [name] reports rare alcohol use.  [name] [name] was transfused at the time of the hysterectomy.  No risk factor for HIV or hepatitis.  ROS:   Positives include: Intermittent left subcostal discomfort-worse when lying on [name] [name] left side, history of solid dysphasia, recent anorexia [name] [name] relates to "stress ".,  Inflammation at a right lower leg biopsy site-currently taking antibiotics, occasional headaches for the past few months, abdominal fullness after eating  A complete ROS was otherwise negative.  Physical Exam:  Blood pressure (!) 171/66, pulse 67, temperature 97.8 F (36.6 C), temperature source Oral, resp. rate 19, height _0  (1.6 m), weight 129 lb 4.8 oz (58.7 kg), SpO2 100 %.  HEENT: Oropharynx without visible mass, neck without mass Lungs: Clear bilaterally, no respiratory distress Cardiac: Regular rate and rhythm Abdomen: No hepatosplenomegaly, no mass, no apparent ascites, mild tenderness in the left upper abdomen near the costal margin  Vascular: No leg edema Lymph nodes: No cervical, supraclavicular, axillary, or inguinal nodes Neurologic: Alert and oriented, the motor exam  appears intact in the upper and lower extremities bilaterally Skin: No rash Musculoskeletal: Spine tenderness   LAB:  CBC  Lab Results  Component Value Date   WBC 5.7 06/16/2018   HGB 14.4 06/16/2018   HCT 43.8 06/16/2018   MCV 91.3 06/16/2018   PLT 235 06/16/2018   NEUTROABS 4.9 05/29/2018        CMP  Lab Results  Component Value Date   NA 140 05/29/2018   K 4.5 05/29/2018   CL 104 05/29/2018   CO2 30 05/29/2018   GLUCOSE 96 05/29/2018   BUN 16 05/29/2018   CREATININE 0.78 05/29/2018   CALCIUM 10.9 (H) 05/29/2018   PROT 6.3 02/11/2018   ALBUMIN 4.6 02/11/2018   AST 33 02/11/2018   ALT 36 (H) 02/11/2018   ALKPHOS 63 02/11/2018   BILITOT 0.3 02/11/2018   GFRNONAA >60 03/28/2018   GFRAA >60 03/28/2018    Images: CT abdomen/pelvis from 07/2018 and 09/29/2016-images reviewed  with [name] [name] and [name] [name] son   Assessment/Plan:   1. Non-Hodgkin's lymphoma-follicular lymphoma  Mesenteric lymph node biopsy 06/09/2180- follicular center cell lymphoma, grade 2  Treated with mitoxantrone, fludarabine, and rituximab completed in January 2002  Progressive disease in February 2004, treated with single agent rituximab February 2004 through August 2005  CTs 06/21/2014- no evidence of lymphoma  CT abdomen/pelvis 06/05/2018- enlarging left mid abdominal mesenteric mass  CT biopsy of the mesenteric mass 06/16/2018-follicular lymphoma, low-grade, CD20 positive 2. Irritable bowel syndrome 3. History of anal incontinence 4. Osteopenia 5. History of colon polyps   Disposition:   [name] [name] has non-Hodgkin's lymphoma.  The diagnosis dates to 2001.  The clinical history is consistent with low-grade follicular lymphoma.  I discussed the 06/16/2018 biopsy result and treatment options with [name] [name] and [name] [name] son.  We reviewed the CT images.  It is unclear whether the left subcostal discomfort is related to lymphoma, but I suspect it is.  We discussed observation versus resuming  systemic therapy.  [name] [name] understands no therapy will be curative.  [name] [name] not comfortable with observation.  [name] [name] would like to proceed with systemic therapy.  I recommend single agent rituximab.  [name] [name] reports tolerating rituximab well in the past.  We reviewed potential toxicities associated with rituximab including the chance for an allergic reaction, reactivation of hepatitis, atypical infections, and CNS toxicity.  [name] [name] agrees to proceed.  The plan is to begin rituximab during the week of 07/17/2018.  [name] [name] will complete 4 weekly treatments.  We will plan for a restaging CT at a 2-57-monthinterval.  [name] [name] will return to the lab for a CBC, chemistry panel, and LDH today.  An immunotherapy plan was entered today.  GBetsy Coder MD  07/07/2018, 3:55 PM

## 2018-07-07 NOTE — Progress Notes (Signed)
START OFF PATHWAY REGIMEN - Lymphoma and CLL   [id number]:Rituximab (Weekly):   Administer weekly:     Rituximab   **Always confirm dose/schedule in your pharmacy ordering system**  Patient Characteristics: Follicular Lymphoma, Grades 1, 2, and 3A, First Line, Stage III / IV, Symptomatic or Bulky Disease Disease Type: Follicular Lymphoma Disease Type: Not Applicable Disease Type: Not Applicable [place] Stage: Unknown Tumor Grade: 1 Line of Therapy: First Line Disease Characteristics: Symptomatic or Bulky Disease Intent of Therapy: Non-Curative / Palliative Intent, Discussed with Patient

## 2018-07-07 NOTE — Telephone Encounter (Signed)
Gave avs and calendar. Ok per GBS to push treatment out a week further than stated in los.

## 2018-07-08 LAB — HEPATITIS B SURFACE ANTIGEN: Hepatitis B Surface Ag: NEGATIVE

## 2018-07-08 LAB — HEPATITIS B CORE ANTIBODY, TOTAL: Hep B Core Total Ab: NEGATIVE

## 2018-07-10 LAB — LACTATE DEHYDROGENASE: LDH: 203 U/L — ABNORMAL HIGH (ref 98–192)

## 2018-07-11 DIAGNOSIS — T1490XD Injury, unspecified, subsequent encounter: Secondary | ICD-10-CM | POA: Diagnosis not present

## 2018-07-17 ENCOUNTER — Ambulatory Visit (INDEPENDENT_AMBULATORY_CARE_PROVIDER_SITE_OTHER): Payer: Medicare Other | Admitting: Cardiology

## 2018-07-17 ENCOUNTER — Encounter: Payer: Self-pay | Admitting: Cardiology

## 2018-07-17 ENCOUNTER — Other Ambulatory Visit: Payer: Self-pay

## 2018-07-17 VITALS — BP 120/66 | HR 59 | Ht 62.0 in | Wt 130.2 lb

## 2018-07-17 DIAGNOSIS — I341 Nonrheumatic mitral (valve) prolapse: Secondary | ICD-10-CM | POA: Diagnosis not present

## 2018-07-17 DIAGNOSIS — I4729 Other ventricular tachycardia: Secondary | ICD-10-CM

## 2018-07-17 DIAGNOSIS — I472 Ventricular tachycardia: Secondary | ICD-10-CM

## 2018-07-17 DIAGNOSIS — R002 Palpitations: Secondary | ICD-10-CM | POA: Diagnosis not present

## 2018-07-17 NOTE — Progress Notes (Signed)
Cardiology Office Note    Date:  07/17/2018   ID:  [name] [name], DOB August 11, 1940, MRN [id number]  PCP:  [name] Kirschner, MD  Cardiologist: [name] Dawley, MD EPS: None  Reason for visit: 6 months follow-up  History of Present Illness:  [name] [name] is a 78 y.o. female with history of MVP, bradycardia tachycardia syndrome, palpitations, HLD, hypertension, normal coronary arteries and LV function on cath in 10/16/13.  Holter monitor in the past showed 2 episodes of NSVT lasting 14 and 11 cycles.  Cardiac MRI showed normal biventricular function and ruled out infiltrative or inflammatory cardiomyopathy. Repeat echo in March 2017 showed normal biventricular size thickness and function. She was evaluated by EPS and recommended she take beta blocker and watchful waiting. Flecainide would be another medical option in conjunction with her beta blocker if she has bothersome symptoms.    I saw the patient 01/31/2018 at which time she was having some dizziness with her palpitations but it passed quickly.  Palpitations have improved on atenolol.  Did not tolerate metoprolol.  Also was having some lower extremity edema felt secondary to venous stasis.  2D echo 02/10/2018 showed normal LV function with mild diastolic dysfunction and mild MR similar to prior echo.  30-day monitor showed 3 episodes of NSVT.  2 episodes were 5 beat and one episode was 6 beats.  [name] [name] ordered a cardiac MRI which was normal.  Patient comes in today for follow-up.  She continues to have palpitations but has had no further dizziness.  It does not occur every day.  Some days she has a lot of fatigue and wonders if it is related.  non-Hodgkin's lymphoma.  The diagnosis dates to 2001.  The clinical history is consistent with low-grade follicular lymphoma.  I discussed the 06/16/2018 biopsy result and treatment options with [name] [name] and her son.  We reviewed the CT images.  It is unclear whether the left subcostal  discomfort is related to lymphoma, but I suspect it is.  We discussed observation versus resuming systemic therapy.  She understands no therapy will be curative.  07/17/2018 -this is 6 months follow-up, in the interim the patient was diagnosed with relapsing non-Hodgkin lymphoma and is going to be started on rituximab in 4 weeks cycle starting next Monday.  She denies any symptoms of chest pain shortness of breath, no lower extremity edema she has occasional palpitations not associated with shortness of breath or dizziness.   Past Medical History:  Diagnosis Date  . Anal sphincter incompetence 12/31/2015   Manometry 12/2015  . Arthritis   . Brady-tachy syndrome ([name])   . Chest pain 10/16/13   normal coronary arteries on cath and normal EF  . Colitis, ischemic ([place])   . Colon polyp 07/29/1993   with focal adenomatous changes  . Diverticulitis   . Diverticulosis of colon   . Dyspepsia   . Dyssynergic defecation 12/31/2015   Anorectal mano 12/2015, also has rectal hypersensitivity  . Fatty liver   . GERD (gastroesophageal reflux disease)   . Heart murmur   . HLD (hyperlipidemia)   . Hypertension   . IBS (irritable bowel syndrome)   . Incontinence, feces   . MVP (mitral valve prolapse)   . nhl dx'd 07/1999   chemo comp 2001; rituxin comp 2005  . Osteopenia   . Palpitation   . Rectocele   . Rectocele   . Vasovagal syncope     Past Surgical History:  Procedure Laterality Date  .  ABDOMINAL HYSTERECTOMY    . ABDOMINAL SURGERY  2001    for non hodgkins lymphoma; lymphoma in mesentary  . ANAL RECTAL MANOMETRY N/A 12/24/2015   Procedure: ANO RECTAL MANOMETRY;  Surgeon: [name] Mayer, MD;  Location: [facility];  Service: Endoscopy;  Laterality: N/A;  . BLADDER SUSPENSION    . BUNIONECTOMY    . CHOLECYSTECTOMY  2003  . COLONOSCOPY  7/200/, 11/2007   diverticulosis  . FINE NEEDLE ASPIRATION BIOPSY Left 06/16/2018   left mesentery tissue  . LEFT HEART CATHETERIZATION WITH CORONARY  ANGIOGRAM N/A 10/16/2013   Procedure: LEFT HEART CATHETERIZATION WITH CORONARY ANGIOGRAM;  Surgeon: [name] M Martinique, MD;  Location: [facility] CATH LAB;  Service: Cardiovascular;  Laterality: N/A;  . pelvic prolapse repair    . UPPER GASTROINTESTINAL ENDOSCOPY  10/2005   hiatal hernia    Current Medications: Current Meds  Medication Sig  . ALPRAZolam (XANAX) 0.25 MG tablet Take 1 tablet (0.25 mg total) by mouth daily as needed for anxiety.  Marland Kitchen aspirin EC 81 MG tablet Take 81 mg by mouth daily.  Marland Kitchen atenolol (TENORMIN) 25 MG tablet Take 37.5 mg by mouth daily.  . fexofenadine (ALLEGRA) 180 MG tablet Take 180 mg by mouth daily as needed for allergies.   . folic acid (FOLVITE) 1 MG tablet Take 1 mg by mouth daily.   . pantoprazole (PROTONIX) 40 MG tablet Take 40 mg by mouth daily.  . Polyvinyl Alcohol-Povidone (REFRESH OP) Apply 1 drop to eye 3 (three) times daily.  . Probiotic Product (ALIGN PO) Take 1 capsule by mouth daily. Alternates between [place] per PCP orders every few months   . TURMERIC PO Take 1 capsule by mouth daily.   Marland Kitchen UNABLE TO FIND Apply topically.  . Wheat Dextrin (BENEFIBER) POWD Take 1 Dose by mouth daily.     Allergies:   Other; Sulfa antibiotics; Sulfonamide derivatives; Glycopyrrolate; Latex; Methscopolamine; Ace inhibitors; Codeine; Iodine; and Iohexol   Social History   Socioeconomic History  . Marital status: Widowed    Spouse name: Not on file  . Number of children: 1  . Years of education: Not on file  . Highest education level: Not on file  Occupational History  . 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 Financial resource strain: Not on file  . Food insecurity:    Worry: Not on file    Inability: Not on file  . Transportation needs:    Medical: Not on file    Non-medical: Not on file  Tobacco Use  . Smoking status: Former Smoker    Packs/day: 0.33  . Smokeless tobacco: Never Used  . Tobacco comment:  quit 1976  Substance and Sexual Activity  . Alcohol use: Yes    Alcohol/week: 0.0 standard drinks    Comment: very rare  . Drug use: No  . Sexual activity: Yes    Birth control/protection: Surgical  Lifestyle  . Physical activity:    Days per week: Not on file    Minutes per session: Not on file  . Stress: Not on file  Relationships  . Social connections:    Talks on phone: Not on file    Gets together: Not on file    Attends religious service: Not on file    Active member of club or organization: Not on file    Attends meetings of clubs or organizations: Not on file    Relationship status: Not on file  Other Topics Concern  . Not on file  Social History Narrative   Retired, widowed, 1 child   Rare EtOH, former smoker none now   No drugs     Family History:  The patient's family history includes Colon cancer (age of onset: 55) in her father; Colon polyps in her brother and brother; Prostate cancer in her brother; Stomach cancer in her maternal grandfather and maternal uncle.   ROS:   Please see the history of present illness.    ROS All other systems reviewed and are negative.   PHYSICAL EXAM:   VS:  BP 120/66   Pulse (!) 59   Ht 5\' 2"  (1.575 m)   Wt 130 lb 3.2 oz (59.1 kg)   SpO2 96%   BMI 23.81 kg/m   Physical Exam  GEN: Well nourished, well developed, in no acute distress  HEENT: normal  Neck: no JVD, carotid bruits, or masses Cardiac:RRR; no murmurs, rubs, or gallops  Respiratory:  clear to auscultation bilaterally, normal work of breathing GI: soft, nontender, nondistended, + BS Ext: without cyanosis, clubbing, or edema, Good distal pulses bilaterally MS: no deformity or atrophy  Skin: warm and dry, no rash Neuro:  Alert and Oriented x 3, Strength and sensation are intact Psych: euthymic mood, full affect  Wt Readings from Last 3 Encounters:  07/17/18 130 lb 3.2 oz (59.1 kg)  07/07/18 129 lb 4.8 oz (58.7 kg)  06/21/18 130 lb 0.4 oz (59 kg)       Studies/Labs Reviewed:   EKG:  EKG is not ordered today.   Recent Labs: 07/07/2018: ALT 20; BUN 15; Creatinine, Ser 0.78; Hemoglobin 13.8; Platelet Count 251; Potassium 4.6; Sodium 139   Lipid Panel    Component Value Date/Time   CHOL 215 (H) 02/11/2018 0827   TRIG 89 02/11/2018 0827   HDL 53 02/11/2018 0827   CHOLHDL 4.1 02/11/2018 0827   CHOLHDL 4.6 02/06/2013 0806   VLDL 33 02/06/2013 0806   LDLCALC 144 (H) 02/11/2018 0827    Additional studies/ records that were reviewed today include:  2D echo 10/11/2019Study Conclusions   - Left ventricle: The cavity size was normal. Wall thickness was   normal. Systolic function was normal. The estimated ejection   fraction was in the range of 55% to 60%. Wall motion was normal;   there were no regional wall motion abnormalities. Doppler   parameters are consistent with abnormal left ventricular   relaxation (grade 1 diastolic dysfunction). - Mitral valve: There was mild regurgitation.   Impressions:   - Normal LV systolic function; mild diastolic dysfunction; mild MR.  30-day monitor 03/06/2018 sinus bradycardia to sinus tachycardia with 3 episodes of NSVT: 2 5 beats and one 6 beat  Cardiac MRI IMPRESSION: 1. Normal left ventricular size, thickness and systolic function (LVEF =27%). There is paradoxical septal motion. There is no late gadolinium enhancement in the left ventricular myocardium.   2. Normal right ventricular size, thickness and systolic function (LVEF = 63%). There are no regional wall motion abnormalities.   3.  Normal left and right atrial size.   4. Normal size of the aortic root, ascending aorta and pulmonary artery.   5. Anterior mitral valve leaflet prolapse with mitral regurgitation. Mild tricuspid regurgitation.   6.  Normal pericardium.  No pericardial effusion.   Normal cardiac MRI without evidence for inflammatory cardiomyopathy or ARVC.     Electronically Signed   By: [name] [name]   On:  03/28/2018 19:07  ASSESSMENT:    1. NSVT (nonsustained ventricular tachycardia) ([place])   2. MVP (mitral valve prolapse)   3. Palpitations      PLAN:  In order of problems listed above:  NSVT 3 episodes on recent 30-day monitor, to 5 beat runs and 1 6 beat run.  MRI ordered by [name] [name] and was normal.  Normal LV function on echo.  She is currently taking atenolol 37.5 mg that improved her symptoms.  She does not feel any dizziness with bradycardia.  She is going to be started on rituximab that can increase the amount of palpitations.  We will monitor closely.  Normal cardiac MRI.  Non-Hodgkin lymphoma -to be started on rituximab next week, we will follow cardiogram with strain every 3 months, rituximab is less likely to cause LV dysfunction but can cause angina and palpitations.   MVP with mild MR on recent echo  Essential hypertension blood pressure normal.  Hyperlipidemia LDL 144.  Patient does not have history of coronary disease.  She is never been on a statin.  She would like to try diet.  Medication Adjustments/Labs and Tests Ordered: Current medicines are reviewed at length with the patient today.  Concerns regarding medicines are outlined above.  Medication changes, Labs and Tests ordered today are listed in the Patient Instructions below. Patient Instructions  Medication Instructions:  Your provider recommends that you continue on your current medications as directed. Please refer to the Current Medication list given to you today.   If you need a refill on your cardiac medications before your next appointment, please call your pharmacy.   Testing/Procedures: Your provider has requested that you have an echocardiogram in June, 2020. Echocardiography is a painless test that uses sound waves to create images of your heart. It provides your doctor with information about the size and shape of your heart and how well your heart's chambers and valves are working. This procedure  takes approximately one hour. There are no restrictions for this procedure.     Follow-Up: At [facility], you and your health needs are our priority.  As part of our continuing mission to provide you with exceptional heart care, we have created designated Provider Care Teams.  These Care Teams include your primary Cardiologist (physician) and Advanced Practice Providers (APPs -  Physician Assistants and Nurse Practitioners) who all work together to provide you with the care you need, when you need it. You will need a follow up appointment in 4 months.  Please call our office 2 months in advance to schedule this appointment.  You may see [name] Dawley, MD or one of the following Advanced Practice Providers on your designated Care Team:   [place], PA-C Melina Copa, PA-C . Ermalinda Barrios, PA-C      Signed, [name] Dawley, MD  07/17/2018 9:16 AM    [address], [place], [place]  [address] Phone: [phone]; Fax: [phone]

## 2018-07-17 NOTE — Patient Instructions (Signed)
Medication Instructions:  Your provider recommends that you continue on your current medications as directed. Please refer to the Current Medication list given to you today.   If you need a refill on your cardiac medications before your next appointment, please call your pharmacy.   Testing/Procedures: Your provider has requested that you have an echocardiogram in June, 2020. Echocardiography is a painless test that uses sound waves to create images of your heart. It provides your doctor with information about the size and shape of your heart and how well your heart's chambers and valves are working. This procedure takes approximately one hour. There are no restrictions for this procedure.     Follow-Up: At [facility], you and your health needs are our priority.  As part of our continuing mission to provide you with exceptional heart care, we have created designated Provider Care Teams.  These Care Teams include your primary Cardiologist (physician) and Advanced Practice Providers (APPs -  Physician Assistants and Nurse Practitioners) who all work together to provide you with the care you need, when you need it. You will need a follow up appointment in 4 months.  Please call our office 2 months in advance to schedule this appointment.  You may see Ena Dawley, MD or one of the following Advanced Practice Providers on your designated Care Team:   [place], PA-C Melina Copa, PA-C . Ermalinda Barrios, PA-C

## 2018-07-23 ENCOUNTER — Other Ambulatory Visit: Payer: Self-pay | Admitting: Oncology

## 2018-07-24 ENCOUNTER — Inpatient Hospital Stay (HOSPITAL_BASED_OUTPATIENT_CLINIC_OR_DEPARTMENT_OTHER): Payer: Medicare Other | Admitting: Medical

## 2018-07-24 ENCOUNTER — Other Ambulatory Visit: Payer: Self-pay | Admitting: Medical

## 2018-07-24 ENCOUNTER — Other Ambulatory Visit: Payer: Self-pay

## 2018-07-24 ENCOUNTER — Inpatient Hospital Stay: Payer: Medicare Other

## 2018-07-24 VITALS — BP 128/67 | HR 86 | Temp 98.6°F | Resp 16 | Ht 62.0 in | Wt 129.8 lb

## 2018-07-24 DIAGNOSIS — C8203 Follicular lymphoma grade I, intra-abdominal lymph nodes: Secondary | ICD-10-CM

## 2018-07-24 DIAGNOSIS — R6883 Chills (without fever): Secondary | ICD-10-CM

## 2018-07-24 DIAGNOSIS — R531 Weakness: Secondary | ICD-10-CM | POA: Diagnosis not present

## 2018-07-24 DIAGNOSIS — T8090XA Unspecified complication following infusion and therapeutic injection, initial encounter: Secondary | ICD-10-CM | POA: Diagnosis not present

## 2018-07-24 DIAGNOSIS — R55 Syncope and collapse: Secondary | ICD-10-CM | POA: Diagnosis not present

## 2018-07-24 DIAGNOSIS — C8213 Follicular lymphoma grade II, intra-abdominal lymph nodes: Secondary | ICD-10-CM | POA: Diagnosis not present

## 2018-07-24 DIAGNOSIS — Y92538 Other ambulatory health services establishments as the place of occurrence of the external cause: Secondary | ICD-10-CM

## 2018-07-24 DIAGNOSIS — M79602 Pain in left arm: Secondary | ICD-10-CM | POA: Diagnosis not present

## 2018-07-24 DIAGNOSIS — R079 Chest pain, unspecified: Secondary | ICD-10-CM | POA: Diagnosis not present

## 2018-07-24 DIAGNOSIS — Z5112 Encounter for antineoplastic immunotherapy: Secondary | ICD-10-CM | POA: Diagnosis not present

## 2018-07-24 DIAGNOSIS — X58XXXA Exposure to other specified factors, initial encounter: Secondary | ICD-10-CM | POA: Diagnosis not present

## 2018-07-24 LAB — GLUCOSE, CAPILLARY: Glucose-Capillary: 81 mg/dL (ref 70–99)

## 2018-07-24 MED ORDER — DIPHENHYDRAMINE HCL 25 MG PO CAPS
ORAL_CAPSULE | ORAL | Status: AC
  Filled 2018-07-24: qty 2

## 2018-07-24 MED ORDER — ACETAMINOPHEN 325 MG PO TABS
650.0000 mg | ORAL_TABLET | Freq: Once | ORAL | Status: AC
  Administered 2018-07-24: 650 mg via ORAL

## 2018-07-24 MED ORDER — SODIUM CHLORIDE 0.9 % IV SOLN
Freq: Once | INTRAVENOUS | Status: AC
  Administered 2018-07-24: 09:00:00 via INTRAVENOUS
  Filled 2018-07-24: qty 250

## 2018-07-24 MED ORDER — MEPERIDINE HCL 25 MG/ML IJ SOLN
25.0000 mg | Freq: Once | INTRAMUSCULAR | Status: AC
  Administered 2018-07-24: 25 mg via INTRAVENOUS

## 2018-07-24 MED ORDER — ACETAMINOPHEN 325 MG PO TABS
ORAL_TABLET | ORAL | Status: AC
  Filled 2018-07-24: qty 2

## 2018-07-24 MED ORDER — SODIUM CHLORIDE 0.9 % IV SOLN
Freq: Once | INTRAVENOUS | Status: AC | PRN
  Administered 2018-07-24: 12:00:00 via INTRAVENOUS
  Filled 2018-07-24: qty 250

## 2018-07-24 MED ORDER — SODIUM CHLORIDE 0.9 % IV SOLN
375.0000 mg/m2 | Freq: Once | INTRAVENOUS | Status: AC
  Administered 2018-07-24: 600 mg via INTRAVENOUS
  Filled 2018-07-24: qty 10

## 2018-07-24 MED ORDER — MEPERIDINE HCL 25 MG/ML IJ SOLN: 25.0000 mg | Freq: Once | INTRAMUSCULAR | Status: DC

## 2018-07-24 MED ORDER — DIPHENHYDRAMINE HCL 25 MG PO CAPS
50.0000 mg | ORAL_CAPSULE | Freq: Once | ORAL | Status: AC
  Administered 2018-07-24: 50 mg via ORAL

## 2018-07-24 NOTE — Progress Notes (Signed)
1235 PM -   Blood glucose  81 .  1225 -  Pt c/o lightheaded, feeling some nausea, feeling flush.  Denied shortness of breath.  Remained A&O x 3.  Rituxan stopped at 1225 pm.  Lucianne Lei, [place] assessed pt in infusion room.  Order received for IVF at 999 cc/hr.  EKG performed by Maudie Mercury, NP.  Lucianne Lei, PA reviewed EKG strip.  PA to consult with Dr. Benay Spice. Pt c/o feeling shivering; no rigor noted by nurse.  Pt remained A&O x 3.  Order received for Demerol 25 mg IVP per PA. Pt was being monitored very closely.   1345 -  Pt stated feeling much better - " feel like normal again ".  Pt ambulated to BR with NT with steady gait.  VSS.  Rituxan resumed at 1345 at rate 200 mg - 104 cc/hr x 52 ml. Pt was able to complete Rituxan infusion without further problems.  Prior to discharge, pt ambulated with nurse to BR with steady gait.   Pt was stable at discharge via ambulation with nurse to lobby to meet sister.

## 2018-07-24 NOTE — Progress Notes (Signed)
    DATE:  07/24/2018                                          X  CHEMO/IMMUNOTHERAPY REACTION           MD:  Dr. Dominica Severin B. Sherrill    AGENT/BLOOD PRODUCT RECEIVING TODAY:               Rituximab   AGENT/BLOOD PRODUCT RECEIVING IMMEDIATELY PRIOR TO REACTION:           Rituximab   VS: BP:      65/44    P:        55       SPO2:        99% on room air  T: 98.7                BP:      139/69   P:        75       SPO2:        100% on room air   T: 98.4   REACTION(S):           Weakness, flushing, presyncope, and chills   PREMEDS:       Tylenol 650 mg p.o. x1 and Benadryl   INTERVENTION: Patient was given a fluid bolus Demerol 25 mg IV x1   Review of Systems  Review of Systems  Constitutional: Negative for chills, diaphoresis and fever.       Flushing  HENT: Negative for trouble swallowing and voice change.   Respiratory: Negative for cough, chest tightness, shortness of breath and wheezing.   Cardiovascular: Negative for chest pain and palpitations.  Gastrointestinal: Negative for abdominal pain, constipation, diarrhea, nausea and vomiting.  Musculoskeletal: Negative for back pain and myalgias.  Neurological: Positive for weakness. Negative for dizziness, light-headedness and headaches.       Presyncope     Physical Exam  Physical Exam Constitutional:      General: She is not in acute distress.    Appearance: She is not diaphoretic.  HENT:     Head: Normocephalic and atraumatic.  Cardiovascular:     Rate and Rhythm: Normal rate and regular rhythm.     Heart sounds: Normal heart sounds. No murmur. No friction rub. No gallop.   Pulmonary:     Effort: Pulmonary effort is normal. No respiratory distress.     Breath sounds: Normal breath sounds. No wheezing or rales.  Skin:    General: Skin is warm and dry.     Findings: No erythema or rash.  Neurological:     Mental Status: She is alert.     OUTCOME:         The patient's symptoms abated after she was given a fluid bolus  and Demerol 25 mg IV.  A fingerstick blood glucose returned at 81.  An EKG was completed which returned showing a normal sinus rhythm with left ventricular hypertrophy with repolarization abnormality.  Patient was able to restart rituximab at 200 mg/h.  She completed her infusion with no further issues of concern.           Sandi Mealy, MHS, PA-C

## 2018-07-24 NOTE — Patient Instructions (Signed)
[facility] Discharge Instructions for Patients Receiving Chemotherapy  Today you received the following chemotherapy agents: Rituximab   To help prevent nausea and vomiting after your treatment, we encourage you to take your nausea medication  as prescribed.    If you develop nausea and vomiting that is not controlled by your nausea medication, call the clinic.   BELOW ARE SYMPTOMS THAT SHOULD BE REPORTED IMMEDIATELY:  *FEVER GREATER THAN 100.5 F  *CHILLS WITH OR WITHOUT FEVER  NAUSEA AND VOMITING THAT IS NOT CONTROLLED WITH YOUR NAUSEA MEDICATION  *UNUSUAL SHORTNESS OF BREATH  *UNUSUAL BRUISING OR BLEEDING  TENDERNESS IN MOUTH AND THROAT WITH OR WITHOUT PRESENCE OF ULCERS  *URINARY PROBLEMS  *BOWEL PROBLEMS  UNUSUAL RASH Items with * indicate a potential emergency and should be followed up as soon as possible.  Feel free to call the clinic should you have any questions or concerns. The clinic phone number is [phone].  Please show the CHEMO ALERT CARD at check-in to the Emergency Department and triage nurse.   

## 2018-07-24 NOTE — Progress Notes (Signed)
These preliminary result these preliminary results were noted.  Awaiting final report.

## 2018-07-26 ENCOUNTER — Telehealth: Payer: Self-pay | Admitting: *Deleted

## 2018-07-26 NOTE — Telephone Encounter (Signed)
Called [name] [name] for chemotherapy F/U.  Patient is doing well.  "Things got a little intense with treatment.  Really washed out but slept well that night."  Denies n/v, new side effects or symptoms.  Bowel and bladder functioning well.  Eating and drinking well.  Instructed to drink 64 oz minimum daily or at least the day before, of and after treatment.  Asked about s   Denies further questions or needs at this time.  Encouraged to call [phone] Mon -Fri 8:00 am - 4:30 pm or anytime as needed for symptoms, changes or event outside office hours.

## 2018-07-26 NOTE — Telephone Encounter (Signed)
-----   Message from Jarvis Morgan, RN sent at 07/25/2018  3:54 PM EDT ----- Regarding: Chemo follow up First time Rituxan 07/24/18.  Had mild hypersensitivity , but was able to complete infusion. Dr. Benay Spice, [place], RN

## 2018-07-28 ENCOUNTER — Telehealth: Payer: Self-pay

## 2018-07-28 NOTE — Telephone Encounter (Signed)
Left voice message for patient as chemo follow up s/p first time Rituxan 07/24/18.  Requested she call his back to let us know she is okay and not having any adverse side effects.

## 2018-07-31 ENCOUNTER — Inpatient Hospital Stay (HOSPITAL_BASED_OUTPATIENT_CLINIC_OR_DEPARTMENT_OTHER): Payer: Medicare Other | Admitting: Nurse Practitioner

## 2018-07-31 ENCOUNTER — Other Ambulatory Visit: Payer: Self-pay

## 2018-07-31 ENCOUNTER — Other Ambulatory Visit: Payer: Self-pay | Admitting: Oncology

## 2018-07-31 ENCOUNTER — Inpatient Hospital Stay: Payer: Medicare Other

## 2018-07-31 ENCOUNTER — Encounter: Payer: Self-pay | Admitting: Nurse Practitioner

## 2018-07-31 VITALS — BP 127/64 | HR 75 | Temp 98.7°F | Resp 17

## 2018-07-31 VITALS — BP 137/77 | HR 97 | Temp 98.6°F | Resp 18 | Ht 62.0 in | Wt 130.5 lb

## 2018-07-31 DIAGNOSIS — E785 Hyperlipidemia, unspecified: Secondary | ICD-10-CM

## 2018-07-31 DIAGNOSIS — M1990 Unspecified osteoarthritis, unspecified site: Secondary | ICD-10-CM

## 2018-07-31 DIAGNOSIS — Z86010 Personal history of colonic polyps: Secondary | ICD-10-CM

## 2018-07-31 DIAGNOSIS — R079 Chest pain, unspecified: Secondary | ICD-10-CM

## 2018-07-31 DIAGNOSIS — R6883 Chills (without fever): Secondary | ICD-10-CM | POA: Diagnosis not present

## 2018-07-31 DIAGNOSIS — C8213 Follicular lymphoma grade II, intra-abdominal lymph nodes: Secondary | ICD-10-CM | POA: Diagnosis not present

## 2018-07-31 DIAGNOSIS — R55 Syncope and collapse: Secondary | ICD-10-CM | POA: Diagnosis not present

## 2018-07-31 DIAGNOSIS — M79602 Pain in left arm: Secondary | ICD-10-CM | POA: Diagnosis not present

## 2018-07-31 DIAGNOSIS — C8203 Follicular lymphoma grade I, intra-abdominal lymph nodes: Secondary | ICD-10-CM

## 2018-07-31 DIAGNOSIS — R531 Weakness: Secondary | ICD-10-CM

## 2018-07-31 DIAGNOSIS — Z5112 Encounter for antineoplastic immunotherapy: Secondary | ICD-10-CM | POA: Diagnosis not present

## 2018-07-31 DIAGNOSIS — I10 Essential (primary) hypertension: Secondary | ICD-10-CM | POA: Diagnosis not present

## 2018-07-31 DIAGNOSIS — M8580 Other specified disorders of bone density and structure, unspecified site: Secondary | ICD-10-CM | POA: Diagnosis not present

## 2018-07-31 DIAGNOSIS — K219 Gastro-esophageal reflux disease without esophagitis: Secondary | ICD-10-CM | POA: Diagnosis not present

## 2018-07-31 DIAGNOSIS — Z79899 Other long term (current) drug therapy: Secondary | ICD-10-CM | POA: Diagnosis not present

## 2018-07-31 DIAGNOSIS — Z888 Allergy status to other drugs, medicaments and biological substances status: Secondary | ICD-10-CM

## 2018-07-31 MED ORDER — ACETAMINOPHEN 325 MG PO TABS
650.0000 mg | ORAL_TABLET | Freq: Once | ORAL | Status: AC
  Administered 2018-07-31: 650 mg via ORAL

## 2018-07-31 MED ORDER — SODIUM CHLORIDE 0.9 % IV SOLN
375.0000 mg/m2 | Freq: Once | INTRAVENOUS | Status: AC
  Administered 2018-07-31: 600 mg via INTRAVENOUS
  Filled 2018-07-31: qty 50

## 2018-07-31 MED ORDER — DIPHENHYDRAMINE HCL 25 MG PO CAPS
ORAL_CAPSULE | ORAL | Status: AC
  Filled 2018-07-31: qty 2

## 2018-07-31 MED ORDER — DIPHENHYDRAMINE HCL 25 MG PO CAPS
50.0000 mg | ORAL_CAPSULE | Freq: Once | ORAL | Status: AC
  Administered 2018-07-31: 50 mg via ORAL

## 2018-07-31 MED ORDER — SODIUM CHLORIDE 0.9 % IV SOLN
Freq: Once | INTRAVENOUS | Status: AC
  Administered 2018-07-31: 12:00:00 via INTRAVENOUS
  Filled 2018-07-31: qty 250

## 2018-07-31 MED ORDER — ACETAMINOPHEN 325 MG PO TABS
ORAL_TABLET | ORAL | Status: AC
  Filled 2018-07-31: qty 2

## 2018-07-31 NOTE — Progress Notes (Addendum)
  [place] OFFICE PROGRESS NOTE   Diagnosis: Non-Hodgkin's lymphoma  INTERVAL HISTORY:   [name] [name] returns as scheduled.  She completed cycle 1 weekly rituximab 07/24/2018.  During the infusion she developed weakness, flushing, presyncope and chills.  She received a fluid bolus and Demerol 25 mg IV x1.  She reports the symptoms completely resolved and she had no further problems when the infusion was resumed.  She was able to complete the infusion.  She reports she did not have a rash.  She feels weak especially in the morning hours.  No lightheadedness or dizziness.  She continues to have an intermittent "nagging pain" at the left chest.  She also noted an "achy" pain at the left lower arm recently.   Objective:  Vital signs in last 24 hours:  Blood pressure 137/77, pulse 97, temperature 98.6 F (37 C), temperature source Oral, resp. rate 18, height 5\' 2"  (1.575 m), weight 130 lb 8 oz (59.2 kg), SpO2 99 %.    Resp: Respirations even and unlabored. Vascular: No upper extremity or lower extremity edema. Neuro: Alert and oriented. Skin: No rash.   Lab Results:  Lab Results  Component Value Date   WBC 7.8 07/07/2018   HGB 13.8 07/07/2018   HCT 43.1 07/07/2018   MCV 91.7 07/07/2018   PLT 251 07/07/2018   NEUTROABS 4.4 07/07/2018    Imaging:  No results found.  Medications: I have reviewed the patient's current medications.  Assessment/Plan: 1. Non-Hodgkin's lymphoma-follicular lymphoma  Mesenteric lymph node biopsy 9/32/6712- follicular center cell lymphoma, grade 2  Treated with mitoxantrone, fludarabine, and rituximab completed in January 2002  Progressive disease in February 2004, treated with single agent rituximab February 2004 through August 2005  CTs 06/21/2014- no evidence of lymphoma  CT abdomen/pelvis 06/05/2018- enlarging left mid abdominal mesenteric mass  CT biopsy of the mesenteric mass 06/16/2018-follicular lymphoma, low-grade, CD20  positive  Cycle 1 weekly Rituxan 07/24/2018  Cycle 2 weekly Rituxan 07/31/2018 2. Irritable bowel syndrome 3. History of anal incontinence 4. Osteopenia 5. History of colon polyps 6. Weakness, flushing, presyncope and chills during Rituxan infusion 07/24/2018.  Disposition: [name] [name] appears stable.  She completed cycle 1 weekly Rituxan 07/24/2018.  During the infusion she became hypotensive and had chills.  She received a fluid bolus and Demerol with resolution of the symptoms.  The Rituxan was resumed and completed.  Plan to proceed with cycle 2 weekly Rituxan today as scheduled.  She will be monitored closely for recurrence of the above.  She will return for lab and cycle 3 weekly Rituxan in 1 week.  We will see her prior to the fourth and final treatment in 2 weeks.  She will contact the office in the interim with any problems.  Patient seen with Dr. Benay Spice.    Ned Card ANP/GNP-BC   07/31/2018  10:45 AM This was a shared visit with Ned Card.  [name] [name] appears well.  She had an fusion reaction with the first dose of rituximab.  She will complete week 2 today.  [name] Manson, MD

## 2018-07-31 NOTE — Patient Instructions (Signed)
Coronavirus (COVID-19) Are you at risk?  Are you at risk for the Coronavirus (COVID-19)?  To be considered HIGH RISK for Coronavirus (COVID-19), you have to meet the following criteria:  . Traveled to [place], [place], [place], [place] or [place]; or in the [place] to [place], [place], [place], or [place]; and have fever, cough, and shortness of breath within the last 2 weeks of travel OR . Been in close contact with a person diagnosed with COVID-19 within the last 2 weeks and have fever, cough, and shortness of breath . IF YOU DO NOT MEET THESE CRITERIA, YOU ARE CONSIDERED LOW RISK FOR COVID-19.  What to do if you are HIGH RISK for COVID-19?  . If you are having a medical emergency, call 911. . Seek medical care right away. Before you go to a doctor's office, urgent care or emergency department, call ahead and tell them about your recent travel, contact with someone diagnosed with COVID-19, and your symptoms. You should receive instructions from your physician's office regarding next steps of care.  . When you arrive at healthcare provider, tell the healthcare staff immediately you have returned from visiting [place], [place], [place], [place] or [place]; or traveled in the [place] to [place], [place], [place], or [place]; in the last two weeks or you have been in close contact with a person diagnosed with COVID-19 in the last 2 weeks.   . Tell the health care staff about your symptoms: fever, cough and shortness of breath. . After you have been seen by a medical provider, you will be either: o Tested for (COVID-19) and discharged home on quarantine except to seek medical care if symptoms worsen, and asked to  - Stay home and avoid contact with others until you get your results (4-5 days)  - Avoid travel on public transportation if possible (such as bus, train, or airplane) or o Sent to the Emergency Department by EMS for evaluation, COVID-19 testing, and possible  admission depending on your condition and test results.  What to do if you are LOW RISK for COVID-19?  Reduce your risk of any infection by using the same precautions used for avoiding the common cold or flu:  . Wash your hands often with soap and warm water for at least 20 seconds.  If soap and water are not readily available, use an alcohol-based hand sanitizer with at least 60% alcohol.  . If coughing or sneezing, cover your mouth and nose by coughing or sneezing into the elbow areas of your shirt or coat, into a tissue or into your sleeve (not your hands). . Avoid shaking hands with others and consider head nods or verbal greetings only. . Avoid touching your eyes, nose, or mouth with unwashed hands.  . Avoid close contact with people who are sick. . Avoid places or events with large numbers of people in one location, like concerts or sporting events. . Carefully consider travel plans you have or are making. . If you are planning any travel outside or inside the [place], visit the CDC's Travelers' Health webpage for the latest health notices. . If you have some symptoms but not all symptoms, continue to monitor at home and seek medical attention if your symptoms worsen. . If you are having a medical emergency, call 911.  ADDITIONAL HEALTHCARE OPTIONS FOR PATIENTS  [facility] / e-Visit: [email or web address]         [facility]: [phone]  [facility]: [phone]                   [facility]: [phone]   [facility] Discharge Instructions for Patients Receiving Chemotherapy  Today you received the following chemotherapy agents: Rituximab (Rituxan)  To help prevent nausea and vomiting after your treatment, we encourage you to take your nausea medication as directed.    If you develop nausea and vomiting that is not controlled by your nausea medication, call the clinic.    BELOW ARE SYMPTOMS THAT SHOULD BE REPORTED IMMEDIATELY:  *FEVER GREATER THAN 100.5 F  *CHILLS WITH OR WITHOUT FEVER  NAUSEA AND VOMITING THAT IS NOT CONTROLLED WITH YOUR NAUSEA MEDICATION  *UNUSUAL SHORTNESS OF BREATH  *UNUSUAL BRUISING OR BLEEDING  TENDERNESS IN MOUTH AND THROAT WITH OR WITHOUT PRESENCE OF ULCERS  *URINARY PROBLEMS  *BOWEL PROBLEMS  UNUSUAL RASH Items with * indicate a potential emergency and should be followed up as soon as possible.  Feel free to call the clinic should you have any questions or concerns. The clinic phone number is [phone].  Please show the CHEMO ALERT CARD at check-in to the Emergency Department and triage nurse.   

## 2018-08-01 ENCOUNTER — Telehealth: Payer: Self-pay | Admitting: Oncology

## 2018-08-01 ENCOUNTER — Encounter: Payer: Self-pay | Admitting: Oncology

## 2018-08-01 NOTE — Telephone Encounter (Signed)
Called and scheduled appt per 3/30 los. ° °Patient aware of appt date and time. °

## 2018-08-01 NOTE — Progress Notes (Signed)
Patient called regarding my message to see her.  Introduced myself as Arboriculturist and to offer available resources. Patient has 2 insurances therefore copay assistance will not be needed.  Discussed one-time $38 [place] and qualifications to assist with personal expenses while going through treatment such as gas cards. Patient states she exceeds income guidelines.  Discussed [facility] application being that she lives in [place]. She states she has attended their fundraisers and is familiar with the assistance but would rather leave that opportunity for people whom really need it.  She has my card for any additional financial questions or concerns.

## 2018-08-02 ENCOUNTER — Telehealth: Payer: Self-pay | Admitting: Oncology

## 2018-08-02 NOTE — Telephone Encounter (Signed)
Called patient regarding adding additional appointments, correct appointments have already been scheduled. Patient is notified.

## 2018-08-06 ENCOUNTER — Other Ambulatory Visit: Payer: Self-pay | Admitting: Oncology

## 2018-08-07 ENCOUNTER — Inpatient Hospital Stay: Payer: Medicare Other

## 2018-08-07 ENCOUNTER — Other Ambulatory Visit: Payer: Self-pay

## 2018-08-07 ENCOUNTER — Inpatient Hospital Stay: Payer: Medicare Other | Attending: Oncology

## 2018-08-07 VITALS — BP 114/56 | HR 72 | Temp 98.4°F | Resp 16

## 2018-08-07 DIAGNOSIS — C8203 Follicular lymphoma grade I, intra-abdominal lymph nodes: Secondary | ICD-10-CM

## 2018-08-07 DIAGNOSIS — M545 Low back pain: Secondary | ICD-10-CM | POA: Insufficient documentation

## 2018-08-07 DIAGNOSIS — M8580 Other specified disorders of bone density and structure, unspecified site: Secondary | ICD-10-CM | POA: Insufficient documentation

## 2018-08-07 DIAGNOSIS — Z5112 Encounter for antineoplastic immunotherapy: Secondary | ICD-10-CM | POA: Insufficient documentation

## 2018-08-07 DIAGNOSIS — R109 Unspecified abdominal pain: Secondary | ICD-10-CM | POA: Insufficient documentation

## 2018-08-07 DIAGNOSIS — C8213 Follicular lymphoma grade II, intra-abdominal lymph nodes: Secondary | ICD-10-CM | POA: Insufficient documentation

## 2018-08-07 LAB — CBC WITH DIFFERENTIAL (CANCER CENTER ONLY)
Abs Immature Granulocytes: 0.03 10*3/uL (ref 0.00–0.07)
Basophils Absolute: 0.1 10*3/uL (ref 0.0–0.1)
Basophils Relative: 1 %
Eosinophils Absolute: 0.1 10*3/uL (ref 0.0–0.5)
Eosinophils Relative: 1 %
HCT: 45.4 % (ref 36.0–46.0)
Hemoglobin: 15.1 g/dL — ABNORMAL HIGH (ref 12.0–15.0)
Immature Granulocytes: 0 %
Lymphocytes Relative: 15 %
Lymphs Abs: 1.5 10*3/uL (ref 0.7–4.0)
MCH: 30 pg (ref 26.0–34.0)
MCHC: 33.3 g/dL (ref 30.0–36.0)
MCV: 90.3 fL (ref 80.0–100.0)
Monocytes Absolute: 0.9 10*3/uL (ref 0.1–1.0)
Monocytes Relative: 10 %
Neutro Abs: 7 10*3/uL (ref 1.7–7.7)
Neutrophils Relative %: 73 %
Platelet Count: 295 10*3/uL (ref 150–400)
RBC: 5.03 MIL/uL (ref 3.87–5.11)
RDW: 12.9 % (ref 11.5–15.5)
WBC Count: 9.6 10*3/uL (ref 4.0–10.5)
nRBC: 0 % (ref 0.0–0.2)

## 2018-08-07 LAB — CMP (CANCER CENTER ONLY)
ALT: 18 U/L (ref 0–44)
AST: 21 U/L (ref 15–41)
Albumin: 4 g/dL (ref 3.5–5.0)
Alkaline Phosphatase: 66 U/L (ref 38–126)
Anion gap: 10 (ref 5–15)
BUN: 13 mg/dL (ref 8–23)
CO2: 24 mmol/L (ref 22–32)
Calcium: 10.5 mg/dL — ABNORMAL HIGH (ref 8.9–10.3)
Chloride: 107 mmol/L (ref 98–111)
Creatinine: 0.85 mg/dL (ref 0.44–1.00)
GFR, Est AFR Am: >60 mL/min (ref >60)
GFR, Estimated: >60 mL/min (ref >60)
Glucose, Bld: 104 mg/dL — ABNORMAL HIGH (ref 70–99)
Potassium: 4.1 mmol/L (ref 3.5–5.1)
Sodium: 141 mmol/L (ref 135–145)
Total Bilirubin: 0.3 mg/dL (ref 0.3–1.2)
Total Protein: 6.7 g/dL (ref 6.5–8.1)

## 2018-08-07 MED ORDER — ACETAMINOPHEN 325 MG PO TABS
ORAL_TABLET | ORAL | Status: AC
  Filled 2018-08-07: qty 2

## 2018-08-07 MED ORDER — SODIUM CHLORIDE 0.9 % IV SOLN
Freq: Once | INTRAVENOUS | Status: AC
  Administered 2018-08-07: 10:00:00 via INTRAVENOUS
  Filled 2018-08-07: qty 250

## 2018-08-07 MED ORDER — ACETAMINOPHEN 325 MG PO TABS
650.0000 mg | ORAL_TABLET | Freq: Once | ORAL | Status: AC
  Administered 2018-08-07: 650 mg via ORAL

## 2018-08-07 MED ORDER — SODIUM CHLORIDE 0.9 % IV SOLN
375.0000 mg/m2 | Freq: Once | INTRAVENOUS | Status: AC
  Administered 2018-08-07: 600 mg via INTRAVENOUS
  Filled 2018-08-07: qty 10

## 2018-08-07 MED ORDER — DIPHENHYDRAMINE HCL 25 MG PO CAPS
50.0000 mg | ORAL_CAPSULE | Freq: Once | ORAL | Status: AC
  Administered 2018-08-07: 10:00:00 50 mg via ORAL

## 2018-08-07 MED ORDER — DIPHENHYDRAMINE HCL 25 MG PO CAPS
ORAL_CAPSULE | ORAL | Status: AC
  Filled 2018-08-07: qty 2

## 2018-08-07 NOTE — Patient Instructions (Signed)
[facility] Discharge Instructions for Patients Receiving Chemotherapy  Today you received the following chemotherapy agents: Rituximab   To help prevent nausea and vomiting after your treatment, we encourage you to take your nausea medication  as prescribed.    If you develop nausea and vomiting that is not controlled by your nausea medication, call the clinic.   BELOW ARE SYMPTOMS THAT SHOULD BE REPORTED IMMEDIATELY:  *FEVER GREATER THAN 100.5 F  *CHILLS WITH OR WITHOUT FEVER  NAUSEA AND VOMITING THAT IS NOT CONTROLLED WITH YOUR NAUSEA MEDICATION  *UNUSUAL SHORTNESS OF BREATH  *UNUSUAL BRUISING OR BLEEDING  TENDERNESS IN MOUTH AND THROAT WITH OR WITHOUT PRESENCE OF ULCERS  *URINARY PROBLEMS  *BOWEL PROBLEMS  UNUSUAL RASH Items with * indicate a potential emergency and should be followed up as soon as possible.  Feel free to call the clinic should you have any questions or concerns. The clinic phone number is [phone].  Please show the CHEMO ALERT CARD at check-in to the Emergency Department and triage nurse.   

## 2018-08-10 ENCOUNTER — Other Ambulatory Visit: Payer: Self-pay | Admitting: Nurse Practitioner

## 2018-08-10 DIAGNOSIS — C8203 Follicular lymphoma grade I, intra-abdominal lymph nodes: Secondary | ICD-10-CM

## 2018-08-13 ENCOUNTER — Other Ambulatory Visit: Payer: Self-pay | Admitting: Oncology

## 2018-08-14 ENCOUNTER — Other Ambulatory Visit: Payer: Self-pay | Admitting: Nurse Practitioner

## 2018-08-14 ENCOUNTER — Telehealth: Payer: Self-pay | Admitting: Oncology

## 2018-08-14 ENCOUNTER — Inpatient Hospital Stay: Payer: Medicare Other

## 2018-08-14 ENCOUNTER — Inpatient Hospital Stay (HOSPITAL_BASED_OUTPATIENT_CLINIC_OR_DEPARTMENT_OTHER): Payer: Medicare Other | Admitting: Oncology

## 2018-08-14 ENCOUNTER — Other Ambulatory Visit: Payer: Self-pay

## 2018-08-14 VITALS — BP 135/61 | HR 81 | Temp 98.5°F | Resp 18 | Ht 62.0 in | Wt 129.8 lb

## 2018-08-14 VITALS — BP 125/52 | HR 73 | Temp 98.6°F | Resp 18

## 2018-08-14 DIAGNOSIS — C8203 Follicular lymphoma grade I, intra-abdominal lymph nodes: Secondary | ICD-10-CM

## 2018-08-14 DIAGNOSIS — M8580 Other specified disorders of bone density and structure, unspecified site: Secondary | ICD-10-CM | POA: Diagnosis not present

## 2018-08-14 DIAGNOSIS — R109 Unspecified abdominal pain: Secondary | ICD-10-CM

## 2018-08-14 DIAGNOSIS — C8213 Follicular lymphoma grade II, intra-abdominal lymph nodes: Secondary | ICD-10-CM | POA: Diagnosis not present

## 2018-08-14 DIAGNOSIS — Z5112 Encounter for antineoplastic immunotherapy: Secondary | ICD-10-CM | POA: Diagnosis not present

## 2018-08-14 DIAGNOSIS — M545 Low back pain: Secondary | ICD-10-CM

## 2018-08-14 LAB — CMP (CANCER CENTER ONLY)
ALT: 16 U/L (ref 0–44)
AST: 22 U/L (ref 15–41)
Albumin: 4 g/dL (ref 3.5–5.0)
Alkaline Phosphatase: 69 U/L (ref 38–126)
Anion gap: 11 (ref 5–15)
BUN: 16 mg/dL (ref 8–23)
CO2: 22 mmol/L (ref 22–32)
Calcium: 10.5 mg/dL — ABNORMAL HIGH (ref 8.9–10.3)
Chloride: 108 mmol/L (ref 98–111)
Creatinine: 0.79 mg/dL (ref 0.44–1.00)
GFR, Est AFR Am: >60 mL/min (ref >60)
GFR, Estimated: >60 mL/min (ref >60)
Glucose, Bld: 99 mg/dL (ref 70–99)
Potassium: 4.2 mmol/L (ref 3.5–5.1)
Sodium: 141 mmol/L (ref 135–145)
Total Bilirubin: 0.3 mg/dL (ref 0.3–1.2)
Total Protein: 6.9 g/dL (ref 6.5–8.1)

## 2018-08-14 LAB — CBC WITH DIFFERENTIAL (CANCER CENTER ONLY)
Abs Immature Granulocytes: 0.02 10*3/uL (ref 0.00–0.07)
Basophils Absolute: 0 10*3/uL (ref 0.0–0.1)
Basophils Relative: 1 %
Eosinophils Absolute: 0.1 10*3/uL (ref 0.0–0.5)
Eosinophils Relative: 1 %
HCT: 45.3 % (ref 36.0–46.0)
Hemoglobin: 14.6 g/dL (ref 12.0–15.0)
Immature Granulocytes: 0 %
Lymphocytes Relative: 16 %
Lymphs Abs: 1.3 10*3/uL (ref 0.7–4.0)
MCH: 29.8 pg (ref 26.0–34.0)
MCHC: 32.2 g/dL (ref 30.0–36.0)
MCV: 92.4 fL (ref 80.0–100.0)
Monocytes Absolute: 0.9 10*3/uL (ref 0.1–1.0)
Monocytes Relative: 11 %
Neutro Abs: 5.9 10*3/uL (ref 1.7–7.7)
Neutrophils Relative %: 71 %
Platelet Count: 259 10*3/uL (ref 150–400)
RBC: 4.9 MIL/uL (ref 3.87–5.11)
RDW: 12.8 % (ref 11.5–15.5)
WBC Count: 8.2 10*3/uL (ref 4.0–10.5)
nRBC: 0 % (ref 0.0–0.2)

## 2018-08-14 MED ORDER — PREDNISONE 50 MG PO TABS: ORAL_TABLET | ORAL | 0 refills | Status: DC

## 2018-08-14 MED ORDER — SODIUM CHLORIDE 0.9 % IV SOLN
Freq: Once | INTRAVENOUS | Status: AC
  Administered 2018-08-14: 09:00:00 via INTRAVENOUS
  Filled 2018-08-14: qty 250

## 2018-08-14 MED ORDER — ACETAMINOPHEN 325 MG PO TABS
650.0000 mg | ORAL_TABLET | Freq: Once | ORAL | Status: AC
  Administered 2018-08-14: 650 mg via ORAL

## 2018-08-14 MED ORDER — DIPHENHYDRAMINE HCL 25 MG PO CAPS
ORAL_CAPSULE | ORAL | Status: AC
  Filled 2018-08-14: qty 2

## 2018-08-14 MED ORDER — DIPHENHYDRAMINE HCL 25 MG PO CAPS
50.0000 mg | ORAL_CAPSULE | Freq: Once | ORAL | Status: AC
  Administered 2018-08-14: 50 mg via ORAL

## 2018-08-14 MED ORDER — SODIUM CHLORIDE 0.9 % IV SOLN
375.0000 mg/m2 | Freq: Once | INTRAVENOUS | Status: AC
  Administered 2018-08-14: 600 mg via INTRAVENOUS
  Filled 2018-08-14: qty 50

## 2018-08-14 MED ORDER — ACETAMINOPHEN 325 MG PO TABS
ORAL_TABLET | ORAL | Status: AC
  Filled 2018-08-14: qty 2

## 2018-08-14 NOTE — Progress Notes (Signed)
  [facility] OFFICE PROGRESS NOTE   Diagnosis: Non-Hodgkin's lymphoma  INTERVAL HISTORY:   [name] [name] returns as scheduled.  She was last treated with rituximab on 08/07/2018.  She reports no sign of an allergic reaction.  She feels well.  She reports mild discomfort of the left lower back.  She continues to have intermittent abdominal discomfort when laying on her left side.  The abdominal pain is partially improved since beginning rituximab.  Objective:  Vital signs in last 24 hours:  Blood pressure 135/61, pulse 81, temperature 98.5 F (36.9 C), temperature source Oral, resp. rate 18, height 5\' 2"  (1.575 m), weight 129 lb 12.8 oz (58.9 kg), SpO2 100 %.    Resp: Lungs clear bilaterally Cardio: Regular rate and rhythm GI: No hepatosplenomegaly, no mass, mild tenderness in the left subcostal region Vascular: No leg edema   Lab Results:  Lab Results  Component Value Date   WBC 8.2 08/14/2018   HGB 14.6 08/14/2018   HCT 45.3 08/14/2018   MCV 92.4 08/14/2018   PLT 259 08/14/2018   NEUTROABS 5.9 08/14/2018    CMP  Lab Results  Component Value Date   NA 141 08/07/2018   K 4.1 08/07/2018   CL 107 08/07/2018   CO2 24 08/07/2018   GLUCOSE 104 (H) 08/07/2018   BUN 13 08/07/2018   CREATININE 0.85 08/07/2018   CALCIUM 10.5 (H) 08/07/2018   PROT 6.7 08/07/2018   ALBUMIN 4.0 08/07/2018   AST 21 08/07/2018   ALT 18 08/07/2018   ALKPHOS 66 08/07/2018   BILITOT 0.3 08/07/2018   GFRNONAA >60 08/07/2018   GFRAA >60 08/07/2018     Medications: I have reviewed the patient's current medications.   Assessment/Plan: 1. Non-Hodgkin's lymphoma-follicular lymphoma  Mesenteric lymph node biopsy 7/68/0881- follicular center cell lymphoma, grade 2  Treated with mitoxantrone, fludarabine, and rituximab completed in January 2002  Progressive disease in February 2004, treated with single agent rituximab February 2004 through August 2005  CTs 06/21/2014- no evidence  of lymphoma  CT abdomen/pelvis 06/05/2018- enlarging left mid abdominal mesenteric mass  CT biopsy of the mesenteric mass 06/16/2018-follicular lymphoma, low-grade, CD20 positive  Cycle 1 weekly Rituxan 07/24/2018  Cycle 2 weekly Rituxan 07/31/2018   Cycle 3 weekly Rituxan 08/07/2018  Cycle 4 weekly Rituxan 08/14/2018 2. Irritable bowel syndrome 3. History of anal incontinence 4. Osteopenia 5. History of colon polyps 6. Weakness, flushing, presyncope and chills during Rituxan infusion 07/24/2018.    Disposition: [name] [name] appears unchanged.  She is tolerating the rituximab well.  She will complete week for today.  She will be scheduled for a restaging CT prior to an office visit in 2 months.  We will change to a maintenance rituximab schedule if the CT confirms a clinical response.  [name] [name] will contact us in the interim as needed.  Betsy Coder, MD  08/14/2018  8:28 AM

## 2018-08-14 NOTE — Telephone Encounter (Signed)
Scheduled appt per 4/13 los.  Central radiology will contact patient about scan appt.

## 2018-08-14 NOTE — Patient Instructions (Signed)
[facility] Discharge Instructions for Patients Receiving Chemotherapy  Today you received the following chemotherapy agents: Rituximab   To help prevent nausea and vomiting after your treatment, we encourage you to take your nausea medication  as prescribed.    If you develop nausea and vomiting that is not controlled by your nausea medication, call the clinic.   BELOW ARE SYMPTOMS THAT SHOULD BE REPORTED IMMEDIATELY:  *FEVER GREATER THAN 100.5 F  *CHILLS WITH OR WITHOUT FEVER  NAUSEA AND VOMITING THAT IS NOT CONTROLLED WITH YOUR NAUSEA MEDICATION  *UNUSUAL SHORTNESS OF BREATH  *UNUSUAL BRUISING OR BLEEDING  TENDERNESS IN MOUTH AND THROAT WITH OR WITHOUT PRESENCE OF ULCERS  *URINARY PROBLEMS  *BOWEL PROBLEMS  UNUSUAL RASH Items with * indicate a potential emergency and should be followed up as soon as possible.  Feel free to call the clinic should you have any questions or concerns. The clinic phone number is [phone].  Please show the CHEMO ALERT CARD at check-in to the Emergency Department and triage nurse.   

## 2018-08-24 ENCOUNTER — Other Ambulatory Visit: Payer: Self-pay | Admitting: Internal Medicine

## 2018-09-27 ENCOUNTER — Telehealth: Payer: Self-pay | Admitting: *Deleted

## 2018-09-27 DIAGNOSIS — C8203 Follicular lymphoma grade I, intra-abdominal lymph nodes: Secondary | ICD-10-CM

## 2018-09-27 NOTE — Telephone Encounter (Addendum)
Asking for status of her CT scan needed before visit on 10/19/18. Asking to have scan at [facility] on [address] since it is quieter there. PA has not been processed at this time--sent email to managed care to follow up. CT scan authorized and scheduled for 10/09/18 at 51 with arrival at 0830 to start water based contrast. She will come to [facility] at [place] for her lab. NPO 6 hours prior to scan. Patient has her Prednisone and Benadryl premeds for scan.

## 2018-10-02 DIAGNOSIS — G245 Blepharospasm: Secondary | ICD-10-CM | POA: Diagnosis not present

## 2018-10-03 DIAGNOSIS — H43392 Other vitreous opacities, left eye: Secondary | ICD-10-CM | POA: Diagnosis not present

## 2018-10-03 DIAGNOSIS — H43813 Vitreous degeneration, bilateral: Secondary | ICD-10-CM | POA: Diagnosis not present

## 2018-10-03 DIAGNOSIS — H35033 Hypertensive retinopathy, bilateral: Secondary | ICD-10-CM | POA: Diagnosis not present

## 2018-10-03 DIAGNOSIS — H469 Unspecified optic neuritis: Secondary | ICD-10-CM | POA: Diagnosis not present

## 2018-10-03 DIAGNOSIS — Z79899 Other long term (current) drug therapy: Secondary | ICD-10-CM | POA: Diagnosis not present

## 2018-10-03 DIAGNOSIS — H268 Other specified cataract: Secondary | ICD-10-CM | POA: Diagnosis not present

## 2018-10-03 DIAGNOSIS — H35342 Macular cyst, hole, or pseudohole, left eye: Secondary | ICD-10-CM | POA: Diagnosis not present

## 2018-10-03 DIAGNOSIS — H2513 Age-related nuclear cataract, bilateral: Secondary | ICD-10-CM | POA: Diagnosis not present

## 2018-10-09 ENCOUNTER — Ambulatory Visit (HOSPITAL_COMMUNITY)
Admission: RE | Admit: 2018-10-09 | Discharge: 2018-10-09 | Disposition: A | Discharge status: Home / Self Care | Payer: Medicare Other | Source: Ambulatory Visit | Attending: Oncology | Admitting: Oncology

## 2018-10-09 ENCOUNTER — Other Ambulatory Visit: Payer: Self-pay

## 2018-10-09 ENCOUNTER — Inpatient Hospital Stay: Payer: Medicare Other | Attending: Oncology

## 2018-10-09 ENCOUNTER — Encounter (HOSPITAL_COMMUNITY): Payer: Self-pay

## 2018-10-09 DIAGNOSIS — R1904 Left lower quadrant abdominal swelling, mass and lump: Secondary | ICD-10-CM | POA: Diagnosis not present

## 2018-10-09 DIAGNOSIS — M8580 Other specified disorders of bone density and structure, unspecified site: Secondary | ICD-10-CM | POA: Insufficient documentation

## 2018-10-09 DIAGNOSIS — Z5112 Encounter for antineoplastic immunotherapy: Secondary | ICD-10-CM | POA: Diagnosis not present

## 2018-10-09 DIAGNOSIS — Z79899 Other long term (current) drug therapy: Secondary | ICD-10-CM | POA: Diagnosis not present

## 2018-10-09 DIAGNOSIS — C8203 Follicular lymphoma grade I, intra-abdominal lymph nodes: Secondary | ICD-10-CM

## 2018-10-09 DIAGNOSIS — C8213 Follicular lymphoma grade II, intra-abdominal lymph nodes: Secondary | ICD-10-CM | POA: Diagnosis not present

## 2018-10-09 LAB — BASIC METABOLIC PANEL - CANCER CENTER ONLY
Anion gap: 12 (ref 5–15)
BUN: 14 mg/dL (ref 8–23)
CO2: 25 mmol/L (ref 22–32)
Calcium: 11.3 mg/dL — ABNORMAL HIGH (ref 8.9–10.3)
Chloride: 102 mmol/L (ref 98–111)
Creatinine: 0.82 mg/dL (ref 0.44–1.00)
GFR, Est AFR Am: >60 mL/min (ref >60)
GFR, Estimated: >60 mL/min (ref >60)
Glucose, Bld: 151 mg/dL — ABNORMAL HIGH (ref 70–99)
Potassium: 4.2 mmol/L (ref 3.5–5.1)
Sodium: 139 mmol/L (ref 135–145)

## 2018-10-09 MED ORDER — IOHEXOL 300 MG/ML  SOLN
30.0000 mL | Freq: Once | INTRAMUSCULAR | Status: AC | PRN
  Administered 2018-10-09: 30 mL via ORAL

## 2018-10-09 MED ORDER — SODIUM CHLORIDE (PF) 0.9 % IJ SOLN
INTRAMUSCULAR | Status: AC
  Filled 2018-10-09: qty 50

## 2018-10-09 MED ORDER — IOHEXOL 300 MG/ML  SOLN
100.0000 mL | Freq: Once | INTRAMUSCULAR | Status: AC | PRN
  Administered 2018-10-09: 100 mL via INTRAVENOUS

## 2018-10-10 LAB — PTH, INTACT AND CALCIUM
Calcium, Total (PTH): 10.8 mg/dL — ABNORMAL HIGH (ref 8.7–10.3)
PTH: 33 pg/mL (ref 15–65)

## 2018-10-16 ENCOUNTER — Telehealth (HOSPITAL_COMMUNITY): Payer: Self-pay | Admitting: *Deleted

## 2018-10-16 NOTE — Telephone Encounter (Signed)
COVID-19 Pre-Screening Questions:  . Do you currently have a fever? NO (yes = cancel and refer to pcp for e-visit) . Have you recently travelled on a cruise, internationally, or to [place], [place], [place], [place], [place], or [place], [place] ([place]) ? NO (yes = cancel, stay home, monitor symptoms, and contact pcp or initiate e-visit if symptoms develop) . Have you been in contact with someone that is currently pending confirmation of Covid19 testing or has been confirmed to have the Covid19 virus?  NO (yes = cancel, stay home, away from tested individual, monitor symptoms, and contact pcp or initiate e-visit if symptoms develop) . Are you currently experiencing fatigue or cough? NO (yes = pt should be prepared to have a mask placed at the time of their visit).   . Reiterated no additional visitors. . Arrive no earlier than 15 minutes before appointment time. . Please bring own mask.  [name] [name] 

## 2018-10-17 ENCOUNTER — Other Ambulatory Visit: Payer: Self-pay | Admitting: Oncology

## 2018-10-17 ENCOUNTER — Other Ambulatory Visit: Payer: Self-pay

## 2018-10-17 ENCOUNTER — Ambulatory Visit (HOSPITAL_COMMUNITY): Payer: Medicare Other | Attending: Internal Medicine

## 2018-10-17 DIAGNOSIS — I4729 Other ventricular tachycardia: Secondary | ICD-10-CM

## 2018-10-17 DIAGNOSIS — I472 Ventricular tachycardia: Secondary | ICD-10-CM | POA: Diagnosis not present

## 2018-10-17 DIAGNOSIS — R002 Palpitations: Secondary | ICD-10-CM

## 2018-10-17 DIAGNOSIS — I341 Nonrheumatic mitral (valve) prolapse: Secondary | ICD-10-CM

## 2018-10-19 ENCOUNTER — Inpatient Hospital Stay: Payer: Medicare Other

## 2018-10-19 ENCOUNTER — Inpatient Hospital Stay (HOSPITAL_BASED_OUTPATIENT_CLINIC_OR_DEPARTMENT_OTHER): Payer: Medicare Other | Admitting: Oncology

## 2018-10-19 ENCOUNTER — Other Ambulatory Visit: Payer: Self-pay

## 2018-10-19 VITALS — BP 122/62 | HR 83 | Temp 98.5°F | Resp 17 | Ht 62.0 in | Wt 131.0 lb

## 2018-10-19 VITALS — BP 121/57 | HR 65 | Resp 18

## 2018-10-19 DIAGNOSIS — Z79899 Other long term (current) drug therapy: Secondary | ICD-10-CM

## 2018-10-19 DIAGNOSIS — C8213 Follicular lymphoma grade II, intra-abdominal lymph nodes: Secondary | ICD-10-CM

## 2018-10-19 DIAGNOSIS — C8203 Follicular lymphoma grade I, intra-abdominal lymph nodes: Secondary | ICD-10-CM

## 2018-10-19 DIAGNOSIS — Z5112 Encounter for antineoplastic immunotherapy: Secondary | ICD-10-CM | POA: Diagnosis not present

## 2018-10-19 DIAGNOSIS — M8580 Other specified disorders of bone density and structure, unspecified site: Secondary | ICD-10-CM | POA: Diagnosis not present

## 2018-10-19 LAB — CBC WITH DIFFERENTIAL (CANCER CENTER ONLY)
Abs Immature Granulocytes: 0.03 10*3/uL (ref 0.00–0.07)
Basophils Absolute: 0 10*3/uL (ref 0.0–0.1)
Basophils Relative: 1 %
Eosinophils Absolute: 0.2 10*3/uL (ref 0.0–0.5)
Eosinophils Relative: 2 %
HCT: 43.2 % (ref 36.0–46.0)
Hemoglobin: 14.1 g/dL (ref 12.0–15.0)
Immature Granulocytes: 0 %
Lymphocytes Relative: 16 %
Lymphs Abs: 1.4 10*3/uL (ref 0.7–4.0)
MCH: 30.3 pg (ref 26.0–34.0)
MCHC: 32.6 g/dL (ref 30.0–36.0)
MCV: 92.9 fL (ref 80.0–100.0)
Monocytes Absolute: 1.2 10*3/uL — ABNORMAL HIGH (ref 0.1–1.0)
Monocytes Relative: 14 %
Neutro Abs: 5.9 10*3/uL (ref 1.7–7.7)
Neutrophils Relative %: 67 %
Platelet Count: 261 10*3/uL (ref 150–400)
RBC: 4.65 MIL/uL (ref 3.87–5.11)
RDW: 13.2 % (ref 11.5–15.5)
WBC Count: 8.7 10*3/uL (ref 4.0–10.5)
nRBC: 0 % (ref 0.0–0.2)

## 2018-10-19 LAB — CMP (CANCER CENTER ONLY)
ALT: 19 U/L (ref 0–44)
AST: 20 U/L (ref 15–41)
Albumin: 4 g/dL (ref 3.5–5.0)
Alkaline Phosphatase: 68 U/L (ref 38–126)
Anion gap: 10 (ref 5–15)
BUN: 14 mg/dL (ref 8–23)
CO2: 24 mmol/L (ref 22–32)
Calcium: 10.4 mg/dL — ABNORMAL HIGH (ref 8.9–10.3)
Chloride: 106 mmol/L (ref 98–111)
Creatinine: 0.8 mg/dL (ref 0.44–1.00)
GFR, Est AFR Am: >60 mL/min (ref >60)
GFR, Estimated: >60 mL/min (ref >60)
Glucose, Bld: 97 mg/dL (ref 70–99)
Potassium: 4.2 mmol/L (ref 3.5–5.1)
Sodium: 140 mmol/L (ref 135–145)
Total Bilirubin: 0.3 mg/dL (ref 0.3–1.2)
Total Protein: 6.6 g/dL (ref 6.5–8.1)

## 2018-10-19 LAB — LACTATE DEHYDROGENASE: LDH: 154 U/L (ref 98–192)

## 2018-10-19 MED ORDER — ACETAMINOPHEN 325 MG PO TABS
ORAL_TABLET | ORAL | Status: AC
  Filled 2018-10-19: qty 2

## 2018-10-19 MED ORDER — SODIUM CHLORIDE 0.9 % IV SOLN
Freq: Once | INTRAVENOUS | Status: AC
  Administered 2018-10-19: 11:00:00 via INTRAVENOUS
  Filled 2018-10-19: qty 250

## 2018-10-19 MED ORDER — ACETAMINOPHEN 325 MG PO TABS
650.0000 mg | ORAL_TABLET | Freq: Once | ORAL | Status: AC
  Administered 2018-10-19: 650 mg via ORAL

## 2018-10-19 MED ORDER — DIPHENHYDRAMINE HCL 25 MG PO CAPS
50.0000 mg | ORAL_CAPSULE | Freq: Once | ORAL | Status: AC
  Administered 2018-10-19: 50 mg via ORAL

## 2018-10-19 MED ORDER — SODIUM CHLORIDE 0.9 % IV SOLN
375.0000 mg/m2 | Freq: Once | INTRAVENOUS | Status: AC
  Administered 2018-10-19: 600 mg via INTRAVENOUS
  Filled 2018-10-19: qty 50

## 2018-10-19 MED ORDER — DIPHENHYDRAMINE HCL 25 MG PO CAPS
ORAL_CAPSULE | ORAL | Status: AC
  Filled 2018-10-19: qty 2

## 2018-10-19 NOTE — Progress Notes (Signed)
  [facility] OFFICE PROGRESS NOTE   Diagnosis: Non-Hodgkin's lymphoma  INTERVAL HISTORY:   [name] [name] completed cycle 4 rituximab on 08/31/2018.  She reports tolerating the rituximab well.  No symptom of an allergic reaction.  The abdominal discomfort has improved.  No new complaint.  Objective:  Vital signs in last 24 hours:  Blood pressure 122/62, pulse 83, temperature 98.5 F (36.9 C), temperature source Oral, resp. rate 17, height 5\' 2"  (1.575 m), weight 131 lb (59.4 kg), SpO2 100 %.    HEENT: Neck without mass Lymphatics: No cervical, supraclavicular, axillary, or inguinal nodes GI: No hepatosplenomegaly, no mass, nontender Vascular: No leg edema   Lab Results:  Lab Results  Component Value Date   WBC 8.7 10/19/2018   HGB 14.1 10/19/2018   HCT 43.2 10/19/2018   MCV 92.9 10/19/2018   PLT 261 10/19/2018   NEUTROABS 5.9 10/19/2018    CMP  Lab Results  Component Value Date   NA 139 10/09/2018   K 4.2 10/09/2018   CL 102 10/09/2018   CO2 25 10/09/2018   GLUCOSE 151 (H) 10/09/2018   BUN 14 10/09/2018   CREATININE 0.82 10/09/2018   CALCIUM 10.8 (H) 10/09/2018   CALCIUM 11.3 (H) 10/09/2018   PROT 6.9 08/14/2018   ALBUMIN 4.0 08/14/2018   AST 22 08/14/2018   ALT 16 08/14/2018   ALKPHOS 69 08/14/2018   BILITOT 0.3 08/14/2018   GFRNONAA >60 10/09/2018   GFRAA >60 10/09/2018    Medications: I have reviewed the patient's current medications.   Assessment/Plan: 1. Non-Hodgkin's lymphoma-follicular lymphoma  Mesenteric lymph node biopsy 09/08/3265- follicular center cell lymphoma, grade 2  Treated with mitoxantrone, fludarabine, and rituximab completed in January 2002  Progressive disease in February 2004, treated with single agent rituximab February 2004 through August 2005  CTs 06/21/2014- no evidence of lymphoma  CT abdomen/pelvis 06/05/2018- enlarging left mid abdominal mesenteric mass  CT biopsy of the mesenteric mass  06/16/2018-follicular lymphoma, low-grade, CD20 positive  Cycle 1 weekly Rituxan 07/24/2018  Cycle 2 weekly Rituxan 07/31/2018   Cycle 3 weekly Rituxan 08/07/2018  Cycle 4 weekly Rituxan 08/14/2018  CT abdomen/pelvis 10/09/2018- reduction in size of 1 4 quadrant mesenteric mass  Cycle 1 maintenance rituximab 10/19/2018 2. Irritable bowel syndrome 3. History of anal incontinence 4. Osteopenia 5. History of colon polyps 6. Weakness, flushing, presyncope and chills during Rituxan infusion 07/24/2018. 7.   Mild hypercalcemia   Disposition: [name] appears well.  The restaging CT confirms improvement in the abdominal mesenteric mass.  I reviewed the CT images with her.  We discussed observation versus maintenance rituximab.  She would like to continue rituximab.  The plan is to begin every 59-month maintenance rituximab today.  The calcium has been mildly elevated on multiple chemistry panels.  The PTH level was not elevated on 10/09/2018.  The etiology of the mild hypercalcemia is unclear.  I doubt the elevated calcium level is related to the low-grade lymphoma.  I cannot relate this to her medical regimen.  The mild hypercalcemia appears chronic.  She is not taking a potassium supplement.  We will observe for now and make an endocrine referral if the calcium level becomes progressively elevated.  She will return for an office visit and rituximab in 2 months.  Betsy Coder, MD  10/19/2018  10:21 AM

## 2018-10-19 NOTE — Patient Instructions (Signed)
[facility] Discharge Instructions for Patients Receiving Chemotherapy  Today you received the following chemotherapy agents: Rituximab   To help prevent nausea and vomiting after your treatment, we encourage you to take your nausea medication  as prescribed.    If you develop nausea and vomiting that is not controlled by your nausea medication, call the clinic.   BELOW ARE SYMPTOMS THAT SHOULD BE REPORTED IMMEDIATELY:  *FEVER GREATER THAN 100.5 F  *CHILLS WITH OR WITHOUT FEVER  NAUSEA AND VOMITING THAT IS NOT CONTROLLED WITH YOUR NAUSEA MEDICATION  *UNUSUAL SHORTNESS OF BREATH  *UNUSUAL BRUISING OR BLEEDING  TENDERNESS IN MOUTH AND THROAT WITH OR WITHOUT PRESENCE OF ULCERS  *URINARY PROBLEMS  *BOWEL PROBLEMS  UNUSUAL RASH Items with * indicate a potential emergency and should be followed up as soon as possible.  Feel free to call the clinic should you have any questions or concerns. The clinic phone number is [phone].  Please show the CHEMO ALERT CARD at check-in to the Emergency Department and triage nurse.   

## 2018-10-23 ENCOUNTER — Telehealth: Payer: Self-pay | Admitting: Oncology

## 2018-10-23 NOTE — Telephone Encounter (Signed)
Called and spoke with patient. Confirmed date and time of appt  ° °

## 2018-10-24 ENCOUNTER — Telehealth: Payer: Self-pay | Admitting: Cardiology

## 2018-10-24 NOTE — Telephone Encounter (Signed)
New Message   Patient is returning call in reference to echo results. She will be back by 3pm.

## 2018-10-24 NOTE — Telephone Encounter (Signed)
Pt has been notified of echo results by phone with verbal understanding. Pt thanked me for the call.  

## 2018-10-25 ENCOUNTER — Telehealth: Payer: Medicare Other | Admitting: Physician Assistant

## 2018-11-08 ENCOUNTER — Other Ambulatory Visit (HOSPITAL_COMMUNITY): Payer: Self-pay | Admitting: Internal Medicine

## 2018-11-08 DIAGNOSIS — M810 Age-related osteoporosis without current pathological fracture: Secondary | ICD-10-CM

## 2018-11-20 ENCOUNTER — Telehealth: Payer: Self-pay | Admitting: Nurse Practitioner

## 2018-11-20 NOTE — Telephone Encounter (Signed)

## 2018-11-20 NOTE — Progress Notes (Signed)
CARDIOLOGY OFFICE NOTE  Date:  11/21/2018    [name] [name] Date of Birth: 01-20-1941 Medical Record #[id number]  PCP:  Mikey Kirschner, MD  Cardiologist:  Meda Coffee    Chief Complaint  Patient presents with  . Palpitations    Follow up visit - seen for Dr. Meda Coffee    History of Present Illness: [name] [name] is a 78 y.o. female who presents today for a work in visit. Seen for Dr. Meda Coffee.   She has a history of MVP, bradycardia - tachycardia syndrome, palpitations, HLD, hypertension, normal coronary arteries and LV function on cath in 10/16/13.She has had NSVT on prior Holter. Echo ok with normal EF and diastolic dysfunction. She has seen EP. She is managed on beta blocker - has not tolerated Metoprolol.  Flecainide would be another medical option in conjunction with her beta blocker if she has bothersome symptoms. Cardiac MRI has been normal. Other issues include non Hodgkin's lymphoma - found in 2001 - this has relapsed - on Rituximab. Normal cath from 2015 noted.   Last seen by Dr. Meda Coffee in March - some chronic palpitations.   The patient does not have symptoms concerning for COVID-19 infection (fever, chills, cough, or new shortness of breath).   Comes in today. Here alone.  Doing ok for the most part. Still endorses fatigue. She walks an hour every day - does ok with this. [place]. She has been widowed for the past 3 years - notes it seems to get harder and not easier. Occasional palpitation that she describes as "jumping" but nothing that sounds too bothersome. She has had a rare sharp shooting fleeting pain - nothing exertional. No CAD per remote cath in 2015.   Past Medical History:  Diagnosis Date  . Anal sphincter incompetence 12/31/2015   Manometry 12/2015  . Arthritis   . Brady-tachy syndrome ([place])   . Chest pain 10/16/13   normal coronary arteries on cath and normal EF  . Colitis, ischemic ([place])   . Colon polyp 07/29/1993   with focal  adenomatous changes  . Diverticulitis   . Diverticulosis of colon   . Dyspepsia   . Dyssynergic defecation 12/31/2015   Anorectal mano 12/2015, also has rectal hypersensitivity  . Fatty liver   . GERD (gastroesophageal reflux disease)   . Heart murmur   . HLD (hyperlipidemia)   . Hypertension   . IBS (irritable bowel syndrome)   . Incontinence, feces   . MVP (mitral valve prolapse)   . nhl dx'd 07/1999/ 06/2018   chemo comp 2001; rituxin comp 2005  . Osteopenia   . Palpitation   . Rectocele   . Rectocele   . Vasovagal syncope     Past Surgical History:  Procedure Laterality Date  . ABDOMINAL HYSTERECTOMY    . ABDOMINAL SURGERY  2001    for non hodgkins lymphoma; lymphoma in mesentary  . ANAL RECTAL MANOMETRY N/A 12/24/2015   Procedure: ANO RECTAL MANOMETRY;  Surgeon: Gatha Mayer, MD;  Location: [facility];  Service: Endoscopy;  Laterality: N/A;  . BLADDER SUSPENSION    . BUNIONECTOMY    . CHOLECYSTECTOMY  2003  . COLONOSCOPY  7/200/, 11/2007   diverticulosis  . FINE NEEDLE ASPIRATION BIOPSY Left 06/16/2018   left mesentery tissue  . LEFT HEART CATHETERIZATION WITH CORONARY ANGIOGRAM N/A 10/16/2013   Procedure: LEFT HEART CATHETERIZATION WITH CORONARY ANGIOGRAM;  Surgeon: Peter M Martinique, MD;  Location: [facility] CATH LAB;  Service:  Cardiovascular;  Laterality: N/A;  . pelvic prolapse repair    . UPPER GASTROINTESTINAL ENDOSCOPY  10/2005   hiatal hernia     Medications: Current Meds  Medication Sig  . ALPRAZolam (XANAX) 0.25 MG tablet Take 1 tablet (0.25 mg total) by mouth daily as needed for anxiety.  Marland Kitchen aspirin EC 81 MG tablet Take 81 mg by mouth daily.  Marland Kitchen atenolol (TENORMIN) 25 MG tablet Take 25 mg by mouth daily.   . fexofenadine (ALLEGRA) 180 MG tablet Take 180 mg by mouth daily as needed for allergies.   . folic acid (FOLVITE) 1 MG tablet Take 1 mg by mouth daily.   . pantoprazole (PROTONIX) 40 MG tablet TAKE ONE (1) TABLET BY MOUTH EVERY DAY BEFORE BREAKFAST  .  Polyvinyl Alcohol-Povidone (REFRESH OP) Apply 1 drop to eye 3 (three) times daily.  . Probiotic Product (ALIGN PO) Take 1 capsule by mouth daily. Alternates between Amelia per PCP orders every few months   . TURMERIC PO Take 1 capsule by mouth daily.   Marland Kitchen UNABLE TO FIND Apply topically.  . Wheat Dextrin (BENEFIBER) POWD Take 1 Dose by mouth daily.     Allergies: Allergies  Allergen Reactions  . Other Anaphylaxis    Other reaction(s): Facial swelling And face swelling And face swelling   . Sulfa Antibiotics Swelling and Anaphylaxis    And face swelling Face swelling   . Sulfonamide Derivatives Swelling    Face swelling   . Glycopyrrolate Other (See Comments)    Unable to urinate Could not urinate after it was given  . Latex Other (See Comments)    Blisters  . Methscopolamine Other (See Comments)    Unable to urinate Unable to urinate  . Ace Inhibitors Hives  . Codeine Nausea Only  . Iodine Hives  . Iohexol Hives     Desc: 50 mg benadryl prior to exam     Social History: The patient  reports that she has quit smoking. She smoked 0.33 packs per day. She has never used smokeless tobacco. She reports current alcohol use. She reports that she does not use drugs.   Family History: The patient's family history includes Colon cancer (age of onset: 63) in her father; Colon polyps in her brother and brother; Prostate cancer in her brother; Stomach cancer in her maternal grandfather and maternal uncle.   Review of Systems: Please see the history of present illness.   All other systems are reviewed and negative.   Physical Exam: VS:  BP 126/72   Pulse 78   Ht 5\' 2"  (1.575 m)   Wt 132 lb 6.4 oz (60.1 kg)   SpO2 95%   BMI 24.22 kg/m  .  BMI Body mass index is 24.22 kg/m.  Wt Readings from Last 3 Encounters:  11/21/18 132 lb 6.4 oz (60.1 kg)  10/19/18 131 lb (59.4 kg)  08/14/18 129 lb 12.8 oz (58.9 kg)    General: Pleasant. Elderly. Alert and in  no acute distress.   HEENT: Normal.  Neck: Supple, no JVD, carotid bruits, or masses noted.  Cardiac: Regular rate and rhythm. No murmurs, rubs, or gallops. No edema.  Respiratory:  Lungs are clear to auscultation bilaterally with normal work of breathing.  GI: Soft and nontender.  MS: No deformity or atrophy. Gait and ROM intact.  Skin: Warm and dry. Color is normal.  Neuro:  Strength and sensation are intact and no gross focal deficits noted.  Psych: Alert, appropriate and with  normal affect.   LABORATORY DATA:  EKG:  EKG is not ordered today.   Lab Results  Component Value Date   WBC 8.7 10/19/2018   HGB 14.1 10/19/2018   HCT 43.2 10/19/2018   PLT 261 10/19/2018   GLUCOSE 97 10/19/2018   CHOL 215 (H) 02/11/2018   TRIG 89 02/11/2018   HDL 53 02/11/2018   LDLCALC 144 (H) 02/11/2018   ALT 19 10/19/2018   AST 20 10/19/2018   NA 140 10/19/2018   K 4.2 10/19/2018   CL 106 10/19/2018   CREATININE 0.80 10/19/2018   BUN 14 10/19/2018   CO2 24 10/19/2018   TSH 2.690 03/07/2015   INR 0.94 06/16/2018     BNP (last 3 results) No results for input(s): BNP in the last 8760 hours.  ProBNP (last 3 results) No results for input(s): PROBNP in the last 8760 hours.   Other Studies Reviewed Today:  Echo Study Conclusions 10/2018  IMPRESSIONS    1. The left ventricle has normal systolic function, with an ejection fraction of 55-60%. The cavity size was normal. Left ventricular diastolic Doppler parameters are consistent with impaired relaxation.  2. The right ventricle has normal systolic function. The cavity was normal. There is no increase in right ventricular wall thickness.  3. Borderline bileaflet prolapse of the mitral valve leaflets with trivial central mitral regurgitation.  4. The aortic valve is tricuspid. Mild sclerosis of the aortic valve. No stenosis of the aortic valve.  5. The aortic root and ascending aorta are normal in size and structure.   30-day  monitor 03/06/2018 sinus bradycardia to sinus tachycardia with 3 episodes of NSVT: 2 5 beats and one 6 beat  Cardiac MRI 03/2018 IMPRESSION: 1. Normal left ventricular size, thickness and systolic function (LVEF =62%). There is paradoxical septal motion. There is no late gadolinium enhancement in the left ventricular myocardium.  2. Normal right ventricular size, thickness and systolic function (LVEF = 63%). There are no regional wall motion abnormalities.  3. Normal left and right atrial size.  4. Normal size of the aortic root, ascending aorta and pulmonary artery.  5. Anterior mitral valve leaflet prolapse with mitral regurgitation. Mild tricuspid regurgitation.  6. Normal pericardium. No pericardial effusion.  Normal cardiac MRI without evidence for inflammatory cardiomyopathy or ARVC.  Electronically Signed By: Ena Dawley On: 03/28/2018 19:07   CARDIAC CATH Final Conclusions 10/2013:   1. Normal coronary anatomy 2. Normal LV function  Recommendations: medical therapy.  Peter Martinique, [place]  10/16/2013, 12:01 PM  MYOVIEW 2015 Overall Impression:  High risk stress nuclear study normal myocardial perfusion but patient had significant EKG changes of ischemia with hypotensive BP response to exercise and transient ischemic dilatation of the LV..  LV Ejection Fraction: 64%.  LV Wall Motion:  NL LV Function; NL Wall Motion  Signed: Fransico Him, MD [facility]  ASSESSMENT & PLan:    1. NSVT/palpitations - on low dose beta blocker - would continue. She does not use caffeine.   2. MVP - mild MR - recent echo reviewed and is stable. Will need to follow.   3. Palpitations - see above.   4. Recurrent non-Hodgkin's lymphoma - on Rituxin - recent scan show good response.   5. HTN - BP is fine - no changes made today.   6. Weak spells - will check TSH - she is quite active - she needs to continue with her walking. Some of this may be  grief related - we talked about  this a great deal today.    7. COVID-19 Education: The signs and symptoms of COVID-19 were discussed with the patient and how to seek care for testing (follow up with PCP or arrange E-visit).  The importance of social distancing, staying at home, hand hygiene and wearing a mask when out in public were discussed today.  Current medicines are reviewed with the patient today.  The patient does not have concerns regarding medicines other than what has been noted above.  The following changes have been made:  See above.  Labs/ tests ordered today include:    Orders Placed This Encounter  Procedures  . TSH     Disposition:   FU with Dr. Meda Coffee in 6 months. I am happy to see back as needed.    Patient is agreeable to this plan and will call if any problems develop in the interim.   SignedTruitt Merle, NP  11/21/2018 10:22 AM  [place] [address] [place], [place]  [address] Phone: [phone] Fax: [phone]

## 2018-11-21 ENCOUNTER — Other Ambulatory Visit: Payer: Self-pay

## 2018-11-21 ENCOUNTER — Encounter: Payer: Self-pay | Admitting: Nurse Practitioner

## 2018-11-21 ENCOUNTER — Ambulatory Visit (INDEPENDENT_AMBULATORY_CARE_PROVIDER_SITE_OTHER): Payer: Medicare Other | Admitting: Nurse Practitioner

## 2018-11-21 VITALS — BP 126/72 | HR 78 | Ht 62.0 in | Wt 132.4 lb

## 2018-11-21 DIAGNOSIS — R002 Palpitations: Secondary | ICD-10-CM

## 2018-11-21 DIAGNOSIS — R5383 Other fatigue: Secondary | ICD-10-CM

## 2018-11-21 DIAGNOSIS — I10 Essential (primary) hypertension: Secondary | ICD-10-CM | POA: Diagnosis not present

## 2018-11-21 DIAGNOSIS — I341 Nonrheumatic mitral (valve) prolapse: Secondary | ICD-10-CM

## 2018-11-21 DIAGNOSIS — I472 Ventricular tachycardia: Secondary | ICD-10-CM | POA: Diagnosis not present

## 2018-11-21 DIAGNOSIS — I4729 Other ventricular tachycardia: Secondary | ICD-10-CM

## 2018-11-21 LAB — TSH: TSH: 3.01 u[IU]/mL (ref 0.450–4.500)

## 2018-11-21 NOTE — Patient Instructions (Addendum)
After Visit Summary:  We will be checking the following labs today - TSH   Medication Instructions:    Continue with your current medicines.    If you need a refill on your cardiac medications before your next appointment, please call your pharmacy.     Testing/Procedures To Be Arranged:  N/A  Follow-Up:   See Dr. Meda Coffee in 6 months  I am happy to see you back if needed.     At [facility], you and your health needs are our priority.  As part of our continuing mission to provide you with exceptional heart care, we have created designated Provider Care Teams.  These Care Teams include your primary Cardiologist (physician) and Advanced Practice Providers (APPs -  Physician Assistants and Nurse Practitioners) who all work together to provide you with the care you need, when you need it.  Special Instructions:  . Stay safe, stay home, wash your hands for at least 20 seconds and wear a mask when out in public.  . It was good to talk with you today.    Call the [place] office at [phone] if you have any questions, problems or concerns.

## 2018-11-30 ENCOUNTER — Ambulatory Visit (HOSPITAL_COMMUNITY)
Admission: RE | Admit: 2018-11-30 | Discharge: 2018-11-30 | Disposition: A | Discharge status: Home / Self Care | Payer: Medicare Other | Source: Ambulatory Visit | Attending: Internal Medicine | Admitting: Internal Medicine

## 2018-11-30 ENCOUNTER — Other Ambulatory Visit: Payer: Self-pay

## 2018-11-30 DIAGNOSIS — M85851 Other specified disorders of bone density and structure, right thigh: Secondary | ICD-10-CM | POA: Diagnosis not present

## 2018-11-30 DIAGNOSIS — M810 Age-related osteoporosis without current pathological fracture: Secondary | ICD-10-CM

## 2018-11-30 DIAGNOSIS — Z780 Asymptomatic menopausal state: Secondary | ICD-10-CM | POA: Diagnosis not present

## 2018-12-14 ENCOUNTER — Inpatient Hospital Stay: Payer: Medicare Other | Attending: Oncology | Admitting: Nurse Practitioner

## 2018-12-14 ENCOUNTER — Inpatient Hospital Stay: Payer: Medicare Other

## 2018-12-14 ENCOUNTER — Other Ambulatory Visit: Payer: Self-pay

## 2018-12-14 ENCOUNTER — Encounter: Payer: Self-pay | Admitting: Nurse Practitioner

## 2018-12-14 VITALS — BP 120/60 | HR 66 | Temp 98.7°F | Resp 18

## 2018-12-14 VITALS — BP 142/59 | HR 74 | Temp 98.3°F | Resp 18 | Wt 134.2 lb

## 2018-12-14 DIAGNOSIS — C8203 Follicular lymphoma grade I, intra-abdominal lymph nodes: Secondary | ICD-10-CM | POA: Diagnosis not present

## 2018-12-14 DIAGNOSIS — Z5112 Encounter for antineoplastic immunotherapy: Secondary | ICD-10-CM | POA: Diagnosis not present

## 2018-12-14 DIAGNOSIS — M8580 Other specified disorders of bone density and structure, unspecified site: Secondary | ICD-10-CM | POA: Insufficient documentation

## 2018-12-14 DIAGNOSIS — R531 Weakness: Secondary | ICD-10-CM | POA: Insufficient documentation

## 2018-12-14 LAB — CMP (CANCER CENTER ONLY)
ALT: 28 U/L (ref 0–44)
AST: 22 U/L (ref 15–41)
Albumin: 3.8 g/dL (ref 3.5–5.0)
Alkaline Phosphatase: 71 U/L (ref 38–126)
Anion gap: 10 (ref 5–15)
BUN: 17 mg/dL (ref 8–23)
CO2: 24 mmol/L (ref 22–32)
Calcium: 10.5 mg/dL — ABNORMAL HIGH (ref 8.9–10.3)
Chloride: 106 mmol/L (ref 98–111)
Creatinine: 0.85 mg/dL (ref 0.44–1.00)
GFR, Est AFR Am: >60 mL/min (ref >60)
GFR, Estimated: >60 mL/min (ref >60)
Glucose, Bld: 94 mg/dL (ref 70–99)
Potassium: 4.3 mmol/L (ref 3.5–5.1)
Sodium: 140 mmol/L (ref 135–145)
Total Bilirubin: 0.4 mg/dL (ref 0.3–1.2)
Total Protein: 6.4 g/dL — ABNORMAL LOW (ref 6.5–8.1)

## 2018-12-14 LAB — CBC WITH DIFFERENTIAL (CANCER CENTER ONLY)
Abs Immature Granulocytes: 0.03 10*3/uL (ref 0.00–0.07)
Basophils Absolute: 0.1 10*3/uL (ref 0.0–0.1)
Basophils Relative: 1 %
Eosinophils Absolute: 0.2 10*3/uL (ref 0.0–0.5)
Eosinophils Relative: 2 %
HCT: 42.3 % (ref 36.0–46.0)
Hemoglobin: 13.7 g/dL (ref 12.0–15.0)
Immature Granulocytes: 0 %
Lymphocytes Relative: 16 %
Lymphs Abs: 1.3 10*3/uL (ref 0.7–4.0)
MCH: 29.6 pg (ref 26.0–34.0)
MCHC: 32.4 g/dL (ref 30.0–36.0)
MCV: 91.4 fL (ref 80.0–100.0)
Monocytes Absolute: 1.1 10*3/uL — ABNORMAL HIGH (ref 0.1–1.0)
Monocytes Relative: 14 %
Neutro Abs: 5.3 10*3/uL (ref 1.7–7.7)
Neutrophils Relative %: 67 %
Platelet Count: 248 10*3/uL (ref 150–400)
RBC: 4.63 MIL/uL (ref 3.87–5.11)
RDW: 12.6 % (ref 11.5–15.5)
WBC Count: 7.9 10*3/uL (ref 4.0–10.5)
nRBC: 0 % (ref 0.0–0.2)

## 2018-12-14 LAB — LACTATE DEHYDROGENASE: LDH: 157 U/L (ref 98–192)

## 2018-12-14 MED ORDER — DIPHENHYDRAMINE HCL 25 MG PO CAPS
50.0000 mg | ORAL_CAPSULE | Freq: Once | ORAL | Status: AC
  Administered 2018-12-14: 11:00:00 50 mg via ORAL

## 2018-12-14 MED ORDER — SODIUM CHLORIDE 0.9 % IV SOLN
375.0000 mg/m2 | Freq: Once | INTRAVENOUS | Status: AC
  Administered 2018-12-14: 600 mg via INTRAVENOUS
  Filled 2018-12-14: qty 10

## 2018-12-14 MED ORDER — SODIUM CHLORIDE 0.9 % IV SOLN
Freq: Once | INTRAVENOUS | Status: AC
  Administered 2018-12-14: 11:00:00 via INTRAVENOUS
  Filled 2018-12-14: qty 250

## 2018-12-14 MED ORDER — ACETAMINOPHEN 325 MG PO TABS
ORAL_TABLET | ORAL | Status: AC
  Filled 2018-12-14: qty 2

## 2018-12-14 MED ORDER — ACETAMINOPHEN 325 MG PO TABS
650.0000 mg | ORAL_TABLET | Freq: Once | ORAL | Status: AC
  Administered 2018-12-14: 11:00:00 650 mg via ORAL

## 2018-12-14 MED ORDER — DIPHENHYDRAMINE HCL 25 MG PO CAPS
ORAL_CAPSULE | ORAL | Status: AC
  Filled 2018-12-14: qty 2

## 2018-12-14 NOTE — Addendum Note (Signed)
Addended by: Betsy Coder B on: 12/14/2018 10:44 AM   Modules accepted: Orders

## 2018-12-14 NOTE — Patient Instructions (Signed)
[facility] Discharge Instructions for Patients Receiving Chemotherapy  Today you received the following chemotherapy agents: Rituximab   To help prevent nausea and vomiting after your treatment, we encourage you to take your nausea medication  as prescribed.    If you develop nausea and vomiting that is not controlled by your nausea medication, call the clinic.   BELOW ARE SYMPTOMS THAT SHOULD BE REPORTED IMMEDIATELY:  *FEVER GREATER THAN 100.5 F  *CHILLS WITH OR WITHOUT FEVER  NAUSEA AND VOMITING THAT IS NOT CONTROLLED WITH YOUR NAUSEA MEDICATION  *UNUSUAL SHORTNESS OF BREATH  *UNUSUAL BRUISING OR BLEEDING  TENDERNESS IN MOUTH AND THROAT WITH OR WITHOUT PRESENCE OF ULCERS  *URINARY PROBLEMS  *BOWEL PROBLEMS  UNUSUAL RASH Items with * indicate a potential emergency and should be followed up as soon as possible.  Feel free to call the clinic should you have any questions or concerns. The clinic phone number is [phone].  Please show the CHEMO ALERT CARD at check-in to the Emergency Department and triage nurse.   

## 2018-12-14 NOTE — Telephone Encounter (Signed)
error 

## 2018-12-14 NOTE — Progress Notes (Signed)
  [place] OFFICE PROGRESS NOTE   Diagnosis: Non-Hodgkin's lymphoma  INTERVAL HISTORY:   [name] [name] returns as scheduled.  She began every 60-month maintenance rituximab 10/19/2018.  She overall feels well.  She denies problems with the Rituxan infusions.  Some days she feels weak, shaky, "no energy".  Symptoms improve after she eats something "sweet".  She denies fever and sweats.  No nausea or vomiting.  No constipation or diarrhea.  Objective:  Vital signs in last 24 hours:  Blood pressure (!) 142/59, pulse 74, temperature 98.3 F (36.8 C), resp. rate 18, weight 134 lb 4 oz (60.9 kg), SpO2 100 %.    HEENT: No thrush or ulcers. Lymphatics: No palpable cervical, supraclavicular, axillary or inguinal lymph nodes. GI: Abdomen soft and nontender.  No hepatomegaly.  No splenomegaly.  No mass. Vascular: No leg edema. Neuro: Alert and oriented. Skin: No rash.   Lab Results:  Lab Results  Component Value Date   WBC 7.9 12/14/2018   HGB 13.7 12/14/2018   HCT 42.3 12/14/2018   MCV 91.4 12/14/2018   PLT 248 12/14/2018   NEUTROABS 5.3 12/14/2018    Imaging:  No results found.  Medications: I have reviewed the patient's current medications.  Assessment/Plan: 1. Non-Hodgkin's lymphoma-follicular lymphoma  Mesenteric lymph node biopsy 6/54/6503- follicular center cell lymphoma, grade 2  Treated with mitoxantrone, fludarabine, and rituximab completed in January 2002  Progressive disease in February 2004, treated with single agent rituximab February 2004 through August 2005  CTs 06/21/2014- no evidence of lymphoma  CT abdomen/pelvis 06/05/2018- enlarging left mid abdominal mesenteric mass  CT biopsy of the mesenteric mass 06/16/2018-follicular lymphoma, low-grade, CD20 positive  Cycle 1 weekly Rituxan 07/24/2018  Cycle 2 weekly Rituxan 07/31/2018   Cycle 3 weekly Rituxan 08/07/2018  Cycle 4 weekly Rituxan 08/14/2018  CT abdomen/pelvis 10/09/2018- reduction in  size of left lower quadrant mesenteric mass  Cycle 1 maintenance rituximab 10/19/2018  Cycle 2 maintenance Rituxan 12/14/2018 2. Irritable bowel syndrome 3. History of anal incontinence 4. Osteopenia 5. History of colon polyps 6. Weakness, flushing, presyncope and chills during Rituxan infusion 07/24/2018. 7. Mild hypercalcemia  Disposition: [name] [name] appears stable.  There is no clinical evidence of disease progression.  Plan to continue maintenance Rituxan every 2 months.  We discussed the possibility of hypoglycemia as etiology of intermittently feeling weak and shaky as symptoms improve if she eats something.  She will follow-up with her PCP.  We reviewed the CBC and chemistry panel from today, adequate for treatment.  She has persistent mild hypercalcemia which is chronic.  Plan continued observation with endocrine referral if there is a progressive rise in the calcium level.  She will return for lab, follow-up, maintenance Rituxan in 2 months.  She will contact the office in the interim with any problems.  Ned Card ANP/GNP-BC   12/14/2018  9:48 AM

## 2018-12-15 ENCOUNTER — Telehealth: Payer: Self-pay | Admitting: Oncology

## 2018-12-15 NOTE — Telephone Encounter (Signed)
Called and spoke with patient. Confirmed Oct. appt  °

## 2018-12-29 DIAGNOSIS — M810 Age-related osteoporosis without current pathological fracture: Secondary | ICD-10-CM | POA: Diagnosis not present

## 2018-12-29 DIAGNOSIS — M150 Primary generalized (osteo)arthritis: Secondary | ICD-10-CM | POA: Diagnosis not present

## 2018-12-29 DIAGNOSIS — M79674 Pain in right toe(s): Secondary | ICD-10-CM | POA: Diagnosis not present

## 2018-12-29 DIAGNOSIS — M2550 Pain in unspecified joint: Secondary | ICD-10-CM | POA: Diagnosis not present

## 2019-01-03 DIAGNOSIS — G245 Blepharospasm: Secondary | ICD-10-CM | POA: Diagnosis not present

## 2019-01-09 DIAGNOSIS — M810 Age-related osteoporosis without current pathological fracture: Secondary | ICD-10-CM | POA: Diagnosis not present

## 2019-01-19 DIAGNOSIS — D1721 Benign lipomatous neoplasm of skin and subcutaneous tissue of right arm: Secondary | ICD-10-CM | POA: Diagnosis not present

## 2019-01-19 DIAGNOSIS — L309 Dermatitis, unspecified: Secondary | ICD-10-CM | POA: Diagnosis not present

## 2019-01-19 DIAGNOSIS — D2272 Melanocytic nevi of left lower limb, including hip: Secondary | ICD-10-CM | POA: Diagnosis not present

## 2019-01-19 DIAGNOSIS — L821 Other seborrheic keratosis: Secondary | ICD-10-CM | POA: Diagnosis not present

## 2019-02-04 ENCOUNTER — Other Ambulatory Visit: Payer: Self-pay | Admitting: Oncology

## 2019-02-06 DIAGNOSIS — H43813 Vitreous degeneration, bilateral: Secondary | ICD-10-CM | POA: Diagnosis not present

## 2019-02-06 DIAGNOSIS — H2513 Age-related nuclear cataract, bilateral: Secondary | ICD-10-CM | POA: Diagnosis not present

## 2019-02-06 DIAGNOSIS — H469 Unspecified optic neuritis: Secondary | ICD-10-CM | POA: Diagnosis not present

## 2019-02-06 DIAGNOSIS — H35033 Hypertensive retinopathy, bilateral: Secondary | ICD-10-CM | POA: Diagnosis not present

## 2019-02-06 DIAGNOSIS — H268 Other specified cataract: Secondary | ICD-10-CM | POA: Diagnosis not present

## 2019-02-06 DIAGNOSIS — Z79899 Other long term (current) drug therapy: Secondary | ICD-10-CM | POA: Diagnosis not present

## 2019-02-06 DIAGNOSIS — H35342 Macular cyst, hole, or pseudohole, left eye: Secondary | ICD-10-CM | POA: Diagnosis not present

## 2019-02-06 DIAGNOSIS — H43392 Other vitreous opacities, left eye: Secondary | ICD-10-CM | POA: Diagnosis not present

## 2019-02-08 ENCOUNTER — Inpatient Hospital Stay: Payer: Medicare Other

## 2019-02-08 ENCOUNTER — Inpatient Hospital Stay: Payer: Medicare Other | Attending: Oncology | Admitting: Oncology

## 2019-02-08 ENCOUNTER — Other Ambulatory Visit: Payer: Self-pay | Admitting: Oncology

## 2019-02-08 ENCOUNTER — Other Ambulatory Visit: Payer: Self-pay

## 2019-02-08 VITALS — BP 148/79 | HR 85 | Temp 98.5°F | Resp 17 | Ht 62.0 in | Wt 134.0 lb

## 2019-02-08 VITALS — BP 133/75 | HR 59 | Temp 98.4°F | Resp 18

## 2019-02-08 DIAGNOSIS — C8203 Follicular lymphoma grade I, intra-abdominal lymph nodes: Secondary | ICD-10-CM

## 2019-02-08 DIAGNOSIS — R109 Unspecified abdominal pain: Secondary | ICD-10-CM | POA: Insufficient documentation

## 2019-02-08 DIAGNOSIS — Z5112 Encounter for antineoplastic immunotherapy: Secondary | ICD-10-CM | POA: Diagnosis not present

## 2019-02-08 DIAGNOSIS — M8580 Other specified disorders of bone density and structure, unspecified site: Secondary | ICD-10-CM | POA: Insufficient documentation

## 2019-02-08 DIAGNOSIS — Z79899 Other long term (current) drug therapy: Secondary | ICD-10-CM | POA: Diagnosis not present

## 2019-02-08 LAB — CMP (CANCER CENTER ONLY)
ALT: 16 U/L (ref 0–44)
AST: 21 U/L (ref 15–41)
Albumin: 4 g/dL (ref 3.5–5.0)
Alkaline Phosphatase: 73 U/L (ref 38–126)
Anion gap: 7 (ref 5–15)
BUN: 14 mg/dL (ref 8–23)
CO2: 27 mmol/L (ref 22–32)
Calcium: 10.6 mg/dL — ABNORMAL HIGH (ref 8.9–10.3)
Chloride: 107 mmol/L (ref 98–111)
Creatinine: 0.79 mg/dL (ref 0.44–1.00)
GFR, Est AFR Am: >60 mL/min (ref >60)
GFR, Estimated: >60 mL/min (ref >60)
Glucose, Bld: 90 mg/dL (ref 70–99)
Potassium: 4.1 mmol/L (ref 3.5–5.1)
Sodium: 141 mmol/L (ref 135–145)
Total Bilirubin: 0.4 mg/dL (ref 0.3–1.2)
Total Protein: 6.5 g/dL (ref 6.5–8.1)

## 2019-02-08 LAB — CBC WITH DIFFERENTIAL (CANCER CENTER ONLY)
Abs Immature Granulocytes: 0.01 10*3/uL (ref 0.00–0.07)
Basophils Absolute: 0.1 10*3/uL (ref 0.0–0.1)
Basophils Relative: 1 %
Eosinophils Absolute: 0.2 10*3/uL (ref 0.0–0.5)
Eosinophils Relative: 3 %
HCT: 42.1 % (ref 36.0–46.0)
Hemoglobin: 13.8 g/dL (ref 12.0–15.0)
Immature Granulocytes: 0 %
Lymphocytes Relative: 19 %
Lymphs Abs: 1.2 10*3/uL (ref 0.7–4.0)
MCH: 29.7 pg (ref 26.0–34.0)
MCHC: 32.8 g/dL (ref 30.0–36.0)
MCV: 90.7 fL (ref 80.0–100.0)
Monocytes Absolute: 1.1 10*3/uL — ABNORMAL HIGH (ref 0.1–1.0)
Monocytes Relative: 16 %
Neutro Abs: 4 10*3/uL (ref 1.7–7.7)
Neutrophils Relative %: 61 %
Platelet Count: 268 10*3/uL (ref 150–400)
RBC: 4.64 MIL/uL (ref 3.87–5.11)
RDW: 12.9 % (ref 11.5–15.5)
WBC Count: 6.6 10*3/uL (ref 4.0–10.5)
nRBC: 0 % (ref 0.0–0.2)

## 2019-02-08 LAB — LACTATE DEHYDROGENASE: LDH: 200 U/L — ABNORMAL HIGH (ref 98–192)

## 2019-02-08 MED ORDER — DIPHENHYDRAMINE HCL 25 MG PO CAPS
ORAL_CAPSULE | ORAL | Status: AC
  Filled 2019-02-08: qty 2

## 2019-02-08 MED ORDER — ACETAMINOPHEN 325 MG PO TABS
650.0000 mg | ORAL_TABLET | Freq: Once | ORAL | Status: AC
  Administered 2019-02-08: 650 mg via ORAL

## 2019-02-08 MED ORDER — DIPHENHYDRAMINE HCL 25 MG PO CAPS
50.0000 mg | ORAL_CAPSULE | Freq: Once | ORAL | Status: AC
  Administered 2019-02-08: 10:00:00 50 mg via ORAL

## 2019-02-08 MED ORDER — ACETAMINOPHEN 325 MG PO TABS
ORAL_TABLET | ORAL | Status: AC
  Filled 2019-02-08: qty 2

## 2019-02-08 MED ORDER — SODIUM CHLORIDE 0.9 % IV SOLN
375.0000 mg/m2 | Freq: Once | INTRAVENOUS | Status: AC
  Administered 2019-02-08: 11:00:00 600 mg via INTRAVENOUS
  Filled 2019-02-08: qty 50

## 2019-02-08 MED ORDER — SODIUM CHLORIDE 0.9 % IV SOLN
Freq: Once | INTRAVENOUS | Status: AC
  Administered 2019-02-08: 10:00:00 via INTRAVENOUS
  Filled 2019-02-08: qty 250

## 2019-02-08 NOTE — Patient Instructions (Signed)
[facility] Discharge Instructions for Patients Receiving Chemotherapy  Today you received the following chemotherapy agents: Rituximab   To help prevent nausea and vomiting after your treatment, we encourage you to take your nausea medication  as prescribed.    If you develop nausea and vomiting that is not controlled by your nausea medication, call the clinic.   BELOW ARE SYMPTOMS THAT SHOULD BE REPORTED IMMEDIATELY:  *FEVER GREATER THAN 100.5 F  *CHILLS WITH OR WITHOUT FEVER  NAUSEA AND VOMITING THAT IS NOT CONTROLLED WITH YOUR NAUSEA MEDICATION  *UNUSUAL SHORTNESS OF BREATH  *UNUSUAL BRUISING OR BLEEDING  TENDERNESS IN MOUTH AND THROAT WITH OR WITHOUT PRESENCE OF ULCERS  *URINARY PROBLEMS  *BOWEL PROBLEMS  UNUSUAL RASH Items with * indicate a potential emergency and should be followed up as soon as possible.  Feel free to call the clinic should you have any questions or concerns. The clinic phone number is [phone].  Please show the CHEMO ALERT CARD at check-in to the Emergency Department and triage nurse.   

## 2019-02-08 NOTE — Progress Notes (Signed)
  [place] OFFICE PROGRESS NOTE   Diagnosis: Non-Hodgkin's lymphoma  INTERVAL HISTORY:   Ms. Ohrt returns as scheduled.  She completed another treatment with rituximab on 12/14/2018.  No symptoms of an allergic reaction.  She occasionally has mild discomfort in the left abdomen, but the pain remains much improved compared to when.  No other complaint.  She has received an influenza vaccine.  Objective:  Vital signs in last 24 hours:  Blood pressure (!) 148/79, pulse 85, temperature 98.5 F (36.9 C), temperature source Temporal, resp. rate 17, height 5\' 2"  (1.575 m), weight 134 lb (60.8 kg), SpO2 100 %.    HEENT: Neck without mass Lymphatics: No cervical, supraclavicular, axillary, or inguinal nodes GI: No hepatosplenomegaly, no mass, mild tenderness in the left mid and upper abdomen Vascular: No leg edema   Lab Results:  Lab Results  Component Value Date   WBC 6.6 02/08/2019   HGB 13.8 02/08/2019   HCT 42.1 02/08/2019   MCV 90.7 02/08/2019   PLT 268 02/08/2019   NEUTROABS 4.0 02/08/2019    CMP  Lab Results  Component Value Date   NA 140 12/14/2018   K 4.3 12/14/2018   CL 106 12/14/2018   CO2 24 12/14/2018   GLUCOSE 94 12/14/2018   BUN 17 12/14/2018   CREATININE 0.85 12/14/2018   CALCIUM 10.5 (H) 12/14/2018   PROT 6.4 (L) 12/14/2018   ALBUMIN 3.8 12/14/2018   AST 22 12/14/2018   ALT 28 12/14/2018   ALKPHOS 71 12/14/2018   BILITOT 0.4 12/14/2018   GFRNONAA >60 12/14/2018   GFRAA >60 12/14/2018     Medications: I have reviewed the patient's current medications.   Assessment/Plan: 1. Non-Hodgkin's lymphoma-follicular lymphoma  Mesenteric lymph node biopsy [id number]- follicular center cell lymphoma, grade 2  Treated with mitoxantrone, fludarabine, and rituximab completed in January 2002  Progressive disease in February 2004, treated with single agent rituximab February 2004 through August 2005  CTs 06/21/2014- no evidence of lymphoma   CT abdomen/pelvis 06/05/2018- enlarging left mid abdominal mesenteric mass  CT biopsy of the mesenteric mass 06/16/2018-follicular lymphoma, low-grade, CD20 positive  Cycle 1 weekly Rituxan 07/24/2018  Cycle 2 weekly Rituxan 07/31/2018   Cycle 3 weekly Rituxan 08/07/2018  Cycle 4 weekly Rituxan 08/14/2018  CT abdomen/pelvis 10/09/2018- reduction in size of left lower quadrant mesenteric mass  Cycle 1 maintenance rituximab 10/19/2018  Cycle 2 maintenance Rituxan 12/14/2018  Cycle 3 maintenance Rituxan 02/08/2019 2. Irritable bowel syndrome 3. History of anal incontinence 4. Osteopenia 5. History of colon polyps 6. Weakness, flushing, presyncope and chills during Rituxan infusion 07/24/2018. 7. Mild hypercalcemia-chronic    Disposition: Ms. Urich appears stable.  She will continue maintenance rituximab.  She will return for an office visit in 2 months.  We will plan for a restaging CT evaluation after the next office visit.  His chronic condition such as a parathyroid adenoma.  I will defer further evaluation to [place].  Betsy Coder, MD  02/08/2019  9:24 AM

## 2019-02-09 ENCOUNTER — Telehealth: Payer: Self-pay | Admitting: Oncology

## 2019-02-09 NOTE — Telephone Encounter (Signed)
Scheduled per los. Called and left msg. Mailed printout  °

## 2019-02-19 ENCOUNTER — Other Ambulatory Visit: Payer: Self-pay | Admitting: Family Medicine

## 2019-02-19 DIAGNOSIS — Z1231 Encounter for screening mammogram for malignant neoplasm of breast: Secondary | ICD-10-CM

## 2019-03-07 ENCOUNTER — Other Ambulatory Visit: Payer: Self-pay | Admitting: Internal Medicine

## 2019-04-04 DIAGNOSIS — G245 Blepharospasm: Secondary | ICD-10-CM | POA: Diagnosis not present

## 2019-04-08 ENCOUNTER — Other Ambulatory Visit: Payer: Self-pay | Admitting: Oncology

## 2019-04-09 ENCOUNTER — Inpatient Hospital Stay: Payer: Medicare Other

## 2019-04-09 ENCOUNTER — Inpatient Hospital Stay: Payer: Medicare Other | Attending: Oncology | Admitting: Nurse Practitioner

## 2019-04-09 ENCOUNTER — Encounter: Payer: Self-pay | Admitting: Nurse Practitioner

## 2019-04-09 ENCOUNTER — Other Ambulatory Visit: Payer: Self-pay

## 2019-04-09 VITALS — BP 131/61 | HR 67 | Temp 99.3°F | Resp 16

## 2019-04-09 VITALS — BP 129/66 | HR 80 | Temp 98.5°F | Resp 18 | Ht 62.0 in | Wt 135.5 lb

## 2019-04-09 DIAGNOSIS — R109 Unspecified abdominal pain: Secondary | ICD-10-CM | POA: Insufficient documentation

## 2019-04-09 DIAGNOSIS — Z5112 Encounter for antineoplastic immunotherapy: Secondary | ICD-10-CM | POA: Diagnosis present

## 2019-04-09 DIAGNOSIS — C8203 Follicular lymphoma grade I, intra-abdominal lymph nodes: Secondary | ICD-10-CM

## 2019-04-09 DIAGNOSIS — M8580 Other specified disorders of bone density and structure, unspecified site: Secondary | ICD-10-CM | POA: Insufficient documentation

## 2019-04-09 DIAGNOSIS — C8213 Follicular lymphoma grade II, intra-abdominal lymph nodes: Secondary | ICD-10-CM | POA: Diagnosis not present

## 2019-04-09 DIAGNOSIS — Z79899 Other long term (current) drug therapy: Secondary | ICD-10-CM | POA: Insufficient documentation

## 2019-04-09 LAB — CBC WITH DIFFERENTIAL (CANCER CENTER ONLY)
Abs Immature Granulocytes: 0.02 10*3/uL (ref 0.00–0.07)
Basophils Absolute: 0.1 10*3/uL (ref 0.0–0.1)
Basophils Relative: 1 %
Eosinophils Absolute: 0.1 10*3/uL (ref 0.0–0.5)
Eosinophils Relative: 2 %
HCT: 41.4 % (ref 36.0–46.0)
Hemoglobin: 13.7 g/dL (ref 12.0–15.0)
Immature Granulocytes: 0 %
Lymphocytes Relative: 12 %
Lymphs Abs: 1 10*3/uL (ref 0.7–4.0)
MCH: 29.9 pg (ref 26.0–34.0)
MCHC: 33.1 g/dL (ref 30.0–36.0)
MCV: 90.4 fL (ref 80.0–100.0)
Monocytes Absolute: 0.9 10*3/uL (ref 0.1–1.0)
Monocytes Relative: 12 %
Neutro Abs: 5.9 10*3/uL (ref 1.7–7.7)
Neutrophils Relative %: 73 %
Platelet Count: 251 10*3/uL (ref 150–400)
RBC: 4.58 MIL/uL (ref 3.87–5.11)
RDW: 12.8 % (ref 11.5–15.5)
WBC Count: 8.1 10*3/uL (ref 4.0–10.5)
nRBC: 0 % (ref 0.0–0.2)

## 2019-04-09 LAB — CMP (CANCER CENTER ONLY)
ALT: 18 U/L (ref 0–44)
AST: 19 U/L (ref 15–41)
Albumin: 3.8 g/dL (ref 3.5–5.0)
Alkaline Phosphatase: 66 U/L (ref 38–126)
Anion gap: 7 (ref 5–15)
BUN: 13 mg/dL (ref 8–23)
CO2: 26 mmol/L (ref 22–32)
Calcium: 10 mg/dL (ref 8.9–10.3)
Chloride: 108 mmol/L (ref 98–111)
Creatinine: 0.77 mg/dL (ref 0.44–1.00)
GFR, Est AFR Am: >60 mL/min (ref >60)
GFR, Estimated: >60 mL/min (ref >60)
Glucose, Bld: 96 mg/dL (ref 70–99)
Potassium: 4.4 mmol/L (ref 3.5–5.1)
Sodium: 141 mmol/L (ref 135–145)
Total Bilirubin: <0.2 mg/dL — ABNORMAL LOW (ref 0.3–1.2)
Total Protein: 6.1 g/dL — ABNORMAL LOW (ref 6.5–8.1)

## 2019-04-09 LAB — LACTATE DEHYDROGENASE: LDH: 169 U/L (ref 98–192)

## 2019-04-09 MED ORDER — ACETAMINOPHEN 325 MG PO TABS
ORAL_TABLET | ORAL | Status: AC
  Filled 2019-04-09: qty 2

## 2019-04-09 MED ORDER — SODIUM CHLORIDE 0.9 % IV SOLN
Freq: Once | INTRAVENOUS | Status: AC
  Administered 2019-04-09: 11:00:00 via INTRAVENOUS
  Filled 2019-04-09: qty 250

## 2019-04-09 MED ORDER — PREDNISONE 50 MG PO TABS: ORAL_TABLET | ORAL | 0 refills | Status: DC

## 2019-04-09 MED ORDER — ACETAMINOPHEN 325 MG PO TABS
650.0000 mg | ORAL_TABLET | Freq: Once | ORAL | Status: AC
  Administered 2019-04-09: 650 mg via ORAL

## 2019-04-09 MED ORDER — DIPHENHYDRAMINE HCL 25 MG PO CAPS
ORAL_CAPSULE | ORAL | Status: AC
  Filled 2019-04-09: qty 2

## 2019-04-09 MED ORDER — SODIUM CHLORIDE 0.9 % IV SOLN
375.0000 mg/m2 | Freq: Once | INTRAVENOUS | Status: AC
  Administered 2019-04-09: 600 mg via INTRAVENOUS
  Filled 2019-04-09: qty 10

## 2019-04-09 MED ORDER — DIPHENHYDRAMINE HCL 25 MG PO CAPS
50.0000 mg | ORAL_CAPSULE | Freq: Once | ORAL | Status: AC
  Administered 2019-04-09: 50 mg via ORAL

## 2019-04-09 NOTE — Patient Instructions (Signed)
[facility] Discharge Instructions for Patients Receiving Chemotherapy  Today you received the following chemotherapy agents: Rituximab   To help prevent nausea and vomiting after your treatment, we encourage you to take your nausea medication  as prescribed.    If you develop nausea and vomiting that is not controlled by your nausea medication, call the clinic.   BELOW ARE SYMPTOMS THAT SHOULD BE REPORTED IMMEDIATELY:  *FEVER GREATER THAN 100.5 F  *CHILLS WITH OR WITHOUT FEVER  NAUSEA AND VOMITING THAT IS NOT CONTROLLED WITH YOUR NAUSEA MEDICATION  *UNUSUAL SHORTNESS OF BREATH  *UNUSUAL BRUISING OR BLEEDING  TENDERNESS IN MOUTH AND THROAT WITH OR WITHOUT PRESENCE OF ULCERS  *URINARY PROBLEMS  *BOWEL PROBLEMS  UNUSUAL RASH Items with * indicate a potential emergency and should be followed up as soon as possible.  Feel free to call the clinic should you have any questions or concerns. The clinic phone number is [phone].  Please show the CHEMO ALERT CARD at check-in to the Emergency Department and triage nurse.   

## 2019-04-09 NOTE — Progress Notes (Signed)
  [place] OFFICE PROGRESS NOTE   Diagnosis: Non-Hodgkin's lymphoma  INTERVAL HISTORY:   [name] [name] returns as scheduled.  She completed a cycle of maintenance Rituxan 02/08/2019.  She feels well overall.  She has an occasional "twinge" of left-sided abdominal pain.  No fevers or sweats.  She has a good appetite.  No nausea or vomiting.  Bowels moving regularly.  No bleeding.  Objective:  Vital signs in last 24 hours:  Blood pressure 129/66, pulse 80, temperature 98.5 F (36.9 C), temperature source Temporal, resp. rate 18, height 5\' 2"  (1.575 m), weight 135 lb 8 oz (61.5 kg), SpO2 100 %.    HEENT: Neck without mass. Lymphatics: No palpable cervical, supraclavicular, axillary or inguinal lymph nodes. Resp: Lungs clear bilaterally. Cardio: Regular rate and rhythm. GI: Abdomen is soft.  Mild tenderness left mid to upper abdomen.  No hepatosplenomegaly. Vascular: No leg edema.  Skin: No rash.   Lab Results:  Lab Results  Component Value Date   WBC 8.1 04/09/2019   HGB 13.7 04/09/2019   HCT 41.4 04/09/2019   MCV 90.4 04/09/2019   PLT 251 04/09/2019   NEUTROABS 5.9 04/09/2019    Imaging:  No results found.  Medications: I have reviewed the patient's current medications.  Assessment/Plan: 1. Non-Hodgkin's lymphoma-follicular lymphoma  Mesenteric lymph node biopsy 07/15/1999-follicular center cell lymphoma, grade 2  Treated with mitoxantrone, fludarabine, and rituximab completed in January 2002  Progressive disease in February 2004, treated with single agent rituximab February 2004 through August 2005  CTs 06/21/2014-no evidence of lymphoma  CT abdomen/pelvis 06/05/2018-enlarging left mid abdominal mesenteric mass  CT biopsy of the mesenteric mass 06/16/2018-follicular lymphoma, low-grade, CD20 positive  Cycle 1 weekly Rituxan 07/24/2018  Cycle 2 weekly Rituxan 07/31/2018  Cycle 3 weekly Rituxan 08/07/2018  Cycle 4 weekly Rituxan 08/14/2018  CT  abdomen/pelvis 10/09/2018-reduction in size of left lower quadrant mesenteric mass  Cycle 1 maintenance rituximab 10/19/2018  Cycle 2 maintenance Rituxan 12/14/2018  Cycle 3 maintenance Rituxan 02/08/2019  Cycle 4 maintenance Rituxan 04/09/2019 2. Irritable bowel syndrome 3. History of anal incontinence 4. Osteopenia 5. History of colon polyps 6. Weakness, flushing, presyncope and chills during Rituxan infusion 07/24/2018. 7. Mild hypercalcemia-chronic   Disposition: [name] [name] appears stable.  She will continue maintenance Rituxan, cycle 4 today.  She will undergo restaging CTs prior to her next visit.  We reviewed the CBC from today.  Counts adequate to proceed with treatment.  She will return for lab, follow-up, Rituxan in 8 weeks.  She will contact the office in the interim with any problems.    [name] [name] ANP/GNP-BC   04/09/2019  9:46 AM

## 2019-04-10 ENCOUNTER — Other Ambulatory Visit: Payer: Self-pay | Admitting: *Deleted

## 2019-04-10 ENCOUNTER — Telehealth: Payer: Self-pay | Admitting: Oncology

## 2019-04-10 ENCOUNTER — Telehealth: Payer: Self-pay | Admitting: *Deleted

## 2019-04-10 DIAGNOSIS — C8203 Follicular lymphoma grade I, intra-abdominal lymph nodes: Secondary | ICD-10-CM

## 2019-04-10 NOTE — Telephone Encounter (Signed)
Was informed by radiology that she needs repeat lab for her CT scan on 1828/20 (within 6 weeks). Sent scheduling message for lab appointment.

## 2019-04-10 NOTE — Telephone Encounter (Signed)
I talk with patient regarding schedule  

## 2019-04-12 ENCOUNTER — Ambulatory Visit
Admission: RE | Admit: 2019-04-12 | Discharge: 2019-04-12 | Disposition: A | Discharge status: Home / Self Care | Payer: Medicare Other | Source: Ambulatory Visit | Attending: Family Medicine | Admitting: Family Medicine

## 2019-04-12 ENCOUNTER — Other Ambulatory Visit: Payer: Self-pay

## 2019-04-12 DIAGNOSIS — Z1231 Encounter for screening mammogram for malignant neoplasm of breast: Secondary | ICD-10-CM

## 2019-05-01 ENCOUNTER — Other Ambulatory Visit: Payer: Self-pay | Admitting: Physician Assistant

## 2019-05-09 ENCOUNTER — Telehealth: Payer: Self-pay | Admitting: Oncology

## 2019-05-09 NOTE — Telephone Encounter (Signed)
Patient called regarding voicemail that was left, rescheduled 01/28 lab appointment to 01/27 per patient's request.

## 2019-05-18 DIAGNOSIS — Z23 Encounter for immunization: Secondary | ICD-10-CM | POA: Diagnosis not present

## 2019-05-18 NOTE — Progress Notes (Signed)
CARDIOLOGY OFFICE NOTE  Date:  05/21/2019    [name] [name] Date of Birth: Nov 19, 1940 Medical Record [id number]  PCP:  Mikey Kirschner, MD  Cardiologist:  Meda Coffee  Chief Complaint  Patient presents with  . Follow-up    History of Present Illness: [name] [name] is a 79 y.o. female who presents today for a 6 month check.  Seen for Dr. Meda Coffee.   She has a history of MVP, bradycardia - tachycardia syndrome, palpitations, HLD, hypertension, normal coronary arteries and LV function on cath in 10/16/13.She has had NSVT on prior Holter. Echo ok with normal EF and diastolic dysfunction. She has seen EP. She is managed on beta blocker - has not tolerated Metoprolol in the past.  Flecainide would be another medical option in conjunction with her beta blocker if she were to have bothersome symptoms.Cardiac MRI has been normal. Other issues include non Hodgkin's lymphoma - found in 2001 - this has relapsed - on Rituximab. Normal cath from 2015 noted.   Last seen by Dr. Meda Coffee in March of 2020 - some chronic palpitations. 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   The patient does not have symptoms concerning for COVID-19 infection (fever, chills, cough, or new shortness of breath).   Comes in today. Here alone. She feels like she is doing well. She will still have some palpitations - probably more so when she is at rest. No chest pain. Breathing is fine. Not dizzy or lightheaded. She feels like her palpitations are certainly no worse - but not better. Labs from last month noted. She is walking 1 to 2 miles every day - if she can't get outside - she walks in her basement. 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 She is for scans later this month for her lymphoma. Her last echo was last summer - EF is normal - trivial MR noted. She had her first COVID vaccine this past Friday.   Past Medical  History:  Diagnosis Date  . Anal sphincter incompetence 12/31/2015   Manometry 12/2015  . Arthritis   . Brady-tachy syndrome ([place])   . Chest pain 10/16/13   normal coronary arteries on cath and normal EF  . Colitis, ischemic ([place])   . Colon polyp 07/29/1993   with focal adenomatous changes  . Diverticulitis   . Diverticulosis of colon   . Dyspepsia   . Dyssynergic defecation 12/31/2015   Anorectal mano 12/2015, also has rectal hypersensitivity  . Fatty liver   . GERD (gastroesophageal reflux disease)   . Heart murmur   . HLD (hyperlipidemia)   . Hypertension   . IBS (irritable bowel syndrome)   . Incontinence, feces   . MVP (mitral valve prolapse)   . nhl dx'd 07/1999/ 06/2018   chemo comp 2001; rituxin comp 2005  . Osteopenia   . Palpitation   . Rectocele   . Rectocele   . Vasovagal syncope     Past Surgical History:  Procedure Laterality Date  . ABDOMINAL HYSTERECTOMY    . ABDOMINAL SURGERY  2001    for non hodgkins lymphoma; lymphoma in mesentary  . ANAL RECTAL MANOMETRY N/A 12/24/2015   Procedure: ANO RECTAL MANOMETRY;  Surgeon: Gatha Mayer, MD;  Location: [facility];  Service: Endoscopy;  Laterality: N/A;  . BLADDER SUSPENSION    . BUNIONECTOMY    .  CHOLECYSTECTOMY  2003  . COLONOSCOPY  7/200/, 11/2007   diverticulosis  . FINE NEEDLE ASPIRATION BIOPSY Left 06/16/2018   left mesentery tissue  . LEFT HEART CATHETERIZATION WITH CORONARY ANGIOGRAM N/A 10/16/2013   Procedure: LEFT HEART CATHETERIZATION WITH CORONARY ANGIOGRAM;  Surgeon: Peter M Martinique, MD;  Location: [facility] CATH LAB;  Service: Cardiovascular;  Laterality: N/A;  . pelvic prolapse repair    . UPPER GASTROINTESTINAL ENDOSCOPY  10/2005   hiatal hernia     Medications: Current Meds  Medication Sig  . ALPRAZolam (XANAX) 0.25 MG tablet Take 1 tablet (0.25 mg total) by mouth daily as needed for anxiety.  Marland Kitchen aspirin EC 81 MG tablet Take 81 mg by mouth daily.  Marland Kitchen atenolol (TENORMIN) 25 MG tablet Take 25 mg in the  AM and 12.5 mg in the PM  . fexofenadine (ALLEGRA) 180 MG tablet Take 180 mg by mouth daily as needed for allergies.   . folic acid (FOLVITE) 1 MG tablet Take 1 mg by mouth daily.   . pantoprazole (PROTONIX) 40 MG tablet TAKE ONE (1) TABLET BY MOUTH EVERY DAY BEFORE BREAKFAST  . Polyvinyl Alcohol-Povidone (REFRESH OP) Apply 1 drop to eye 3 (three) times daily.  . Probiotic Product (ALIGN PO) Take 1 capsule by mouth daily. Alternates between [place] per PCP orders every few months   . TURMERIC PO Take 1 capsule by mouth daily.   Marland Kitchen UNABLE TO FIND Apply topically.  . Wheat Dextrin (BENEFIBER) POWD Take 1 Dose by mouth daily.  . [DISCONTINUED] atenolol (TENORMIN) 25 MG tablet TAKE 1.5 TABLETS (37.5MG  TOTAL) BY MOUTHDAILY.  . [DISCONTINUED] predniSONE (DELTASONE) 50 MG tablet Take at 13 hours, 7 hours, and 1 hour prior to scan     Allergies: Allergies  Allergen Reactions  . Other Anaphylaxis    Other reaction(s): Facial swelling And face swelling And face swelling   . Sulfa Antibiotics Swelling and Anaphylaxis    And face swelling Face swelling   . Sulfonamide Derivatives Swelling    Face swelling   . Glycopyrrolate Other (See Comments)    Unable to urinate Could not urinate after it was given  . Latex Other (See Comments)    Blisters  . Methscopolamine Other (See Comments)    Unable to urinate Unable to urinate  . Ace Inhibitors Hives  . Codeine Nausea Only  . Iodine Hives  . Iohexol Hives     Desc: 50 mg benadryl prior to exam     Social History: The patient  reports that she has quit smoking. She smoked 0.33 packs per day. She has never used smokeless tobacco. She reports current alcohol use. She reports that she does not use drugs.   Family History: The patient's family history includes Colon cancer (age of onset: 37) in her father; Colon polyps in her brother and brother; Prostate cancer in her brother; Stomach cancer in her maternal grandfather  and maternal uncle.   Review of Systems: Please see the history of present illness.   All other systems are reviewed and negative.   Physical Exam: VS:  BP 108/70   Pulse 80   Ht 5\' 2"  (1.575 m)   Wt 132 lb (59.9 kg)   SpO2 98%   BMI 24.14 kg/m  .  BMI Body mass index is 24.14 kg/m.  Wt Readings from Last 3 Encounters:  05/21/19 132 lb (59.9 kg)  04/09/19 135 lb 8 oz (61.5 kg)  02/08/19 134 lb (60.8 kg)  General: Pleasant. Well developed, well nourished and in no acute distress. She looks younger than her stated age.  HEENT: Normal.  Neck: Supple, no JVD, carotid bruits, or masses noted.  Cardiac: Regular rate and rhythm. No murmurs, rubs, or gallops. No edema.  Respiratory:  Lungs are clear to auscultation bilaterally with normal work of breathing.  GI: Soft and nontender.  MS: No deformity or atrophy. Gait and ROM intact.  Skin: Warm and dry. Color is normal.  Neuro:  Strength and sensation are intact and no gross focal deficits noted.  Psych: Alert, appropriate and with normal affect.   LABORATORY DATA:  EKG:  EKG is not ordered today.  Lab Results  Component Value Date   WBC 8.1 04/09/2019   HGB 13.7 04/09/2019   HCT 41.4 04/09/2019   PLT 251 04/09/2019   GLUCOSE 96 04/09/2019   CHOL 215 (H) 02/11/2018   TRIG 89 02/11/2018   HDL 53 02/11/2018   LDLCALC 144 (H) 02/11/2018   ALT 18 04/09/2019   AST 19 04/09/2019   NA 141 04/09/2019   K 4.4 04/09/2019   CL 108 04/09/2019   CREATININE 0.77 04/09/2019   BUN 13 04/09/2019   CO2 26 04/09/2019   TSH 3.010 11/21/2018   INR 0.94 06/16/2018     BNP (last 3 results) No results for input(s): BNP in the last 8760 hours.  ProBNP (last 3 results) No results for input(s): PROBNP in the last 8760 hours.   Other Studies Reviewed Today:  Echo Study Conclusions 10/2018  IMPRESSIONS 1. The left ventricle has normal systolic function, with an ejection fraction of 55-60%. The cavity size was normal. Left  ventricular diastolic Doppler parameters are consistent with impaired relaxation. 2. The right ventricle has normal systolic function. The cavity was normal. There is no increase in right ventricular wall thickness. 3. Borderline bileaflet prolapse of the mitral valve leaflets with trivial central mitral regurgitation. 4. The aortic valve is tricuspid. Mild sclerosis of the aortic valve. No stenosis of the aortic valve. 5. The aortic root and ascending aorta are normal in size and structure.   30-day monitor 11/4/2019sinus bradycardia to sinus tachycardia with 3 episodes of NSVT: 2 5 beats and one 6 beat  Cardiac MRI 03/2018 IMPRESSION: 1. Normal left ventricular size, thickness and systolic function (LVEF [id number]). There is paradoxical septal motion. There is no late gadolinium enhancement in the left ventricular myocardium.  2. Normal right ventricular size, thickness and systolic function (LVEF = 63%). There are no regional wall motion abnormalities.  3. Normal left and right atrial size.  4. Normal size of the aortic root, ascending aorta and pulmonary artery.  5. Anterior mitral valve leaflet prolapse with mitral regurgitation. Mild tricuspid regurgitation.  6. Normal pericardium. No pericardial effusion.  Normal cardiac MRI without evidence for inflammatory cardiomyopathy or ARVC.  Electronically Signed By: Ena Dawley On: 03/28/2018 19:07   CARDIAC CATH Final Conclusions 10/2013:  1. Normal coronary anatomy 2. Normal LV function  Recommendations:medical therapy.  Peter Martinique, [place]  10/16/2013, 12:01 PM  MYOVIEW 2015 Overall Impression: High risk stress nuclear study normal myocardial perfusion but patient had significant EKG changes of ischemia with hypotensive BP response to exercise and transient ischemic dilatation of the LV..  LV Ejection Fraction: 64%. LV Wall Motion: NL LV Function; NL Wall Motion  Signed: Fransico Him, MD [facility]  ASSESSMENT & PLan:   1. NSVT/chronic palpitations - no real change - she is agreeable to trying to split dose  her Atenolol - would also consider changing to Lopressor and 3rd option would be to start AAD therapy. Otherwise, no change in therapy - continue with regular exercise.   2. MVP - mild MR per prior echo - would follow.   3. Recurrent non-Hodgkin's lymphoma - per Oncology.   4. HTN - BP is good - little soft - she is going to monitor at home.   5. Situational stress - much better. 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   6. COVID-19 Education: The signs and symptoms of COVID-19 were discussed with the patient and how to seek care for testing (follow up with PCP or arrange E-visit).  The importance of social distancing, staying at home, hand hygiene and wearing a mask when out in public were discussed today.  Current medicines are reviewed with the patient today.  The patient does not have concerns regarding medicines other than what has been noted above.  The following changes have been made:  See above.  Labs/ tests ordered today include:   No orders of the defined types were placed in this encounter.    Disposition:   FU with me in 6 months.   Patient is agreeable to this plan and will call if any problems develop in the interim.   SignedTruitt Merle, NP  05/21/2019 8:22 AM  [facility] [address] [place], [place]  [address] Phone: [phone] Fax: [phone]

## 2019-05-21 ENCOUNTER — Encounter: Payer: Self-pay | Admitting: Nurse Practitioner

## 2019-05-21 ENCOUNTER — Ambulatory Visit (INDEPENDENT_AMBULATORY_CARE_PROVIDER_SITE_OTHER): Payer: Medicare Other | Admitting: Nurse Practitioner

## 2019-05-21 ENCOUNTER — Other Ambulatory Visit: Payer: Self-pay

## 2019-05-21 VITALS — BP 108/70 | HR 80 | Ht 62.0 in | Wt 132.0 lb

## 2019-05-21 DIAGNOSIS — I341 Nonrheumatic mitral (valve) prolapse: Secondary | ICD-10-CM

## 2019-05-21 DIAGNOSIS — I10 Essential (primary) hypertension: Secondary | ICD-10-CM

## 2019-05-21 DIAGNOSIS — R002 Palpitations: Secondary | ICD-10-CM

## 2019-05-21 DIAGNOSIS — Z7189 Other specified counseling: Secondary | ICD-10-CM

## 2019-05-21 MED ORDER — ATENOLOL 25 MG PO TABS: ORAL_TABLET | ORAL | 11 refills | Status: DC

## 2019-05-21 NOTE — Patient Instructions (Addendum)
After Visit Summary:  We will be checking the following labs today - NONE   Medication Instructions:    Continue with your current medicines. BUT   It is ok to change your Atenolol to a whole tablet in the AM; half at night - ok to try the reverse of this as well. For now we will not increase the dose - just split it up.     If you need a refill on your cardiac medications before your next appointment, please call your pharmacy.     Testing/Procedures To Be Arranged:  N/A  Follow-Up:   See me in 6 months.     At [facility], you and your health needs are our priority.  As part of our continuing mission to provide you with exceptional heart care, we have created designated Provider Care Teams.  These Care Teams include your primary Cardiologist (physician) and Advanced Practice Providers (APPs -  Physician Assistants and Nurse Practitioners) who all work together to provide you with the care you need, when you need it.  Special Instructions:  . Stay safe, stay home, wash your hands for at least 20 seconds and wear a mask when out in public.  . It was good to talk with you today.  Marland Kitchen Keep up the walking.  . We will try to give you a BP cuff. Monitor your BP at home.    Call the [place] office at [phone] if you have any questions, problems or concerns.

## 2019-05-30 ENCOUNTER — Inpatient Hospital Stay: Payer: Medicare Other | Attending: Oncology

## 2019-05-30 ENCOUNTER — Other Ambulatory Visit: Payer: Self-pay

## 2019-05-30 DIAGNOSIS — C8203 Follicular lymphoma grade I, intra-abdominal lymph nodes: Secondary | ICD-10-CM | POA: Diagnosis not present

## 2019-05-30 LAB — BASIC METABOLIC PANEL - CANCER CENTER ONLY
Anion gap: 8 (ref 5–15)
BUN: 11 mg/dL (ref 8–23)
CO2: 26 mmol/L (ref 22–32)
Calcium: 10.1 mg/dL (ref 8.9–10.3)
Chloride: 108 mmol/L (ref 98–111)
Creatinine: 0.76 mg/dL (ref 0.44–1.00)
GFR, Est AFR Am: >60 mL/min (ref >60)
GFR, Estimated: >60 mL/min (ref >60)
Glucose, Bld: 93 mg/dL (ref 70–99)
Potassium: 4.2 mmol/L (ref 3.5–5.1)
Sodium: 142 mmol/L (ref 135–145)

## 2019-05-31 ENCOUNTER — Ambulatory Visit (HOSPITAL_COMMUNITY)
Admission: RE | Admit: 2019-05-31 | Discharge: 2019-05-31 | Disposition: A | Discharge status: Home / Self Care | Payer: Medicare Other | Source: Ambulatory Visit | Attending: Nurse Practitioner | Admitting: Nurse Practitioner

## 2019-05-31 ENCOUNTER — Other Ambulatory Visit: Payer: Medicare Other

## 2019-05-31 ENCOUNTER — Ambulatory Visit (HOSPITAL_COMMUNITY): Payer: Medicare Other

## 2019-05-31 DIAGNOSIS — C8203 Follicular lymphoma grade I, intra-abdominal lymph nodes: Secondary | ICD-10-CM | POA: Insufficient documentation

## 2019-05-31 DIAGNOSIS — C8590 Non-Hodgkin lymphoma, unspecified, unspecified site: Secondary | ICD-10-CM | POA: Diagnosis not present

## 2019-05-31 MED ORDER — SODIUM CHLORIDE (PF) 0.9 % IJ SOLN
INTRAMUSCULAR | Status: AC
  Filled 2019-05-31: qty 50

## 2019-05-31 MED ORDER — IOHEXOL 9 MG/ML PO SOLN
ORAL | Status: AC
  Filled 2019-05-31: qty 500

## 2019-05-31 MED ORDER — IOHEXOL 300 MG/ML  SOLN
100.0000 mL | Freq: Once | INTRAMUSCULAR | Status: AC | PRN
  Administered 2019-05-31: 100 mL via INTRAVENOUS

## 2019-06-03 ENCOUNTER — Other Ambulatory Visit: Payer: Self-pay | Admitting: Oncology

## 2019-06-07 ENCOUNTER — Inpatient Hospital Stay (HOSPITAL_BASED_OUTPATIENT_CLINIC_OR_DEPARTMENT_OTHER): Payer: Medicare Other | Admitting: Oncology

## 2019-06-07 ENCOUNTER — Inpatient Hospital Stay: Payer: Medicare Other | Attending: Oncology

## 2019-06-07 ENCOUNTER — Encounter: Payer: Self-pay | Admitting: Family Medicine

## 2019-06-07 ENCOUNTER — Other Ambulatory Visit: Payer: Self-pay

## 2019-06-07 ENCOUNTER — Inpatient Hospital Stay: Payer: Medicare Other

## 2019-06-07 VITALS — BP 100/55 | HR 69 | Temp 98.4°F | Resp 16

## 2019-06-07 VITALS — BP 147/72 | HR 77 | Temp 97.8°F | Resp 18 | Ht 62.0 in | Wt 133.4 lb

## 2019-06-07 DIAGNOSIS — K589 Irritable bowel syndrome without diarrhea: Secondary | ICD-10-CM | POA: Insufficient documentation

## 2019-06-07 DIAGNOSIS — Z5112 Encounter for antineoplastic immunotherapy: Secondary | ICD-10-CM | POA: Insufficient documentation

## 2019-06-07 DIAGNOSIS — C8203 Follicular lymphoma grade I, intra-abdominal lymph nodes: Secondary | ICD-10-CM

## 2019-06-07 DIAGNOSIS — M8580 Other specified disorders of bone density and structure, unspecified site: Secondary | ICD-10-CM | POA: Insufficient documentation

## 2019-06-07 DIAGNOSIS — C8213 Follicular lymphoma grade II, intra-abdominal lymph nodes: Secondary | ICD-10-CM | POA: Diagnosis not present

## 2019-06-07 LAB — CBC WITH DIFFERENTIAL (CANCER CENTER ONLY)
Abs Immature Granulocytes: 0.04 10*3/uL (ref 0.00–0.07)
Basophils Absolute: 0.1 10*3/uL (ref 0.0–0.1)
Basophils Relative: 1 %
Eosinophils Absolute: 0.2 10*3/uL (ref 0.0–0.5)
Eosinophils Relative: 2 %
HCT: 42 % (ref 36.0–46.0)
Hemoglobin: 13.9 g/dL (ref 12.0–15.0)
Immature Granulocytes: 0 %
Lymphocytes Relative: 12 %
Lymphs Abs: 1.1 10*3/uL (ref 0.7–4.0)
MCH: 30.1 pg (ref 26.0–34.0)
MCHC: 33.1 g/dL (ref 30.0–36.0)
MCV: 90.9 fL (ref 80.0–100.0)
Monocytes Absolute: 1.2 10*3/uL — ABNORMAL HIGH (ref 0.1–1.0)
Monocytes Relative: 12 %
Neutro Abs: 7.3 10*3/uL (ref 1.7–7.7)
Neutrophils Relative %: 73 %
Platelet Count: 275 10*3/uL (ref 150–400)
RBC: 4.62 MIL/uL (ref 3.87–5.11)
RDW: 13.2 % (ref 11.5–15.5)
WBC Count: 9.9 10*3/uL (ref 4.0–10.5)
nRBC: 0 % (ref 0.0–0.2)

## 2019-06-07 LAB — CMP (CANCER CENTER ONLY)
ALT: 20 U/L (ref 0–44)
AST: 20 U/L (ref 15–41)
Albumin: 3.9 g/dL (ref 3.5–5.0)
Alkaline Phosphatase: 60 U/L (ref 38–126)
Anion gap: 7 (ref 5–15)
BUN: 15 mg/dL (ref 8–23)
CO2: 27 mmol/L (ref 22–32)
Calcium: 10.2 mg/dL (ref 8.9–10.3)
Chloride: 107 mmol/L (ref 98–111)
Creatinine: 0.78 mg/dL (ref 0.44–1.00)
GFR, Est AFR Am: >60 mL/min (ref >60)
GFR, Estimated: >60 mL/min (ref >60)
Glucose, Bld: 101 mg/dL — ABNORMAL HIGH (ref 70–99)
Potassium: 4.4 mmol/L (ref 3.5–5.1)
Sodium: 141 mmol/L (ref 135–145)
Total Bilirubin: 0.5 mg/dL (ref 0.3–1.2)
Total Protein: 6.5 g/dL (ref 6.5–8.1)

## 2019-06-07 LAB — LACTATE DEHYDROGENASE: LDH: 193 U/L — ABNORMAL HIGH (ref 98–192)

## 2019-06-07 MED ORDER — SODIUM CHLORIDE 0.9 % IV SOLN
375.0000 mg/m2 | Freq: Once | INTRAVENOUS | Status: AC
  Administered 2019-06-07: 600 mg via INTRAVENOUS
  Filled 2019-06-07: qty 50

## 2019-06-07 MED ORDER — DIPHENHYDRAMINE HCL 25 MG PO CAPS
50.0000 mg | ORAL_CAPSULE | Freq: Once | ORAL | Status: AC
  Administered 2019-06-07: 10:00:00 50 mg via ORAL

## 2019-06-07 MED ORDER — DIPHENHYDRAMINE HCL 25 MG PO CAPS
ORAL_CAPSULE | ORAL | Status: AC
  Filled 2019-06-07: qty 2

## 2019-06-07 MED ORDER — ACETAMINOPHEN 325 MG PO TABS
650.0000 mg | ORAL_TABLET | Freq: Once | ORAL | Status: AC
  Administered 2019-06-07: 10:00:00 650 mg via ORAL

## 2019-06-07 MED ORDER — SODIUM CHLORIDE 0.9 % IV SOLN
Freq: Once | INTRAVENOUS | Status: AC
  Filled 2019-06-07: qty 250

## 2019-06-07 MED ORDER — ACETAMINOPHEN 325 MG PO TABS
ORAL_TABLET | ORAL | Status: AC
  Filled 2019-06-07: qty 2

## 2019-06-07 NOTE — Patient Instructions (Signed)
[facility] Discharge Instructions for Patients Receiving Chemotherapy  Today you received the following chemotherapy agents:  Rituxan.  To help prevent nausea and vomiting after your treatment, we encourage you to take your nausea medication as directed.   If you develop nausea and vomiting that is not controlled by your nausea medication, call the clinic.   BELOW ARE SYMPTOMS THAT SHOULD BE REPORTED IMMEDIATELY:  *FEVER GREATER THAN 100.5 F  *CHILLS WITH OR WITHOUT FEVER  NAUSEA AND VOMITING THAT IS NOT CONTROLLED WITH YOUR NAUSEA MEDICATION  *UNUSUAL SHORTNESS OF BREATH  *UNUSUAL BRUISING OR BLEEDING  TENDERNESS IN MOUTH AND THROAT WITH OR WITHOUT PRESENCE OF ULCERS  *URINARY PROBLEMS  *BOWEL PROBLEMS  UNUSUAL RASH Items with * indicate a potential emergency and should be followed up as soon as possible.  Feel free to call the clinic should you have any questions or concerns. The clinic phone number is [phone].  Please show the CHEMO ALERT CARD at check-in to the Emergency Department and triage nurse.   

## 2019-06-07 NOTE — Progress Notes (Signed)
  [place] OFFICE PROGRESS NOTE   Diagnosis: Non-Hodgkin's lymphoma  INTERVAL HISTORY:   [name] returns as scheduled.  She was last treated with rituximab on 04/09/2019.  She reports tolerating treatment well.  No symptom of an allergic reaction.  She feels well.  Good appetite no fever or night sweats.  She has mild discomfort in the left mid back for the past few weeks.  No dyspnea.  She is scheduled to receive the second COVID-19 vaccine next week.  Objective:  Vital signs in last 24 hours:  Blood pressure (!) 147/72, pulse 77, temperature 97.8 F (36.6 C), temperature source Temporal, resp. rate 18, height 5\' 2"  (1.575 m), weight 133 lb 6.4 oz (60.5 kg), SpO2 99 %.   Limited physical examination secondary to distancing with the Covid pandemic Lymphatics: No cervical, supraclavicular, axillary, or inguinal nodes Musculoskeletal: No tenderness or mass in the left back GI: No hepatosplenomegaly, no mass, mild tenderness in the left midabdomen Vascular: No leg edema   Lab Results:  Lab Results  Component Value Date   WBC 9.9 06/07/2019   HGB 13.9 06/07/2019   HCT 42.0 06/07/2019   MCV 90.9 06/07/2019   PLT 275 06/07/2019   NEUTROABS 7.3 06/07/2019    CMP  Lab Results  Component Value Date   NA 141 06/07/2019   K 4.4 06/07/2019   CL 107 06/07/2019   CO2 27 06/07/2019   GLUCOSE 101 (H) 06/07/2019   BUN 15 06/07/2019   CREATININE 0.78 06/07/2019   CALCIUM 10.2 06/07/2019   PROT 6.5 06/07/2019   ALBUMIN 3.9 06/07/2019   AST 20 06/07/2019   ALT 20 06/07/2019   ALKPHOS 60 06/07/2019   BILITOT 0.5 06/07/2019   GFRNONAA >60 06/07/2019   GFRAA >60 06/07/2019     Imaging:  CT images from 05/31/2019-reviewed Medications: I have reviewed the patient's current medications.   Assessment/Plan: 1. Non-Hodgkin's lymphoma-follicular lymphoma  Mesenteric lymph node biopsy 07/15/1999-follicular center cell lymphoma, grade 2  Treated with  mitoxantrone, fludarabine, and rituximab completed in January 2002  Progressive disease in February 2004, treated with single agent rituximab February 2004 through August 2005  CTs 06/21/2014-no evidence of lymphoma  CT abdomen/pelvis 06/05/2018-enlarging left mid abdominal mesenteric mass  CT biopsy of the mesenteric mass 06/16/2018-follicular lymphoma, low-grade, CD20 positive  Cycle 1 weekly Rituxan 07/24/2018  Cycle 2 weekly Rituxan 07/31/2018  Cycle 3 weekly Rituxan 08/07/2018  Cycle 4 weekly Rituxan 08/14/2018  CT abdomen/pelvis 10/09/2018-reduction in size of left lower quadrant mesenteric mass  Cycle 1 maintenance rituximab 10/19/2018  Cycle 2 maintenance Rituxan 12/14/2018  Cycle 3 maintenance Rituxan 02/08/2019  Cycle 4 maintenance Rituxan 04/09/2019  CT abdomen/pelvis 05/31/2019-no change in jejunal mesenteric mass is progression  Cycle 5 maintenance rituximab 06/07/2019 2. Irritable bowel syndrome 3. History of anal incontinence 4. Osteopenia 5. History of colon polyps 6. Weakness, flushing, presyncope and chills during Rituxan infusion 07/24/2018. 7. Mild hypercalcemia-chronic    Disposition: [name] [name] appears stable.  The restaging CT reveals no evidence of these progression.  I reviewed the CT images with [name] [name].  The remaining mesenteric mass may represent scar tissue.  The plan is to cement.  The discomfort of the left mid back is likely related to a benign musculoskeletal condition.  [name] [name] will return for office visit and rituximab in 2 months.  Betsy Coder, MD  06/07/2019  9:11 AM

## 2019-06-08 ENCOUNTER — Telehealth: Payer: Self-pay | Admitting: Oncology

## 2019-06-08 NOTE — Telephone Encounter (Signed)
Scheduled per los. Called and spoke with patient. Confirmed appt 

## 2019-06-13 DIAGNOSIS — H04123 Dry eye syndrome of bilateral lacrimal glands: Secondary | ICD-10-CM | POA: Diagnosis not present

## 2019-06-13 DIAGNOSIS — H2513 Age-related nuclear cataract, bilateral: Secondary | ICD-10-CM | POA: Diagnosis not present

## 2019-06-15 ENCOUNTER — Telehealth: Payer: Self-pay | Admitting: Family Medicine

## 2019-06-15 DIAGNOSIS — R5383 Other fatigue: Secondary | ICD-10-CM

## 2019-06-15 DIAGNOSIS — E785 Hyperlipidemia, unspecified: Secondary | ICD-10-CM

## 2019-06-15 DIAGNOSIS — Z23 Encounter for immunization: Secondary | ICD-10-CM | POA: Diagnosis not present

## 2019-06-15 NOTE — Telephone Encounter (Signed)
Blood work ordered in Epic. Patient notified. 

## 2019-06-15 NOTE — Telephone Encounter (Signed)
Patient is requesting labs for her 6 month follow up visit on 2/24

## 2019-06-15 NOTE — Telephone Encounter (Signed)
Last labs CMP and CBC 06/2019 thru cancer center

## 2019-06-15 NOTE — Telephone Encounter (Signed)
Lip tsh

## 2019-06-19 DIAGNOSIS — E785 Hyperlipidemia, unspecified: Secondary | ICD-10-CM | POA: Diagnosis not present

## 2019-06-19 DIAGNOSIS — R5383 Other fatigue: Secondary | ICD-10-CM | POA: Diagnosis not present

## 2019-06-20 LAB — LIPID PANEL
Chol/HDL Ratio: 4.1 ratio (ref 0.0–4.4)
Cholesterol, Total: 215 mg/dL — ABNORMAL HIGH (ref 100–199)
HDL: 53 mg/dL (ref >39)
LDL Chol Calc (NIH): 140 mg/dL — ABNORMAL HIGH (ref 0–99)
Triglycerides: 125 mg/dL (ref 0–149)
VLDL Cholesterol Cal: 22 mg/dL (ref 5–40)

## 2019-06-20 LAB — TSH: TSH: 3.11 u[IU]/mL (ref 0.450–4.500)

## 2019-06-27 ENCOUNTER — Other Ambulatory Visit: Payer: Self-pay

## 2019-06-27 ENCOUNTER — Ambulatory Visit (INDEPENDENT_AMBULATORY_CARE_PROVIDER_SITE_OTHER): Payer: Medicare Other | Admitting: Family Medicine

## 2019-06-27 DIAGNOSIS — E785 Hyperlipidemia, unspecified: Secondary | ICD-10-CM | POA: Diagnosis not present

## 2019-06-27 MED ORDER — ALPRAZOLAM 0.25 MG PO TABS: 0.2500 mg | ORAL_TABLET | Freq: Every day | ORAL | 2 refills | Status: DC | PRN

## 2019-06-27 NOTE — Progress Notes (Signed)
   Subjective:  Audio only  Patient ID: [name] [name], female    DOB: 10-May-1940, 79 y.o.   MRN: [id number]  Hyperlipidemia This is a chronic problem. The current episode started more than 1 year ago. Current antihyperlipidemic treatment includes diet change. There are no compliance problems.  Risk factors for coronary artery disease include dyslipidemia.   Patient would like to discuss recent labs Results for orders placed or performed in visit on 06/15/19  Lipid panel  Result Value Ref Range   Cholesterol, Total 215 (H) 100 - 199 mg/dL   Triglycerides 125 0 - 149 mg/dL   HDL 53 >39 mg/dL   VLDL Cholesterol Cal 22 5 - 40 mg/dL   LDL Chol Calc (NIH) 140 (H) 0 - 99 mg/dL   Chol/HDL Ratio 4.1 0.0 - 4.4 ratio  TSH  Result Value Ref Range   TSH 3.110 0.450 - 4.500 uIU/mL    Review of Systems Virtual Visit via Video Note  I connected with [name] on 06/27/19 at  9:00 AM EST by a video enabled telemedicine application and verified that I am speaking with the correct person using two identifiers.  Location: Patient: home Provider: office   I discussed the limitations of evaluation and management by telemedicine and the availability of in person appointments. The patient expressed understanding and agreed to proceed.  History of Present Illness:    Observations/Objective:   Assessment and Plan:   Follow Up Instructions:    I discussed the assessment and treatment plan with the patient. The patient was provided an opportunity to ask questions and all were answered. The patient agreed with the plan and demonstrated an understanding of the instructions.   The patient was advised to call back or seek an in-person evaluation if the symptoms worsen or if the condition fails to improve as anticipated.  I provided 24 minutes of non-face-to-face time during this encounter.  Patient currently followed closely by oncologist for recurrence of lymphoma  Exercising quite a  bit these days  Still requires occasional low-dose Xanax for anxiety      Objective:   Physical Exam  Virtual      Assessment & Plan:  Impression 1 hyperlipidemia.  HDL solid.  Not high enough for medications.  Diet exercise discussed  2.  Anxiety discussed will refill Xanax  Blood work reviewed  Follow-up as needed or in 1 year

## 2019-07-18 DIAGNOSIS — G245 Blepharospasm: Secondary | ICD-10-CM | POA: Diagnosis not present

## 2019-07-27 NOTE — Progress Notes (Signed)
Pharmacist Chemotherapy Monitoring - Follow Up Assessment    I verify that I have reviewed each item in the below checklist:  . Regimen for the patient is scheduled for the appropriate day and plan matches scheduled date. Marland Kitchen Appropriate non-routine labs are ordered dependent on drug ordered. . If applicable, additional medications reviewed and ordered per protocol based on lifetime cumulative doses and/or treatment regimen.   Plan for follow-up and/or issues identified: Yes . I-vent associated with next due treatment: Yes    Kennith Center, Pharm.D., CPP 07/27/2019@2 :15 PM

## 2019-07-29 ENCOUNTER — Other Ambulatory Visit: Payer: Self-pay | Admitting: Oncology

## 2019-08-02 ENCOUNTER — Inpatient Hospital Stay: Payer: Medicare Other | Attending: Oncology | Admitting: Oncology

## 2019-08-02 ENCOUNTER — Other Ambulatory Visit: Payer: Self-pay

## 2019-08-02 ENCOUNTER — Inpatient Hospital Stay: Payer: Medicare Other

## 2019-08-02 VITALS — BP 118/53 | HR 64 | Temp 98.0°F | Resp 18

## 2019-08-02 VITALS — BP 134/64 | HR 76 | Temp 97.8°F | Resp 17 | Ht 62.0 in | Wt 135.8 lb

## 2019-08-02 DIAGNOSIS — M7989 Other specified soft tissue disorders: Secondary | ICD-10-CM | POA: Insufficient documentation

## 2019-08-02 DIAGNOSIS — M8580 Other specified disorders of bone density and structure, unspecified site: Secondary | ICD-10-CM | POA: Insufficient documentation

## 2019-08-02 DIAGNOSIS — C8203 Follicular lymphoma grade I, intra-abdominal lymph nodes: Secondary | ICD-10-CM | POA: Diagnosis not present

## 2019-08-02 DIAGNOSIS — Z5112 Encounter for antineoplastic immunotherapy: Secondary | ICD-10-CM | POA: Insufficient documentation

## 2019-08-02 DIAGNOSIS — Z79899 Other long term (current) drug therapy: Secondary | ICD-10-CM | POA: Insufficient documentation

## 2019-08-02 LAB — CBC WITH DIFFERENTIAL (CANCER CENTER ONLY)
Abs Immature Granulocytes: 0.02 10*3/uL (ref 0.00–0.07)
Basophils Absolute: 0 10*3/uL (ref 0.0–0.1)
Basophils Relative: 0 %
Eosinophils Absolute: 0.2 10*3/uL (ref 0.0–0.5)
Eosinophils Relative: 2 %
HCT: 41.1 % (ref 36.0–46.0)
Hemoglobin: 13.4 g/dL (ref 12.0–15.0)
Immature Granulocytes: 0 %
Lymphocytes Relative: 13 %
Lymphs Abs: 1.2 10*3/uL (ref 0.7–4.0)
MCH: 29.8 pg (ref 26.0–34.0)
MCHC: 32.6 g/dL (ref 30.0–36.0)
MCV: 91.3 fL (ref 80.0–100.0)
Monocytes Absolute: 1.2 10*3/uL — ABNORMAL HIGH (ref 0.1–1.0)
Monocytes Relative: 13 %
Neutro Abs: 6.7 10*3/uL (ref 1.7–7.7)
Neutrophils Relative %: 72 %
Platelet Count: 263 10*3/uL (ref 150–400)
RBC: 4.5 MIL/uL (ref 3.87–5.11)
RDW: 13.1 % (ref 11.5–15.5)
WBC Count: 9.2 10*3/uL (ref 4.0–10.5)
nRBC: 0 % (ref 0.0–0.2)

## 2019-08-02 LAB — CMP (CANCER CENTER ONLY)
ALT: 14 U/L (ref 0–44)
AST: 19 U/L (ref 15–41)
Albumin: 3.7 g/dL (ref 3.5–5.0)
Alkaline Phosphatase: 66 U/L (ref 38–126)
Anion gap: 11 (ref 5–15)
BUN: 20 mg/dL (ref 8–23)
CO2: 23 mmol/L (ref 22–32)
Calcium: 10.1 mg/dL (ref 8.9–10.3)
Chloride: 108 mmol/L (ref 98–111)
Creatinine: 0.8 mg/dL (ref 0.44–1.00)
GFR, Est AFR Am: >60 mL/min (ref >60)
GFR, Estimated: >60 mL/min (ref >60)
Glucose, Bld: 100 mg/dL — ABNORMAL HIGH (ref 70–99)
Potassium: 4 mmol/L (ref 3.5–5.1)
Sodium: 142 mmol/L (ref 135–145)
Total Bilirubin: 0.3 mg/dL (ref 0.3–1.2)
Total Protein: 6.3 g/dL — ABNORMAL LOW (ref 6.5–8.1)

## 2019-08-02 MED ORDER — DIPHENHYDRAMINE HCL 25 MG PO CAPS
50.0000 mg | ORAL_CAPSULE | Freq: Once | ORAL | Status: AC
  Administered 2019-08-02: 12:00:00 50 mg via ORAL

## 2019-08-02 MED ORDER — SODIUM CHLORIDE 0.9 % IV SOLN
375.0000 mg/m2 | Freq: Once | INTRAVENOUS | Status: AC
  Administered 2019-08-02: 600 mg via INTRAVENOUS
  Filled 2019-08-02: qty 50

## 2019-08-02 MED ORDER — ACETAMINOPHEN 325 MG PO TABS
650.0000 mg | ORAL_TABLET | Freq: Once | ORAL | Status: AC
  Administered 2019-08-02: 12:00:00 650 mg via ORAL

## 2019-08-02 MED ORDER — ACETAMINOPHEN 325 MG PO TABS
ORAL_TABLET | ORAL | Status: AC
  Filled 2019-08-02: qty 2

## 2019-08-02 MED ORDER — DIPHENHYDRAMINE HCL 25 MG PO CAPS
ORAL_CAPSULE | ORAL | Status: AC
  Filled 2019-08-02: qty 2

## 2019-08-02 MED ORDER — SODIUM CHLORIDE 0.9 % IV SOLN
Freq: Once | INTRAVENOUS | Status: AC
  Filled 2019-08-02: qty 250

## 2019-08-02 NOTE — Progress Notes (Signed)
  [facility] OFFICE PROGRESS NOTE   Diagnosis: Non-Hodgkin's lymphoma  INTERVAL HISTORY:   [name] [name] returns as scheduled.  She completed another treatment with rituximab on 06/07/2019.  No symptom of an allergic reaction.  She feels well.  Good appetite.  She walks 3 miles daily.  She has noted intermittent mild swelling of the left greater than right ankle.  Objective:  Vital signs in last 24 hours:  Blood pressure 134/64, pulse 76, temperature 97.8 F (36.6 C), temperature source Temporal, resp. rate 17, height 5\' 2"  (1.575 m), weight 135 lb 12.8 oz (61.6 kg), SpO2 100 %.    GI: No mass, nontender, no hepatosplenomegaly Vascular: No leg or ankle edema, no erythema or palpable cord.  Bilateral venous varicosities.   Lab Results:  Lab Results  Component Value Date   WBC 9.2 08/02/2019   HGB 13.4 08/02/2019   HCT 41.1 08/02/2019   MCV 91.3 08/02/2019   PLT 263 08/02/2019   NEUTROABS 6.7 08/02/2019    CMP  Lab Results  Component Value Date   NA 142 08/02/2019   K 4.0 08/02/2019   CL 108 08/02/2019   CO2 23 08/02/2019   GLUCOSE 100 (H) 08/02/2019   BUN 20 08/02/2019   CREATININE 0.80 08/02/2019   CALCIUM 10.1 08/02/2019   PROT 6.3 (L) 08/02/2019   ALBUMIN 3.7 08/02/2019   AST 19 08/02/2019   ALT 14 08/02/2019   ALKPHOS 66 08/02/2019   BILITOT 0.3 08/02/2019   GFRNONAA >60 08/02/2019   GFRAA >60 08/02/2019    Medications: I have reviewed the patient's current medications.   Assessment/Plan: 1. Non-Hodgkin's lymphoma-follicular lymphoma  Mesenteric lymph node biopsy 07/15/1999-follicular center cell lymphoma, grade 2  Treated with mitoxantrone, fludarabine, and rituximab completed in January 2002  Progressive disease in February 2004, treated with single agent rituximab February 2004 through August 2005  CTs 06/21/2014-no evidence of lymphoma  CT abdomen/pelvis 06/05/2018-enlarging left mid abdominal mesenteric mass  CT biopsy of the  mesenteric mass 06/16/2018-follicular lymphoma, low-grade, CD20 positive  Cycle 1 weekly Rituxan 07/24/2018  Cycle 2 weekly Rituxan 07/31/2018  Cycle 3 weekly Rituxan 08/07/2018  Cycle 4 weekly Rituxan 08/14/2018  CT abdomen/pelvis 10/09/2018-reduction in size of left lower quadrant mesenteric mass  Cycle 1 maintenance rituximab 10/19/2018  Cycle 2 maintenance Rituxan 12/14/2018  Cycle 3 maintenance Rituxan 02/08/2019  Cycle 4 maintenance Rituxan 04/09/2019  CT abdomen/pelvis 05/31/2019-no change in jejunal mesenteric mass is progression  Cycle 5 maintenance rituximab 06/07/2019  Cycle 6 maintenance rituximab 08/02/2019 2. Irritable bowel syndrome 3. History of anal incontinence 4. Osteopenia 5. History of colon polyps 6. Weakness, flushing, presyncope and chills during Rituxan infusion 07/24/2018. 7. Mild hypercalcemia-chronic     Disposition: [name] is in remission from non-Hodgkin's lymphoma.  She is tolerating the rituximab well.  She will complete another treatment today.  The leg swelling is likely a benign finding.  She will contact us for increased swelling or pain.  [name] will return for an office visit and rituximab in 2 months.     Betsy Coder, MD  08/02/2019  10:48 AM

## 2019-08-02 NOTE — Patient Instructions (Signed)
[facility] Discharge Instructions for Patients Receiving Chemotherapy  Today you received the following chemotherapy agents:  Rituxan.  To help prevent nausea and vomiting after your treatment, we encourage you to take your nausea medication as directed.   If you develop nausea and vomiting that is not controlled by your nausea medication, call the clinic.   BELOW ARE SYMPTOMS THAT SHOULD BE REPORTED IMMEDIATELY:  *FEVER GREATER THAN 100.5 F  *CHILLS WITH OR WITHOUT FEVER  NAUSEA AND VOMITING THAT IS NOT CONTROLLED WITH YOUR NAUSEA MEDICATION  *UNUSUAL SHORTNESS OF BREATH  *UNUSUAL BRUISING OR BLEEDING  TENDERNESS IN MOUTH AND THROAT WITH OR WITHOUT PRESENCE OF ULCERS  *URINARY PROBLEMS  *BOWEL PROBLEMS  UNUSUAL RASH Items with * indicate a potential emergency and should be followed up as soon as possible.  Feel free to call the clinic should you have any questions or concerns. The clinic phone number is [phone].  Please show the CHEMO ALERT CARD at check-in to the Emergency Department and triage nurse.   

## 2019-08-03 ENCOUNTER — Telehealth: Payer: Self-pay | Admitting: Oncology

## 2019-08-03 NOTE — Telephone Encounter (Signed)
Scheduled per los. Called and spoke with patient. Confirmed appt 

## 2019-08-07 DIAGNOSIS — Z79899 Other long term (current) drug therapy: Secondary | ICD-10-CM | POA: Diagnosis not present

## 2019-08-07 DIAGNOSIS — H2513 Age-related nuclear cataract, bilateral: Secondary | ICD-10-CM | POA: Diagnosis not present

## 2019-08-07 DIAGNOSIS — H43392 Other vitreous opacities, left eye: Secondary | ICD-10-CM | POA: Diagnosis not present

## 2019-08-07 DIAGNOSIS — H35371 Puckering of macula, right eye: Secondary | ICD-10-CM | POA: Diagnosis not present

## 2019-08-07 DIAGNOSIS — H43813 Vitreous degeneration, bilateral: Secondary | ICD-10-CM | POA: Diagnosis not present

## 2019-08-07 DIAGNOSIS — H3554 Dystrophies primarily involving the retinal pigment epithelium: Secondary | ICD-10-CM | POA: Diagnosis not present

## 2019-08-07 DIAGNOSIS — H35033 Hypertensive retinopathy, bilateral: Secondary | ICD-10-CM | POA: Diagnosis not present

## 2019-08-07 DIAGNOSIS — H268 Other specified cataract: Secondary | ICD-10-CM | POA: Diagnosis not present

## 2019-08-07 DIAGNOSIS — H468 Other optic neuritis: Secondary | ICD-10-CM | POA: Diagnosis not present

## 2019-08-07 DIAGNOSIS — H35342 Macular cyst, hole, or pseudohole, left eye: Secondary | ICD-10-CM | POA: Diagnosis not present

## 2019-08-22 ENCOUNTER — Ambulatory Visit (INDEPENDENT_AMBULATORY_CARE_PROVIDER_SITE_OTHER): Payer: Medicare Other | Admitting: Family Medicine

## 2019-08-22 ENCOUNTER — Encounter: Payer: Self-pay | Admitting: Family Medicine

## 2019-08-22 ENCOUNTER — Other Ambulatory Visit: Payer: Self-pay

## 2019-08-22 VITALS — BP 132/76 | Temp 98.4°F | Ht 62.0 in | Wt 134.0 lb

## 2019-08-22 DIAGNOSIS — R519 Headache, unspecified: Secondary | ICD-10-CM

## 2019-08-22 NOTE — Progress Notes (Signed)
   Subjective:    Patient ID: [name] [name], female    DOB: 09-Apr-1941, 79 y.o.   MRN: [id number]  Headache  This is a recurrent problem. Episode onset: 2 months off and on getting more frequent. Episode frequency: having the pains daily lately. Pain location: above left eye. The quality of the pain is described as shooting and sharp.  pain last about 15 - 20 seconds.    sudden sharp fleeting pain   Sometimes very brief   Sometimes  Ten or 15 seconds  When the pain hits    Very painful on occasion  Pain free between the attacks of pain  Took no meds     Review of Systems  Neurological: Positive for headaches.       Objective:   Physical Exam  Alert vitals stable, NAD. Blood pressure good on repeat. HEENT normal. Lungs clear. Heart regular rate and rhythm. No tenderness along the temporal artery.  Temporoparietal region intact sensation of scalp bilateral      Assessment & Plan:  Impression neuropathic headaches by description.  Nature of pain discussed.  This is emanating from a cutaneous nerve in the scalp.  Chance of temporal neuritis arteritis extremely low discussed we will do blood work to be sure.  Addendum blood work returned negative.  Anti-inflammatory medicine.  From pain expect gradual resolution

## 2019-08-23 LAB — CBC WITH DIFFERENTIAL/PLATELET
Basophils Absolute: 0.1 10*3/uL (ref 0.0–0.2)
Basos: 1 %
EOS (ABSOLUTE): 0.2 10*3/uL (ref 0.0–0.4)
Eos: 3 %
Hematocrit: 40.9 % (ref 34.0–46.6)
Hemoglobin: 14.1 g/dL (ref 11.1–15.9)
Immature Grans (Abs): 0 10*3/uL (ref 0.0–0.1)
Immature Granulocytes: 0 %
Lymphocytes Absolute: 1.5 10*3/uL (ref 0.7–3.1)
Lymphs: 20 %
MCH: 30.8 pg (ref 26.6–33.0)
MCHC: 34.5 g/dL (ref 31.5–35.7)
MCV: 89 fL (ref 79–97)
Monocytes Absolute: 1.1 10*3/uL — ABNORMAL HIGH (ref 0.1–0.9)
Monocytes: 14 %
Neutrophils Absolute: 4.7 10*3/uL (ref 1.4–7.0)
Neutrophils: 62 %
Platelets: 296 10*3/uL (ref 150–450)
RBC: 4.58 x10E6/uL (ref 3.77–5.28)
RDW: 12 % (ref 11.7–15.4)
WBC: 7.5 10*3/uL (ref 3.4–10.8)

## 2019-08-23 LAB — SEDIMENTATION RATE: Sed Rate: 2 mm/hr (ref 0–40)

## 2019-09-13 ENCOUNTER — Other Ambulatory Visit: Payer: Self-pay | Admitting: Internal Medicine

## 2019-09-23 ENCOUNTER — Other Ambulatory Visit: Payer: Self-pay | Admitting: Oncology

## 2019-09-27 ENCOUNTER — Inpatient Hospital Stay: Payer: Medicare Other | Attending: Oncology | Admitting: Oncology

## 2019-09-27 ENCOUNTER — Inpatient Hospital Stay: Payer: Medicare Other

## 2019-09-27 ENCOUNTER — Encounter: Payer: Self-pay | Admitting: Oncology

## 2019-09-27 ENCOUNTER — Other Ambulatory Visit: Payer: Self-pay

## 2019-09-27 ENCOUNTER — Other Ambulatory Visit: Payer: Self-pay | Admitting: *Deleted

## 2019-09-27 VITALS — BP 131/63 | HR 91 | Temp 97.8°F | Resp 18 | Wt 132.7 lb

## 2019-09-27 VITALS — BP 108/62 | HR 63 | Temp 98.2°F | Resp 18 | Ht 62.0 in | Wt 132.7 lb

## 2019-09-27 DIAGNOSIS — M8580 Other specified disorders of bone density and structure, unspecified site: Secondary | ICD-10-CM | POA: Insufficient documentation

## 2019-09-27 DIAGNOSIS — Z5112 Encounter for antineoplastic immunotherapy: Secondary | ICD-10-CM | POA: Insufficient documentation

## 2019-09-27 DIAGNOSIS — C8203 Follicular lymphoma grade I, intra-abdominal lymph nodes: Secondary | ICD-10-CM

## 2019-09-27 LAB — CBC WITH DIFFERENTIAL (CANCER CENTER ONLY)
Abs Immature Granulocytes: 0.02 10*3/uL (ref 0.00–0.07)
Basophils Absolute: 0.1 10*3/uL (ref 0.0–0.1)
Basophils Relative: 1 %
Eosinophils Absolute: 0.2 10*3/uL (ref 0.0–0.5)
Eosinophils Relative: 2 %
HCT: 43.8 % (ref 36.0–46.0)
Hemoglobin: 14.4 g/dL (ref 12.0–15.0)
Immature Granulocytes: 0 %
Lymphocytes Relative: 17 %
Lymphs Abs: 1.4 10*3/uL (ref 0.7–4.0)
MCH: 29.9 pg (ref 26.0–34.0)
MCHC: 32.9 g/dL (ref 30.0–36.0)
MCV: 91.1 fL (ref 80.0–100.0)
Monocytes Absolute: 1.1 10*3/uL — ABNORMAL HIGH (ref 0.1–1.0)
Monocytes Relative: 14 %
Neutro Abs: 5.4 10*3/uL (ref 1.7–7.7)
Neutrophils Relative %: 66 %
Platelet Count: 268 10*3/uL (ref 150–400)
RBC: 4.81 MIL/uL (ref 3.87–5.11)
RDW: 12.5 % (ref 11.5–15.5)
WBC Count: 8.1 10*3/uL (ref 4.0–10.5)
nRBC: 0 % (ref 0.0–0.2)

## 2019-09-27 LAB — CMP (CANCER CENTER ONLY)
ALT: 16 U/L (ref 0–44)
AST: 21 U/L (ref 15–41)
Albumin: 3.9 g/dL (ref 3.5–5.0)
Alkaline Phosphatase: 71 U/L (ref 38–126)
Anion gap: 9 (ref 5–15)
BUN: 16 mg/dL (ref 8–23)
CO2: 26 mmol/L (ref 22–32)
Calcium: 10.3 mg/dL (ref 8.9–10.3)
Chloride: 108 mmol/L (ref 98–111)
Creatinine: 0.81 mg/dL (ref 0.44–1.00)
GFR, Est AFR Am: >60 mL/min (ref >60)
GFR, Estimated: >60 mL/min (ref >60)
Glucose, Bld: 76 mg/dL (ref 70–99)
Potassium: 3.8 mmol/L (ref 3.5–5.1)
Sodium: 143 mmol/L (ref 135–145)
Total Bilirubin: 0.3 mg/dL (ref 0.3–1.2)
Total Protein: 6.6 g/dL (ref 6.5–8.1)

## 2019-09-27 MED ORDER — DIPHENHYDRAMINE HCL 25 MG PO CAPS
ORAL_CAPSULE | ORAL | Status: AC
  Filled 2019-09-27: qty 2

## 2019-09-27 MED ORDER — SODIUM CHLORIDE 0.9 % IV SOLN
Freq: Once | INTRAVENOUS | Status: AC
  Filled 2019-09-27: qty 250

## 2019-09-27 MED ORDER — ACETAMINOPHEN 325 MG PO TABS
ORAL_TABLET | ORAL | Status: AC
  Filled 2019-09-27: qty 2

## 2019-09-27 MED ORDER — PREDNISONE 50 MG PO TABS: ORAL_TABLET | ORAL | 0 refills | Status: DC

## 2019-09-27 MED ORDER — ACETAMINOPHEN 325 MG PO TABS
650.0000 mg | ORAL_TABLET | Freq: Once | ORAL | Status: AC
  Administered 2019-09-27: 650 mg via ORAL

## 2019-09-27 MED ORDER — DIPHENHYDRAMINE HCL 25 MG PO CAPS
50.0000 mg | ORAL_CAPSULE | Freq: Once | ORAL | Status: AC
  Administered 2019-09-27: 50 mg via ORAL

## 2019-09-27 MED ORDER — SODIUM CHLORIDE 0.9 % IV SOLN
375.0000 mg/m2 | Freq: Once | INTRAVENOUS | Status: AC
  Administered 2019-09-27: 600 mg via INTRAVENOUS
  Filled 2019-09-27: qty 50

## 2019-09-27 NOTE — Progress Notes (Signed)
  [facility] OFFICE PROGRESS NOTE   Diagnosis: Non-Hodgkin's lymphoma  INTERVAL HISTORY:   Ms. Haussler completed another cycle of rituximab on 08/02/2019.  No symptom of allergic reaction.  She feels well.  Good appetite.  She continues to have intermittent discomfort in the left abdomen.  No new complaint.  Objective:  Vital signs in last 24 hours:  Blood pressure 131/63, pulse 91, temperature 97.8 F (36.6 C), temperature source Temporal, resp. rate 18, weight 132 lb 11.2 oz (60.2 kg), SpO2 100 %.    Lymphatics: No cervical, supraclavicular, axillary, or inguinal nodes Resp: Lungs clear bilaterally Cardio: Regular rate and rhythm GI: No hepatosplenomegaly, mild fullness in the left lower abdomen with associated tenderness Vascular: No leg edema   Lab Results:  Lab Results  Component Value Date   WBC 8.1 09/27/2019   HGB 14.4 09/27/2019   HCT 43.8 09/27/2019   MCV 91.1 09/27/2019   PLT 268 09/27/2019   NEUTROABS 5.4 09/27/2019    CMP  Lab Results  Component Value Date   NA 143 09/27/2019   K 3.8 09/27/2019   CL 108 09/27/2019   CO2 26 09/27/2019   GLUCOSE 76 09/27/2019   BUN 16 09/27/2019   CREATININE 0.81 09/27/2019   CALCIUM 10.3 09/27/2019   PROT 6.6 09/27/2019   ALBUMIN 3.9 09/27/2019   AST 21 09/27/2019   ALT 16 09/27/2019   ALKPHOS 71 09/27/2019   BILITOT 0.3 09/27/2019   GFRNONAA >60 09/27/2019   GFRAA >60 09/27/2019     Medications: I have reviewed the patient's current medications.   Assessment/Plan:  1. Non-Hodgkin's lymphoma-follicular lymphoma  Mesenteric lymph node biopsy 07/15/1999-follicular center cell lymphoma, grade 2  Treated with mitoxantrone, fludarabine, and rituximab completed in January 2002  Progressive disease in February 2004, treated with single agent rituximab February 2004 through August 2005  CTs 06/21/2014-no evidence of lymphoma  CT abdomen/pelvis 06/05/2018-enlarging left mid abdominal mesenteric  mass  CT biopsy of the mesenteric mass 06/16/2018-follicular lymphoma, low-grade, CD20 positive  Cycle 1 weekly Rituxan 07/24/2018  Cycle 2 weekly Rituxan 07/31/2018  Cycle 3 weekly Rituxan 08/07/2018  Cycle 4 weekly Rituxan 08/14/2018  CT abdomen/pelvis 10/09/2018-reduction in size of left lower quadrant mesenteric mass  Cycle 1 maintenance rituximab 10/19/2018  Cycle 2 maintenance Rituxan 12/14/2018  Cycle 3 maintenance Rituxan 02/08/2019  Cycle 4 maintenance Rituxan 04/09/2019  CT abdomen/pelvis 05/31/2019-no change in jejunal mesenteric mass, no evidence of disease progression.  Cycle 5 maintenance rituximab 06/07/2019  Cycle 6 maintenance rituximab 08/02/2019 2. Irritable bowel syndrome 3. History of anal incontinence 4. Osteopenia 5. History of colon polyps 6. Weakness, flushing, presyncope and chills during Rituxan infusion 07/24/2018. 7. Mild hypercalcemia-chronic   Disposition: Ms. Kaur appears stable.  She continues to have mild discomfort in the left abdomen.  She will complete another treatment with rituximab today.  She will return for an office visit and restaging CT in 2 months.  Betsy Coder, MD  09/27/2019  9:08 AM

## 2019-10-04 ENCOUNTER — Telehealth: Payer: Self-pay | Admitting: *Deleted

## 2019-10-04 NOTE — Telephone Encounter (Signed)
Asking for CT scan to get scheduled so she can put it on her calendar. Informed her we are waiting for insurance approval before we are allowed to schedule it and other appointments are based on when scan is done. She wishes to avoid 11/26/19. Staff message sent to imaging authorization specialist.

## 2019-10-05 ENCOUNTER — Encounter: Payer: Self-pay | Admitting: Nurse Practitioner

## 2019-10-05 ENCOUNTER — Other Ambulatory Visit: Payer: Self-pay

## 2019-10-05 ENCOUNTER — Ambulatory Visit (INDEPENDENT_AMBULATORY_CARE_PROVIDER_SITE_OTHER): Payer: Medicare Other | Admitting: Nurse Practitioner

## 2019-10-05 VITALS — BP 132/78 | Temp 98.0°F | Ht 62.0 in | Wt 132.8 lb

## 2019-10-05 DIAGNOSIS — N390 Urinary tract infection, site not specified: Secondary | ICD-10-CM | POA: Diagnosis not present

## 2019-10-05 DIAGNOSIS — Z0000 Encounter for general adult medical examination without abnormal findings: Secondary | ICD-10-CM

## 2019-10-05 DIAGNOSIS — Z131 Encounter for screening for diabetes mellitus: Secondary | ICD-10-CM | POA: Diagnosis not present

## 2019-10-05 LAB — POCT URINALYSIS DIPSTICK (MANUAL)
Leukocytes, UA: NEGATIVE
Nitrite, UA: POSITIVE — AB
Poct Blood: NEGATIVE
Poct Glucose: NORMAL mg/dL
Poct Protein: 30 mg/dL — AB
Spec Grav, UA: 1.015 (ref 1.010–1.025)
pH, UA: 6 (ref 5.0–8.0)

## 2019-10-05 LAB — POCT GLYCOSYLATED HEMOGLOBIN (HGB A1C): Hemoglobin A1C: 5.3 % (ref 4.0–5.6)

## 2019-10-05 MED ORDER — CEPHALEXIN 500 MG PO CAPS
500.0000 mg | ORAL_CAPSULE | Freq: Three times a day (TID) | ORAL | 0 refills | Status: DC
Start: 2019-10-05 — End: 2019-11-22

## 2019-10-05 NOTE — Progress Notes (Signed)
   Subjective:    Patient ID: [name], female    DOB: 11-26-1940, 79 y.o.   MRN: [id number]  HPI AWV- Annual Wellness Visit  The patient was seen for their annual wellness visit. The patient's past medical history, surgical history, and family history were reviewed. Pertinent vaccines were reviewed ( tetanus, pneumonia, shingles, flu) The patient's medication list was reviewed and updated.  The height and weight were entered.  BMI recorded in electronic record elsewhere  Cognitive screening was completed. Outcome of Mini - Cog: pass   Falls /depression screening electronically recorded within record elsewhere  Current tobacco usage: none (All patients who use tobacco were given written and verbal information on quitting)  Recent listing of emergency department/hospitalizations over the past year were reviewed.  current specialist the patient sees on a regular basis: oncology, cardiologist, gastro.   Medicare annual wellness visit patient questionnaire was reviewed.  A written screening schedule for the patient for the next 5-10 years was given. Appropriate discussion of followup regarding next visit was discussed.       Review of Systems     Objective:   Physical Exam        Assessment & Plan:

## 2019-10-05 NOTE — Progress Notes (Signed)
Subjective:    Patient ID: [name] [name], female    DOB: 05-20-1940, 79 y.o.   MRN: [id number]  HPI Patient presents today for Well Woman exam. Patient denies any GI/GU concerns at this time. Reports she sees a dermatologist yearly and eye and dental exams are up to date. Patient denies any pain or other concerns. Patient is currently undergoing cancer treatments and is followed closely by her oncologist. Reports concerns of potential low blood sugars in the mornings as she states she feels shaky. Relieved after eating a cookie. Patient states she has a well balanced diet and walks an hour every day.    Review of Systems  Constitutional: Negative for activity change, appetite change and unexpected weight change.  HENT: Negative for trouble swallowing.   Respiratory: Negative for cough, chest tightness, shortness of breath and wheezing.   Cardiovascular: Negative for chest pain and palpitations.  Gastrointestinal: Negative for abdominal pain, blood in stool, constipation, diarrhea, nausea, rectal pain and vomiting.  Endocrine: Negative for cold intolerance and heat intolerance.  Genitourinary: Negative for decreased urine volume, difficulty urinating, dysuria, enuresis, flank pain, frequency, genital sores, pelvic pain, vaginal bleeding, vaginal discharge and vaginal pain.       Slight odor to her urine at times.   Skin: Negative for color change, rash and wound.       Patient has reported skin tags and age spots that she sees the dermatologist for yearly .  Neurological: Negative for dizziness, tremors, syncope, facial asymmetry and numbness.   Depression screen [facility] 2/9 10/05/2019 06/27/2019 01/07/2017 11/19/2014 11/05/2014  Decreased Interest 0 0 0 0 0  Down, Depressed, Hopeless 0 0 0 0 0  PHQ - 2 Score 0 0 0 0 0  Altered sleeping - 0 - - -  Tired, decreased energy - 0 - - -  Change in appetite - 0 - - -  Feeling bad or failure about yourself  - 0 - - -  Trouble concentrating - 0 - - -    Moving slowly or fidgety/restless - 0 - - -  Suicidal thoughts - 0 - - -  PHQ-9 Score - 0 - - -  Difficult doing work/chores - Not difficult at all - - -  Some recent data might be hidden       Objective:   Physical Exam Exam conducted with a chaperone present.  Constitutional:      General: She is awake. She is not in acute distress.    Appearance: Normal appearance. She is normal weight. She is not ill-appearing or toxic-appearing.  Neck:     Comments: Thyroid non tender to palpation. No mass or goiter noted.  Cardiovascular:     Rate and Rhythm: Normal rate and regular rhythm.     Heart sounds: Normal heart sounds and S2 normal.  Pulmonary:     Effort: Pulmonary effort is normal.     Breath sounds: Normal breath sounds.  Chest:     Breasts: Breasts are symmetrical.        Right: Normal. No swelling, inverted nipple, mass, nipple discharge or tenderness.        Left: Normal. No swelling, inverted nipple, mass, nipple discharge or tenderness.     Comments: Skin tags noted on breasts and axilla region, no noted bleeding or enlarged lymph nodes. Patient denies pain or tenderness. Abdominal:     General: Abdomen is flat. There is no distension. There are no signs of injury.  Palpations: Abdomen is soft. There is no mass.     Tenderness: There is no abdominal tenderness.  Genitourinary:    General: Normal vulva.     Labia:        Right: No rash, tenderness, lesion or injury.        Left: No rash, tenderness, lesion or injury.      Vagina: Normal. No signs of injury and foreign body. No vaginal discharge.  Lymphadenopathy:     Upper Body:     Right upper body: No supraclavicular, axillary or pectoral adenopathy.     Left upper body: No supraclavicular, axillary or pectoral adenopathy.  Skin:    General: Skin is warm and dry.     Comments: Skin damage noted. Multiple lesions mostly seborrheic keratosis.   Neurological:     Mental Status: She is alert and oriented to  person, place, and time. Mental status is at baseline.  Psychiatric:        Attention and Perception: Attention and perception normal.        Mood and Affect: Mood and affect normal. Mood is not anxious or depressed.        Speech: Speech normal.        Behavior: Behavior normal. Behavior is cooperative.        Thought Content: Thought content normal.        Cognition and Memory: Cognition and memory normal.        Judgment: Judgment normal.    Results for orders placed or performed in visit on 10/05/19  POCT Urinalysis Dip Manual  Result Value Ref Range   Spec Grav, UA 1.015 1.010 - 1.025   pH, UA 6.0 5.0 - 8.0   Leukocytes, UA Negative Negative   Nitrite, UA Positive (A) Negative   Poct Protein +30 (A) Negative, trace mg/dL   Poct Glucose Normal Normal mg/dL   Poct Ketones     Poct Urobilinogen     Poct Bilirubin + (A) Negative   Poct Blood Negative Negative, trace  POCT HgB A1C  Result Value Ref Range   Hemoglobin A1C 5.3 4.0 - 5.6 %   HbA1c POC (<> result, manual entry)     HbA1c, POC (prediabetic range)     HbA1c, POC (controlled diabetic range)     Urine Micro Negative    Assessment & Plan:   Problem List Items Addressed This Visit    None    Visit Diagnoses    Routine general medical examination at a health care facility    -  Primary   Relevant Orders   POCT Urinalysis Dip Manual (Completed)   Screening for diabetes mellitus       Relevant Orders   POCT HgB A1C (Completed)   Acute lower UTI       Relevant Medications   cephALEXin (KEFLEX) 500 MG capsule     Meds ordered this encounter  Medications   cephALEXin (KEFLEX) 500 MG capsule    Sig: Take 1 capsule (500 mg total) by mouth 3 (three) times daily. For UTI    Dispense:  21 capsule    Refill:  0    Order Specific Question:   Supervising Provider    Answer:   Sallee Lange A [[id number]]   Start Keflex as directed. Warning signs reviewed. Call back next week if no improvement, sooner if worse.  Patient  to continue to follow up with Oncology, cardiology, and dermatology.No regular medication changes at the time.  Patient to  follow up in 1 year.

## 2019-10-07 ENCOUNTER — Encounter: Payer: Self-pay | Admitting: Nurse Practitioner

## 2019-10-09 ENCOUNTER — Telehealth: Payer: Self-pay | Admitting: Oncology

## 2019-10-09 NOTE — Telephone Encounter (Signed)
Called and left msg about July appts

## 2019-10-15 ENCOUNTER — Encounter: Payer: Self-pay | Admitting: *Deleted

## 2019-11-18 ENCOUNTER — Other Ambulatory Visit: Payer: Self-pay | Admitting: Oncology

## 2019-11-19 ENCOUNTER — Other Ambulatory Visit: Payer: Self-pay | Admitting: *Deleted

## 2019-11-19 DIAGNOSIS — C8203 Follicular lymphoma grade I, intra-abdominal lymph nodes: Secondary | ICD-10-CM

## 2019-11-19 MED ORDER — PREDNISONE 50 MG PO TABS: ORAL_TABLET | ORAL | 0 refills | Status: DC

## 2019-11-20 ENCOUNTER — Other Ambulatory Visit: Payer: Self-pay

## 2019-11-20 ENCOUNTER — Other Ambulatory Visit: Payer: Self-pay | Admitting: *Deleted

## 2019-11-20 ENCOUNTER — Ambulatory Visit (HOSPITAL_COMMUNITY)
Admission: RE | Admit: 2019-11-20 | Discharge: 2019-11-20 | Disposition: A | Discharge status: Home / Self Care | Payer: Medicare Other | Source: Ambulatory Visit | Attending: Oncology | Admitting: Oncology

## 2019-11-20 ENCOUNTER — Inpatient Hospital Stay: Payer: Medicare Other | Attending: Oncology

## 2019-11-20 ENCOUNTER — Encounter (HOSPITAL_COMMUNITY): Payer: Self-pay

## 2019-11-20 DIAGNOSIS — Z5112 Encounter for antineoplastic immunotherapy: Secondary | ICD-10-CM | POA: Diagnosis not present

## 2019-11-20 DIAGNOSIS — R109 Unspecified abdominal pain: Secondary | ICD-10-CM | POA: Insufficient documentation

## 2019-11-20 DIAGNOSIS — C8203 Follicular lymphoma grade I, intra-abdominal lymph nodes: Secondary | ICD-10-CM

## 2019-11-20 DIAGNOSIS — Z9221 Personal history of antineoplastic chemotherapy: Secondary | ICD-10-CM | POA: Insufficient documentation

## 2019-11-20 DIAGNOSIS — Z79899 Other long term (current) drug therapy: Secondary | ICD-10-CM | POA: Insufficient documentation

## 2019-11-20 DIAGNOSIS — C8218 Follicular lymphoma grade II, lymph nodes of multiple sites: Secondary | ICD-10-CM | POA: Insufficient documentation

## 2019-11-20 DIAGNOSIS — Z23 Encounter for immunization: Secondary | ICD-10-CM | POA: Diagnosis not present

## 2019-11-20 DIAGNOSIS — M8580 Other specified disorders of bone density and structure, unspecified site: Secondary | ICD-10-CM | POA: Diagnosis not present

## 2019-11-20 DIAGNOSIS — C8590 Non-Hodgkin lymphoma, unspecified, unspecified site: Secondary | ICD-10-CM | POA: Diagnosis not present

## 2019-11-20 LAB — CMP (CANCER CENTER ONLY)
ALT: 18 U/L (ref 0–44)
AST: 22 U/L (ref 15–41)
Albumin: 4.3 g/dL (ref 3.5–5.0)
Alkaline Phosphatase: 70 U/L (ref 38–126)
Anion gap: 10 (ref 5–15)
BUN: 18 mg/dL (ref 8–23)
CO2: 26 mmol/L (ref 22–32)
Calcium: 11.1 mg/dL — ABNORMAL HIGH (ref 8.9–10.3)
Chloride: 104 mmol/L (ref 98–111)
Creatinine: 0.84 mg/dL (ref 0.44–1.00)
GFR, Est AFR Am: >60 mL/min (ref >60)
GFR, Estimated: >60 mL/min (ref >60)
Glucose, Bld: 143 mg/dL — ABNORMAL HIGH (ref 70–99)
Potassium: 5.6 mmol/L — ABNORMAL HIGH (ref 3.5–5.1)
Sodium: 140 mmol/L (ref 135–145)
Total Bilirubin: 0.4 mg/dL (ref 0.3–1.2)
Total Protein: 7.3 g/dL (ref 6.5–8.1)

## 2019-11-20 LAB — CBC WITH DIFFERENTIAL (CANCER CENTER ONLY)
Abs Immature Granulocytes: 0.05 10*3/uL (ref 0.00–0.07)
Basophils Absolute: 0 10*3/uL (ref 0.0–0.1)
Basophils Relative: 0 %
Eosinophils Absolute: 0 10*3/uL (ref 0.0–0.5)
Eosinophils Relative: 0 %
HCT: 44.6 % (ref 36.0–46.0)
Hemoglobin: 14.5 g/dL (ref 12.0–15.0)
Immature Granulocytes: 1 %
Lymphocytes Relative: 9 %
Lymphs Abs: 0.9 10*3/uL (ref 0.7–4.0)
MCH: 30 pg (ref 26.0–34.0)
MCHC: 32.5 g/dL (ref 30.0–36.0)
MCV: 92.3 fL (ref 80.0–100.0)
Monocytes Absolute: 0.2 10*3/uL (ref 0.1–1.0)
Monocytes Relative: 2 %
Neutro Abs: 8.9 10*3/uL — ABNORMAL HIGH (ref 1.7–7.7)
Neutrophils Relative %: 88 %
Platelet Count: 301 10*3/uL (ref 150–400)
RBC: 4.83 MIL/uL (ref 3.87–5.11)
RDW: 12.8 % (ref 11.5–15.5)
WBC Count: 10 10*3/uL (ref 4.0–10.5)
nRBC: 0 % (ref 0.0–0.2)

## 2019-11-20 LAB — LACTATE DEHYDROGENASE: LDH: 220 U/L — ABNORMAL HIGH (ref 98–192)

## 2019-11-20 MED ORDER — IOHEXOL 9 MG/ML PO SOLN
ORAL | Status: AC
  Filled 2019-11-20: qty 1000

## 2019-11-20 MED ORDER — IOHEXOL 300 MG/ML  SOLN
100.0000 mL | Freq: Once | INTRAMUSCULAR | Status: AC | PRN
  Administered 2019-11-20: 100 mL via INTRAVENOUS

## 2019-11-20 MED ORDER — SODIUM CHLORIDE (PF) 0.9 % IJ SOLN
INTRAMUSCULAR | Status: AC
  Filled 2019-11-20: qty 50

## 2019-11-20 NOTE — Progress Notes (Signed)
CARDIOLOGY OFFICE NOTE  Date:  11/26/2019    [name] [name] Date of Birth: 1940/12/13 Medical Record #[id number]  PCP:  Mikey Kirschner, MD (Inactive)  Cardiologist:  Hazle Quant   Chief Complaint  Patient presents with  . Follow-up    Seen for Dr. Meda Coffee    History of Present Illness: [name] [name] is a 79 y.o. female who presents today for a 6 month check. Seen for Dr. Meda Coffee.   She has a history ofMVP, bradycardia-tachycardia syndrome, palpitations, HLD, hypertension, normal coronary arteries and LV function on cath in 10/16/13.She has had NSVT on prior Holter. Echo ok with normal EF and diastolic dysfunction. She has seen EP. She is managed on beta blocker - has not tolerated Metoprolol in the past.Flecainide would be another medical option in conjunction with her beta blocker if she were to have bothersome symptoms.Cardiac MRI has been normal. Other issues include non Hodgkin's lymphoma - found in 2001 - this has relapsed - on Rituximab.Normal cath from 2015 noted.  Last seen by Dr. 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 Last visit with me back in January - she felt like she was doing ok - had gotten some help with overseeing her farm. Last echo showed her EF to be normal.We updated this last summer of 2020.   Comes in today. Here alone. She is doing well. Walking 3 miles every day - has this group of lady friends that she walks with. Still doing her mowing. Some palpitations but the current dosing of her Atenolol is working well. She had recent labs - there was concerns for her lipids - does have aortic atherosclerosis noted on prior scans - I would be in favor of statin therapy as her PCP recommended. No chest pain. Breathing is good. She does note that she has this incline in her back yard that she does not  go up due to [place] - so she will drive her car down to her barn to get her mower out. I think this is a matter of conditioning. Still wearing her mask. Still getting Rituxin every 8 weeks. Overall, no real concerns.   Past Medical History:  Diagnosis Date  . Anal sphincter incompetence 12/31/2015   Manometry 12/2015  . Arthritis   . Brady-tachy syndrome ([place])   . Chest pain 10/16/13   normal coronary arteries on cath and normal EF  . Colitis, ischemic ([place])   . Colon polyp 07/29/1993   with focal adenomatous changes  . Diverticulitis   . Diverticulosis of colon   . Dyspepsia   . Dyssynergic defecation 12/31/2015   Anorectal mano 12/2015, also has rectal hypersensitivity  . Fatty liver   . GERD (gastroesophageal reflux disease)   . Heart murmur   . HLD (hyperlipidemia)   . Hypertension   . IBS (irritable bowel syndrome)   . Incontinence, feces   . MVP (mitral valve prolapse)   . nhl dx'd 07/1999/ 06/2018   chemo comp 2001; rituxin comp 2005  . Osteopenia   . Palpitation   . Rectocele   . Rectocele   . Vasovagal syncope     Past Surgical History:  Procedure Laterality Date  . ABDOMINAL HYSTERECTOMY    . ABDOMINAL SURGERY  2001    for non hodgkins lymphoma;  lymphoma in mesentary  . ANAL RECTAL MANOMETRY N/A 12/24/2015   Procedure: ANO RECTAL MANOMETRY;  Surgeon: Gatha Mayer, MD;  Location: [facility];  Service: Endoscopy;  Laterality: N/A;  . BLADDER SUSPENSION    . BUNIONECTOMY    . CHOLECYSTECTOMY  2003  . COLONOSCOPY  7/200/, 11/2007   diverticulosis  . FINE NEEDLE ASPIRATION BIOPSY Left 06/16/2018   left mesentery tissue  . LEFT HEART CATHETERIZATION WITH CORONARY ANGIOGRAM N/A 10/16/2013   Procedure: LEFT HEART CATHETERIZATION WITH CORONARY ANGIOGRAM;  Surgeon: Peter M Martinique, MD;  Location: [facility] CATH LAB;  Service: Cardiovascular;  Laterality: N/A;  . pelvic prolapse repair    . UPPER GASTROINTESTINAL ENDOSCOPY  10/2005   hiatal hernia     Medications: Current Meds    Medication Sig  . ALPRAZolam (XANAX) 0.25 MG tablet Take 1 tablet (0.25 mg total) by mouth daily as needed for anxiety.  Marland Kitchen aspirin EC 81 MG tablet Take 81 mg by mouth daily.  Marland Kitchen atenolol (TENORMIN) 25 MG tablet Take 25 mg in the AM and 12.5 mg in the PM  . fexofenadine (ALLEGRA) 180 MG tablet Take 180 mg by mouth daily as needed for allergies.   . pantoprazole (PROTONIX) 40 MG tablet TAKE 1 TABLET BY MOUTH ONCE DAILY BEFORE BREAKFAST  . Polyvinyl Alcohol-Povidone (REFRESH OP) Apply 1 drop to eye 3 (three) times daily.  . Probiotic Product (ALIGN PO) Take 1 capsule by mouth daily. Alternates between [place] per PCP orders every few months   . TURMERIC PO Take 1 capsule by mouth daily.   Marland Kitchen UNABLE TO FIND Apply topically.  . Wheat Dextrin (BENEFIBER) POWD Take 1 Dose by mouth daily.     Allergies: Allergies  Allergen Reactions  . Other Anaphylaxis    Other reaction(s): Facial swelling And face swelling And face swelling   . Sulfa Antibiotics Swelling and Anaphylaxis    And face swelling Face swelling   . Sulfonamide Derivatives Swelling    Face swelling   . Glycopyrrolate Other (See Comments)    Unable to urinate Could not urinate after it was given  . Latex Other (See Comments)    Blisters  . Methscopolamine Other (See Comments)    Unable to urinate Unable to urinate  . Ace Inhibitors Hives  . Codeine Nausea Only  . Iodine Hives  . Iohexol Hives     Desc: 50 mg benadryl prior to exam     Social History: The patient  reports that she has quit smoking. She smoked 0.33 packs per day. She has never used smokeless tobacco. She reports current alcohol use. She reports that she does not use drugs.   Family History: The patient's family history includes Colon cancer (age of onset: 37) in her father; Colon polyps in her brother and brother; Prostate cancer in her brother; Stomach cancer in her maternal grandfather and maternal uncle.   Review of  Systems: Please see the history of present illness.   All other systems are reviewed and negative.   Physical Exam: VS:  BP 124/72   Pulse 64   Ht 5\' 2"  (1.575 m)   Wt 133 lb 3.2 oz (60.4 kg)   SpO2 98%   BMI 24.36 kg/m  .  BMI Body mass index is 24.36 kg/m.  Wt Readings from Last 3 Encounters:  11/26/19 133 lb 3.2 oz (60.4 kg)  11/22/19 134 lb 11.2 oz (61.1 kg)  10/05/19 132 lb 12.8 oz (60.2 kg)  General: Pleasant. Alert and in no acute distress.  She looks younger than her stated age.  Cardiac: Regular rate and rhythm. No murmurs, rubs, or gallops. No edema.  Respiratory:  Lungs are clear to auscultation bilaterally with normal work of breathing.  GI: Soft and nontender.  MS: No deformity or atrophy. Gait and ROM intact.  Skin: Warm and dry. Color is normal.  Neuro:  Strength and sensation are intact and no gross focal deficits noted.  Psych: Alert, appropriate and with normal affect.   LABORATORY DATA:  EKG:  EKG is ordered today.  Personally reviewed by me. This demonstrates NSR with non specific ST and T wave changes - HR is 64.   Lab Results  Component Value Date   WBC 10.0 11/20/2019   HGB 14.5 11/20/2019   HCT 44.6 11/20/2019   PLT 301 11/20/2019   GLUCOSE 93 11/22/2019   CHOL 215 (H) 06/19/2019   TRIG 125 06/19/2019   HDL 53 06/19/2019   LDLCALC 140 (H) 06/19/2019   ALT 18 11/20/2019   AST 22 11/20/2019   NA 143 11/22/2019   K 4.3 11/22/2019   CL 109 11/22/2019   CREATININE 0.80 11/22/2019   BUN 20 11/22/2019   CO2 22 11/22/2019   TSH 3.110 06/19/2019   INR 0.94 06/16/2018   HGBA1C 5.3 10/05/2019     BNP (last 3 results) No results for input(s): BNP in the last 8760 hours.  ProBNP (last 3 results) No results for input(s): PROBNP in the last 8760 hours.   Other Studies Reviewed Today:  EchoStudy Conclusions6/2020  IMPRESSIONS 1. The left ventricle has normal systolic function, with an ejection fraction of 55-60%. The cavity size was  normal. Left ventricular diastolic Doppler parameters are consistent with impaired relaxation. 2. The right ventricle has normal systolic function. The cavity was normal. There is no increase in right ventricular wall thickness. 3. Borderline bileaflet prolapse of the mitral valve leaflets with trivial central mitral regurgitation. 4. The aortic valve is tricuspid. Mild sclerosis of the aortic valve. No stenosis of the aortic valve. 5. The aortic root and ascending aorta are normal in size and structure.   30-day monitor 11/4/2019sinus bradycardia to sinus tachycardia with 3 episodes of NSVT: 2 5 beats and one 6 beat  Cardiac MRI11/2019 IMPRESSION: 1. Normal left ventricular size, thickness and systolic function (LVEF =56%). There is paradoxical septal motion. There is no late gadolinium enhancement in the left ventricular myocardium.  2. Normal right ventricular size, thickness and systolic function (LVEF = 63%). There are no regional wall motion abnormalities.  3. Normal left and right atrial size.  4. Normal size of the aortic root, ascending aorta and pulmonary artery.  5. Anterior mitral valve leaflet prolapse with mitral regurgitation. Mild tricuspid regurgitation.  6. Normal pericardium. No pericardial effusion.  Normal cardiac MRI without evidence for inflammatory cardiomyopathy or ARVC.  Electronically Signed By: Ena Dawley On: 03/28/2018 19:07   CARDIAC CATHFinal Conclusions6/2015:  1. Normal coronary anatomy 2. Normal LV function  Recommendations:medical therapy.  Peter Martinique, [place]  10/16/2013, 12:01 PM  MYOVIEW 2015 Overall Impression: High risk stress nuclear study normal myocardial perfusion but patient had significant EKG changes of ischemia with hypotensive BP response to exercise and transient ischemic dilatation of the LV..  LV Ejection Fraction: 64%. LV Wall Motion: NL LV Function; NL Wall  Motion  Signed: Fransico Him, MD [facility]  ASSESSMENT& PLan:   1. HLD - aortic atherosclerosis noted on her prior CT scans -  no CAD per prior cath from 2015 - would be in favor of statin therapy - will try for 6 weeks - adding Lipitor 10 mg a day - lab in 6 weeks.   2. Chronic palpitations/NSVT noted on prior monitor - symptoms pretty stable with her current dosing of beta blocker - will continue this for now.   3. MVP - with only mild MR - would follow.   4. Recurrent non-Hodgkin's lymphoma - per oncology.   5. HTN - BP is fine - only on low dose beta blocker - would continue.   Current medicines are reviewed with the patient today.  The patient does not have concerns regarding medicines other than what has been noted above.  The following changes have been made:  See above.  Labs/ tests ordered today include:    Orders Placed This Encounter  Procedures  . Basic metabolic panel  . Hepatic function panel  . Lipid panel  . EKG 12-Lead     Disposition:   FU with me in 6 months.   Patient is agreeable to this plan and will call if any problems develop in the interim.   SignedTruitt Merle, NP  11/26/2019 8:44 AM  [facility] [address] [place], [place]  [address] Phone: [phone] Fax: [phone]

## 2019-11-20 NOTE — Progress Notes (Signed)
K+ level high at lab draw today. Dr. Benay Spice is not sure it is accurate. Confirmed w/lab there was no hemolysis in sample. Will re-collect Bmet on 7/22 per Dr. Benay Spice. Scheduling message sent.

## 2019-11-22 ENCOUNTER — Inpatient Hospital Stay: Payer: Medicare Other

## 2019-11-22 ENCOUNTER — Other Ambulatory Visit: Payer: Self-pay

## 2019-11-22 ENCOUNTER — Inpatient Hospital Stay (HOSPITAL_BASED_OUTPATIENT_CLINIC_OR_DEPARTMENT_OTHER): Payer: Medicare Other | Admitting: Oncology

## 2019-11-22 VITALS — BP 119/52 | HR 69 | Temp 99.0°F | Resp 18

## 2019-11-22 VITALS — BP 136/67 | HR 72 | Temp 97.7°F | Resp 18 | Ht 62.0 in | Wt 134.7 lb

## 2019-11-22 DIAGNOSIS — Z23 Encounter for immunization: Secondary | ICD-10-CM

## 2019-11-22 DIAGNOSIS — R109 Unspecified abdominal pain: Secondary | ICD-10-CM | POA: Diagnosis not present

## 2019-11-22 DIAGNOSIS — C8203 Follicular lymphoma grade I, intra-abdominal lymph nodes: Secondary | ICD-10-CM

## 2019-11-22 DIAGNOSIS — Z5112 Encounter for antineoplastic immunotherapy: Secondary | ICD-10-CM | POA: Diagnosis not present

## 2019-11-22 DIAGNOSIS — Z9221 Personal history of antineoplastic chemotherapy: Secondary | ICD-10-CM | POA: Diagnosis not present

## 2019-11-22 DIAGNOSIS — M8580 Other specified disorders of bone density and structure, unspecified site: Secondary | ICD-10-CM | POA: Diagnosis not present

## 2019-11-22 DIAGNOSIS — C8218 Follicular lymphoma grade II, lymph nodes of multiple sites: Secondary | ICD-10-CM | POA: Diagnosis not present

## 2019-11-22 LAB — BASIC METABOLIC PANEL - CANCER CENTER ONLY
Anion gap: 12 (ref 5–15)
BUN: 20 mg/dL (ref 8–23)
CO2: 22 mmol/L (ref 22–32)
Calcium: 10.4 mg/dL — ABNORMAL HIGH (ref 8.9–10.3)
Chloride: 109 mmol/L (ref 98–111)
Creatinine: 0.8 mg/dL (ref 0.44–1.00)
GFR, Est AFR Am: >60 mL/min (ref >60)
GFR, Estimated: >60 mL/min (ref >60)
Glucose, Bld: 93 mg/dL (ref 70–99)
Potassium: 4.3 mmol/L (ref 3.5–5.1)
Sodium: 143 mmol/L (ref 135–145)

## 2019-11-22 MED ORDER — DIPHENHYDRAMINE HCL 25 MG PO CAPS
50.0000 mg | ORAL_CAPSULE | Freq: Once | ORAL | Status: AC
  Administered 2019-11-22: 50 mg via ORAL

## 2019-11-22 MED ORDER — SODIUM CHLORIDE 0.9 % IV SOLN
375.0000 mg/m2 | Freq: Once | INTRAVENOUS | Status: AC
  Administered 2019-11-22: 600 mg via INTRAVENOUS
  Filled 2019-11-22: qty 10

## 2019-11-22 MED ORDER — ACETAMINOPHEN 325 MG PO TABS
650.0000 mg | ORAL_TABLET | Freq: Once | ORAL | Status: AC
  Administered 2019-11-22: 650 mg via ORAL

## 2019-11-22 MED ORDER — SODIUM CHLORIDE 0.9 % IV SOLN
Freq: Once | INTRAVENOUS | Status: AC
  Filled 2019-11-22: qty 250

## 2019-11-22 MED ORDER — DIPHENHYDRAMINE HCL 25 MG PO CAPS
ORAL_CAPSULE | ORAL | Status: AC
  Filled 2019-11-22: qty 2

## 2019-11-22 MED ORDER — PNEUMOCOCCAL VAC POLYVALENT 25 MCG/0.5ML IJ INJ
0.5000 mL | INJECTION | Freq: Once | INTRAMUSCULAR | Status: AC
  Administered 2019-11-22: 0.5 mL via INTRAMUSCULAR
  Filled 2019-11-22: qty 0.5

## 2019-11-22 MED ORDER — ACETAMINOPHEN 325 MG PO TABS
ORAL_TABLET | ORAL | Status: AC
  Filled 2019-11-22: qty 2

## 2019-11-22 NOTE — Progress Notes (Signed)
[facility] OFFICE PROGRESS NOTE   Diagnosis: Non-Hodgkin's lymphoma  INTERVAL HISTORY:   [name] [name] completed another treatment with rituximab on 09/27/2019.  No symptom of allergic reaction.  She feels well.  She walks 3 miles a day.  She has intermittent discomfort in the left abdomen.  Objective:  Vital signs in last 24 hours:  Blood pressure 136/67, pulse 72, temperature 97.7 F (36.5 C), temperature source Temporal, resp. rate 18, height 5\' 2"  (1.575 m), weight 134 lb 11.2 oz (61.1 kg), SpO2 100 %.    HEENT: Neck without mass Lymphatics: No cervical, supraclavicular, axillary, or inguinal nodes Resp: Lungs clear bilaterally Cardio: Regular rate and rhythm GI: No hepatosplenomegaly, no mass, mild tenderness in the left lower abdomen Vascular: No leg edema   Lab Results:  Lab Results  Component Value Date   WBC 10.0 11/20/2019   HGB 14.5 11/20/2019   HCT 44.6 11/20/2019   MCV 92.3 11/20/2019   PLT 301 11/20/2019   NEUTROABS 8.9 (H) 11/20/2019    CMP  Lab Results  Component Value Date   NA 140 11/20/2019   K 5.6 (H) 11/20/2019   CL 104 11/20/2019   CO2 26 11/20/2019   GLUCOSE 143 (H) 11/20/2019   BUN 18 11/20/2019   CREATININE 0.84 11/20/2019   CALCIUM 11.1 (H) 11/20/2019   PROT 7.3 11/20/2019   ALBUMIN 4.3 11/20/2019   AST 22 11/20/2019   ALT 18 11/20/2019   ALKPHOS 70 11/20/2019   BILITOT 0.4 11/20/2019   GFRNONAA >60 11/20/2019   GFRAA >60 11/20/2019    No results found for: CEA1  Lab Results  Component Value Date   INR 0.94 06/16/2018    Imaging:  CT ABDOMEN PELVIS W CONTRAST  Result Date: 11/20/2019 CLINICAL DATA:  Primary Cancer Type: Lymphoma Imaging Indication: Routine surveillance Interval therapy since last imaging? Yes Initial Cancer Diagnosis Date: 07/15/1999; Established by: Biopsy-proven Detailed Pathology: Non-Hodgkin's lymphomafollicular lymphoma, grade 2. Primary Tumor location: Mesentery. Recurrence? Yes;  Date(s) of recurrence: 06/16/2018; Established by: Biopsy-proven Surgeries: Hysterectomy, cholecystectomy. Chemotherapy: No Immunotherapy? Yes; Type: Rituximab; ongoing? Yes Radiation therapy? No EXAM: CT ABDOMEN AND PELVIS WITH CONTRAST TECHNIQUE: Multidetector CT imaging of the abdomen and pelvis was performed using the standard protocol following bolus administration of intravenous contrast. CONTRAST:  169mL OMNIPAQUE IOHEXOL 300 MG/ML  SOLN COMPARISON:  Most recent CT abdomen and pelvis 05/31/2019. 08/21/2009 PET-CT. FINDINGS: Lower chest: The lung bases are clear of an acute process. No worrisome pulmonary lesions. No pleural effusions. No pleural nodules. The heart is normal in size. No pericardial effusion. Small hiatal hernia is stable. Hepatobiliary: No hepatic lesions or intrahepatic biliary dilatation. The gallbladder is surgically absent. No common bile duct dilatation. Pancreas: No mass, inflammation or ductal dilatation. Spleen: Normal size. No focal lesions. Adrenals/Urinary Tract: The adrenal glands and kidneys are stable. Moderate cortical irregularity involving both kidneys likely scarring changes related to previous infection. No findings for acute pyelonephritis. No renal or obstructing ureteral calculi. No worrisome bladder lesions or calculi. Stomach/Bowel: The stomach, duodenum, small bowel and colon are unremarkable. No acute inflammatory changes, mass lesions or obstructive findings. Low lying cecum deep in the pelvis. Terminal ileum is unremarkable. The appendix is normal. Vascular/Lymphatic: Stable scattered aortic and iliac artery calcifications but no aneurysm or dissection. The branch vessels are patent. Further contraction/resolution of the small mesenteric lesion in the root of the small bowel mesentery. Vague strandy soft tissue density measures a maximum of 14.5 x 6.3 mm. This previously measured  22.5 x 7 mm. This is consistent with an area of treated lymphoma. No findings  suspicious for residual or recurrent disease. No enlarged mesenteric, retroperitoneal or pelvic lymph nodes. Reproductive: Surgically absent. Other: No free pelvic fluid collections, pelvic adenopathy or inguinal adenopathy. Musculoskeletal: No significant bony findings. Stable degenerative changes involving the spine. IMPRESSION: 1. Further contraction/resolution of the small mesenteric lesion in the root of the small bowel mesentery. This is consistent with treated lymphoma. No findings suspicious for residual or recurrent disease. 2. Stable moderate cortical irregularity involving both kidneys likely related to previous infection. No findings for acute pyelonephritis. 3. Status post cholecystectomy. No biliary dilatation. 4. Aortic atherosclerosis. Aortic Atherosclerosis (ICD10-I70.0). Electronically Signed   By: Marijo Sanes M.D.   On: 11/20/2019 12:20    Medications: I have reviewed the patient's current medications.   Assessment/Plan: 1. Non-Hodgkin's lymphoma-follicular lymphoma  Mesenteric lymph node biopsy 07/15/1999-follicular center cell lymphoma, grade 2  Treated with mitoxantrone, fludarabine, and rituximab completed in January 2002  Progressive disease in February 2004, treated with single agent rituximab February 2004 through August 2005  CTs 06/21/2014-no evidence of lymphoma  CT abdomen/pelvis 06/05/2018-enlarging left mid abdominal mesenteric mass  CT biopsy of the mesenteric mass 06/16/2018-follicular lymphoma, low-grade, CD20 positive  Cycle 1 weekly Rituxan 07/24/2018  Cycle 2 weekly Rituxan 07/31/2018  Cycle 3 weekly Rituxan 08/07/2018  Cycle 4 weekly Rituxan 08/14/2018  CT abdomen/pelvis 10/09/2018-reduction in size of left lower quadrant mesenteric mass  Cycle 1 maintenance rituximab 10/19/2018  Cycle 2 maintenance Rituxan 12/14/2018  Cycle 3 maintenance Rituxan 02/08/2019  Cycle 4 maintenance Rituxan 04/09/2019  CT abdomen/pelvis 05/31/2019-no change in jejunal  mesenteric mass, no evidence of disease progression.  Cycle 5 maintenance rituximab 06/07/2019  Cycle 6 maintenance rituximab 08/02/2019  Cycle 7 maintenance rituximab 09/27/2019  CT abdomen/pelvis 11/20/2019-further contraction of the lesion at the small bowel mesentery, no evidence of progressive lymphoma  Cycle 8 maintenance rituximab 11/22/2019 2. Irritable bowel syndrome 3. History of anal incontinence 4. Osteopenia 5. History of colon polyps 6. Weakness, flushing, presyncope and chills during Rituxan infusion 07/24/2018. 7. Mild hypercalcemia-chronic    Disposition: Ms. Vanacker appears stable.  There is no clinical evidence for progression of lymphoma.  She is tolerating the rituximab well.  The restaging CT reveals near complete resolution of the mesenteric mass.  The residual soft tissue density likely represents scar.  The plan is to continue maintenance rituximab.  I reviewed the CT images with Ms. Bies.  She will discuss obtaining a zoster vaccine with her primary provider.  She will receive a 23 valent pneumococcal vaccine today.  Ms. Witting will return for an office visit in the next cycle of rituximab in 8 weeks.  Betsy Coder, MD  11/22/2019  9:07 AM

## 2019-11-22 NOTE — Patient Instructions (Signed)
[facility] Discharge Instructions for Patients Receiving Chemotherapy  Today you received the following chemotherapy agents:  Rituxan.  To help prevent nausea and vomiting after your treatment, we encourage you to take your nausea medication as directed.   If you develop nausea and vomiting that is not controlled by your nausea medication, call the clinic.   BELOW ARE SYMPTOMS THAT SHOULD BE REPORTED IMMEDIATELY:  *FEVER GREATER THAN 100.5 F  *CHILLS WITH OR WITHOUT FEVER  NAUSEA AND VOMITING THAT IS NOT CONTROLLED WITH YOUR NAUSEA MEDICATION  *UNUSUAL SHORTNESS OF BREATH  *UNUSUAL BRUISING OR BLEEDING  TENDERNESS IN MOUTH AND THROAT WITH OR WITHOUT PRESENCE OF ULCERS  *URINARY PROBLEMS  *BOWEL PROBLEMS  UNUSUAL RASH Items with * indicate a potential emergency and should be followed up as soon as possible.  Feel free to call the clinic should you have any questions or concerns. The clinic phone number is [phone].  Please show the CHEMO ALERT CARD at check-in to the Emergency Department and triage nurse.   

## 2019-11-23 ENCOUNTER — Telehealth: Payer: Self-pay | Admitting: Oncology

## 2019-11-23 NOTE — Telephone Encounter (Signed)
Scheduled appts per 7/22 los. Pt confirmed appt date and time.

## 2019-11-26 ENCOUNTER — Ambulatory Visit (INDEPENDENT_AMBULATORY_CARE_PROVIDER_SITE_OTHER): Payer: Medicare Other | Admitting: Nurse Practitioner

## 2019-11-26 ENCOUNTER — Encounter: Payer: Self-pay | Admitting: Nurse Practitioner

## 2019-11-26 ENCOUNTER — Other Ambulatory Visit: Payer: Self-pay

## 2019-11-26 VITALS — BP 124/72 | HR 64 | Ht 62.0 in | Wt 133.2 lb

## 2019-11-26 DIAGNOSIS — I10 Essential (primary) hypertension: Secondary | ICD-10-CM | POA: Diagnosis not present

## 2019-11-26 DIAGNOSIS — R5383 Other fatigue: Secondary | ICD-10-CM | POA: Diagnosis not present

## 2019-11-26 DIAGNOSIS — R002 Palpitations: Secondary | ICD-10-CM | POA: Diagnosis not present

## 2019-11-26 DIAGNOSIS — E782 Mixed hyperlipidemia: Secondary | ICD-10-CM | POA: Diagnosis not present

## 2019-11-26 MED ORDER — ATORVASTATIN CALCIUM 10 MG PO TABS
10.0000 mg | ORAL_TABLET | Freq: Every day | ORAL | 3 refills | Status: DC
Start: 2019-11-26 — End: 2020-06-16

## 2019-11-26 NOTE — Patient Instructions (Addendum)
After Visit Summary:  We will be checking the following labs today - NONE  Fasting BMET, Lipids and LFTs in 6 weeks   Medication Instructions:    Continue with your current medicines.   I am adding Lipitor 10 mg a day - this is for your cholesterol   If you need a refill on your cardiac medications before your next appointment, please call your pharmacy.     Testing/Procedures To Be Arranged:  N/A  Follow-Up:   See me in 6 months    At [facility], you and your health needs are our priority.  As part of our continuing mission to provide you with exceptional heart care, we have created designated Provider Care Teams.  These Care Teams include your primary Cardiologist (physician) and Advanced Practice Providers (APPs -  Physician Assistants and Nurse Practitioners) who all work together to provide you with the care you need, when you need it.  Special Instructions:  . Stay safe, wash your hands for at least 20 seconds and wear a mask when needed.  . It was good to talk with you today.    Call the [place] office at [phone] if you have any questions, problems or concerns.

## 2019-12-03 ENCOUNTER — Other Ambulatory Visit: Payer: Self-pay | Admitting: Internal Medicine

## 2019-12-07 ENCOUNTER — Other Ambulatory Visit: Payer: Self-pay

## 2019-12-07 ENCOUNTER — Encounter: Payer: Self-pay | Admitting: Family Medicine

## 2019-12-07 ENCOUNTER — Ambulatory Visit (INDEPENDENT_AMBULATORY_CARE_PROVIDER_SITE_OTHER): Payer: Medicare Other | Admitting: Family Medicine

## 2019-12-07 VITALS — BP 118/76 | Temp 97.2°F | Ht 62.0 in | Wt 134.6 lb

## 2019-12-07 DIAGNOSIS — M7918 Myalgia, other site: Secondary | ICD-10-CM

## 2019-12-07 MED ORDER — VALACYCLOVIR HCL 1 G PO TABS
1000.0000 mg | ORAL_TABLET | Freq: Three times a day (TID) | ORAL | 0 refills | Status: DC
Start: 2019-12-07 — End: 2020-01-01

## 2019-12-07 MED ORDER — IBUPROFEN 600 MG PO TABS
600.0000 mg | ORAL_TABLET | Freq: Three times a day (TID) | ORAL | 0 refills | Status: DC | PRN
Start: 2019-12-07 — End: 2020-01-01

## 2019-12-07 NOTE — Progress Notes (Signed)
Subjective:    Patient ID: [name] [name], female    DOB: 05-02-1941, 79 y.o.   MRN: [id number]  HPI  Patient arrives with left shoulder pain since Wednesday. Patient reports no know injury. Patient is concerned it could be shingles due to intensity of pain but no current rash. She relates that the pain fluctuates at times it is very minimal other times more intense other positions they can go away some positions it gets worse she denies any shoulder stiffness denies any numbness or tingling the pain radiates from the rhomboid region into the upper trapezius region. Denies any cardiac symptoms currently. She is a cancer patient Pain intense Moderate oain today Pain worse in certain positions and gone isn other positions No chest tightness No doe  no sweats or fever or cough Avoids certain movements due to the pain Fall Risk  10/05/2019 06/27/2019 12/03/2015 12/01/2015 11/19/2014  Falls in the past year? 0 0 No Yes No  Number falls in past yr: - - - 1 -  Injury with Fall? - - - No -  Follow up Falls evaluation completed Falls evaluation completed - Falls prevention discussed -    Review of Systems  Constitutional: Negative for activity change and appetite change.  HENT: Negative for congestion and rhinorrhea.   Respiratory: Negative for cough and shortness of breath.   Cardiovascular: Negative for chest pain and leg swelling.  Gastrointestinal: Negative for abdominal pain, nausea and vomiting.  Musculoskeletal: Positive for back pain.  Skin: Negative for color change.  Neurological: Negative for dizziness and weakness.  Psychiatric/Behavioral: Negative for agitation and confusion.       Objective:   Physical Exam Vitals reviewed.  Constitutional:      General: She is not in acute distress. HENT:     Head: Normocephalic and atraumatic.  Eyes:     General:        Right eye: No discharge.        Left eye: No discharge.  Neck:     Trachea: No tracheal deviation.    Cardiovascular:     Rate and Rhythm: Normal rate and regular rhythm.     Heart sounds: Normal heart sounds. No murmur heard.   Pulmonary:     Effort: Pulmonary effort is normal. No respiratory distress.     Breath sounds: Normal breath sounds.  Lymphadenopathy:     Cervical: No cervical adenopathy.  Skin:    General: Skin is warm and dry.  Neurological:     Mental Status: She is alert.     Coordination: Coordination normal.  Psychiatric:        Behavior: Behavior normal.    I do not find evidence of shingles but I will give her a printed prescription for Valtrex if she breaks out with shingles       Assessment & Plan:  I believe that this is musculoskeletal The likelihood of this being aortic dissection or heart attack is low It is possible this could be related to her cancer but if this improves with anti-inflammatory over the next several days then that squarely points toward musculoskeletal If her symptoms persist then she will need to have chest x-ray spine x-ray and possible more advanced imaging The patient is to give [place] some feedback early next week how she is doing. We will communicate this to her cancer doctor  Also patient takes 81 mg aspirin not quite clear on the reason why I told her to hold this while she  is on the ibuprofen we will message her cardiology so that they can address that aspect.  She does have underlying aortic atherosclerosis

## 2019-12-31 DIAGNOSIS — M150 Primary generalized (osteo)arthritis: Secondary | ICD-10-CM | POA: Diagnosis not present

## 2019-12-31 DIAGNOSIS — M2550 Pain in unspecified joint: Secondary | ICD-10-CM | POA: Diagnosis not present

## 2019-12-31 DIAGNOSIS — M810 Age-related osteoporosis without current pathological fracture: Secondary | ICD-10-CM | POA: Diagnosis not present

## 2019-12-31 DIAGNOSIS — M79674 Pain in right toe(s): Secondary | ICD-10-CM | POA: Diagnosis not present

## 2019-12-31 DIAGNOSIS — Z6823 Body mass index (BMI) 23.0-23.9, adult: Secondary | ICD-10-CM | POA: Diagnosis not present

## 2020-01-01 ENCOUNTER — Encounter: Payer: Self-pay | Admitting: Family Medicine

## 2020-01-01 ENCOUNTER — Ambulatory Visit (INDEPENDENT_AMBULATORY_CARE_PROVIDER_SITE_OTHER): Payer: Medicare Other | Admitting: Family Medicine

## 2020-01-01 ENCOUNTER — Other Ambulatory Visit: Payer: Self-pay

## 2020-01-01 VITALS — BP 128/80 | HR 69 | Temp 97.0°F | Ht 62.0 in | Wt 132.0 lb

## 2020-01-01 DIAGNOSIS — G43909 Migraine, unspecified, not intractable, without status migrainosus: Secondary | ICD-10-CM

## 2020-01-01 DIAGNOSIS — G44019 Episodic cluster headache, not intractable: Secondary | ICD-10-CM

## 2020-01-01 DIAGNOSIS — R519 Headache, unspecified: Secondary | ICD-10-CM

## 2020-01-01 NOTE — Patient Instructions (Signed)

## 2020-01-01 NOTE — Progress Notes (Signed)
Patient ID: [name] [name], female    DOB: [birth year], 79 y.o.   MRN: [id number]   Chief Complaint  Patient presents with  . Headache   Subjective:    HPI  Pt had a terrible headache 2 days ago. Had some nausea with it. It did go away. Pain was on left side of head.  Normally doesn't have headaches. Slow progressive headache.  Not a thunder clap or sudden headache.  Not worst pain in her life.   Sharp Pain shooting through left side of head almost daily.  Has been going on for months.   Pt wants to discuss stopping aspirin. Pt states last visit with dr Nicki Reaper he mentioned she did not need to take due to her age. It could cause bleeding.   occ having fleeting sharp pain in left side forehead, intermittent and on left side the headache.  Lasting seconds to 10sec. Had noticed them a few times before the headache. +scotoma. Doesn't go down the neck. Flickering or shimmering lights in one eye, then resolved, then went to other eye.  Has h/o HNL- seeing oncologist and on treatment, q8weeks on retuxin.  Took a 325mg  asa and sat on couch all day long, then it finally went away after that. headache slightly there the next day, didn't take meds. Today, just feeling the sharp pains intermittently lasting seconds then resolving. Ct head in 2011- normal.    Medical History [name] [name] has a past medical history of Anal sphincter incompetence (12/31/2015), Arthritis, Brady-tachy syndrome ([place]), Chest pain (10/16/13), Colitis, ischemic ([place]), Colon polyp (07/29/1993), Diverticulitis, Diverticulosis of colon, Dyspepsia, Dyssynergic defecation (12/31/2015), Fatty liver, GERD (gastroesophageal reflux disease), Heart murmur, HLD (hyperlipidemia), Hypertension, IBS (irritable bowel syndrome), Incontinence, feces, MVP (mitral valve prolapse), nhl (dx'd 07/1999/ 06/2018), Osteopenia, Palpitation, Rectocele, Rectocele, and Vasovagal syncope.   Outpatient Encounter Medications as of 01/01/2020  Medication Sig    . ALPRAZolam (XANAX) 0.25 MG tablet Take 1 tablet (0.25 mg total) by mouth daily as needed for anxiety.  Marland Kitchen aspirin EC 81 MG tablet Take 81 mg by mouth daily.  Marland Kitchen atenolol (TENORMIN) 25 MG tablet Take 25 mg in the AM and 12.5 mg in the PM  . atorvastatin (LIPITOR) 10 MG tablet Take 1 tablet (10 mg total) by mouth daily.  . fexofenadine (ALLEGRA) 180 MG tablet Take 180 mg by mouth daily as needed for allergies.   . pantoprazole (PROTONIX) 40 MG tablet TAKE 1 TABLET BY MOUTH ONCE DAILY BEFORE BREAKFAST  . Polyvinyl Alcohol-Povidone (REFRESH OP) Apply 1 drop to eye 3 (three) times daily.  . Probiotic Product (ALIGN PO) Take 1 capsule by mouth daily. Alternates between [place] per PCP orders every few months   . TURMERIC PO Take 1 capsule by mouth daily.   Marland Kitchen UNABLE TO FIND Apply topically.  . Wheat Dextrin (BENEFIBER) POWD Take 1 Dose by mouth daily.  . [DISCONTINUED] ibuprofen (ADVIL) 600 MG tablet Take 1 tablet (600 mg total) by mouth every 8 (eight) hours as needed.  . [DISCONTINUED] valACYclovir (VALTREX) 1000 MG tablet Take 1 tablet (1,000 mg total) by mouth 3 (three) times daily.   No facility-administered encounter medications on file as of 01/01/2020.     Review of Systems  Constitutional: Negative for chills and fever.  HENT: Negative for congestion, rhinorrhea and sore throat.   Respiratory: Negative for cough, shortness of breath and wheezing.   Cardiovascular: Negative for chest pain and leg swelling.  Gastrointestinal: Negative for abdominal pain, diarrhea,  nausea and vomiting.  Genitourinary: Negative for dysuria and frequency.  Musculoskeletal: Negative for arthralgias, back pain and gait problem.  Skin: Negative for rash.  Neurological: Positive for headaches (intermittent). Negative for dizziness, tremors, seizures, syncope, facial asymmetry, speech difficulty, weakness, light-headedness and numbness.     Vitals BP 128/80   Pulse 69   Temp (!) 97 F  (36.1 C)   Ht 5\' 2"  (1.575 m)   Wt 132 lb (59.9 kg)   SpO2 98%   BMI 24.14 kg/m   Objective:   Physical Exam Vitals and nursing note reviewed.  Constitutional:      General: She is not in acute distress.    Appearance: Normal appearance. She is well-developed. She is not ill-appearing.  HENT:     Head: Normocephalic and atraumatic.     Nose: Nose normal.     Mouth/Throat:     Mouth: Mucous membranes are moist.     Pharynx: Oropharynx is clear.  Eyes:     Extraocular Movements: Extraocular movements intact.     Conjunctiva/sclera: Conjunctivae normal.     Pupils: Pupils are equal, round, and reactive to light.  Cardiovascular:     Rate and Rhythm: Normal rate and regular rhythm.     Pulses: Normal pulses.     Heart sounds: Normal heart sounds.  Pulmonary:     Effort: Pulmonary effort is normal.     Breath sounds: Normal breath sounds. No wheezing, rhonchi or rales.  Musculoskeletal:        General: Normal range of motion.     Right lower leg: No edema.     Left lower leg: No edema.     Comments: MS upper and lower ext 5/5 bilaterally.  Skin:    General: Skin is warm and dry.     Findings: No lesion or rash.  Neurological:     General: No focal deficit present.     Mental Status: She is alert and oriented to person, place, and time. Mental status is at baseline.     Cranial Nerves: No cranial nerve deficit, dysarthria or facial asymmetry.     Sensory: No sensory deficit.     Motor: No weakness.     Coordination: Coordination normal.     Gait: Gait normal.  Psychiatric:        Mood and Affect: Mood normal. Mood is not anxious.        Behavior: Behavior normal.      Assessment and Plan   1. Episodic cluster headache, not intractable - CT Head Wo Contrast; Future  2. Migraine without status migrainosus, not intractable, unspecified migraine type - CT Head Wo Contrast; Future  3. Sharp headache - CT Head Wo Contrast; Future   Tylenol or ibuprofen prn for  headache.  Avoid ASA for headaches.  Reviewed stroke symptoms and what to look for. Likely migraine vs. Occipital neuralgia. Pt to call or rto if worsening pain or severe headache.  If having vomiting and severe headache, needs to go to ER. Pt in agreement.  F/u prn. Will call after CT head to give results.

## 2020-01-08 ENCOUNTER — Other Ambulatory Visit: Payer: Medicare Other | Admitting: *Deleted

## 2020-01-08 ENCOUNTER — Other Ambulatory Visit: Payer: Self-pay

## 2020-01-08 DIAGNOSIS — E782 Mixed hyperlipidemia: Secondary | ICD-10-CM | POA: Diagnosis not present

## 2020-01-08 LAB — HEPATIC FUNCTION PANEL
ALT: 18 IU/L (ref 0–32)
AST: 18 IU/L (ref 0–40)
Albumin: 4.4 g/dL (ref 3.7–4.7)
Alkaline Phosphatase: 81 IU/L (ref 48–121)
Bilirubin Total: 0.2 mg/dL (ref 0.0–1.2)
Bilirubin, Direct: 0.07 mg/dL (ref 0.00–0.40)
Total Protein: 6.2 g/dL (ref 6.0–8.5)

## 2020-01-08 LAB — BASIC METABOLIC PANEL
BUN/Creatinine Ratio: 19 (ref 12–28)
BUN: 16 mg/dL (ref 8–27)
CO2: 24 mmol/L (ref 20–29)
Calcium: 10.8 mg/dL — ABNORMAL HIGH (ref 8.7–10.3)
Chloride: 104 mmol/L (ref 96–106)
Creatinine, Ser: 0.86 mg/dL (ref 0.57–1.00)
GFR calc Af Amer: 75 mL/min/{1.73_m2} (ref >59)
GFR calc non Af Amer: 65 mL/min/{1.73_m2} (ref >59)
Glucose: 67 mg/dL (ref 65–99)
Potassium: 4.4 mmol/L (ref 3.5–5.2)
Sodium: 142 mmol/L (ref 134–144)

## 2020-01-08 LAB — LIPID PANEL
Chol/HDL Ratio: 3.5 ratio (ref 0.0–4.4)
Cholesterol, Total: 178 mg/dL (ref 100–199)
HDL: 51 mg/dL
LDL Chol Calc (NIH): 101 mg/dL — ABNORMAL HIGH (ref 0–99)
Triglycerides: 149 mg/dL (ref 0–149)
VLDL Cholesterol Cal: 26 mg/dL (ref 5–40)

## 2020-01-09 DIAGNOSIS — M810 Age-related osteoporosis without current pathological fracture: Secondary | ICD-10-CM | POA: Diagnosis not present

## 2020-01-13 ENCOUNTER — Other Ambulatory Visit: Payer: Self-pay | Admitting: Oncology

## 2020-01-17 ENCOUNTER — Inpatient Hospital Stay (HOSPITAL_BASED_OUTPATIENT_CLINIC_OR_DEPARTMENT_OTHER): Payer: Medicare Other | Admitting: Oncology

## 2020-01-17 ENCOUNTER — Inpatient Hospital Stay: Payer: Medicare Other

## 2020-01-17 ENCOUNTER — Other Ambulatory Visit: Payer: Self-pay

## 2020-01-17 ENCOUNTER — Inpatient Hospital Stay: Payer: Medicare Other | Attending: Oncology

## 2020-01-17 VITALS — BP 123/61 | HR 65 | Temp 98.3°F | Resp 20

## 2020-01-17 VITALS — BP 125/68 | HR 76 | Temp 97.0°F | Resp 16 | Ht 62.0 in | Wt 134.0 lb

## 2020-01-17 DIAGNOSIS — M8580 Other specified disorders of bone density and structure, unspecified site: Secondary | ICD-10-CM | POA: Diagnosis not present

## 2020-01-17 DIAGNOSIS — R519 Headache, unspecified: Secondary | ICD-10-CM | POA: Insufficient documentation

## 2020-01-17 DIAGNOSIS — Z23 Encounter for immunization: Secondary | ICD-10-CM | POA: Diagnosis not present

## 2020-01-17 DIAGNOSIS — Z79899 Other long term (current) drug therapy: Secondary | ICD-10-CM | POA: Insufficient documentation

## 2020-01-17 DIAGNOSIS — R109 Unspecified abdominal pain: Secondary | ICD-10-CM | POA: Diagnosis not present

## 2020-01-17 DIAGNOSIS — Z5112 Encounter for antineoplastic immunotherapy: Secondary | ICD-10-CM | POA: Insufficient documentation

## 2020-01-17 DIAGNOSIS — C8203 Follicular lymphoma grade I, intra-abdominal lymph nodes: Secondary | ICD-10-CM

## 2020-01-17 DIAGNOSIS — R110 Nausea: Secondary | ICD-10-CM | POA: Insufficient documentation

## 2020-01-17 LAB — CBC WITH DIFFERENTIAL (CANCER CENTER ONLY)
Abs Immature Granulocytes: 0.03 10*3/uL (ref 0.00–0.07)
Basophils Absolute: 0.1 10*3/uL (ref 0.0–0.1)
Basophils Relative: 1 %
Eosinophils Absolute: 0.2 10*3/uL (ref 0.0–0.5)
Eosinophils Relative: 2 %
HCT: 41.5 % (ref 36.0–46.0)
Hemoglobin: 13.8 g/dL (ref 12.0–15.0)
Immature Granulocytes: 0 %
Lymphocytes Relative: 13 %
Lymphs Abs: 1.2 10*3/uL (ref 0.7–4.0)
MCH: 30.1 pg (ref 26.0–34.0)
MCHC: 33.3 g/dL (ref 30.0–36.0)
MCV: 90.4 fL (ref 80.0–100.0)
Monocytes Absolute: 1.1 10*3/uL — ABNORMAL HIGH (ref 0.1–1.0)
Monocytes Relative: 12 %
Neutro Abs: 6.7 10*3/uL (ref 1.7–7.7)
Neutrophils Relative %: 72 %
Platelet Count: 234 10*3/uL (ref 150–400)
RBC: 4.59 MIL/uL (ref 3.87–5.11)
RDW: 13 % (ref 11.5–15.5)
WBC Count: 9.2 10*3/uL (ref 4.0–10.5)
nRBC: 0 % (ref 0.0–0.2)

## 2020-01-17 LAB — CMP (CANCER CENTER ONLY)
ALT: 19 U/L (ref 0–44)
AST: 23 U/L (ref 15–41)
Albumin: 3.9 g/dL (ref 3.5–5.0)
Alkaline Phosphatase: 72 U/L (ref 38–126)
Anion gap: 6 (ref 5–15)
BUN: 15 mg/dL (ref 8–23)
CO2: 29 mmol/L (ref 22–32)
Calcium: 10.7 mg/dL — ABNORMAL HIGH (ref 8.9–10.3)
Chloride: 107 mmol/L (ref 98–111)
Creatinine: 0.8 mg/dL (ref 0.44–1.00)
GFR, Est AFR Am: >60 mL/min (ref >60)
GFR, Estimated: >60 mL/min (ref >60)
Glucose, Bld: 87 mg/dL (ref 70–99)
Potassium: 4.2 mmol/L (ref 3.5–5.1)
Sodium: 142 mmol/L (ref 135–145)
Total Bilirubin: 0.3 mg/dL (ref 0.3–1.2)
Total Protein: 6.7 g/dL (ref 6.5–8.1)

## 2020-01-17 LAB — LACTATE DEHYDROGENASE: LDH: 184 U/L (ref 98–192)

## 2020-01-17 MED ORDER — ACETAMINOPHEN 325 MG PO TABS
ORAL_TABLET | ORAL | Status: AC
  Filled 2020-01-17: qty 2

## 2020-01-17 MED ORDER — INFLUENZA VAC A&B SA ADJ QUAD 0.5 ML IM PRSY
PREFILLED_SYRINGE | INTRAMUSCULAR | Status: AC
  Filled 2020-01-17: qty 0.5

## 2020-01-17 MED ORDER — INFLUENZA VAC A&B SA ADJ QUAD 0.5 ML IM PRSY
0.5000 mL | PREFILLED_SYRINGE | Freq: Once | INTRAMUSCULAR | Status: AC
  Administered 2020-01-17: 0.5 mL via INTRAMUSCULAR

## 2020-01-17 MED ORDER — ACETAMINOPHEN 325 MG PO TABS
650.0000 mg | ORAL_TABLET | Freq: Once | ORAL | Status: AC
  Administered 2020-01-17: 650 mg via ORAL

## 2020-01-17 MED ORDER — SODIUM CHLORIDE 0.9 % IV SOLN
Freq: Once | INTRAVENOUS | Status: AC
  Filled 2020-01-17: qty 250

## 2020-01-17 MED ORDER — DIPHENHYDRAMINE HCL 25 MG PO CAPS
ORAL_CAPSULE | ORAL | Status: AC
  Filled 2020-01-17: qty 2

## 2020-01-17 MED ORDER — DIPHENHYDRAMINE HCL 25 MG PO CAPS
50.0000 mg | ORAL_CAPSULE | Freq: Once | ORAL | Status: AC
  Administered 2020-01-17: 50 mg via ORAL

## 2020-01-17 MED ORDER — SODIUM CHLORIDE 0.9 % IV SOLN
375.0000 mg/m2 | Freq: Once | INTRAVENOUS | Status: AC
  Administered 2020-01-17: 600 mg via INTRAVENOUS
  Filled 2020-01-17: qty 50

## 2020-01-17 NOTE — Progress Notes (Signed)
[facility] OFFICE PROGRESS NOTE   Diagnosis: Non-Hodgkin's lymphoma  INTERVAL HISTORY:   [name] [name] returns as scheduled.  She completed another treatment with rituximab on 11/22/2019.  No symptom of allergic reaction.  She feels well.  Occasional discomfort in the left abdomen.  She recently developed frontal headaches.  She had an episode of nausea when she first had a headache.  She has been evaluated by her primary provider and is scheduled for a head CT later this week.  Objective:  Vital signs in last 24 hours:  Blood pressure 125/68, pulse 76, temperature (!) 97 F (36.1 C), temperature source Tympanic, resp. rate 16, height 5\' 2"  (1.575 m), weight 134 lb (60.8 kg), SpO2 100 %.    HEENT: No sinus tenderness Lymphatics: No cervical, supraclavicular, axillary, or inguinal nodes Resp: Lungs clear bilaterally Cardio: Regular rate and rhythm GI: No mass, mild tenderness in the lateral left abdomen, no hepatosplenomegaly Vascular: No leg edema   Lab Results:  Lab Results  Component Value Date   WBC 9.2 01/17/2020   HGB 13.8 01/17/2020   HCT 41.5 01/17/2020   MCV 90.4 01/17/2020   PLT 234 01/17/2020   NEUTROABS 6.7 01/17/2020    CMP  Lab Results  Component Value Date   NA 142 01/17/2020   K 4.2 01/17/2020   CL 107 01/17/2020   CO2 29 01/17/2020   GLUCOSE 87 01/17/2020   BUN 15 01/17/2020   CREATININE 0.80 01/17/2020   CALCIUM 10.7 (H) 01/17/2020   PROT 6.7 01/17/2020   ALBUMIN 3.9 01/17/2020   AST 23 01/17/2020   ALT 19 01/17/2020   ALKPHOS 72 01/17/2020   BILITOT 0.3 01/17/2020   GFRNONAA >60 01/17/2020   GFRAA >60 01/17/2020    No results found for: CEA1  Lab Results  Component Value Date   INR 0.94 06/16/2018    Imaging:  No results found.  Medications: I have reviewed the patient's current medications.   Assessment/Plan: 1. Non-Hodgkin's lymphoma-follicular lymphoma  Mesenteric lymph node biopsy 07/15/1999-follicular  center cell lymphoma, grade 2  Treated with mitoxantrone, fludarabine, and rituximab completed in January 2002  Progressive disease in February 2004, treated with single agent rituximab February 2004 through August 2005  CTs 06/21/2014-no evidence of lymphoma  CT abdomen/pelvis 06/05/2018-enlarging left mid abdominal mesenteric mass  CT biopsy of the mesenteric mass 06/16/2018-follicular lymphoma, low-grade, CD20 positive  Cycle 1 weekly Rituxan 07/24/2018  Cycle 2 weekly Rituxan 07/31/2018  Cycle 3 weekly Rituxan 08/07/2018  Cycle 4 weekly Rituxan 08/14/2018  CT abdomen/pelvis 10/09/2018-reduction in size of left lower quadrant mesenteric mass  Cycle 1 maintenance rituximab 10/19/2018  Cycle 2 maintenance Rituxan 12/14/2018  Cycle 3 maintenance Rituxan 02/08/2019  Cycle 4 maintenance Rituxan 04/09/2019  CT abdomen/pelvis 05/31/2019-no change in jejunal mesenteric mass, no evidence of disease progression.  Cycle 5 maintenance rituximab 06/07/2019  Cycle 6 maintenance rituximab 08/02/2019  Cycle 7 maintenance rituximab 09/27/2019  CT abdomen/pelvis 11/20/2019-further contraction of the lesion at the small bowel mesentery, no evidence of progressive lymphoma  Cycle 8 maintenance rituximab 11/22/2019  Cycle 9 maintenance rituximab 01/17/2020 2. Irritable bowel syndrome 3. History of anal incontinence 4. Osteopenia 5. History of colon polyps 6. Weakness, flushing, presyncope and chills during Rituxan infusion 07/24/2018. 7. Mild hypercalcemia-chronic     Disposition: [name] appears unchanged.  She will complete another treatment with rituximab today.  There is no clinical evidence for progression of lymphoma.  She will return for an office visit and rituximab in 8  weeks.  She received an influenza vaccine today.  The headaches are likely a benign finding.  She will have a head CT later this week.  Betsy Coder, MD  01/17/2020  10:38 AM

## 2020-01-17 NOTE — Patient Instructions (Signed)
[facility] Discharge Instructions for Patients Receiving Chemotherapy  Today you received the following immunotherapy agent: Rituximab (Rituxan)  To help prevent nausea and vomiting after your treatment, we encourage you to take your nausea medication as directed by your MD.   If you develop nausea and vomiting that is not controlled by your nausea medication, call the clinic.   BELOW ARE SYMPTOMS THAT SHOULD BE REPORTED IMMEDIATELY:  *FEVER GREATER THAN 100.5 F  *CHILLS WITH OR WITHOUT FEVER  NAUSEA AND VOMITING THAT IS NOT CONTROLLED WITH YOUR NAUSEA MEDICATION  *UNUSUAL SHORTNESS OF BREATH  *UNUSUAL BRUISING OR BLEEDING  TENDERNESS IN MOUTH AND THROAT WITH OR WITHOUT PRESENCE OF ULCERS  *URINARY PROBLEMS  *BOWEL PROBLEMS  UNUSUAL RASH Items with * indicate a potential emergency and should be followed up as soon as possible.  Feel free to call the clinic should you have any questions or concerns. The clinic phone number is [phone].  Please show the CHEMO ALERT CARD at check-in to the Emergency Department and triage nurse.  Influenza Virus Vaccine injection What is this medicine? INFLUENZA VIRUS VACCINE (in floo EN zuh VAHY ruhs vak SEEN) helps to reduce the risk of getting influenza also known as the flu. The vaccine only helps protect you against some strains of the flu. This medicine may be used for other purposes; ask your health care provider or pharmacist if you have questions. COMMON BRAND NAME(S): Afluria, Afluria Quadrivalent, Agriflu, Alfuria, FLUAD, Fluarix, Fluarix Quadrivalent, Flublok, Flublok Quadrivalent, FLUCELVAX, FLUCELVAX Quadrivalent, Flulaval, Flulaval Quadrivalent, Fluvirin, Fluzone, Fluzone High-Dose, Fluzone Intradermal, Fluzone Quadrivalent What should I tell my health care provider before I take this medicine? They need to know if you have any of these conditions:  bleeding disorder like hemophilia  fever or  infection  Guillain-Barre syndrome or other neurological problems  immune system problems  infection with the human immunodeficiency virus (HIV) or AIDS  low blood platelet counts  multiple sclerosis  an unusual or allergic reaction to influenza virus vaccine, latex, other medicines, foods, dyes, or preservatives. Different brands of vaccines contain different allergens. Some may contain latex or eggs. Talk to your doctor about your allergies to make sure that you get the right vaccine.  pregnant or trying to get pregnant  breast-feeding How should I use this medicine? This vaccine is for injection into a muscle or under the skin. It is given by a health care professional. A copy of Vaccine Information Statements will be given before each vaccination. Read this sheet carefully each time. The sheet may change frequently. Talk to your healthcare provider to see which vaccines are right for you. Some vaccines should not be used in all age groups. Overdosage: If you think you have taken too much of this medicine contact a poison control center or emergency room at once. NOTE: This medicine is only for you. Do not share this medicine with others. What if I miss a dose? This does not apply. What may interact with this medicine?  chemotherapy or radiation therapy  medicines that lower your immune system like etanercept, anakinra, infliximab, and adalimumab  medicines that treat or prevent blood clots like warfarin  phenytoin  steroid medicines like prednisone or cortisone  theophylline  vaccines This list may not describe all possible interactions. Give your health care provider a list of all the medicines, herbs, non-prescription drugs, or dietary supplements you use. Also tell them if you smoke, drink alcohol, or use illegal drugs. Some items may interact with   your medicine. What should I watch for while using this medicine? Report any side effects that do not go away within 3  days to your doctor or health care professional. Call your health care provider if any unusual symptoms occur within 6 weeks of receiving this vaccine. You may still catch the flu, but the illness is not usually as bad. You cannot get the flu from the vaccine. The vaccine will not protect against colds or other illnesses that may cause fever. The vaccine is needed every year. What side effects may I notice from receiving this medicine? Side effects that you should report to your doctor or health care professional as soon as possible:  allergic reactions like skin rash, itching or hives, swelling of the face, lips, or tongue Side effects that usually do not require medical attention (report to your doctor or health care professional if they continue or are bothersome):  fever  headache  muscle aches and pains  pain, tenderness, redness, or swelling at the injection site  tiredness This list may not describe all possible side effects. Call your doctor for medical advice about side effects. You may report side effects to FDA at 1-800-FDA-1088. Where should I keep my medicine? The vaccine will be given by a health care professional in a clinic, pharmacy, doctor's office, or other health care setting. You will not be given vaccine doses to store at home. NOTE: This sheet is a summary. It may not cover all possible information. If you have questions about this medicine, talk to your doctor, pharmacist, or health care provider.  2020 Elsevier/Gold Standard (2018-03-14 08:45:43)   

## 2020-01-18 ENCOUNTER — Telehealth: Payer: Self-pay | Admitting: Oncology

## 2020-01-18 ENCOUNTER — Ambulatory Visit (HOSPITAL_COMMUNITY)
Admission: RE | Admit: 2020-01-18 | Discharge: 2020-01-18 | Disposition: A | Discharge status: Home / Self Care | Payer: Medicare Other | Source: Ambulatory Visit | Attending: Family Medicine | Admitting: Family Medicine

## 2020-01-18 DIAGNOSIS — G43909 Migraine, unspecified, not intractable, without status migrainosus: Secondary | ICD-10-CM | POA: Insufficient documentation

## 2020-01-18 DIAGNOSIS — G44019 Episodic cluster headache, not intractable: Secondary | ICD-10-CM | POA: Diagnosis not present

## 2020-01-18 DIAGNOSIS — R519 Headache, unspecified: Secondary | ICD-10-CM | POA: Insufficient documentation

## 2020-01-18 DIAGNOSIS — R4182 Altered mental status, unspecified: Secondary | ICD-10-CM | POA: Diagnosis not present

## 2020-01-18 NOTE — Telephone Encounter (Signed)
Scheduled appointments per 9/16 los. Called patient, no answer. Left a message for patient on voicemail with appointment date and times.

## 2020-01-21 ENCOUNTER — Other Ambulatory Visit: Payer: Self-pay | Admitting: *Deleted

## 2020-01-21 DIAGNOSIS — R519 Headache, unspecified: Secondary | ICD-10-CM

## 2020-01-24 ENCOUNTER — Encounter: Payer: Self-pay | Admitting: Neurology

## 2020-01-24 ENCOUNTER — Encounter: Payer: Self-pay | Admitting: Family Medicine

## 2020-01-24 DIAGNOSIS — G245 Blepharospasm: Secondary | ICD-10-CM | POA: Diagnosis not present

## 2020-01-25 ENCOUNTER — Encounter: Payer: Self-pay | Admitting: Neurology

## 2020-01-30 ENCOUNTER — Other Ambulatory Visit (HOSPITAL_COMMUNITY): Payer: Medicare Other

## 2020-02-12 DIAGNOSIS — D2272 Melanocytic nevi of left lower limb, including hip: Secondary | ICD-10-CM | POA: Diagnosis not present

## 2020-02-12 DIAGNOSIS — L821 Other seborrheic keratosis: Secondary | ICD-10-CM | POA: Diagnosis not present

## 2020-02-12 DIAGNOSIS — L814 Other melanin hyperpigmentation: Secondary | ICD-10-CM | POA: Diagnosis not present

## 2020-02-12 DIAGNOSIS — L570 Actinic keratosis: Secondary | ICD-10-CM | POA: Diagnosis not present

## 2020-02-12 DIAGNOSIS — L578 Other skin changes due to chronic exposure to nonionizing radiation: Secondary | ICD-10-CM | POA: Diagnosis not present

## 2020-02-12 DIAGNOSIS — D1721 Benign lipomatous neoplasm of skin and subcutaneous tissue of right arm: Secondary | ICD-10-CM | POA: Diagnosis not present

## 2020-02-12 DIAGNOSIS — D485 Neoplasm of uncertain behavior of skin: Secondary | ICD-10-CM | POA: Diagnosis not present

## 2020-02-19 DIAGNOSIS — H43392 Other vitreous opacities, left eye: Secondary | ICD-10-CM | POA: Diagnosis not present

## 2020-02-19 DIAGNOSIS — Z79899 Other long term (current) drug therapy: Secondary | ICD-10-CM | POA: Diagnosis not present

## 2020-02-19 DIAGNOSIS — H469 Unspecified optic neuritis: Secondary | ICD-10-CM | POA: Diagnosis not present

## 2020-02-19 DIAGNOSIS — H43813 Vitreous degeneration, bilateral: Secondary | ICD-10-CM | POA: Diagnosis not present

## 2020-02-19 DIAGNOSIS — H35342 Macular cyst, hole, or pseudohole, left eye: Secondary | ICD-10-CM | POA: Diagnosis not present

## 2020-02-19 DIAGNOSIS — H2513 Age-related nuclear cataract, bilateral: Secondary | ICD-10-CM | POA: Diagnosis not present

## 2020-02-19 DIAGNOSIS — H268 Other specified cataract: Secondary | ICD-10-CM | POA: Diagnosis not present

## 2020-02-19 DIAGNOSIS — H35033 Hypertensive retinopathy, bilateral: Secondary | ICD-10-CM | POA: Diagnosis not present

## 2020-02-29 ENCOUNTER — Other Ambulatory Visit: Payer: Self-pay | Admitting: Family Medicine

## 2020-02-29 DIAGNOSIS — Z1231 Encounter for screening mammogram for malignant neoplasm of breast: Secondary | ICD-10-CM

## 2020-03-09 ENCOUNTER — Other Ambulatory Visit: Payer: Self-pay | Admitting: Oncology

## 2020-03-11 ENCOUNTER — Encounter: Payer: Self-pay | Admitting: Oncology

## 2020-03-13 ENCOUNTER — Other Ambulatory Visit: Payer: Self-pay

## 2020-03-13 ENCOUNTER — Inpatient Hospital Stay: Payer: Medicare Other | Attending: Oncology

## 2020-03-13 ENCOUNTER — Inpatient Hospital Stay (HOSPITAL_BASED_OUTPATIENT_CLINIC_OR_DEPARTMENT_OTHER): Payer: Medicare Other | Admitting: Nurse Practitioner

## 2020-03-13 ENCOUNTER — Inpatient Hospital Stay: Payer: Medicare Other

## 2020-03-13 ENCOUNTER — Encounter: Payer: Self-pay | Admitting: Nurse Practitioner

## 2020-03-13 VITALS — BP 130/65 | HR 72 | Temp 97.0°F | Resp 16 | Ht 62.0 in | Wt 135.2 lb

## 2020-03-13 VITALS — BP 126/52 | HR 66 | Temp 98.7°F | Resp 18

## 2020-03-13 DIAGNOSIS — C8203 Follicular lymphoma grade I, intra-abdominal lymph nodes: Secondary | ICD-10-CM

## 2020-03-13 DIAGNOSIS — Z79899 Other long term (current) drug therapy: Secondary | ICD-10-CM | POA: Diagnosis not present

## 2020-03-13 DIAGNOSIS — M8580 Other specified disorders of bone density and structure, unspecified site: Secondary | ICD-10-CM | POA: Insufficient documentation

## 2020-03-13 DIAGNOSIS — Z5112 Encounter for antineoplastic immunotherapy: Secondary | ICD-10-CM | POA: Diagnosis not present

## 2020-03-13 DIAGNOSIS — Z1231 Encounter for screening mammogram for malignant neoplasm of breast: Secondary | ICD-10-CM

## 2020-03-13 LAB — CBC WITH DIFFERENTIAL (CANCER CENTER ONLY)
Abs Immature Granulocytes: 0.02 10*3/uL (ref 0.00–0.07)
Basophils Absolute: 0.1 10*3/uL (ref 0.0–0.1)
Basophils Relative: 1 %
Eosinophils Absolute: 0.2 10*3/uL (ref 0.0–0.5)
Eosinophils Relative: 2 %
HCT: 40.4 % (ref 36.0–46.0)
Hemoglobin: 13.5 g/dL (ref 12.0–15.0)
Immature Granulocytes: 0 %
Lymphocytes Relative: 17 %
Lymphs Abs: 1.3 10*3/uL (ref 0.7–4.0)
MCH: 30.3 pg (ref 26.0–34.0)
MCHC: 33.4 g/dL (ref 30.0–36.0)
MCV: 90.6 fL (ref 80.0–100.0)
Monocytes Absolute: 1 10*3/uL (ref 0.1–1.0)
Monocytes Relative: 13 %
Neutro Abs: 5.4 10*3/uL (ref 1.7–7.7)
Neutrophils Relative %: 67 %
Platelet Count: 263 10*3/uL (ref 150–400)
RBC: 4.46 MIL/uL (ref 3.87–5.11)
RDW: 12.9 % (ref 11.5–15.5)
WBC Count: 8.1 10*3/uL (ref 4.0–10.5)
nRBC: 0 % (ref 0.0–0.2)

## 2020-03-13 LAB — CMP (CANCER CENTER ONLY)
ALT: 36 U/L (ref 0–44)
AST: 33 U/L (ref 15–41)
Albumin: 3.9 g/dL (ref 3.5–5.0)
Alkaline Phosphatase: 70 U/L (ref 38–126)
Anion gap: 7 (ref 5–15)
BUN: 17 mg/dL (ref 8–23)
CO2: 28 mmol/L (ref 22–32)
Calcium: 10.1 mg/dL (ref 8.9–10.3)
Chloride: 107 mmol/L (ref 98–111)
Creatinine: 0.78 mg/dL (ref 0.44–1.00)
GFR, Estimated: >60 mL/min (ref >60)
Glucose, Bld: 83 mg/dL (ref 70–99)
Potassium: 4.1 mmol/L (ref 3.5–5.1)
Sodium: 142 mmol/L (ref 135–145)
Total Bilirubin: 0.5 mg/dL (ref 0.3–1.2)
Total Protein: 6.5 g/dL (ref 6.5–8.1)

## 2020-03-13 LAB — LACTATE DEHYDROGENASE: LDH: 244 U/L — ABNORMAL HIGH (ref 98–192)

## 2020-03-13 MED ORDER — SODIUM CHLORIDE 0.9 % IV SOLN
375.0000 mg/m2 | Freq: Once | INTRAVENOUS | Status: AC
  Administered 2020-03-13: 600 mg via INTRAVENOUS
  Filled 2020-03-13: qty 50

## 2020-03-13 MED ORDER — ACETAMINOPHEN 325 MG PO TABS
650.0000 mg | ORAL_TABLET | Freq: Once | ORAL | Status: AC
  Administered 2020-03-13: 650 mg via ORAL

## 2020-03-13 MED ORDER — DIPHENHYDRAMINE HCL 25 MG PO CAPS
50.0000 mg | ORAL_CAPSULE | Freq: Once | ORAL | Status: AC
  Administered 2020-03-13: 50 mg via ORAL

## 2020-03-13 MED ORDER — ACETAMINOPHEN 325 MG PO TABS
ORAL_TABLET | ORAL | Status: AC
  Filled 2020-03-13: qty 2

## 2020-03-13 MED ORDER — SODIUM CHLORIDE 0.9 % IV SOLN
Freq: Once | INTRAVENOUS | Status: AC
  Filled 2020-03-13: qty 250

## 2020-03-13 MED ORDER — DIPHENHYDRAMINE HCL 25 MG PO CAPS
ORAL_CAPSULE | ORAL | Status: AC
  Filled 2020-03-13: qty 2

## 2020-03-13 NOTE — Patient Instructions (Signed)
[facility] Discharge Instructions for Patients Receiving Chemotherapy  Today you received the following immunotherapy agent: Rituximab (Rituxan)  To help prevent nausea and vomiting after your treatment, we encourage you to take your nausea medication as directed by your MD.   If you develop nausea and vomiting that is not controlled by your nausea medication, call the clinic.   BELOW ARE SYMPTOMS THAT SHOULD BE REPORTED IMMEDIATELY:  *FEVER GREATER THAN 100.5 F  *CHILLS WITH OR WITHOUT FEVER  NAUSEA AND VOMITING THAT IS NOT CONTROLLED WITH YOUR NAUSEA MEDICATION  *UNUSUAL SHORTNESS OF BREATH  *UNUSUAL BRUISING OR BLEEDING  TENDERNESS IN MOUTH AND THROAT WITH OR WITHOUT PRESENCE OF ULCERS  *URINARY PROBLEMS  *BOWEL PROBLEMS  UNUSUAL RASH Items with * indicate a potential emergency and should be followed up as soon as possible.  Feel free to call the clinic should you have any questions or concerns. The clinic phone number is [phone].  Please show the [place] at check-in to the Emergency Department and triage nurse.  Influenza Virus Vaccine injection What is this medicine? INFLUENZA VIRUS VACCINE (in floo EN zuh VAHY ruhs vak SEEN) helps to reduce the risk of getting influenza also known as the flu. The vaccine only helps protect you against some strains of the flu. This medicine may be used for other purposes; ask your health care provider or pharmacist if you have questions. COMMON BRAND NAME(S): Afluria, Afluria Quadrivalent, Agriflu, Alfuria, FLUAD, Fluarix, Fluarix Quadrivalent, Flublok, Flublok Quadrivalent, FLUCELVAX, FLUCELVAX Quadrivalent, Flulaval, Flulaval Quadrivalent, Fluvirin, Fluzone, Fluzone High-Dose, Fluzone Intradermal, Fluzone Quadrivalent What should I tell my health care provider before I take this medicine? They need to know if you have any of these conditions:  bleeding disorder like hemophilia  fever or  infection  Guillain-Barre syndrome or other neurological problems  immune system problems  infection with the human immunodeficiency virus (HIV) or AIDS  low blood platelet counts  multiple sclerosis  an unusual or allergic reaction to influenza virus vaccine, latex, other medicines, foods, dyes, or preservatives. Different brands of vaccines contain different allergens. Some may contain latex or eggs. Talk to your doctor about your allergies to make sure that you get the right vaccine.  pregnant or trying to get pregnant  breast-feeding How should I use this medicine? This vaccine is for injection into a muscle or under the skin. It is given by a health care professional. A copy of Vaccine Information Statements will be given before each vaccination. Read this sheet carefully each time. The sheet may change frequently. Talk to your healthcare provider to see which vaccines are right for you. Some vaccines should not be used in all age groups. Overdosage: If you think you have taken too much of this medicine contact a poison control center or emergency room at once. NOTE: This medicine is only for you. Do not share this medicine with others. What if I miss a dose? This does not apply. What may interact with this medicine?  chemotherapy or radiation therapy  medicines that lower your immune system like etanercept, anakinra, infliximab, and adalimumab  medicines that treat or prevent blood clots like warfarin  phenytoin  steroid medicines like prednisone or cortisone  theophylline  vaccines This list may not describe all possible interactions. Give your health care provider a list of all the medicines, herbs, non-prescription drugs, or dietary supplements you use. Also tell them if you smoke, drink alcohol, or use illegal drugs. Some items may interact with  your medicine. What should I watch for while using this medicine? Report any side effects that do not go away within 3  days to your doctor or health care professional. Call your health care provider if any unusual symptoms occur within 6 weeks of receiving this vaccine. You may still catch the flu, but the illness is not usually as bad. You cannot get the flu from the vaccine. The vaccine will not protect against colds or other illnesses that may cause fever. The vaccine is needed every year. What side effects may I notice from receiving this medicine? Side effects that you should report to your doctor or health care professional as soon as possible:  allergic reactions like skin rash, itching or hives, swelling of the face, lips, or tongue Side effects that usually do not require medical attention (report to your doctor or health care professional if they continue or are bothersome):  fever  headache  muscle aches and pains  pain, tenderness, redness, or swelling at the injection site  tiredness This list may not describe all possible side effects. Call your doctor for medical advice about side effects. You may report side effects to FDA at 1-800-FDA-1088. Where should I keep my medicine? The vaccine will be given by a health care professional in a clinic, pharmacy, doctor's office, or other health care setting. You will not be given vaccine doses to store at home. NOTE: This sheet is a summary. It may not cover all possible information. If you have questions about this medicine, talk to your doctor, pharmacist, or health care provider.  2020 Elsevier/Gold Standard (2018-03-14 08:45:43)

## 2020-03-13 NOTE — Progress Notes (Signed)
  [facility] OFFICE PROGRESS NOTE   Diagnosis: Non-Hodgkin's lymphoma  INTERVAL HISTORY:   [name] [name] returns as scheduled.  She completed another treatment with rituximab on 01/17/2020.  She feels well.  No interim illnesses or infections.  No fevers.  She has occasional mild sweats at nighttime.  She has not noticed any enlarged lymph nodes.  She has a good appetite.  She denies pain.  Headaches are better.  Objective:  Vital signs in last 24 hours:  Blood pressure 130/65, pulse 72, temperature (!) 97 F (36.1 C), temperature source Tympanic, resp. rate 16, height 5\' 2"  (1.575 m), weight 135 lb 3.2 oz (61.3 kg), SpO2 100 %.    HEENT: No thrush or ulcers. Lymphatics: No palpable cervical, supraclavicular, axillary or inguinal lymph nodes. Resp: Lungs clear bilaterally. Cardio: Regular rate and rhythm. GI: Abdomen soft and nontender.  No hepatosplenomegaly. Vascular: No leg edema.  Lab Results:  Lab Results  Component Value Date   WBC 8.1 03/13/2020   HGB 13.5 03/13/2020   HCT 40.4 03/13/2020   MCV 90.6 03/13/2020   PLT 263 03/13/2020   NEUTROABS 5.4 03/13/2020    Imaging:  No results found.  Medications: I have reviewed the patient's current medications.  Assessment/Plan: 1. Non-Hodgkin's lymphoma-follicular lymphoma  Mesenteric lymph node biopsy 07/15/1999-follicular center cell lymphoma, grade 2  Treated with mitoxantrone, fludarabine, and rituximab completed in January 2002  Progressive disease in February 2004, treated with single agent rituximab February 2004 through August 2005  CTs 06/21/2014-no evidence of lymphoma  CT abdomen/pelvis 06/05/2018-enlarging left mid abdominal mesenteric mass  CT biopsy of the mesenteric mass 06/16/2018-follicular lymphoma, low-grade, CD20 positive  Cycle 1 weekly Rituxan 07/24/2018  Cycle 2 weekly Rituxan 07/31/2018  Cycle 3 weekly Rituxan 08/07/2018  Cycle 4 weekly Rituxan 08/14/2018  CT  abdomen/pelvis 10/09/2018-reduction in size of left lower quadrant mesenteric mass  Cycle 1 maintenance rituximab 10/19/2018  Cycle 2 maintenance Rituxan 12/14/2018  Cycle 3 maintenance Rituxan 02/08/2019  Cycle 4 maintenance Rituxan 04/09/2019  CT abdomen/pelvis 05/31/2019-no change in jejunal mesenteric mass, no evidence of disease progression.  Cycle 5 maintenance rituximab 06/07/2019  Cycle 6 maintenance rituximab 08/02/2019  Cycle 7 maintenance rituximab 09/27/2019  CT abdomen/pelvis 11/20/2019-further contraction of the lesion at the small bowel mesentery, no evidence of progressive lymphoma  Cycle 8 maintenance rituximab 11/22/2019  Cycle 9 maintenance rituximab 01/17/2020 2. Irritable bowel syndrome 3. History of anal incontinence 4. Osteopenia 5. History of colon polyps 6. Weakness, flushing, presyncope and chills during Rituxan infusion 07/24/2018. 7. Mild hypercalcemia-chronic    Disposition: [name] [name] appears stable.  There is no clinical evidence for progression of lymphoma.  She will complete another cycle of maintenance rituximab today.  We reviewed the CBC, chemistry panel and LDH from today.  Labs adequate to proceed with treatment.  She will return for lab, follow-up, rituximab in 8 weeks.  We are available to see her sooner if needed.    Ned Card ANP/GNP-BC   03/13/2020  10:22 AM

## 2020-03-14 ENCOUNTER — Telehealth: Payer: Self-pay | Admitting: Nurse Practitioner

## 2020-03-14 NOTE — Telephone Encounter (Signed)
Scheduled appointments per 11/11 los. Mailed updated calendar to patient with appointments date and times.

## 2020-03-17 ENCOUNTER — Other Ambulatory Visit: Payer: Self-pay | Admitting: Internal Medicine

## 2020-03-17 DIAGNOSIS — Z23 Encounter for immunization: Secondary | ICD-10-CM | POA: Diagnosis not present

## 2020-03-19 IMAGING — CT CT BIOPSY
1 of 4 series · 10 of 32 positions shown, 16 images · non-contrast
Comparison: CT abdomen pelvis - 06/05/2018;

INDICATION: History of non-Hodgkin's lymphoma, now with mesenteric mass
worrisome for recurrence

EXAM:
CT-GUIDED BIOPSY OF ENLARGING MESENTERIC MASS

[Series 2: i-spiral 5.0 b40f · axial · 0.94mm/px · z∈[+923,+1045]mm · 10 of 43 slices shown, 16 images]
[im 4/43  soft-tissue]
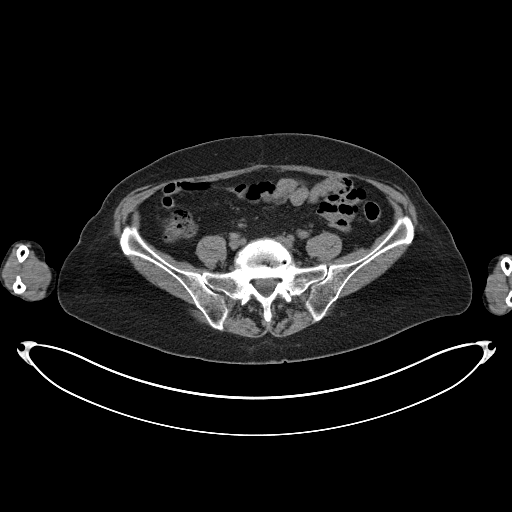
[im 4/43  bone]
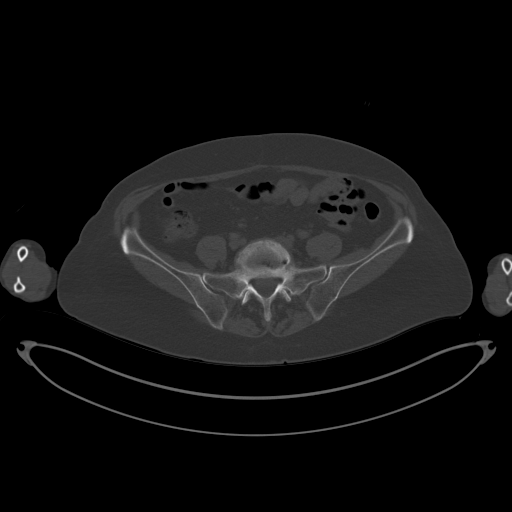
[im 8/43  soft-tissue]
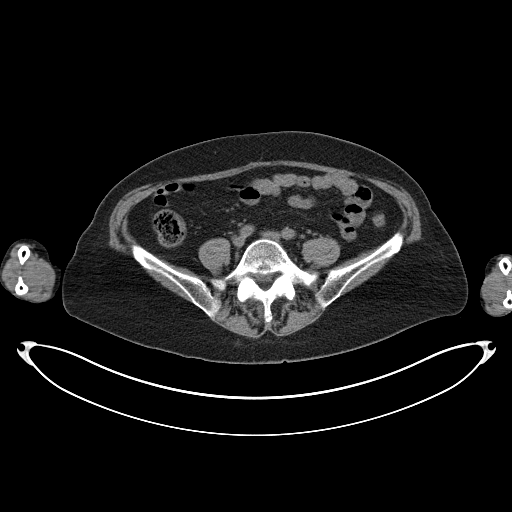
[im 12/43  soft-tissue]
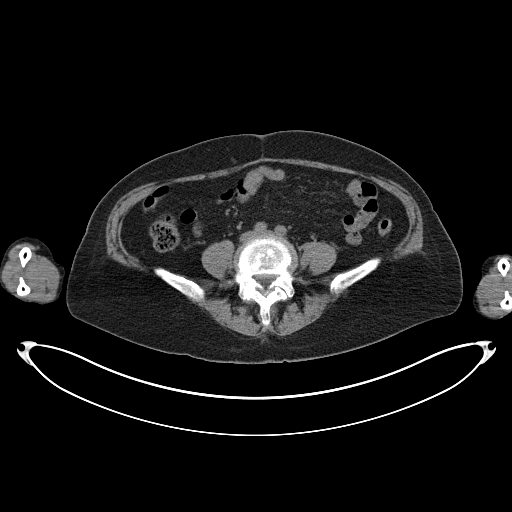
[im 16/43  soft-tissue]
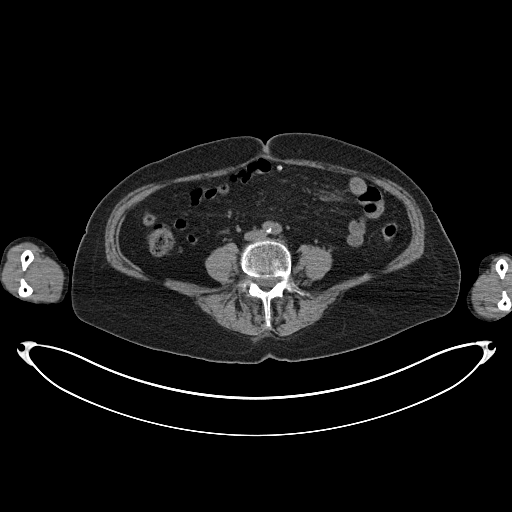
[im 20/43  soft-tissue]
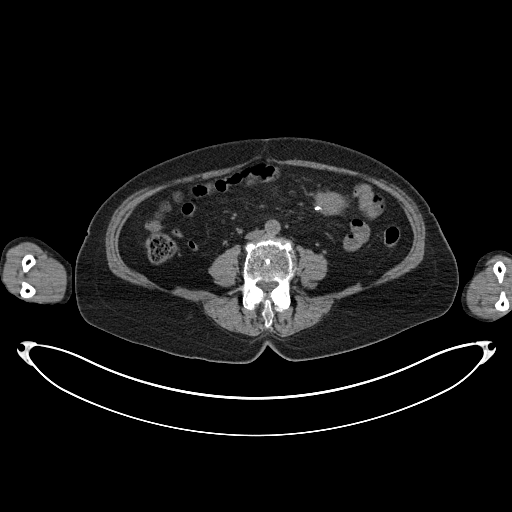
[im 23/43  soft-tissue]
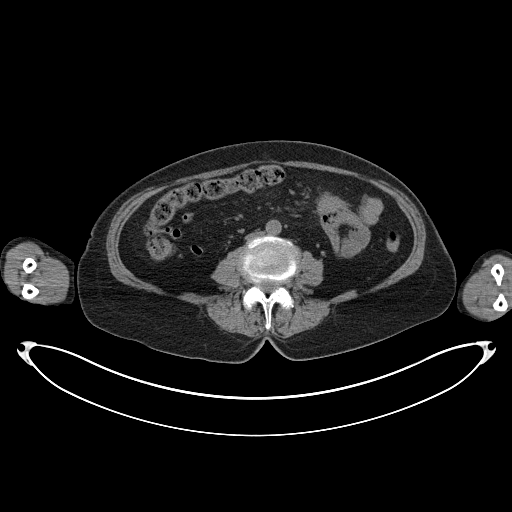
[im 27/43  soft-tissue]
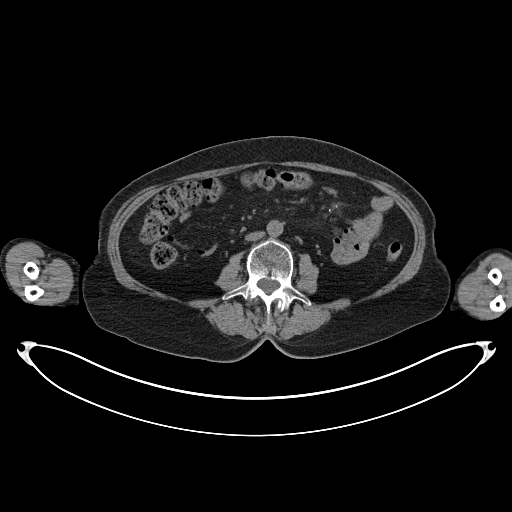
[im 27/43  lung]
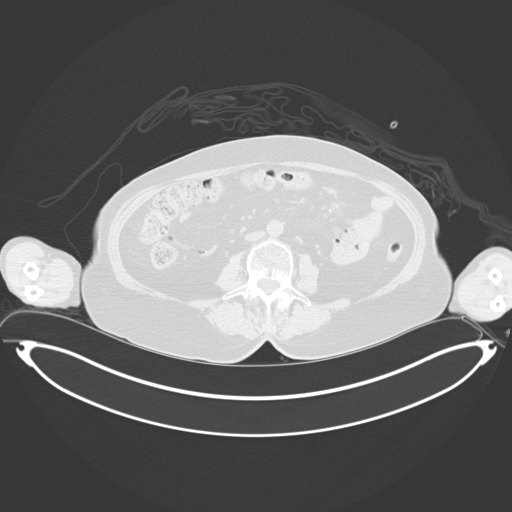
[im 31/43  soft-tissue]
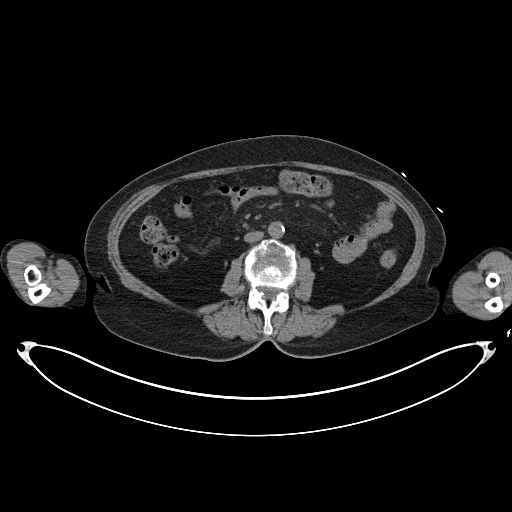
[im 31/43  lung]
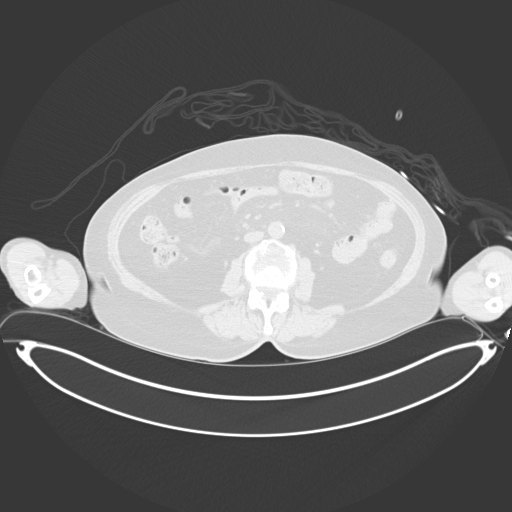
[im 35/43  soft-tissue]
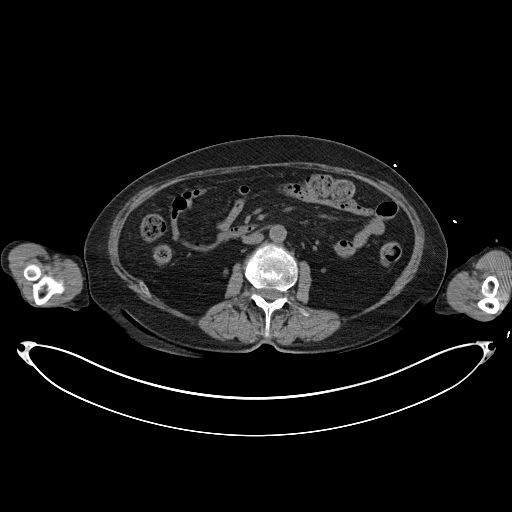
[im 35/43  lung]
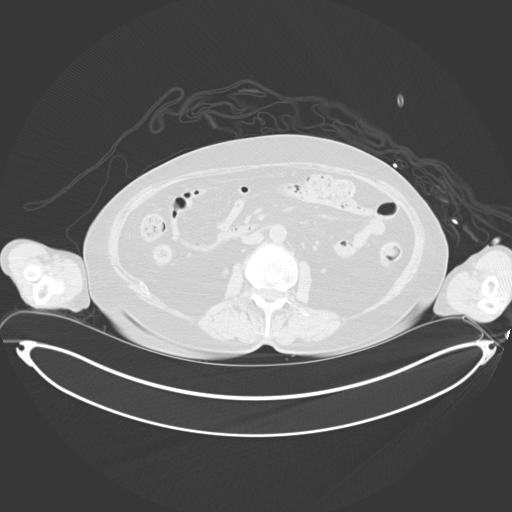
[im 35/43  bone]
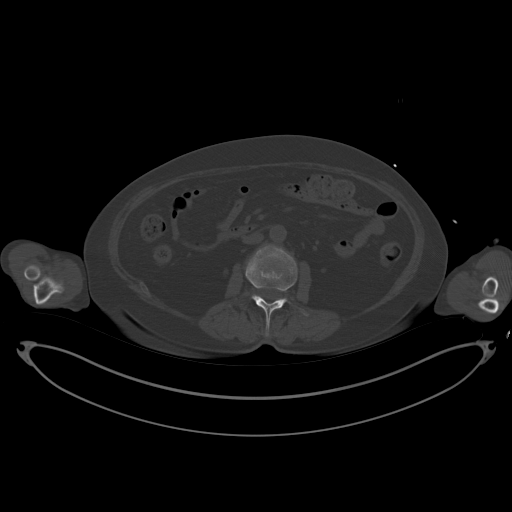
[im 39/43  soft-tissue]
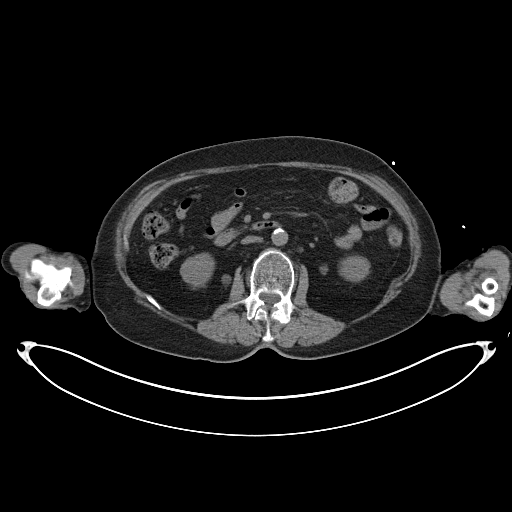
[im 39/43  lung]
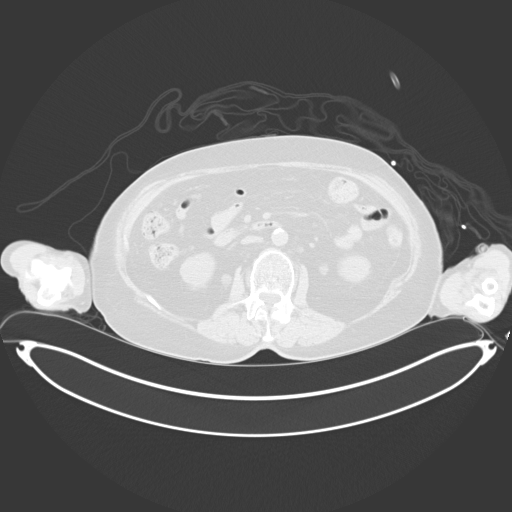

[10 of 32 positions shown; findings below may reference images not displayed]

09/29/2016; PET-CT -
[DATE]

MEDICATIONS:
None.

ANESTHESIA/SEDATION:
Fentanyl 50 mcg IV; Versed 1 mg IV

Sedation time: 14 minutes; The patient was continuously monitored
during the procedure by the interventional radiology nurse under my
direct supervision.

CONTRAST:  None.

COMPLICATIONS:
None immediate.

PROCEDURE:
Informed consent was obtained from the patient following an
explanation of the procedure, risks, benefits and alternatives. A
time out was performed prior to the initiation of the procedure.

The patient was positioned supine on the CT table and a limited CT
was performed for procedural planning demonstrating unchanged size
and appearance of the approximately 2.8 x 1.9 cm mass within the
left mid aspect of the abdominal mesentery (image 23, series 2). The
procedure was planned. The operative site was prepped and draped in
the usual sterile fashion. Appropriate trajectory was confirmed with
a 22 gauge spinal needle after the adjacent tissues were
anesthetized with 1% Lidocaine with epinephrine.

Under intermittent CT guidance, a 17 gauge coaxial needle was
advanced into the peripheral aspect of the mass.

Appropriate positioning was confirmed and 5 core needle biopsy
samples were obtained with an 18 gauge core needle biopsy device.
The co-axial needle was removed following the administration of a
Gel-Foam slurry and hemostasis was achieved with manual compression.

A limited postprocedural CT was negative for hemorrhage or
additional complication. A dressing was placed. The patient
tolerated the procedure well without immediate postprocedural
complication.
IMPRESSION: Technically successful CT guided core needle biopsy of indeterminate
mesenteric mass.

## 2020-04-15 ENCOUNTER — Ambulatory Visit: Payer: Medicare Other

## 2020-04-15 NOTE — Progress Notes (Signed)
NEUROLOGY CONSULTATION NOTE  RMONI KEPLINGER MRN: [id number] DOB: [birth year]  Referring provider: Elvia Collum, DO Primary care provider: Elvia Collum, DO  Reason for consult:  headache   Subjective:  [name] [name] is a 79 year old right-handed female with HTN, MVP and HLD who presents for headache.  History supplemented by referring provider's note.  In late August, she was getting ready one morning for church and she saw a central dark scotoma and only saw half the image.  This lasted 10 to 15 minutes.  She then developed a dull non-throbbing holocephalic headache.  Photophobia and phonophobia.  No nausea or vomiting.  As day progressed, it improved.  The next day, she just didn't feel "a hundred percent".  About 3 weeks ago, she then started experiencing fractured vision/sensation looking through water lasting 5-6 minutes but did not progress to headache.   CT head without contrast on 01/18/2020 personally reviewed was unremarkable.  However, she also reports fleeting pain over left eye, sharp pain, 1 to several times a day, daily.  This has been occurring since 2020.  No associated symptoms such as visual disturbance, nausea, autonomic symptoms, vomiting, photophobia, phonophobia, numbness and weakness.  Sed rate 2.    She reports no significant history of migraines but may have had some headaches decades ago.  Current NSAIDS/analgesics:  none Current triptans:  none Current ergotamine:  none Current anti-emetic:  none Current muscle relaxants:  none Current Antihypertensive medications:  Atenolol 25mg  in AM and 12.5mg  in PM Current Antidepressant medications:  none Current Anticonvulsant medications:  none Current anti-CGRP:  none Current Vitamins/Herbal/Supplements:  none Current Antihistamines/Decongestants:  Allegra Other therapy:  none Hormone/birth control:  none  Past NSAIDS/analgesics:  none Past abortive triptans:  none Past abortive ergotamine:  none Past  muscle relaxants:  none Past anti-emetic:  none Past antihypertensive medications:  none Past antidepressant medications:  none Past anticonvulsant medications:  none Past anti-CGRP:  none Past vitamins/Herbal/Supplements: none Other past therapies:  none      PAST MEDICAL HISTORY: 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   Manometry 12/2015   Arthritis    Brady-tachy syndrome (HCC)    Chest pain 10/16/13   normal coronary arteries on cath and normal EF   Colitis, ischemic (HCC)    Colon polyp 07/29/1993   with focal adenomatous changes   Diverticulitis    Diverticulosis of colon    Dyspepsia    Dyssynergic defecation 12/31/2015   Anorectal mano 12/2015, also has rectal hypersensitivity   Fatty liver    GERD (gastroesophageal reflux disease)    Heart murmur    HLD (hyperlipidemia)    Hypertension    IBS (irritable bowel syndrome)    Incontinence, feces    MVP (mitral valve prolapse)    nhl dx'd 07/1999/ 06/2018   chemo comp 2001; rituxin comp 2005   Osteopenia    Palpitation    Rectocele    Rectocele    Vasovagal syncope     PAST SURGICAL HISTORY: Past Surgical History:  Procedure Laterality Date   ABDOMINAL HYSTERECTOMY     ABDOMINAL SURGERY  2001    for non hodgkins lymphoma; lymphoma in [place] N/A 12/24/2015   Procedure: ANO RECTAL MANOMETRY;  Surgeon: Gatha Mayer, MD;  Location: [facility];  Service: Endoscopy;  Laterality: N/A;   BLADDER SUSPENSION     BUNIONECTOMY     CHOLECYSTECTOMY  2003   COLONOSCOPY  7/200/, 11/2007   diverticulosis   FINE NEEDLE ASPIRATION BIOPSY Left 06/16/2018   left mesentery tissue   LEFT HEART CATHETERIZATION WITH CORONARY ANGIOGRAM N/A 10/16/2013   Procedure: LEFT HEART CATHETERIZATION WITH CORONARY ANGIOGRAM;  Surgeon: Peter M Martinique, MD;  Location: [facility] CATH LAB;  Service: Cardiovascular;  Laterality: N/A;   pelvic prolapse  repair     UPPER GASTROINTESTINAL ENDOSCOPY  10/2005   hiatal hernia    MEDICATIONS: Current Outpatient Medications on File Prior to Visit  Medication Sig Dispense Refill   ALPRAZolam (XANAX) 0.25 MG tablet Take 1 tablet (0.25 mg total) by mouth daily as needed for anxiety. 30 tablet 2   atenolol (TENORMIN) 25 MG tablet Take 25 mg in the AM and 12.5 mg in the PM 135 tablet 11   atorvastatin (LIPITOR) 10 MG tablet Take 1 tablet (10 mg total) by mouth daily. 90 tablet 3   fexofenadine (ALLEGRA) 180 MG tablet Take 180 mg by mouth daily as needed for allergies.      pantoprazole (PROTONIX) 40 MG tablet TAKE 1 TABLET BY MOUTH ONCE DAILY BEFORE BREAKFAST 90 tablet 0   Polyvinyl Alcohol-Povidone (REFRESH OP) Apply 1 drop to eye 3 (three) times daily.     Probiotic Product (ALIGN PO) Take 1 capsule by mouth daily. Alternates between [place] per PCP orders every few months      TURMERIC PO Take 1 capsule by mouth daily.      UNABLE TO FIND Apply topically.     Wheat Dextrin (BENEFIBER) POWD Take 1 Dose by mouth daily.     No current facility-administered medications on file prior to visit.    ALLERGIES: Allergies  Allergen Reactions   Other Anaphylaxis    Other reaction(s): Facial swelling And face swelling And face swelling    Sulfa Antibiotics Swelling and Anaphylaxis    And face swelling Face swelling    Sulfonamide Derivatives Swelling    Face swelling    Glycopyrrolate Other (See Comments)    Unable to urinate Could not urinate after it was given   Latex Other (See Comments)    Blisters   Methscopolamine Other (See Comments)    Unable to urinate Unable to urinate   Ace Inhibitors Hives   Codeine Nausea Only   Iodine Hives   Iohexol Hives     Desc: 50 mg benadryl prior to exam     FAMILY HISTORY: Family History  Problem Relation Age of Onset   Colon cancer Father 20   Prostate cancer Brother    Colon polyps Brother     Colon polyps Brother    Stomach cancer Maternal Uncle    Stomach cancer Maternal Grandfather    Esophageal cancer Neg Hx    Pancreatic cancer Neg Hx    Liver disease Neg Hx    Breast cancer Neg Hx     SOCIAL HISTORY: Social History   Socioeconomic History   Marital status: Widowed    Spouse name: Not on file   Number of children: 1   Years of education: Not on file   Highest education level: Not on file  Occupational History   Occupation: Medical sales representative work part time    Employer: RETIRED  Tobacco Use   Smoking status: Former Smoker    Packs/day: 0.33   Smokeless tobacco: Never Used   Tobacco comment: quit 1976  Vaping Use   Vaping Use: Never used  Substance and Sexual Activity   Alcohol use: Yes  Alcohol/week: 0.0 standard drinks    Comment: very rare   Drug use: No   Sexual activity: Yes    Birth control/protection: Surgical  Other Topics Concern   Not on file  Social History Narrative   Retired, widowed, 1 child   Rare EtOH, former smoker none now   No drugs   Social Determinants of Radio broadcast assistant Strain: Not on file  Food Insecurity: Not on file  Transportation Needs: Not on file  Physical Activity: Not on file  Stress: Not on file  Social Connections: Not on file  Intimate Partner Violence: Not on file    Objective:  Blood pressure 132/75, pulse 66, height 5\' 2"  (1.575 m), weight 133 lb 3.2 oz (60.4 kg), SpO2 99 %. General: No acute distress.  Patient appears well-groomed.   Head:  Normocephalic/atraumatic Eyes:  fundi examined but not visualized Neck: supple, no paraspinal tenderness, full range of motion Back: No paraspinal tenderness Heart: regular rate and rhythm Lungs: Clear to auscultation bilaterally. Vascular: No carotid bruits. Neurological Exam: Mental status: alert and oriented to person, place, and time, recent and remote memory intact, fund of knowledge intact, attention and concentration intact, speech  fluent and not dysarthric, language intact. Cranial nerves: CN I: not tested CN II: pupils equal, round and reactive to light, visual fields intact CN III, IV, VI:  full range of motion, no nystagmus, no ptosis CN V: facial sensation intact. CN VII: upper and lower face symmetric CN VIII: hearing intact CN IX, X: gag intact, uvula midline CN XI: sternocleidomastoid and trapezius muscles intact CN XII: tongue midline Bulk & Tone: normal, no fasciculations. Motor:  muscle strength 5/5 throughout Sensation:  Pinprick, temperature and vibratory sensation intact. Deep Tendon Reflexes:  2+ throughout,  toes downgoing.   Finger to nose testing:  Without dysmetria.   Heel to shin:  Without dysmetria.   Gait:  Normal station and stride.  Romberg negative.  Assessment/Plan:   1.  Probable migraine with aura, without status migrainosus, not intractable 2.  Paroxysmal unilateral headache.  May be primary stabbing headache vs V1 trigeminal neuralgia However, given no prior history of headaches, will evaluate for secondary intracranial abnormality  1.  MRI/MRA brain 2.  Further recommendations pending results.    Thank you for allowing me to take part in the care of this patient.  Metta Clines, DO  CC: Elvia Collum, DO

## 2020-04-16 ENCOUNTER — Other Ambulatory Visit: Payer: Self-pay

## 2020-04-16 ENCOUNTER — Ambulatory Visit (INDEPENDENT_AMBULATORY_CARE_PROVIDER_SITE_OTHER): Payer: Medicare Other | Admitting: Neurology

## 2020-04-16 ENCOUNTER — Encounter: Payer: Self-pay | Admitting: Neurology

## 2020-04-16 VITALS — BP 132/75 | HR 66 | Ht 62.0 in | Wt 133.2 lb

## 2020-04-16 DIAGNOSIS — R519 Headache, unspecified: Secondary | ICD-10-CM

## 2020-04-16 DIAGNOSIS — G43109 Migraine with aura, not intractable, without status migrainosus: Secondary | ICD-10-CM | POA: Diagnosis not present

## 2020-04-16 NOTE — Patient Instructions (Addendum)
I agree that you had migraines The brief sharp headache may be a primary stabbing headache or a nerve pain in the head.  However, to be sure, we will order MRI/MRA of brain. We have sent a referral to [facility] for your MRI and they will call you directly to schedule your appointment. They are located at [place]. If you need to contact them directly please call [phone]. Further recommendations pending results.

## 2020-04-23 DIAGNOSIS — G245 Blepharospasm: Secondary | ICD-10-CM | POA: Diagnosis not present

## 2020-05-04 ENCOUNTER — Other Ambulatory Visit: Payer: Self-pay | Admitting: Oncology

## 2020-05-08 ENCOUNTER — Inpatient Hospital Stay (HOSPITAL_BASED_OUTPATIENT_CLINIC_OR_DEPARTMENT_OTHER): Payer: Medicare Other | Admitting: Oncology

## 2020-05-08 ENCOUNTER — Inpatient Hospital Stay: Payer: Medicare Other | Attending: Oncology

## 2020-05-08 ENCOUNTER — Inpatient Hospital Stay: Payer: Medicare Other

## 2020-05-08 ENCOUNTER — Other Ambulatory Visit: Payer: Self-pay

## 2020-05-08 VITALS — BP 151/72 | HR 78 | Temp 98.7°F | Resp 18 | Ht 62.0 in | Wt 132.6 lb

## 2020-05-08 VITALS — BP 109/63 | HR 70 | Temp 98.7°F | Resp 18

## 2020-05-08 DIAGNOSIS — K589 Irritable bowel syndrome without diarrhea: Secondary | ICD-10-CM | POA: Diagnosis not present

## 2020-05-08 DIAGNOSIS — C8203 Follicular lymphoma grade I, intra-abdominal lymph nodes: Secondary | ICD-10-CM | POA: Insufficient documentation

## 2020-05-08 DIAGNOSIS — M8580 Other specified disorders of bone density and structure, unspecified site: Secondary | ICD-10-CM | POA: Diagnosis not present

## 2020-05-08 DIAGNOSIS — Z5112 Encounter for antineoplastic immunotherapy: Secondary | ICD-10-CM | POA: Insufficient documentation

## 2020-05-08 LAB — CBC WITH DIFFERENTIAL (CANCER CENTER ONLY)
Abs Immature Granulocytes: 0.02 10*3/uL (ref 0.00–0.07)
Basophils Absolute: 0.1 10*3/uL (ref 0.0–0.1)
Basophils Relative: 1 %
Eosinophils Absolute: 0.1 10*3/uL (ref 0.0–0.5)
Eosinophils Relative: 1 %
HCT: 39.8 % (ref 36.0–46.0)
Hemoglobin: 13.2 g/dL (ref 12.0–15.0)
Immature Granulocytes: 0 %
Lymphocytes Relative: 15 %
Lymphs Abs: 1.3 10*3/uL (ref 0.7–4.0)
MCH: 29.9 pg (ref 26.0–34.0)
MCHC: 33.2 g/dL (ref 30.0–36.0)
MCV: 90 fL (ref 80.0–100.0)
Monocytes Absolute: 1 10*3/uL (ref 0.1–1.0)
Monocytes Relative: 11 %
Neutro Abs: 6.3 10*3/uL (ref 1.7–7.7)
Neutrophils Relative %: 72 %
Platelet Count: 238 10*3/uL (ref 150–400)
RBC: 4.42 MIL/uL (ref 3.87–5.11)
RDW: 12.8 % (ref 11.5–15.5)
WBC Count: 8.8 10*3/uL (ref 4.0–10.5)
nRBC: 0 % (ref 0.0–0.2)

## 2020-05-08 LAB — CMP (CANCER CENTER ONLY)
ALT: 20 U/L (ref 0–44)
AST: 21 U/L (ref 15–41)
Albumin: 3.9 g/dL (ref 3.5–5.0)
Alkaline Phosphatase: 71 U/L (ref 38–126)
Anion gap: 9 (ref 5–15)
BUN: 14 mg/dL (ref 8–23)
CO2: 25 mmol/L (ref 22–32)
Calcium: 10.6 mg/dL — ABNORMAL HIGH (ref 8.9–10.3)
Chloride: 109 mmol/L (ref 98–111)
Creatinine: 0.77 mg/dL (ref 0.44–1.00)
GFR, Estimated: >60 mL/min (ref >60)
Glucose, Bld: 92 mg/dL (ref 70–99)
Potassium: 4.1 mmol/L (ref 3.5–5.1)
Sodium: 143 mmol/L (ref 135–145)
Total Bilirubin: 0.4 mg/dL (ref 0.3–1.2)
Total Protein: 6.4 g/dL — ABNORMAL LOW (ref 6.5–8.1)

## 2020-05-08 LAB — LACTATE DEHYDROGENASE: LDH: 223 U/L — ABNORMAL HIGH (ref 98–192)

## 2020-05-08 MED ORDER — SODIUM CHLORIDE 0.9 % IV SOLN
Freq: Once | INTRAVENOUS | Status: AC
  Filled 2020-05-08: qty 250

## 2020-05-08 MED ORDER — ACETAMINOPHEN 325 MG PO TABS
ORAL_TABLET | ORAL | Status: AC
  Filled 2020-05-08: qty 2

## 2020-05-08 MED ORDER — DIPHENHYDRAMINE HCL 25 MG PO CAPS
50.0000 mg | ORAL_CAPSULE | Freq: Once | ORAL | Status: AC
  Administered 2020-05-08: 50 mg via ORAL

## 2020-05-08 MED ORDER — SODIUM CHLORIDE 0.9 % IV SOLN
375.0000 mg/m2 | Freq: Once | INTRAVENOUS | Status: AC
  Administered 2020-05-08: 600 mg via INTRAVENOUS
  Filled 2020-05-08: qty 10

## 2020-05-08 MED ORDER — ACETAMINOPHEN 325 MG PO TABS
650.0000 mg | ORAL_TABLET | Freq: Once | ORAL | Status: AC
  Administered 2020-05-08: 650 mg via ORAL

## 2020-05-08 NOTE — Patient Instructions (Signed)
[facility] Discharge Instructions for Patients Receiving Chemotherapy  Today you received the following chemotherapy agents Rituximab (Rituxan).  To help prevent nausea and vomiting after your treatment, we encourage you to take your nausea medication as prescribed.   If you develop nausea and vomiting that is not controlled by your nausea medication, call the clinic.   BELOW ARE SYMPTOMS THAT SHOULD BE REPORTED IMMEDIATELY:  *FEVER GREATER THAN 100.5 F  *CHILLS WITH OR WITHOUT FEVER  NAUSEA AND VOMITING THAT IS NOT CONTROLLED WITH YOUR NAUSEA MEDICATION  *UNUSUAL SHORTNESS OF BREATH  *UNUSUAL BRUISING OR BLEEDING  TENDERNESS IN MOUTH AND THROAT WITH OR WITHOUT PRESENCE OF ULCERS  *URINARY PROBLEMS  *BOWEL PROBLEMS  UNUSUAL RASH Items with * indicate a potential emergency and should be followed up as soon as possible.  Feel free to call the clinic should you have any questions or concerns. The clinic phone number is [phone].  Please show the CHEMO ALERT CARD at check-in to the Emergency Department and triage nurse.   

## 2020-05-08 NOTE — Progress Notes (Signed)
  [facility] OFFICE PROGRESS NOTE   Diagnosis: Non-Hodgkin's lymphoma  INTERVAL HISTORY:   [name] returns as scheduled.  She completed another treatment with rituximab on 03/13/2020.  No symptom of allergic reaction.  She feels well.  No fever or drenching night sweats.  She is exercising.  No palpable lymph nodes.  Objective:  Vital signs in last 24 hours:  Blood pressure (!) 151/72, pulse 78, temperature 98.7 F (37.1 C), temperature source Tympanic, resp. rate 18, height 5\' 2"  (1.575 m), weight 132 lb 9.6 oz (60.1 kg), SpO2 99 %.    Lymphatics: No cervical, supraclavicular, axillary, or inguinal nodes Resp: Lungs clear bilaterally Cardio: Regular rate and rhythm GI: No mass, no hepatosplenomegaly, mild tenderness in the left upper abdomen Vascular: No leg edema   Lab Results:  Lab Results  Component Value Date   WBC 8.8 05/08/2020   HGB 13.2 05/08/2020   HCT 39.8 05/08/2020   MCV 90.0 05/08/2020   PLT 238 05/08/2020   NEUTROABS 6.3 05/08/2020    CMP  Lab Results  Component Value Date   NA 142 03/13/2020   K 4.1 03/13/2020   CL 107 03/13/2020   CO2 28 03/13/2020   GLUCOSE 83 03/13/2020   BUN 17 03/13/2020   CREATININE 0.78 03/13/2020   CALCIUM 10.1 03/13/2020   PROT 6.5 03/13/2020   ALBUMIN 3.9 03/13/2020   AST 33 03/13/2020   ALT 36 03/13/2020   ALKPHOS 70 03/13/2020   BILITOT 0.5 03/13/2020   GFRNONAA >60 03/13/2020   GFRAA >60 01/17/2020     Medications: I have reviewed the patient's current medications.   Assessment/Plan:  1. Non-Hodgkin's lymphoma-follicular lymphoma  Mesenteric lymph node biopsy 07/15/1999-follicular center cell lymphoma, grade 2  Treated with mitoxantrone, fludarabine, and rituximab completed in January 2002  Progressive disease in February 2004, treated with single agent rituximab February 2004 through August 2005  CTs 06/21/2014-no evidence of lymphoma  CT abdomen/pelvis 06/05/2018-enlarging left  mid abdominal mesenteric mass  CT biopsy of the mesenteric mass 06/16/2018-follicular lymphoma, low-grade, CD20 positive  Cycle 1 weekly Rituxan 07/24/2018  Cycle 2 weekly Rituxan 07/31/2018  Cycle 3 weekly Rituxan 08/07/2018  Cycle 4 weekly Rituxan 08/14/2018  CT abdomen/pelvis 10/09/2018-reduction in size of left lower quadrant mesenteric mass  Cycle 1 maintenance rituximab 10/19/2018  Cycle 2 maintenance Rituxan 12/14/2018  Cycle 3 maintenance Rituxan 02/08/2019  Cycle 4 maintenance Rituxan 04/09/2019  CT abdomen/pelvis 05/31/2019-no change in jejunal mesenteric mass, no evidence of disease progression.  Cycle 5 maintenance rituximab 06/07/2019  Cycle 6 maintenance rituximab 08/02/2019  Cycle 7 maintenance rituximab 09/27/2019  CT abdomen/pelvis 11/20/2019-further contraction of the lesion at the small bowel mesentery, no evidence of progressive lymphoma  Cycle 8 maintenance rituximab 11/22/2019  Cycle 9 maintenance rituximab 01/17/2020  Cycle 10 maintenance rituximab 03/13/2020  Cycle 11 maintenance rituximab 05/08/2020 2. Irritable bowel syndrome 3. History of anal incontinence 4. Osteopenia 5. History of colon polyps 6. Weakness, flushing, presyncope and chills during Rituxan infusion 07/24/2018. 7. Mild hypercalcemia-chronic    Disposition: [name] [name] appears stable.  She is tolerating the rituximab well.  There is no clinical evidence of progressive lymphoma.  She will complete another treatment with rituximab today.  She will return for an office visit in the next cycle of rituximab in 8 weeks.  The plan is to complete a final cycle of maintenance rituximab in late April or early May.  June, MD  05/08/2020  11:26 AM

## 2020-05-09 ENCOUNTER — Telehealth: Payer: Self-pay | Admitting: Oncology

## 2020-05-09 NOTE — Telephone Encounter (Signed)
Scheduled appointments per 1/6 los; Spoke to patient who is aware of appointments dates and times.  

## 2020-05-14 ENCOUNTER — Ambulatory Visit
Admission: RE | Admit: 2020-05-14 | Discharge: 2020-05-14 | Disposition: A | Discharge status: Home / Self Care | Payer: Medicare Other | Source: Ambulatory Visit | Attending: Neurology | Admitting: Neurology

## 2020-05-14 ENCOUNTER — Other Ambulatory Visit: Payer: Self-pay | Admitting: Cardiology

## 2020-05-14 ENCOUNTER — Other Ambulatory Visit: Payer: Self-pay

## 2020-05-14 DIAGNOSIS — R519 Headache, unspecified: Secondary | ICD-10-CM

## 2020-05-14 DIAGNOSIS — G43109 Migraine with aura, not intractable, without status migrainosus: Secondary | ICD-10-CM

## 2020-05-14 DIAGNOSIS — R29818 Other symptoms and signs involving the nervous system: Secondary | ICD-10-CM | POA: Diagnosis not present

## 2020-05-15 NOTE — Progress Notes (Signed)
CARDIOLOGY OFFICE NOTE  Date:  05/29/2020    [name] [name] Date of Birth: [birth year] Medical Record [id number]  PCP:  Erven Colla, DO  Cardiologist:  Hazle Quant    Chief Complaint  Patient presents with  . Follow-up    Seen for Dr. Meda Coffee    History of Present Illness: [name] [name] is a 80 y.o. female who presents today for a follow up visit. Seen for Dr. Meda Coffee.    She has a history of MVP, bradycardia - tachycardia syndrome, palpitations, HLD, hypertension, normal coronary arteries and LV function on cath in 10/16/13.  She has had NSVT on prior Holter. Echo ok with normal EF and diastolic dysfunction. She has seen EP. She is managed on beta blocker - has not tolerated Metoprolol in the past.  Flecainide would be another medical option in conjunction with her beta blocker if she were to have bothersome symptoms.  Cardiac MRI has been normal. Other issues include non Hodgkin's lymphoma - found in 2001 - this has relapsed - on chronic Rituximab. Normal cath from 2015 noted.    Last seen by Dr. Meda Coffee in March of 2020 - some chronic palpitations. 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 Last visit with me back in July - doing well - walking 3 miles a day. Statin had been recommended by PCP and I started.  Continues with Rituxin every 8 weeks.    Comes in today. Here alone. Doing well. No chest pain. Breathing is good. Will have some occasional palpitations - can make her a little breathless at times - but still feels better than she has in the past. She is on 37.5 of Atenolol total. HR is fine. No chest pain. Breathing is ok. She also notes some generalized soreness - wonders if this is from her Lipitor. Not really achy but "sore". Still walking 3 miles a day. Has had 2 tips of her fingers turn white yesterday - probably some degree of Raynaud's. She is on Tumeric.   Past  Medical History:  Diagnosis Date  . Anal sphincter incompetence 12/31/2015   Manometry 12/2015  . Arthritis   . Brady-tachy syndrome ([place])   . Chest pain 10/16/13   normal coronary arteries on cath and normal EF  . Colitis, ischemic ([place])   . Colon polyp 07/29/1993   with focal adenomatous changes  . Diverticulitis   . Diverticulosis of colon   . Dyspepsia   . Dyssynergic defecation 12/31/2015   Anorectal mano 12/2015, also has rectal hypersensitivity  . Fatty liver   . GERD (gastroesophageal reflux disease)   . Heart murmur   . HLD (hyperlipidemia)   . Hypertension   . IBS (irritable bowel syndrome)   . Incontinence, feces   . MVP (mitral valve prolapse)   . nhl dx'd 07/1999/ 06/2018   chemo comp 2001; rituxin comp 2005  . Osteopenia   . Palpitation   . Rectocele   . Rectocele   . Vasovagal syncope     Past Surgical History:  Procedure Laterality Date  . ABDOMINAL HYSTERECTOMY    . ABDOMINAL SURGERY  2001    for non hodgkins lymphoma; lymphoma in mesentary  . ANAL RECTAL MANOMETRY N/A 12/24/2015   Procedure: ANO RECTAL MANOMETRY;  Surgeon: Gatha Mayer, MD;  Location: [facility];  Service: Endoscopy;  Laterality: N/A;  . BLADDER SUSPENSION    . BUNIONECTOMY    . CHOLECYSTECTOMY  2003  . COLONOSCOPY  7/200/, 11/2007   diverticulosis  . FINE NEEDLE ASPIRATION BIOPSY Left 06/16/2018   left mesentery tissue  . LEFT HEART CATHETERIZATION WITH CORONARY ANGIOGRAM N/A 10/16/2013   Procedure: LEFT HEART CATHETERIZATION WITH CORONARY ANGIOGRAM;  Surgeon: Peter M Martinique, MD;  Location: [facility] CATH LAB;  Service: Cardiovascular;  Laterality: N/A;  . pelvic prolapse repair    . UPPER GASTROINTESTINAL ENDOSCOPY  10/2005   hiatal hernia     Medications: Current Meds  Medication Sig  . ALPRAZolam (XANAX) 0.25 MG tablet Take 1 tablet (0.25 mg total) by mouth daily as needed for anxiety.  Marland Kitchen atenolol (TENORMIN) 25 MG tablet TAKE 1.5 TABLETS (37.5MG  TOTAL) BY MOUTHDAILY.  Marland Kitchen atorvastatin  (LIPITOR) 10 MG tablet Take 1 tablet (10 mg total) by mouth daily.  . fexofenadine (ALLEGRA) 180 MG tablet Take 180 mg by mouth daily as needed for allergies.  . pantoprazole (PROTONIX) 40 MG tablet TAKE 1 TABLET BY MOUTH ONCE DAILY BEFORE BREAKFAST  . Polyvinyl Alcohol-Povidone (REFRESH OP) Apply 1 drop to eye 3 (three) times daily.  . Probiotic Product (ALIGN PO) Take 1 capsule by mouth daily. Alternates between [place] per PCP orders every few months  . TURMERIC PO Take 1 capsule by mouth daily.   Marland Kitchen UNABLE TO FIND Apply topically.  . Wheat Dextrin (BENEFIBER) POWD Take 1 Dose by mouth daily.     Allergies: Allergies  Allergen Reactions  . Other Anaphylaxis    Other reaction(s): Facial swelling And face swelling And face swelling   . Sulfa Antibiotics Swelling and Anaphylaxis    And face swelling Face swelling   . Sulfonamide Derivatives Swelling    Face swelling   . Glycopyrrolate Other (See Comments)    Unable to urinate Could not urinate after it was given  . Latex Other (See Comments)    Blisters  . Methscopolamine Other (See Comments)    Unable to urinate Unable to urinate  . Ace Inhibitors Hives  . Codeine Nausea Only  . Iodine Hives  . Iohexol Hives     Desc: 50 mg benadryl prior to exam     Social History: The patient  reports that she has quit smoking. She smoked 0.33 packs per day. She has never used smokeless tobacco. She reports current alcohol use. She reports that she does not use drugs.   Family History: The patient's family history includes Colon cancer (age of onset: 34) in her father; Colon polyps in her brother and brother; Prostate cancer in her brother; Stomach cancer in her maternal grandfather and maternal uncle.   Review of Systems: Please see the history of present illness.   All other systems are reviewed and negative.   Physical Exam: VS:  BP 124/64   Pulse 70   Ht 5\' 2"  (1.575 m)   Wt 131 lb 3.2 oz (59.5 kg)    SpO2 97%   BMI 24.00 kg/m  .  BMI Body mass index is 24 kg/m.  Wt Readings from Last 3 Encounters:  05/29/20 131 lb 3.2 oz (59.5 kg)  05/08/20 132 lb 9.6 oz (60.1 kg)  04/16/20 133 lb 3.2 oz (60.4 kg)    General: Pleasant. She looks younger than her stated age. She is alert and in no acute distress.   Cardiac: Regular rate and rhythm. No murmurs, rubs, or gallops. No edema.  Respiratory:  Lungs are clear to auscultation bilaterally with normal work of breathing.  GI: Soft and nontender.  MS: No deformity or atrophy. Gait and ROM intact.  Skin: Warm and dry. Color is normal.  Neuro:  Strength and sensation are intact and no gross focal deficits noted.  Psych: Alert, appropriate and with normal affect.   LABORATORY DATA:  EKG:  EKG is ordered today.  Personally reviewed by me. This demonstrates NSR.  Lab Results  Component Value Date   WBC 8.8 05/08/2020   HGB 13.2 05/08/2020   HCT 39.8 05/08/2020   PLT 238 05/08/2020   GLUCOSE 92 05/08/2020   CHOL 178 01/08/2020   TRIG 149 01/08/2020   HDL 51 01/08/2020   LDLCALC 101 (H) 01/08/2020   ALT 20 05/08/2020   AST 21 05/08/2020   NA 143 05/08/2020   K 4.1 05/08/2020   CL 109 05/08/2020   CREATININE 0.77 05/08/2020   BUN 14 05/08/2020   CO2 25 05/08/2020   TSH 3.110 06/19/2019   INR 0.94 06/16/2018   HGBA1C 5.3 10/05/2019     BNP (last 3 results) No results for input(s): BNP in the last 8760 hours.  ProBNP (last 3 results) No results for input(s): PROBNP in the last 8760 hours.   Other Studies Reviewed Today:  Echo Study Conclusions 10/2018   IMPRESSIONS   1. The left ventricle has normal systolic function, with an ejection fraction of 55-60%. The cavity size was normal. Left ventricular diastolic Doppler parameters are consistent with impaired relaxation.  2. The right ventricle has normal systolic function. The cavity was normal. There is no increase in right ventricular wall thickness.  3. Borderline bileaflet  prolapse of the mitral valve leaflets with trivial central mitral regurgitation.  4. The aortic valve is tricuspid. Mild sclerosis of the aortic valve. No stenosis of the aortic valve.  5. The aortic root and ascending aorta are normal in size and structure.     30-day monitor 03/06/2018 sinus bradycardia to sinus tachycardia with 3 episodes of NSVT: 2 5 beats and one 6 beat   Cardiac MRI 03/2018 IMPRESSION: 1. Normal left ventricular size, thickness and systolic function (LVEF =35%). There is paradoxical septal motion. There is no late gadolinium enhancement in the left ventricular myocardium.   2. Normal right ventricular size, thickness and systolic function (LVEF = 63%). There are no regional wall motion abnormalities.   3.  Normal left and right atrial size.   4. Normal size of the aortic root, ascending aorta and pulmonary artery.   5. Anterior mitral valve leaflet prolapse with mitral regurgitation. Mild tricuspid regurgitation.   6.  Normal pericardium.  No pericardial effusion.   Normal cardiac MRI without evidence for inflammatory cardiomyopathy or ARVC.    Electronically Signed   By: Ena Dawley   On: 03/28/2018 19:07     CARDIAC CATH Final Conclusions 10/2013:   1. Normal coronary anatomy 2. Normal LV function   Recommendations: medical therapy.   Peter Martinique, [place]   10/16/2013, 12:01 PM   MYOVIEW 2015 Overall Impression:  High risk stress nuclear study normal myocardial perfusion but patient had significant EKG changes of ischemia with hypotensive BP response to exercise and transient ischemic dilatation of the LV..   LV Ejection Fraction: 64%.  LV Wall Motion:  NL LV Function; NL Wall Motion   Signed: Fransico Him, MD     ASSESSMENT & PLan:     1. HTN - BP is fine on her current regimen.  2. Palpitations - have asked her to stop her Tumeric - I have had several patients that this has exacerbated their palpitations. Could consider  increasing her beta blocker as well if needed. f  3. HLD - check lipids today. Ok to do drug holiday for 2 weeks - she will let us know if she improves or not.   4. MVP - only with mild MR - following - she has no worrisome symptoms.    5. Recurrent non-Hodgkin's lymphoma - per oncology.    Current medicines are reviewed with the patient today.  The patient does not have concerns regarding medicines other than what has been noted above.  The following changes have been made:  See above.  Labs/ tests ordered today include:    Orders Placed This Encounter  Procedures  . Lipid panel  . EKG 12-Lead     Disposition:   FU with Dr. Meda Coffee in 6 months. She is aware that I am leaving next month.     Patient is agreeable to this plan and will call if any problems develop in the interim.   SignedTruitt Merle, NP  05/29/2020 10:36 AM  [facility] [address] [place], [place]  [address] Phone: [phone] Fax: [phone]

## 2020-05-26 ENCOUNTER — Ambulatory Visit: Payer: Medicare Other | Admitting: Nurse Practitioner

## 2020-05-27 ENCOUNTER — Ambulatory Visit: Payer: Medicare Other | Admitting: Nurse Practitioner

## 2020-05-29 ENCOUNTER — Encounter: Payer: Self-pay | Admitting: Nurse Practitioner

## 2020-05-29 ENCOUNTER — Other Ambulatory Visit: Payer: Self-pay

## 2020-05-29 ENCOUNTER — Ambulatory Visit (INDEPENDENT_AMBULATORY_CARE_PROVIDER_SITE_OTHER): Payer: Medicare Other | Admitting: Nurse Practitioner

## 2020-05-29 VITALS — BP 124/64 | HR 70 | Ht 62.0 in | Wt 131.2 lb

## 2020-05-29 DIAGNOSIS — I10 Essential (primary) hypertension: Secondary | ICD-10-CM

## 2020-05-29 DIAGNOSIS — R002 Palpitations: Secondary | ICD-10-CM

## 2020-05-29 DIAGNOSIS — E782 Mixed hyperlipidemia: Secondary | ICD-10-CM

## 2020-05-29 LAB — LIPID PANEL
Chol/HDL Ratio: 2.9 ratio (ref 0.0–4.4)
Cholesterol, Total: 144 mg/dL (ref 100–199)
HDL: 49 mg/dL (ref >39)
LDL Chol Calc (NIH): 74 mg/dL (ref 0–99)
Triglycerides: 119 mg/dL (ref 0–149)
VLDL Cholesterol Cal: 21 mg/dL (ref 5–40)

## 2020-05-29 NOTE — Patient Instructions (Addendum)
After Visit Summary:  We will be checking the following labs today - Lipids   Medication Instructions:    Continue with your current medicines. BUT  Ok to stop Lipitor for the next 2 weeks and see if your soreness gets better - if it does - let me know - if it does not - restart your medicine  Let's stop Tumeric   If you need a refill on your cardiac medications before your next appointment, please call your pharmacy.     Testing/Procedures To Be Arranged:  N/A  Follow-Up:   See Dr. Meda Coffee in 6 months -  You will receive a reminder letter in the mail two months in advance. If you don't receive a letter, please call our office to schedule the follow-up appointment.     At [facility], you and your health needs are our priority.  As part of our continuing mission to provide you with exceptional heart care, we have created designated Provider Care Teams.  These Care Teams include your primary Cardiologist (physician) and Advanced Practice Providers (APPs -  Physician Assistants and Nurse Practitioners) who all work together to provide you with the care you need, when you need it.  Special Instructions:  . Stay safe, wash your hands for at least 20 seconds and wear a mask when needed.  . It was good to talk with you today.    Call the [place] office at [phone] if you have any questions, problems or concerns.

## 2020-05-30 ENCOUNTER — Telehealth: Payer: Self-pay | Admitting: Cardiology

## 2020-05-30 NOTE — Telephone Encounter (Signed)
Patient is returning call to discuss lab results. 

## 2020-05-30 NOTE — Telephone Encounter (Signed)
Pt advised and will callback in 2 weeks to let us know how she [place] doing.

## 2020-06-11 ENCOUNTER — Other Ambulatory Visit: Payer: Self-pay | Admitting: Internal Medicine

## 2020-06-13 DIAGNOSIS — E782 Mixed hyperlipidemia: Secondary | ICD-10-CM

## 2020-06-16 ENCOUNTER — Other Ambulatory Visit: Payer: Self-pay | Admitting: *Deleted

## 2020-06-16 MED ORDER — ROSUVASTATIN CALCIUM 5 MG PO TABS: ORAL_TABLET | ORAL | 3 refills | Status: DC

## 2020-06-16 NOTE — Telephone Encounter (Signed)
S/w pt is aware of recommendations.  Pt will start crestor one tablet mon, wed fri ( 5 mg), sent into pt's requested pharmacy. Pt will come in on March 23 for fasting labs ordered under primary cardiologist.  appt made, orders in and linked.

## 2020-06-18 ENCOUNTER — Ambulatory Visit: Payer: Medicare Other

## 2020-06-19 DIAGNOSIS — M7541 Impingement syndrome of right shoulder: Secondary | ICD-10-CM | POA: Diagnosis not present

## 2020-06-29 ENCOUNTER — Other Ambulatory Visit: Payer: Self-pay | Admitting: Oncology

## 2020-07-03 ENCOUNTER — Inpatient Hospital Stay (HOSPITAL_BASED_OUTPATIENT_CLINIC_OR_DEPARTMENT_OTHER): Payer: Medicare Other | Admitting: Nurse Practitioner

## 2020-07-03 ENCOUNTER — Inpatient Hospital Stay: Payer: Medicare Other | Attending: Oncology

## 2020-07-03 ENCOUNTER — Inpatient Hospital Stay: Payer: Medicare Other

## 2020-07-03 ENCOUNTER — Other Ambulatory Visit: Payer: Self-pay

## 2020-07-03 ENCOUNTER — Encounter: Payer: Self-pay | Admitting: Nurse Practitioner

## 2020-07-03 VITALS — BP 111/54 | HR 69 | Temp 98.8°F | Resp 16

## 2020-07-03 VITALS — BP 150/71 | HR 73 | Temp 98.3°F | Resp 18 | Ht 62.0 in | Wt 129.4 lb

## 2020-07-03 DIAGNOSIS — Z79899 Other long term (current) drug therapy: Secondary | ICD-10-CM | POA: Insufficient documentation

## 2020-07-03 DIAGNOSIS — C8203 Follicular lymphoma grade I, intra-abdominal lymph nodes: Secondary | ICD-10-CM

## 2020-07-03 DIAGNOSIS — M549 Dorsalgia, unspecified: Secondary | ICD-10-CM | POA: Diagnosis not present

## 2020-07-03 DIAGNOSIS — Z5112 Encounter for antineoplastic immunotherapy: Secondary | ICD-10-CM | POA: Insufficient documentation

## 2020-07-03 DIAGNOSIS — M8580 Other specified disorders of bone density and structure, unspecified site: Secondary | ICD-10-CM | POA: Diagnosis not present

## 2020-07-03 DIAGNOSIS — K589 Irritable bowel syndrome without diarrhea: Secondary | ICD-10-CM | POA: Diagnosis not present

## 2020-07-03 LAB — CBC WITH DIFFERENTIAL (CANCER CENTER ONLY)
Abs Immature Granulocytes: 0.04 10*3/uL (ref 0.00–0.07)
Basophils Absolute: 0.1 10*3/uL (ref 0.0–0.1)
Basophils Relative: 1 %
Eosinophils Absolute: 0.1 10*3/uL (ref 0.0–0.5)
Eosinophils Relative: 1 %
HCT: 43.1 % (ref 36.0–46.0)
Hemoglobin: 14.6 g/dL (ref 12.0–15.0)
Immature Granulocytes: 0 %
Lymphocytes Relative: 12 %
Lymphs Abs: 1.4 10*3/uL (ref 0.7–4.0)
MCH: 30.5 pg (ref 26.0–34.0)
MCHC: 33.9 g/dL (ref 30.0–36.0)
MCV: 90 fL (ref 80.0–100.0)
Monocytes Absolute: 1.2 10*3/uL — ABNORMAL HIGH (ref 0.1–1.0)
Monocytes Relative: 11 %
Neutro Abs: 8.9 10*3/uL — ABNORMAL HIGH (ref 1.7–7.7)
Neutrophils Relative %: 75 %
Platelet Count: 294 10*3/uL (ref 150–400)
RBC: 4.79 MIL/uL (ref 3.87–5.11)
RDW: 12.9 % (ref 11.5–15.5)
WBC Count: 11.7 10*3/uL — ABNORMAL HIGH (ref 4.0–10.5)
nRBC: 0 % (ref 0.0–0.2)

## 2020-07-03 LAB — CMP (CANCER CENTER ONLY)
ALT: 16 U/L (ref 0–44)
AST: 19 U/L (ref 15–41)
Albumin: 4 g/dL (ref 3.5–5.0)
Alkaline Phosphatase: 70 U/L (ref 38–126)
Anion gap: 7 (ref 5–15)
BUN: 19 mg/dL (ref 8–23)
CO2: 23 mmol/L (ref 22–32)
Calcium: 10.4 mg/dL — ABNORMAL HIGH (ref 8.9–10.3)
Chloride: 107 mmol/L (ref 98–111)
Creatinine: 0.84 mg/dL (ref 0.44–1.00)
GFR, Estimated: >60 mL/min (ref >60)
Glucose, Bld: 86 mg/dL (ref 70–99)
Potassium: 4.2 mmol/L (ref 3.5–5.1)
Sodium: 137 mmol/L (ref 135–145)
Total Bilirubin: 0.4 mg/dL (ref 0.3–1.2)
Total Protein: 6.7 g/dL (ref 6.5–8.1)

## 2020-07-03 LAB — LACTATE DEHYDROGENASE: LDH: 237 U/L — ABNORMAL HIGH (ref 98–192)

## 2020-07-03 MED ORDER — DIPHENHYDRAMINE HCL 25 MG PO CAPS
ORAL_CAPSULE | ORAL | Status: AC
  Filled 2020-07-03: qty 2

## 2020-07-03 MED ORDER — ACETAMINOPHEN 325 MG PO TABS
ORAL_TABLET | ORAL | Status: AC
  Filled 2020-07-03: qty 2

## 2020-07-03 MED ORDER — SODIUM CHLORIDE 0.9 % IV SOLN
Freq: Once | INTRAVENOUS | Status: AC
  Filled 2020-07-03: qty 250

## 2020-07-03 MED ORDER — DIPHENHYDRAMINE HCL 25 MG PO CAPS
50.0000 mg | ORAL_CAPSULE | Freq: Once | ORAL | Status: AC
  Administered 2020-07-03: 50 mg via ORAL

## 2020-07-03 MED ORDER — SODIUM CHLORIDE 0.9 % IV SOLN
375.0000 mg/m2 | Freq: Once | INTRAVENOUS | Status: AC
  Administered 2020-07-03: 600 mg via INTRAVENOUS
  Filled 2020-07-03: qty 10

## 2020-07-03 MED ORDER — ACETAMINOPHEN 325 MG PO TABS
650.0000 mg | ORAL_TABLET | Freq: Once | ORAL | Status: AC
Start: 2020-07-03 — End: 2020-07-03
  Administered 2020-07-03: 650 mg via ORAL

## 2020-07-03 NOTE — Progress Notes (Signed)
  [facility] OFFICE PROGRESS NOTE   Diagnosis: Non-Hodgkin's lymphoma  INTERVAL HISTORY:   [name] [name] returns as scheduled.  She completed another treatment with rituximab on 05/08/2020.  No signs of an allergic reaction.  No fevers or sweats.  Good appetite.  She has lost a few pounds.  She has not noticed any enlarged lymph nodes.  She reports right shoulder pain with a recent steroid injection.  For the past 4 to 5 weeks she has had an intermittent "achy" pain at the left upper back, no known injury, exacerbated by certain activities.  Objective:  Vital signs in last 24 hours:  Blood pressure (!) 150/71, pulse 73, temperature 98.3 F (36.8 C), resp. rate 18, height 5\' 2"  (1.575 m), weight 129 lb 6.4 oz (58.7 kg), SpO2 100 %.    HEENT: Neck without mass. Lymphatics: No palpable cervical, supraclavicular, axillary or inguinal lymph nodes. Resp: Lungs clear bilaterally. Cardio: Regular rate and rhythm. GI: Abdomen soft and nontender.  No hepatosplenomegaly. Vascular: No leg edema. Musculoskeletal: Nontender over the left scapula/mid back.  Skin: No rash.   Lab Results:  Lab Results  Component Value Date   WBC 8.8 05/08/2020   HGB 13.2 05/08/2020   HCT 39.8 05/08/2020   MCV 90.0 05/08/2020   PLT 238 05/08/2020   NEUTROABS 6.3 05/08/2020    Imaging:  No results found.  Medications: I have reviewed the patient's current medications.  Assessment/Plan: 1. Non-Hodgkin's lymphoma-follicular lymphoma  Mesenteric lymph node biopsy 07/15/1999-follicular center cell lymphoma, grade 2  Treated with mitoxantrone, fludarabine, and rituximab completed in January 2002  Progressive disease in February 2004, treated with single agent rituximab February 2004 through August 2005  CTs 06/21/2014-no evidence of lymphoma  CT abdomen/pelvis 06/05/2018-enlarging left mid abdominal mesenteric mass  CT biopsy of the mesenteric mass 06/16/2018-follicular lymphoma,  low-grade, CD20 positive  Cycle 1 weekly Rituxan 07/24/2018  Cycle 2 weekly Rituxan 07/31/2018  Cycle 3 weekly Rituxan 08/07/2018  Cycle 4 weekly Rituxan 08/14/2018  CT abdomen/pelvis 10/09/2018-reduction in size of left lower quadrant mesenteric mass  Cycle 1 maintenance rituximab 10/19/2018  Cycle 2 maintenance Rituxan 12/14/2018  Cycle 3 maintenance Rituxan 02/08/2019  Cycle 4 maintenance Rituxan 04/09/2019  CT abdomen/pelvis 05/31/2019-no change in jejunal mesenteric mass, no evidence of disease progression.  Cycle 5 maintenance rituximab 06/07/2019  Cycle 6 maintenance rituximab 08/02/2019  Cycle 7 maintenance rituximab 09/27/2019  CT abdomen/pelvis 11/20/2019-further contraction of the lesion at the small bowel mesentery, no evidence of progressive lymphoma  Cycle 8 maintenance rituximab 11/22/2019  Cycle 9 maintenance rituximab 01/17/2020  Cycle 10 maintenance rituximab 03/13/2020  Cycle 11 maintenance rituximab 05/08/2020  Cycle 12 maintenance rituximab 07/03/2020 2. Irritable bowel syndrome 3. History of anal incontinence 4. Osteopenia 5. History of colon polyps 6. Weakness, flushing, presyncope and chills during Rituxan infusion 07/24/2018. 7. Mild hypercalcemia-chronic   Disposition: [name] [name] appears stable.  There is no clinical evidence of progression of the lymphoma.  Plan to proceed with rituximab today as scheduled.  Final cycle of maintenance rituximab in 8 weeks.  Etiology of the left back pain is unclear.  She will contact the office if symptoms persist/worsen or she develops any new symptoms.  She will return for lab, follow-up, Rituxan in 8 weeks.  We are available to see her sooner if needed.  Plan reviewed with Dr. Benay Spice.    Ned Card ANP/GNP-BC   07/03/2020  9:12 AM

## 2020-07-03 NOTE — Patient Instructions (Signed)
Rituximab Injection What is this medicine? RITUXIMAB (ri TUX i mab) is a monoclonal antibody. It is used to treat certain types of cancer like non-Hodgkin lymphoma and chronic lymphocytic leukemia. It is also used to treat rheumatoid arthritis, granulomatosis with polyangiitis, microscopic polyangiitis, and pemphigus vulgaris. This medicine may be used for other purposes; ask your health care provider or pharmacist if you have questions. COMMON BRAND NAME(S): RIABNI, Rituxan, RUXIENCE What should I tell my health care provider before I take this medicine? They need to know if you have any of these conditions:  chest pain  heart disease  infection especially a viral infection such as chickenpox, cold sores, hepatitis B, or herpes  immune system problems  irregular heartbeat or rhythm  kidney disease  low blood counts (white cells, platelets, or red cells)  lung disease  recent or upcoming vaccine  an unusual or allergic reaction to rituximab, other medicines, foods, dyes, or preservatives  pregnant or trying to get pregnant  breast-feeding How should I use this medicine? This medicine is injected into a vein. It is given by a health care provider in a hospital or clinic setting. A special MedGuide will be given to you before each treatment. Be sure to read this information carefully each time. Talk to your health care provider about the use of this medicine in children. While this drug may be prescribed for children as young as 2 years for selected conditions, precautions do apply. Overdosage: If you think you have taken too much of this medicine contact a poison control center or emergency room at once. NOTE: This medicine is only for you. Do not share this medicine with others. What if I miss a dose? Keep appointments for follow-up doses. It is important not to miss your dose. Call your health care provider if you are unable to keep an appointment. What may interact with this  medicine? Do not take this medicine with any of the following medicines:  live vaccines This medicine may also interact with the following medicines:  cisplatin This list may not describe all possible interactions. Give your health care provider a list of all the medicines, herbs, non-prescription drugs, or dietary supplements you use. Also tell them if you smoke, drink alcohol, or use illegal drugs. Some items may interact with your medicine. What should I watch for while using this medicine? Your condition will be monitored carefully while you are receiving this medicine. You may need blood work done while you are taking this medicine. This medicine can cause serious infusion reactions. To reduce the risk your health care provider may give you other medicines to take before receiving this one. Be sure to follow the directions from your health care provider. This medicine may increase your risk of getting an infection. Call your health care provider for advice if you get a fever, chills, sore throat, or other symptoms of a cold or flu. Do not treat yourself. Try to avoid being around people who are sick. Call your health care provider if you are around anyone with measles, chickenpox, or if you develop sores or blisters that do not heal properly. Avoid taking medicines that contain aspirin, acetaminophen, ibuprofen, naproxen, or ketoprofen unless instructed by your health care provider. These medicines may hide a fever. This medicine may cause serious skin reactions. They can happen weeks to months after starting the medicine. Contact your health care provider right away if you notice fevers or flu-like symptoms with a rash. The rash may be red   or purple and then turn into blisters or peeling of the skin. Or, you might notice a red rash with swelling of the face, lips or lymph nodes in your neck or under your arms. In some patients, this medicine may cause a serious brain infection that may cause  death. If you have any problems seeing, thinking, speaking, walking, or standing, tell your healthcare professional right away. If you cannot reach your healthcare professional, urgently seek other source of medical care. Do not become pregnant while taking this medicine or for at least 12 months after stopping it. Women should inform their health care provider if they wish to become pregnant or think they might be pregnant. There is potential for serious harm to an unborn child. Talk to your health care provider for more information. Women should use a reliable form of birth control while taking this medicine and for 12 months after stopping it. Do not breast-feed while taking this medicine or for at least 6 months after stopping it. What side effects may I notice from receiving this medicine? Side effects that you should report to your health care provider as soon as possible:  allergic reactions (skin rash, itching or hives; swelling of the face, lips, or tongue)  diarrhea  edema (sudden weight gain; swelling of the ankles, feet, hands or other unusual swelling; trouble breathing)  fast, irregular heartbeat  heart attack (trouble breathing; pain or tightness in the chest, neck, back or arms; unusually weak or tired)  infection (fever, chills, cough, sore throat, pain or trouble passing urine)  kidney injury (trouble passing urine or change in the amount of urine)  liver injury (dark yellow or brown urine; general ill feeling or flu-like symptoms; loss of appetite, right upper belly pain; unusually weak or tired, yellowing of the eyes or skin)  low blood pressure (dizziness; feeling faint or lightheaded, falls; unusually weak or tired)  low red blood cell counts (trouble breathing; feeling faint; lightheaded, falls; unusually weak or tired)  mouth sores  redness, blistering, peeling, or loosening of the skin, including inside the mouth  stomach pain  unusual bruising or  bleeding  wheezing (trouble breathing with loud or whistling sounds)  vomiting Side effects that usually do not require medical attention (report to your health care provider if they continue or are bothersome):  headache  joint pain  muscle cramps, pain  nausea This list may not describe all possible side effects. Call your doctor for medical advice about side effects. You may report side effects to FDA at 1-800-FDA-1088. Where should I keep my medicine? This medicine is given in a hospital or clinic. It will not be stored at home. NOTE: This sheet is a summary. It may not cover all possible information. If you have questions about this medicine, talk to your doctor, pharmacist, or health care provider.  2021 Elsevier/Gold Standard (2020-01-31 21:35:50)

## 2020-07-04 ENCOUNTER — Telehealth: Payer: Self-pay | Admitting: Nurse Practitioner

## 2020-07-04 NOTE — Telephone Encounter (Signed)
Scheduled appts per 3/3 los. Pt confirmed appt date/time.

## 2020-07-16 ENCOUNTER — Telehealth: Payer: Self-pay

## 2020-07-16 NOTE — Telephone Encounter (Signed)
Patient is scheduled in June for a CPE with Hoyle Sauer and would like to have her labs done prior at [facility].

## 2020-07-18 NOTE — Telephone Encounter (Signed)
Patient notified

## 2020-07-18 NOTE — Telephone Encounter (Signed)
All of her major labs including cholesterol were done within the past month so no labs for PE. Thanks.

## 2020-07-22 ENCOUNTER — Other Ambulatory Visit (HOSPITAL_COMMUNITY): Payer: Self-pay | Admitting: Family Medicine

## 2020-07-22 DIAGNOSIS — Z1231 Encounter for screening mammogram for malignant neoplasm of breast: Secondary | ICD-10-CM

## 2020-07-23 ENCOUNTER — Other Ambulatory Visit: Payer: Medicare Other | Admitting: *Deleted

## 2020-07-23 ENCOUNTER — Other Ambulatory Visit: Payer: Self-pay

## 2020-07-23 DIAGNOSIS — E782 Mixed hyperlipidemia: Secondary | ICD-10-CM

## 2020-07-23 DIAGNOSIS — G245 Blepharospasm: Secondary | ICD-10-CM | POA: Diagnosis not present

## 2020-07-23 LAB — HEPATIC FUNCTION PANEL
ALT: 19 IU/L (ref 0–32)
AST: 24 IU/L (ref 0–40)
Albumin: 4.2 g/dL (ref 3.7–4.7)
Alkaline Phosphatase: 63 IU/L (ref 44–121)
Bilirubin Total: 0.3 mg/dL (ref 0.0–1.2)
Bilirubin, Direct: <0.1 mg/dL (ref 0.00–0.40)
Total Protein: 6.1 g/dL (ref 6.0–8.5)

## 2020-07-23 LAB — LIPID PANEL
Chol/HDL Ratio: 2.3 ratio (ref 0.0–4.4)
Cholesterol, Total: 135 mg/dL (ref 100–199)
HDL: 60 mg/dL (ref >39)
LDL Chol Calc (NIH): 63 mg/dL (ref 0–99)
Triglycerides: 52 mg/dL (ref 0–149)
VLDL Cholesterol Cal: 12 mg/dL (ref 5–40)

## 2020-07-25 ENCOUNTER — Ambulatory Visit: Payer: Medicare Other

## 2020-07-28 ENCOUNTER — Ambulatory Visit (INDEPENDENT_AMBULATORY_CARE_PROVIDER_SITE_OTHER): Payer: Medicare Other | Admitting: Family Medicine

## 2020-07-28 ENCOUNTER — Telehealth: Payer: Self-pay | Admitting: Oncology

## 2020-07-28 ENCOUNTER — Encounter: Payer: Self-pay | Admitting: Family Medicine

## 2020-07-28 ENCOUNTER — Other Ambulatory Visit: Payer: Self-pay

## 2020-07-28 VITALS — BP 136/70 | HR 71 | Temp 97.3°F | Wt 132.6 lb

## 2020-07-28 DIAGNOSIS — R350 Frequency of micturition: Secondary | ICD-10-CM | POA: Insufficient documentation

## 2020-07-28 DIAGNOSIS — N3000 Acute cystitis without hematuria: Secondary | ICD-10-CM | POA: Diagnosis not present

## 2020-07-28 LAB — POCT URINALYSIS DIPSTICK
Spec Grav, UA: <=1.005 — AB (ref 1.010–1.025)
pH, UA: 6 (ref 5.0–8.0)

## 2020-07-28 MED ORDER — CIPROFLOXACIN HCL 500 MG PO TABS: 500.0000 mg | ORAL_TABLET | Freq: Two times a day (BID) | ORAL | 0 refills | Status: DC

## 2020-07-28 NOTE — Patient Instructions (Signed)
Urinary Tract Infection, Adult A urinary tract infection (UTI) is an infection of any part of the urinary tract. The urinary tract includes:  The kidneys.  The ureters.  The bladder.  The urethra. These organs make, store, and get rid of pee (urine) in the body. What are the causes? This infection is caused by germs (bacteria) in your genital area. These germs grow and cause swelling (inflammation) of your urinary tract. What increases the risk? The following factors may make you more likely to develop this condition:  Using a small, thin tube (catheter) to drain pee.  Not being able to control when you pee or poop (incontinence).  Being female. If you are female, these things can increase the risk: ? Using these methods to prevent pregnancy:  A medicine that kills sperm (spermicide).  A device that blocks sperm (diaphragm). ? Having low levels of a female hormone (estrogen). ? Being pregnant. You are more likely to develop this condition if:  You have genes that add to your risk.  You are sexually active.  You take antibiotic medicines.  You have trouble peeing because of: ? A prostate that is bigger than normal, if you are female. ? A blockage in the part of your body that drains pee from the bladder. ? A kidney stone. ? A nerve condition that affects your bladder. ? Not getting enough to drink. ? Not peeing often enough.  You have other conditions, such as: ? Diabetes. ? A weak disease-fighting system (immune system). ? Sickle cell disease. ? Gout. ? Injury of the spine. What are the signs or symptoms? Symptoms of this condition include:  Needing to pee right away.  Peeing small amounts often.  Pain or burning when peeing.  Blood in the pee.  Pee that smells bad or not like normal.  Trouble peeing.  Pee that is cloudy.  Fluid coming from the vagina, if you are female.  Pain in the belly or lower back. Other symptoms include:  Vomiting.  Not  feeling hungry.  Feeling mixed up (confused). This may be the first symptom in older adults.  Being tired and grouchy (irritable).  A fever.  Watery poop (diarrhea). How is this treated?  Taking antibiotic medicine.  Taking other medicines.  Drinking enough water. In some cases, you may need to see a specialist. Follow these instructions at home: Medicines  Take over-the-counter and prescription medicines only as told by your doctor.  If you were prescribed an antibiotic medicine, take it as told by your doctor. Do not stop taking it even if you start to feel better. General instructions  Make sure you: ? Pee until your bladder is empty. ? Do not hold pee for a long time. ? Empty your bladder after sex. ? Wipe from front to back after peeing or pooping if you are a female. Use each tissue one time when you wipe.  Drink enough fluid to keep your pee pale yellow.  Keep all follow-up visits.   Contact a doctor if:  You do not get better after 1-2 days.  Your symptoms go away and then come back. Get help right away if:  You have very bad back pain.  You have very bad pain in your lower belly.  You have a fever.  You have chills.  You feeling like you will vomit or you vomit. Summary  A urinary tract infection (UTI) is an infection of any part of the urinary tract.  This condition is caused by   germs in your genital area.  There are many risk factors for a UTI.  Treatment includes antibiotic medicines.  Drink enough fluid to keep your pee pale yellow. This information is not intended to replace advice given to you by your health care provider. Make sure you discuss any questions you have with your health care provider. Document Revised: 11/30/2019 Document Reviewed: 11/30/2019 Elsevier Patient Education  2021 Elsevier Inc.  

## 2020-07-28 NOTE — Progress Notes (Signed)
Pt having urgency and frequency; began this morning. Pt has ongoing back pain. No meds at this time. No blood in urine that pt has seen.     Patient ID: [name] [name], female    DOB: 02/08/1941, 80 y.o.   MRN: [id number]   Chief Complaint  Patient presents with  . Urinary Tract Infection   Subjective:  CC: urinary frequency and urgency   This is a new problem.  Presents today for an acute visit with a complaint of urinary urgency and frequency and back pain.  Reports that she has ongoing chronic back pain and this pain is in the same general area.  Denies flank pain.  Denies seeing any blood in her urine.  Symptoms started this morning.  Has not tried anything for her symptoms.  Denies fever, chills, chest pain, shortness of breath.    Medical History Fey has a past medical history of Anal sphincter incompetence (12/31/2015), Arthritis, Brady-tachy syndrome ([place]), Chest pain (10/16/13), Colitis, ischemic ([place]), Colon polyp (07/29/1993), Diverticulitis, Diverticulosis of colon, Dyspepsia, Dyssynergic defecation (12/31/2015), Fatty liver, GERD (gastroesophageal reflux disease), Heart murmur, HLD (hyperlipidemia), Hypertension, IBS (irritable bowel syndrome), Incontinence, feces, MVP (mitral valve prolapse), nhl (dx'd 07/1999/ 06/2018), Osteopenia, Palpitation, Rectocele, Rectocele, and Vasovagal syncope.   Outpatient Encounter Medications as of 07/28/2020  Medication Sig  . ALPRAZolam (XANAX) 0.25 MG tablet Take 1 tablet (0.25 mg total) by mouth daily as needed for anxiety.  Marland Kitchen atenolol (TENORMIN) 25 MG tablet TAKE 1.5 TABLETS (37.5MG  TOTAL) BY MOUTHDAILY.  . ciprofloxacin (CIPRO) 500 MG tablet Take 1 tablet (500 mg total) by mouth 2 (two) times daily.  . fexofenadine (ALLEGRA) 180 MG tablet Take 180 mg by mouth daily as needed for allergies.  . pantoprazole (PROTONIX) 40 MG tablet TAKE 1 TABLET BY MOUTH ONCE DAILY BEFORE BREAKFAST  . Polyvinyl Alcohol-Povidone (REFRESH OP) Apply 1 drop to eye  3 (three) times daily.  . Probiotic Product (ALIGN PO) Take 1 capsule by mouth daily. Alternates between [place] per PCP orders every few months  . rosuvastatin (CRESTOR) 5 MG tablet Mon, Wed and Fri only.  . TURMERIC PO Take 1 capsule by mouth daily.   Marland Kitchen UNABLE TO FIND Apply topically.  . Wheat Dextrin (BENEFIBER) POWD Take 1 Dose by mouth daily.   No facility-administered encounter medications on file as of 07/28/2020.     Review of Systems  Constitutional: Negative for chills and fever.  Respiratory: Negative for shortness of breath.   Cardiovascular: Negative for chest pain.  Gastrointestinal: Negative for abdominal pain.  Genitourinary: Positive for dysuria, frequency and urgency.  Musculoskeletal: Positive for back pain.       Low back pain not new. No flank pain.      Vitals BP 136/70   Pulse 71   Temp (!) 97.3 F (36.3 C)   Wt 132 lb 9.6 oz (60.1 kg)   SpO2 98%   BMI 24.25 kg/m   Objective:   Physical Exam Vitals reviewed.  Constitutional:      Appearance: Normal appearance.  Cardiovascular:     Rate and Rhythm: Normal rate and regular rhythm.     Heart sounds: Normal heart sounds.  Pulmonary:     Effort: Pulmonary effort is normal.     Breath sounds: Normal breath sounds.  Abdominal:     Tenderness: There is no right CVA tenderness or left CVA tenderness.  Skin:    General: Skin is warm and dry.  Neurological:  General: No focal deficit present.     Mental Status: She is alert.  Psychiatric:        Behavior: Behavior normal.     Results for orders placed or performed in visit on 07/28/20  POCT Urinalysis Dipstick  Result Value Ref Range   Color, UA     Clarity, UA     Glucose, UA     Bilirubin, UA     Ketones, UA     Spec Grav, UA <=1.005 (A) 1.010 - 1.025   Blood, UA     pH, UA 6.0 5.0 - 8.0   Protein, UA     Urobilinogen, UA     Nitrite, UA     Leukocytes, UA Small (1+) (A) Negative   Appearance     Odor       Assessment and Plan   1. Frequency of urination - POCT Urinalysis Dipstick - Urine Culture  2. Acute cystitis without hematuria - ciprofloxacin (CIPRO) 500 MG tablet; Take 1 tablet (500 mg total) by mouth 2 (two) times daily.  Dispense: 14 tablet; Refill: 0   Small leukocytes noted on urine dip.  Will treat for urinary tract infection.  Reports that she has taken Cipro in the past several times, tolerates well.  Warnings discussed.  Encouraged to stay hydrated, adequate fluid intake.  Agrees with plan of care discussed today. Understands warning signs to seek further care: chest pain, shortness of breath, any significant change in health.  Understands to follow-up if symptoms do not improve, or worsen.  Will send urine for culture, notify once results are available.    Chalmers Guest, NP 07/28/2020

## 2020-07-28 NOTE — Telephone Encounter (Signed)
I mailed updated appointment calendar to patient w/ change of location  

## 2020-07-30 LAB — URINE CULTURE

## 2020-07-30 LAB — SPECIMEN STATUS REPORT

## 2020-07-31 ENCOUNTER — Ambulatory Visit (HOSPITAL_COMMUNITY)
Admission: RE | Admit: 2020-07-31 | Discharge: 2020-07-31 | Disposition: A | Discharge status: Home / Self Care | Payer: Medicare Other | Source: Ambulatory Visit | Attending: Family Medicine | Admitting: Family Medicine

## 2020-07-31 DIAGNOSIS — Z1231 Encounter for screening mammogram for malignant neoplasm of breast: Secondary | ICD-10-CM | POA: Insufficient documentation

## 2020-08-24 ENCOUNTER — Other Ambulatory Visit: Payer: Self-pay | Admitting: Oncology

## 2020-08-28 ENCOUNTER — Inpatient Hospital Stay: Payer: Medicare Other

## 2020-08-28 ENCOUNTER — Ambulatory Visit: Payer: Medicare Other | Admitting: Oncology

## 2020-08-28 ENCOUNTER — Inpatient Hospital Stay: Payer: Medicare Other | Attending: Oncology | Admitting: Oncology

## 2020-08-28 ENCOUNTER — Ambulatory Visit: Payer: Medicare Other

## 2020-08-28 ENCOUNTER — Other Ambulatory Visit: Payer: Medicare Other

## 2020-08-28 ENCOUNTER — Other Ambulatory Visit: Payer: Self-pay

## 2020-08-28 ENCOUNTER — Other Ambulatory Visit: Payer: Self-pay | Admitting: Oncology

## 2020-08-28 VITALS — BP 126/53 | HR 65 | Temp 98.3°F | Resp 18

## 2020-08-28 VITALS — BP 149/61 | HR 62 | Temp 98.2°F | Resp 18 | Ht 62.0 in | Wt 132.6 lb

## 2020-08-28 DIAGNOSIS — C8203 Follicular lymphoma grade I, intra-abdominal lymph nodes: Secondary | ICD-10-CM | POA: Diagnosis not present

## 2020-08-28 DIAGNOSIS — Z5112 Encounter for antineoplastic immunotherapy: Secondary | ICD-10-CM | POA: Insufficient documentation

## 2020-08-28 DIAGNOSIS — M8580 Other specified disorders of bone density and structure, unspecified site: Secondary | ICD-10-CM | POA: Insufficient documentation

## 2020-08-28 DIAGNOSIS — Z79899 Other long term (current) drug therapy: Secondary | ICD-10-CM | POA: Diagnosis not present

## 2020-08-28 LAB — CBC WITH DIFFERENTIAL (CANCER CENTER ONLY)
Abs Immature Granulocytes: 0.03 10*3/uL (ref 0.00–0.07)
Basophils Absolute: 0.1 10*3/uL (ref 0.0–0.1)
Basophils Relative: 1 %
Eosinophils Absolute: 0.1 10*3/uL (ref 0.0–0.5)
Eosinophils Relative: 1 %
HCT: 47.6 % — ABNORMAL HIGH (ref 36.0–46.0)
Hemoglobin: 15.5 g/dL — ABNORMAL HIGH (ref 12.0–15.0)
Immature Granulocytes: 0 %
Lymphocytes Relative: 14 %
Lymphs Abs: 1.3 10*3/uL (ref 0.7–4.0)
MCH: 30.2 pg (ref 26.0–34.0)
MCHC: 32.6 g/dL (ref 30.0–36.0)
MCV: 92.6 fL (ref 80.0–100.0)
Monocytes Absolute: 1.1 10*3/uL — ABNORMAL HIGH (ref 0.1–1.0)
Monocytes Relative: 12 %
Neutro Abs: 6.8 10*3/uL (ref 1.7–7.7)
Neutrophils Relative %: 72 %
Platelet Count: 275 10*3/uL (ref 150–400)
RBC: 5.14 MIL/uL — ABNORMAL HIGH (ref 3.87–5.11)
RDW: 13.4 % (ref 11.5–15.5)
WBC Count: 9.3 10*3/uL (ref 4.0–10.5)
nRBC: 0 % (ref 0.0–0.2)

## 2020-08-28 LAB — CMP (CANCER CENTER ONLY)
ALT: 26 U/L (ref 0–44)
AST: 27 U/L (ref 15–41)
Albumin: 4.6 g/dL (ref 3.5–5.0)
Alkaline Phosphatase: 60 U/L (ref 38–126)
Anion gap: 9 (ref 5–15)
BUN: 12 mg/dL (ref 8–23)
CO2: 28 mmol/L (ref 22–32)
Calcium: 10.7 mg/dL — ABNORMAL HIGH (ref 8.9–10.3)
Chloride: 105 mmol/L (ref 98–111)
Creatinine: 0.68 mg/dL (ref 0.44–1.00)
GFR, Estimated: >60 mL/min (ref >60)
Glucose, Bld: 86 mg/dL (ref 70–99)
Potassium: 3.9 mmol/L (ref 3.5–5.1)
Sodium: 142 mmol/L (ref 135–145)
Total Bilirubin: 0.4 mg/dL (ref 0.3–1.2)
Total Protein: 6.8 g/dL (ref 6.5–8.1)

## 2020-08-28 LAB — LACTATE DEHYDROGENASE: LDH: 180 U/L (ref 98–192)

## 2020-08-28 MED ORDER — SODIUM CHLORIDE 0.9 % IV SOLN
375.0000 mg/m2 | Freq: Once | INTRAVENOUS | Status: AC
  Administered 2020-08-28: 600 mg via INTRAVENOUS
  Filled 2020-08-28: qty 50

## 2020-08-28 MED ORDER — SODIUM CHLORIDE 0.9% FLUSH
10.0000 mL | INTRAVENOUS | Status: DC | PRN
  Filled 2020-08-28: qty 10

## 2020-08-28 MED ORDER — PREDNISONE 50 MG PO TABS: 50.0000 mg | ORAL_TABLET | ORAL | 0 refills | Status: DC

## 2020-08-28 MED ORDER — HEPARIN SOD (PORK) LOCK FLUSH 100 UNIT/ML IV SOLN
500.0000 [IU] | Freq: Once | INTRAVENOUS | Status: DC | PRN
  Filled 2020-08-28: qty 5

## 2020-08-28 MED ORDER — ACETAMINOPHEN 325 MG PO TABS
650.0000 mg | ORAL_TABLET | Freq: Once | ORAL | Status: AC
  Administered 2020-08-28: 650 mg via ORAL
  Filled 2020-08-28: qty 2

## 2020-08-28 MED ORDER — SODIUM CHLORIDE 0.9 % IV SOLN
Freq: Once | INTRAVENOUS | Status: AC
  Filled 2020-08-28: qty 250

## 2020-08-28 MED ORDER — DIPHENHYDRAMINE HCL 25 MG PO CAPS
50.0000 mg | ORAL_CAPSULE | Freq: Once | ORAL | Status: AC
  Administered 2020-08-28: 50 mg via ORAL
  Filled 2020-08-28: qty 2

## 2020-08-28 NOTE — Patient Instructions (Signed)
[facility]  Discharge Instructions: Thank you for choosing [facility] to provide your oncology and hematology care.   If you have a lab appointment with the [facility], please go directly to the [facility] and check in at the registration area.   Wear comfortable clothing and clothing appropriate for easy access to any Portacath or PICC line.   We strive to give you quality time with your provider. You may need to reschedule your appointment if you arrive late (15 or more minutes).  Arriving late affects you and other patients whose appointments are after yours.  Also, if you miss three or more appointments without notifying the office, you may be dismissed from the clinic at the provider's discretion.      For prescription refill requests, have your pharmacy contact our office and allow 72 hours for refills to be completed.    Today you received the following chemotherapy and/or immunotherapy agents rituxan.  Return as scheduled.  Please call the clinic if you have any questions or concerns.   To help prevent nausea and vomiting after your treatment, we encourage you to take your nausea medication as directed.  BELOW ARE SYMPTOMS THAT SHOULD BE REPORTED IMMEDIATELY: . *FEVER GREATER THAN 100.4 F (38 C) OR HIGHER . *CHILLS OR SWEATING . *NAUSEA AND VOMITING THAT IS NOT CONTROLLED WITH YOUR NAUSEA MEDICATION . *UNUSUAL SHORTNESS OF BREATH . *UNUSUAL BRUISING OR BLEEDING . *URINARY PROBLEMS (pain or burning when urinating, or frequent urination) . *BOWEL PROBLEMS (unusual diarrhea, constipation, pain near the anus) . TENDERNESS IN MOUTH AND THROAT WITH OR WITHOUT PRESENCE OF ULCERS (sore throat, sores in mouth, or a toothache) . UNUSUAL RASH, SWELLING OR PAIN  . UNUSUAL VAGINAL DISCHARGE OR ITCHING   Items with * indicate a potential emergency and should be followed up as soon as possible or go to the Emergency Department if any problems  should occur.  Please show the CHEMOTHERAPY ALERT CARD or IMMUNOTHERAPY ALERT CARD at check-in to the Emergency Department and triage nurse.  Should you have questions after your visit or need to cancel or reschedule your appointment, please contact [facility]  Dept: [phone]  and follow the prompts.  Office hours are 8:00 a.m. to 4:30 p.m. Monday - Friday. Please note that voicemails left after 4:00 p.m. may not be returned until the following business day.  We are closed weekends and major holidays. You have access to a nurse at all times for urgent questions. Please call the main number to the clinic Dept: [phone] and follow the prompts.   For any non-urgent questions, you may also contact your provider using MyChart. We now offer e-Visits for anyone 34 and older to request care online for non-urgent symptoms. For details visit [email or web address].   Also download the MyChart app! Go to the app store, search "MyChart", open the app, select [facility], and log in with your MyChart username and password.  Due to Covid, a mask is required upon entering the hospital/clinic. If you do not have a mask, one will be given to you upon arrival. For doctor visits, patients may have 1 support person aged 6 or older with them. For treatment visits, patients cannot have anyone with them due to current Covid guidelines and our immunocompromised population.

## 2020-08-28 NOTE — Progress Notes (Signed)
[name] OFFICE PROGRESS NOTE   Diagnosis: Non-Hodgkin's lymphoma  INTERVAL HISTORY:   [name] [name] completed another treatment with rituximab on 07/03/2020.  She tolerated the rituximab well.  No symptom of allergic reaction.  No fever or night sweats.  She has occasional discomfort in the left abdomen.  She continues to have pain at the left upper back.  The pain is intermittent.  There are no associated symptoms.  She walks 3 miles per day.  Objective:  Vital signs in last 24 hours:  Blood pressure (!) 149/61, pulse 62, temperature 98.2 F (36.8 C), temperature source Oral, resp. rate 18, height 5\' 2"  (1.575 m), weight 132 lb 9.6 oz (60.1 kg), SpO2 100 %.    Lymphatics: No cervical, supraclavicular, axillary, or inguinal nodes Resp: Lungs clear bilaterally Cardio: Regular rate and rhythm GI: No mass, no hepatosplenomegaly, nontender Vascular: No leg edema Musculoskeletal: No spine or upper back tenderness   Lab Results:  Lab Results  Component Value Date   WBC 9.3 08/28/2020   HGB 15.5 (H) 08/28/2020   HCT 47.6 (H) 08/28/2020   MCV 92.6 08/28/2020   PLT 275 08/28/2020   NEUTROABS 6.8 08/28/2020    CMP  Lab Results  Component Value Date   NA 142 08/28/2020   K 3.9 08/28/2020   CL 105 08/28/2020   CO2 28 08/28/2020   GLUCOSE 86 08/28/2020   BUN 12 08/28/2020   CREATININE 0.68 08/28/2020   CALCIUM 10.7 (H) 08/28/2020   PROT 6.8 08/28/2020   ALBUMIN 4.6 08/28/2020   AST 27 08/28/2020   ALT 26 08/28/2020   ALKPHOS 60 08/28/2020   BILITOT 0.4 08/28/2020   GFRNONAA >60 08/28/2020   GFRAA >60 01/17/2020    No results found for: CEA1   Medications: I have reviewed the patient's current medications.   Assessment/Plan: 1. Non-Hodgkin's lymphoma-follicular lymphoma  Mesenteric lymph node biopsy 07/15/1999-follicular center cell lymphoma, grade 2  Treated with mitoxantrone, fludarabine, and rituximab completed in January 2002  Progressive  disease in February 2004, treated with single agent rituximab February 2004 through August 2005  CTs 06/21/2014-no evidence of lymphoma  CT abdomen/pelvis 06/05/2018-enlarging left mid abdominal mesenteric mass  CT biopsy of the mesenteric mass 06/16/2018-follicular lymphoma, low-grade, CD20 positive  Cycle 1 weekly Rituxan 07/24/2018  Cycle 2 weekly Rituxan 07/31/2018  Cycle 3 weekly Rituxan 08/07/2018  Cycle 4 weekly Rituxan 08/14/2018  CT abdomen/pelvis 10/09/2018-reduction in size of left lower quadrant mesenteric mass  Cycle 1 maintenance rituximab 10/19/2018  Cycle 2 maintenance Rituxan 12/14/2018  Cycle 3 maintenance Rituxan 02/08/2019  Cycle 4 maintenance Rituxan 04/09/2019  CT abdomen/pelvis 05/31/2019-no change in jejunal mesenteric mass, no evidence of disease progression.  Cycle 5 maintenance rituximab 06/07/2019  Cycle 6 maintenance rituximab 08/02/2019  Cycle 7 maintenance rituximab 09/27/2019  CT abdomen/pelvis 11/20/2019-further contraction of the lesion at the small bowel mesentery, no evidence of progressive lymphoma  Cycle 8 maintenance rituximab 11/22/2019  Cycle 9 maintenance rituximab 01/17/2020  Cycle 10 maintenance rituximab 03/13/2020  Cycle 11 maintenance rituximab 05/08/2020  Cycle 12 maintenance rituximab 07/03/2020  Cycle 13 maintenance rituximab 08/28/2020 2. Irritable bowel syndrome 3. History of anal incontinence 4. Osteopenia 5. History of colon polyps 6. Weakness, flushing, presyncope and chills during Rituxan infusion 07/24/2018. 7. Mild hypercalcemia-chronic     Disposition: [name] [name] appears unchanged.  She will complete a final planned treatment with maintenance rituximab today.  She will return for an office visit and restaging CTs in 2 months.  She will  be premedicated for the CTs based on her history of a contrast allergy.  The back discomfort is likely related to a benign musculoskeletal condition.  Betsy Coder, MD  08/28/2020   9:40 AM

## 2020-08-28 NOTE — Progress Notes (Signed)
Pt here for D1C13 of rituxan every 60 days. Last treatment 07/03/2020.  New consent obtained. Vital signs and labs stable for treatment. No changes since last treatment.   Pt tolerated treatment without incidence today.  Vital signs stable prior to discharge.  Stable during and after treatment.  AVS reviewed.  Discharged in stable condition ambulatory.

## 2020-08-29 ENCOUNTER — Other Ambulatory Visit: Payer: Self-pay | Admitting: Internal Medicine

## 2020-09-02 ENCOUNTER — Other Ambulatory Visit: Payer: Self-pay | Admitting: Oncology

## 2020-09-12 ENCOUNTER — Telehealth: Payer: Self-pay | Admitting: Oncology

## 2020-09-12 NOTE — Telephone Encounter (Signed)
R/s appt per 5/12 sch msg. Pt aware.  

## 2020-10-10 ENCOUNTER — Other Ambulatory Visit: Payer: Self-pay

## 2020-10-10 ENCOUNTER — Ambulatory Visit (INDEPENDENT_AMBULATORY_CARE_PROVIDER_SITE_OTHER): Payer: Medicare Other | Admitting: Nurse Practitioner

## 2020-10-10 VITALS — BP 159/78 | HR 74 | Temp 97.7°F | Ht 62.0 in | Wt 134.0 lb

## 2020-10-10 DIAGNOSIS — Z01419 Encounter for gynecological examination (general) (routine) without abnormal findings: Secondary | ICD-10-CM | POA: Diagnosis not present

## 2020-10-10 DIAGNOSIS — Z0000 Encounter for general adult medical examination without abnormal findings: Secondary | ICD-10-CM

## 2020-10-10 MED ORDER — ALPRAZOLAM 0.25 MG PO TABS: 0.2500 mg | ORAL_TABLET | Freq: Every day | ORAL | 2 refills | Status: DC | PRN

## 2020-10-10 NOTE — Progress Notes (Addendum)
Subjective:    Patient ID: [name] [name], female    DOB: [birth year], 80 y.o.   MRN: [id number]  HPI  AWV- Annual Wellness Visit  The patient was seen for their annual wellness visit. The patient's past medical history, surgical history, and family history were reviewed. Pertinent vaccines were reviewed ( tetanus, pneumonia, shingles, flu) The patient's medication list was reviewed and updated.  The height and weight were entered.  BMI recorded in electronic record elsewhere  Cognitive screening was completed. Outcome of Mini - Cog: 5   Falls /depression screening electronically recorded within record elsewhere  Current tobacco usage:no (All patients who use tobacco were given written and verbal information on quitting)  Recent listing of emergency department/hospitalizations over the past year were reviewed.  current specialist the patient sees on a regular basis: walking   Medicare annual wellness visit patient questionnaire was reviewed.  A written screening schedule for the patient for the next 5-10 years was given. Appropriate discussion of followup regarding next visit was discussed. Has had a hysterectomy with BSO including bladder tacking. No urinary incontinence. Some urgency at times. History of rectocele. Regular vision and dental care.  Has mammogram scheduled.  Bone density in July with Dr. Amil Amen. No new sexual partners.  Takes an occasional low dose Xanax for anxiety.  BP running well outside of the office.  Is completing therapy for lymphoma.   Review of Systems  Constitutional:  Negative for activity change, appetite change and fatigue.  Respiratory:  Negative for cough, chest tightness, shortness of breath and wheezing.   Cardiovascular:  Negative for chest pain and leg swelling.  Gastrointestinal:  Negative for abdominal distention, abdominal pain, blood in stool, constipation, diarrhea, nausea and vomiting.  Genitourinary:  Positive for urgency.  Negative for difficulty urinating, dysuria, enuresis, frequency, genital sores, pelvic pain and vaginal discharge.      Objective:   Physical Exam Constitutional:      General: She is not in acute distress.    Appearance: She is well-developed.  Neck:     Thyroid: No thyromegaly.     Trachea: No tracheal deviation.     Comments: Thyroid non tender to palpation. No mass or goiter noted.  Cardiovascular:     Rate and Rhythm: Normal rate and regular rhythm.     Heart sounds: Normal heart sounds. No murmur heard. Pulmonary:     Effort: Pulmonary effort is normal.     Breath sounds: Normal breath sounds.  Chest:  Breasts:    Right: No swelling, inverted nipple, mass, skin change, tenderness, axillary adenopathy or supraclavicular adenopathy.     Left: No swelling, inverted nipple, mass, skin change, tenderness, axillary adenopathy or supraclavicular adenopathy.  Abdominal:     General: There is no distension.     Palpations: Abdomen is soft.     Tenderness: There is no abdominal tenderness.  Genitourinary:    Vagina: Normal.     Comments: External GU: pale with atrophy. No lesions or rash. Vagina: pale and dry. Bimanual exam with valsalva:  mild relaxation of anterior vaginal wall. No significant cystocele noted. Minimal rectocele. Mild vaginal relaxation. No pelvic tenderness or obvious masses.  Musculoskeletal:     Cervical back: Normal range of motion and neck supple.  Lymphadenopathy:     Cervical: No cervical adenopathy.     Upper Body:     Right upper body: No supraclavicular, axillary or pectoral adenopathy.     Left upper body: No supraclavicular, axillary or pectoral  adenopathy.  Skin:    General: Skin is warm and dry.     Findings: No rash.  Neurological:     Mental Status: She is alert and oriented to person, place, and time.  Psychiatric:        Mood and Affect: Mood normal.        Behavior: Behavior normal.        Thought Content: Thought content normal.         Judgment: Judgment normal.  Today's Vitals   10/10/20 1052  BP: (!) 159/78  Pulse: 74  Temp: 97.7 F (36.5 C)  SpO2: 98%  Weight: 134 lb (60.8 kg)  Height: 5\' 2"  (1.575 m)   Body mass index is 24.51 kg/m.         Assessment & Plan:  Encounter for subsequent annual wellness visit in Medicare patient  Well woman exam Meds ordered this encounter  Medications   ALPRAZolam (XANAX) 0.25 MG tablet    Sig: Take 1 tablet (0.25 mg total) by mouth daily as needed for anxiety.    Dispense:  30 tablet    Refill:  2  Labs are done through her specialists. Continue activity and healthy habits.  Consider Shingrix and Tdap. Bone density in July. Mammogram scheduled.  Monitor BP outside of the office. Graph of her BP indicates that it usually runs normal.  Return in about 1 year (around 10/10/2021) for physical.

## 2020-10-11 ENCOUNTER — Encounter: Payer: Self-pay | Admitting: Nurse Practitioner

## 2020-10-27 ENCOUNTER — Inpatient Hospital Stay: Payer: Medicare Other | Attending: Oncology

## 2020-10-27 ENCOUNTER — Other Ambulatory Visit: Payer: Self-pay

## 2020-10-27 DIAGNOSIS — Z8572 Personal history of non-Hodgkin lymphomas: Secondary | ICD-10-CM | POA: Insufficient documentation

## 2020-10-27 DIAGNOSIS — K589 Irritable bowel syndrome without diarrhea: Secondary | ICD-10-CM | POA: Diagnosis not present

## 2020-10-27 DIAGNOSIS — M8580 Other specified disorders of bone density and structure, unspecified site: Secondary | ICD-10-CM | POA: Diagnosis not present

## 2020-10-27 DIAGNOSIS — C8203 Follicular lymphoma grade I, intra-abdominal lymph nodes: Secondary | ICD-10-CM

## 2020-10-27 DIAGNOSIS — Z9221 Personal history of antineoplastic chemotherapy: Secondary | ICD-10-CM | POA: Diagnosis not present

## 2020-10-27 LAB — CBC WITH DIFFERENTIAL (CANCER CENTER ONLY)
Abs Immature Granulocytes: 0.02 10*3/uL (ref 0.00–0.07)
Basophils Absolute: 0.1 10*3/uL (ref 0.0–0.1)
Basophils Relative: 1 %
Eosinophils Absolute: 0.1 10*3/uL (ref 0.0–0.5)
Eosinophils Relative: 2 %
HCT: 43.3 % (ref 36.0–46.0)
Hemoglobin: 14.2 g/dL (ref 12.0–15.0)
Immature Granulocytes: 0 %
Lymphocytes Relative: 13 %
Lymphs Abs: 1.2 10*3/uL (ref 0.7–4.0)
MCH: 30.3 pg (ref 26.0–34.0)
MCHC: 32.8 g/dL (ref 30.0–36.0)
MCV: 92.5 fL (ref 80.0–100.0)
Monocytes Absolute: 1.1 10*3/uL — ABNORMAL HIGH (ref 0.1–1.0)
Monocytes Relative: 13 %
Neutro Abs: 6.2 10*3/uL (ref 1.7–7.7)
Neutrophils Relative %: 71 %
Platelet Count: 280 10*3/uL (ref 150–400)
RBC: 4.68 MIL/uL (ref 3.87–5.11)
RDW: 12.7 % (ref 11.5–15.5)
WBC Count: 8.7 10*3/uL (ref 4.0–10.5)
nRBC: 0 % (ref 0.0–0.2)

## 2020-10-27 LAB — BASIC METABOLIC PANEL - CANCER CENTER ONLY
Anion gap: 10 (ref 5–15)
BUN: 15 mg/dL (ref 8–23)
CO2: 26 mmol/L (ref 22–32)
Calcium: 10.6 mg/dL — ABNORMAL HIGH (ref 8.9–10.3)
Chloride: 107 mmol/L (ref 98–111)
Creatinine: 0.79 mg/dL (ref 0.44–1.00)
GFR, Estimated: >60 mL/min (ref >60)
Glucose, Bld: 86 mg/dL (ref 70–99)
Potassium: 4.5 mmol/L (ref 3.5–5.1)
Sodium: 143 mmol/L (ref 135–145)

## 2020-10-27 LAB — LACTATE DEHYDROGENASE: LDH: 192 U/L (ref 98–192)

## 2020-10-28 ENCOUNTER — Other Ambulatory Visit: Payer: Medicare Other

## 2020-10-28 ENCOUNTER — Ambulatory Visit (HOSPITAL_COMMUNITY)
Admission: RE | Admit: 2020-10-28 | Discharge: 2020-10-28 | Disposition: A | Discharge status: Home / Self Care | Payer: Medicare Other | Source: Ambulatory Visit | Attending: Oncology | Admitting: Oncology

## 2020-10-28 DIAGNOSIS — C8203 Follicular lymphoma grade I, intra-abdominal lymph nodes: Secondary | ICD-10-CM | POA: Insufficient documentation

## 2020-10-28 DIAGNOSIS — I700 Atherosclerosis of aorta: Secondary | ICD-10-CM | POA: Diagnosis not present

## 2020-10-28 DIAGNOSIS — K5750 Diverticulosis of both small and large intestine without perforation or abscess without bleeding: Secondary | ICD-10-CM | POA: Diagnosis not present

## 2020-10-28 DIAGNOSIS — C8590 Non-Hodgkin lymphoma, unspecified, unspecified site: Secondary | ICD-10-CM | POA: Diagnosis not present

## 2020-10-28 MED ORDER — IOHEXOL 300 MG/ML  SOLN
100.0000 mL | Freq: Once | INTRAMUSCULAR | Status: AC | PRN
  Administered 2020-10-28: 100 mL via INTRAVENOUS

## 2020-10-28 MED ORDER — IOHEXOL 9 MG/ML PO SOLN
ORAL | Status: AC
  Administered 2020-10-28: 500 mL via ORAL
  Filled 2020-10-28: qty 1000

## 2020-10-28 MED ORDER — IOHEXOL 9 MG/ML PO SOLN
500.0000 mL | ORAL | Status: AC
  Administered 2020-10-28: 500 mL via ORAL

## 2020-10-28 MED ORDER — SODIUM CHLORIDE (PF) 0.9 % IJ SOLN
INTRAMUSCULAR | Status: AC
  Filled 2020-10-28: qty 50

## 2020-10-29 ENCOUNTER — Other Ambulatory Visit: Payer: Self-pay

## 2020-10-29 ENCOUNTER — Inpatient Hospital Stay (HOSPITAL_BASED_OUTPATIENT_CLINIC_OR_DEPARTMENT_OTHER): Payer: Medicare Other | Admitting: Oncology

## 2020-10-29 VITALS — BP 138/69 | HR 62 | Temp 97.8°F | Resp 18 | Ht 62.0 in | Wt 133.6 lb

## 2020-10-29 DIAGNOSIS — Z9221 Personal history of antineoplastic chemotherapy: Secondary | ICD-10-CM | POA: Diagnosis not present

## 2020-10-29 DIAGNOSIS — K589 Irritable bowel syndrome without diarrhea: Secondary | ICD-10-CM | POA: Diagnosis not present

## 2020-10-29 DIAGNOSIS — Z8572 Personal history of non-Hodgkin lymphomas: Secondary | ICD-10-CM | POA: Diagnosis not present

## 2020-10-29 DIAGNOSIS — C8203 Follicular lymphoma grade I, intra-abdominal lymph nodes: Secondary | ICD-10-CM | POA: Diagnosis not present

## 2020-10-29 DIAGNOSIS — M8580 Other specified disorders of bone density and structure, unspecified site: Secondary | ICD-10-CM | POA: Diagnosis not present

## 2020-10-29 NOTE — Progress Notes (Signed)
[facility] OFFICE PROGRESS NOTE   Diagnosis: Non-Hodgkin's lymphoma  INTERVAL HISTORY:   [name] [name] returns as scheduled.  She was last treated with rituximab on 08/28/2020.  She feels well.  No fever, night sweats, abdominal pain, or anorexia.  She is walking and doing her house/yard work.  Objective:  Vital signs in last 24 hours:  Blood pressure 138/69, pulse 62, temperature 97.8 F (36.6 C), temperature source Oral, resp. rate 18, height 5\' 2"  (1.575 m), weight 133 lb 9.6 oz (60.6 kg), SpO2 100 %.     Lymphatics: No cervical, supraclavicular, axillary, or inguinal nodes Resp: Lungs clear bilaterally Cardio: Regular rate and rhythm GI: No mass, nontender, no hepatosplenomegaly Vascular: No leg edema   Lab Results:  Lab Results  Component Value Date   WBC 8.7 10/27/2020   HGB 14.2 10/27/2020   HCT 43.3 10/27/2020   MCV 92.5 10/27/2020   PLT 280 10/27/2020   NEUTROABS 6.2 10/27/2020    CMP  Lab Results  Component Value Date   NA 143 10/27/2020   K 4.5 10/27/2020   CL 107 10/27/2020   CO2 26 10/27/2020   GLUCOSE 86 10/27/2020   BUN 15 10/27/2020   CREATININE 0.79 10/27/2020   CALCIUM 10.6 (H) 10/27/2020   PROT 6.8 08/28/2020   ALBUMIN 4.6 08/28/2020   AST 27 08/28/2020   ALT 26 08/28/2020   ALKPHOS 60 08/28/2020   BILITOT 0.4 08/28/2020   GFRNONAA >60 10/27/2020   GFRAA >60 01/17/2020      Imaging:  CT CHEST W CONTRAST  Result Date: 10/28/2020 CLINICAL DATA:  Non-Hodgkin's lymphoma restaging EXAM: CT CHEST, ABDOMEN, AND PELVIS WITH CONTRAST TECHNIQUE: Multidetector CT imaging of the chest, abdomen and pelvis was performed following the standard protocol during bolus administration of intravenous contrast. CONTRAST:  173mL OMNIPAQUE IOHEXOL 300 MG/ML SOLN, additional oral enteric contrast COMPARISON:  CT abdomen pelvis, 11/20/2019 FINDINGS: CT CHEST FINDINGS Cardiovascular: No significant vascular findings. Normal heart size. No  pericardial effusion. Mediastinum/Nodes: No enlarged mediastinal, hilar, or axillary lymph nodes. Thyroid gland, trachea, and esophagus demonstrate no significant findings. Lungs/Pleura: Lungs are clear. No pleural effusion or pneumothorax. Musculoskeletal: No chest wall mass or suspicious bone lesions identified. CT ABDOMEN PELVIS FINDINGS Hepatobiliary: No focal liver abnormality is seen. Status post cholecystectomy. No biliary dilatation. Pancreas: Unremarkable. No pancreatic ductal dilatation or surrounding inflammatory changes. Spleen: Normal in size without significant abnormality. Adrenals/Urinary Tract: Adrenal glands are unremarkable. Kidneys are normal, without renal calculi, solid lesion, or hydronephrosis. Bladder is unremarkable. Stomach/Bowel: Stomach is within normal limits. Appendix appears normal. No evidence of bowel wall thickening, distention, or inflammatory changes. Sigmoid diverticulosis. Vascular/Lymphatic: Aortic atherosclerosis. No enlarged abdominal or pelvic lymph nodes. Unchanged ill-defined, post treatment appearance of soft tissue in the left hemiabdominal small bowel mesentery (series 2, image 82). Reproductive: Status post hysterectomy. Other: No abdominal wall hernia or abnormality. No abdominopelvic ascites. Musculoskeletal: No acute or significant osseous findings. IMPRESSION: 1. Unchanged ill-defined, post treatment appearance of soft tissue in the left hemiabdominal small bowel mesentery, consistent with treated lymphoma. 2. No evidence of lymphadenopathy or other evidence of recurrent lymphoma in the chest, abdomen, or pelvis. 3. Status post hysterectomy and cholecystectomy. 4. Sigmoid diverticulosis. Aortic Atherosclerosis (ICD10-I70.0). Electronically Signed   By: Eddie Candle M.D.   On: 10/28/2020 15:45   CT ABDOMEN PELVIS W CONTRAST  Result Date: 10/28/2020 CLINICAL DATA:  Non-Hodgkin's lymphoma restaging EXAM: CT CHEST, ABDOMEN, AND PELVIS WITH CONTRAST TECHNIQUE:  Multidetector CT imaging of the  chest, abdomen and pelvis was performed following the standard protocol during bolus administration of intravenous contrast. CONTRAST:  158mL OMNIPAQUE IOHEXOL 300 MG/ML SOLN, additional oral enteric contrast COMPARISON:  CT abdomen pelvis, 11/20/2019 FINDINGS: CT CHEST FINDINGS Cardiovascular: No significant vascular findings. Normal heart size. No pericardial effusion. Mediastinum/Nodes: No enlarged mediastinal, hilar, or axillary lymph nodes. Thyroid gland, trachea, and esophagus demonstrate no significant findings. Lungs/Pleura: Lungs are clear. No pleural effusion or pneumothorax. Musculoskeletal: No chest wall mass or suspicious bone lesions identified. CT ABDOMEN PELVIS FINDINGS Hepatobiliary: No focal liver abnormality is seen. Status post cholecystectomy. No biliary dilatation. Pancreas: Unremarkable. No pancreatic ductal dilatation or surrounding inflammatory changes. Spleen: Normal in size without significant abnormality. Adrenals/Urinary Tract: Adrenal glands are unremarkable. Kidneys are normal, without renal calculi, solid lesion, or hydronephrosis. Bladder is unremarkable. Stomach/Bowel: Stomach is within normal limits. Appendix appears normal. No evidence of bowel wall thickening, distention, or inflammatory changes. Sigmoid diverticulosis. Vascular/Lymphatic: Aortic atherosclerosis. No enlarged abdominal or pelvic lymph nodes. Unchanged ill-defined, post treatment appearance of soft tissue in the left hemiabdominal small bowel mesentery (series 2, image 82). Reproductive: Status post hysterectomy. Other: No abdominal wall hernia or abnormality. No abdominopelvic ascites. Musculoskeletal: No acute or significant osseous findings. IMPRESSION: 1. Unchanged ill-defined, post treatment appearance of soft tissue in the left hemiabdominal small bowel mesentery, consistent with treated lymphoma. 2. No evidence of lymphadenopathy or other evidence of recurrent lymphoma in the  chest, abdomen, or pelvis. 3. Status post hysterectomy and cholecystectomy. 4. Sigmoid diverticulosis. Aortic Atherosclerosis (ICD10-I70.0). Electronically Signed   By: Eddie Candle M.D.   On: 10/28/2020 15:45    Medications: I have reviewed the patient's current medications.   Assessment/Plan:  Non-Hodgkin's lymphoma-follicular lymphoma Mesenteric lymph node biopsy 9/48/5462- follicular center cell lymphoma, grade 2 Treated with mitoxantrone, fludarabine, and rituximab completed in January 2002 Progressive disease in February 2004, treated with single agent rituximab February 2004 through August 2005 CTs 06/21/2014- no evidence of lymphoma CT abdomen/pelvis 06/05/2018- enlarging left mid abdominal mesenteric mass CT biopsy of the mesenteric mass 06/16/2018-follicular lymphoma, low-grade, CD20 positive Cycle 1 weekly Rituxan 07/24/2018 Cycle 2 weekly Rituxan 07/31/2018  Cycle 3 weekly Rituxan 08/07/2018 Cycle 4 weekly Rituxan 08/14/2018 CT abdomen/pelvis 10/09/2018- reduction in size of left lower quadrant mesenteric mass Cycle 1 maintenance rituximab 10/19/2018 Cycle 2 maintenance Rituxan 12/14/2018 Cycle 3 maintenance Rituxan 02/08/2019 Cycle 4 maintenance Rituxan 04/09/2019 CT abdomen/pelvis 05/31/2019-no change in jejunal mesenteric mass, no evidence of disease progression. Cycle 5 maintenance rituximab 06/07/2019 Cycle 6 maintenance rituximab 08/02/2019 Cycle 7 maintenance rituximab 09/27/2019 CT abdomen/pelvis 11/20/2019-further contraction of the lesion at the small bowel mesentery, no evidence of progressive lymphoma Cycle 8 maintenance rituximab 11/22/2019 Cycle 9 maintenance rituximab 01/17/2020 Cycle 10 maintenance rituximab 03/13/2020 Cycle 11 maintenance rituximab 05/08/2020 Cycle 12 maintenance rituximab 07/03/2020 Cycle 13 maintenance rituximab 08/28/2020 CTs 10/28/2020-unchanged soft tissue in the left abdominal small bowel mesentery, no evidence of lymphadenopathy or recurrent disease elsewhere  in the chest, abdomen, or pelvis Irritable bowel syndrome History of anal incontinence Osteopenia History of colon polyps Weakness, flushing, presyncope and chills during Rituxan infusion 07/24/2018. Mild hypercalcemia-chronic      Disposition: Ms. Bedwell appears stable.  She is in clinical remission from [place].  We reviewed the CT images.  I suspect the soft tissue in the left abdominal mesentery is related to treated lymphoma.  She will return for an office and lab visit in 6 months.  She will call in the interim for new symptoms.  Betsy Coder, MD  10/29/2020  10:09 AM

## 2020-10-30 ENCOUNTER — Other Ambulatory Visit (HOSPITAL_COMMUNITY): Payer: Self-pay | Admitting: Internal Medicine

## 2020-10-30 DIAGNOSIS — G245 Blepharospasm: Secondary | ICD-10-CM | POA: Diagnosis not present

## 2020-10-30 DIAGNOSIS — M810 Age-related osteoporosis without current pathological fracture: Secondary | ICD-10-CM

## 2020-11-09 NOTE — Progress Notes (Deleted)
Cardiology Office Note:    Date:  11/09/2020   ID:  [name] [name], DOB 1940-12-12, MRN [id number]  PCP:  Kathyrn Drown, MD   [facility] Providers Cardiologist:  Ena Dawley, MD (Inactive) {   Referring MD: Erven Colla, DO    History of Present Illness:    [name] [name] is a 80 y.o. female with a hx of MVP, NSVT, HLD, HTN, and non Hodgkin's lymphoma on chronic Rituximab who was previously followed by Dr. Meda Coffee who now returns to clinic for follow-up.  Cath 2015 with normal coronary arteries. Holter with NSVT on atenolol. Cardiac MRI normal.   Last saw Truitt Merle on 05/29/20 where she was doing well.   Today,  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   Manometry 12/2015   Arthritis    Brady-tachy syndrome ([place])    Chest pain 10/16/13   normal coronary arteries on cath and normal EF   Colitis, ischemic ([place])    Colon polyp 07/29/1993   with focal adenomatous changes   Diverticulitis    Diverticulosis of colon    Dyspepsia    Dyssynergic defecation 12/31/2015   Anorectal mano 12/2015, also has rectal hypersensitivity   Fatty liver    GERD (gastroesophageal reflux disease)    Heart murmur    HLD (hyperlipidemia)    Hypertension    IBS (irritable bowel syndrome)    Incontinence, feces    MVP (mitral valve prolapse)    nhl dx'd 07/1999/ 06/2018   chemo comp 2001; rituxin comp 2005   Osteopenia    Palpitation    Rectocele    Rectocele    Vasovagal syncope     Past Surgical History:  Procedure Laterality Date   ABDOMINAL HYSTERECTOMY     ABDOMINAL SURGERY  2001    for non hodgkins lymphoma; lymphoma in [place] N/A 12/24/2015   Procedure: ANO RECTAL MANOMETRY;  Surgeon: Gatha Mayer, MD;  Location: [facility];  Service: Endoscopy;  Laterality: N/A;   BLADDER SUSPENSION     BUNIONECTOMY     CHOLECYSTECTOMY  2003   COLONOSCOPY  7/200/, 11/2007   diverticulosis   FINE NEEDLE  ASPIRATION BIOPSY Left 06/16/2018   left mesentery tissue   LEFT HEART CATHETERIZATION WITH CORONARY ANGIOGRAM N/A 10/16/2013   Procedure: LEFT HEART CATHETERIZATION WITH CORONARY ANGIOGRAM;  Surgeon: Peter M Martinique, MD;  Location: [facility] CATH LAB;  Service: Cardiovascular;  Laterality: N/A;   pelvic prolapse repair     UPPER GASTROINTESTINAL ENDOSCOPY  10/2005   hiatal hernia    Current Medications: No outpatient medications have been marked as taking for the 11/13/20 encounter (Appointment) with Freada Bergeron, MD.     Allergies:   Other, Sulfa antibiotics, Sulfonamide derivatives, Glycopyrrolate, Latex, Methscopolamine, Ace inhibitors, Codeine, Iodine, and Iohexol   Social History   Socioeconomic History   Marital status: Widowed    Spouse name: Not on file   Number of children: 1   Years of education: Not on file   Highest education level: Not on file  Occupational History   Occupation: Medical sales representative work part time    Employer: RETIRED  Tobacco Use   Smoking status: Former    Packs/day: 0.33    Pack years: 0.00    Types: Cigarettes   Smokeless tobacco: Never   Tobacco comments:    quit 1976  Vaping Use   Vaping Use: Never used  Substance and Sexual Activity  Alcohol use: Yes    Alcohol/week: 0.0 standard drinks    Comment: very rare   Drug use: No   Sexual activity: Yes    Birth control/protection: Surgical  Other Topics Concern   Not on file  Social History Narrative   Retired, widowed, 1 child   Rare EtOH, former smoker none now   No drugs   Right handed   Social Determinants of Health   Financial Resource Strain: Not on file  Food Insecurity: Not on file  Transportation Needs: Not on file  Physical Activity: Not on file  Stress: Not on file  Social Connections: Not on file     Family History: The patient's ***family history includes Colon cancer (age of onset: 75) in her father; Colon polyps in her brother and brother; Prostate cancer in her brother;  Stomach cancer in her maternal grandfather and maternal uncle. There is no history of Esophageal cancer, Pancreatic cancer, Liver disease, or Breast cancer.  ROS:   Please see the history of present illness.    *** All other systems reviewed and are negative.  EKGs/Labs/Other Studies Reviewed:    The following studies were reviewed today: TTE 11-12-2018 IMPRESSIONS   1. The left ventricle has normal systolic function, with an ejection  fraction of 55-60%. The cavity size was normal. Left ventricular diastolic  Doppler parameters are consistent with impaired relaxation.   2. The right ventricle has normal systolic function. The cavity was  normal. There is no increase in right ventricular wall thickness.   3. Borderline bileaflet prolapse of the mitral valve leaflets with  trivial central mitral regurgitation.   4. The aortic valve is tricuspid. Mild sclerosis of the aortic valve. No  stenosis of the aortic valve.   5. The aortic root and ascending aorta are normal in size and structure.  30-day monitor 03/06/2018 sinus bradycardia to sinus tachycardia with 3 episodes of NSVT: 2 5 beats and one 6 beat   Cardiac MRI 03/2018 IMPRESSION: 1. Normal left ventricular size, thickness and systolic function (LVEF =38%). There is paradoxical septal motion. There is no late gadolinium enhancement in the left ventricular myocardium.   2. Normal right ventricular size, thickness and systolic function (LVEF = 63%). There are no regional wall motion abnormalities.   3.  Normal left and right atrial size.   4. Normal size of the aortic root, ascending aorta and pulmonary artery.   5. Anterior mitral valve leaflet prolapse with mitral regurgitation. Mild tricuspid regurgitation.   6.  Normal pericardium.  No pericardial effusion.   Normal cardiac MRI without evidence for inflammatory cardiomyopathy or ARVC.    Electronically Signed   By: Ena Dawley   On: 03/28/2018 19:07      CARDIAC CATH Final Conclusions 10/2013:   1. Normal coronary anatomy 2. Normal LV function   Recommendations: medical therapy.  EKG:  EKG is *** ordered today.  The ekg ordered today demonstrates ***  Recent Labs: 08/28/2020: ALT 26 10/27/2020: BUN 15; Creatinine 0.79; Hemoglobin 14.2; Platelet Count 280; Potassium 4.5; Sodium 143  Recent Lipid Panel    Component Value Date/Time   CHOL 135 07/23/2020 0757   TRIG 52 07/23/2020 0757   HDL 60 07/23/2020 0757   CHOLHDL 2.3 07/23/2020 0757   CHOLHDL 4.6 02/06/2013 0806   VLDL 33 02/06/2013 0806   LDLCALC 63 07/23/2020 0757     Risk Assessment/Calculations:   {Does this patient have ATRIAL FIBRILLATION?:[id number]}       Physical Exam:  VS:  There were no vitals taken for this visit.    Wt Readings from Last 3 Encounters:  10/29/20 133 lb 9.6 oz (60.6 kg)  10/10/20 134 lb (60.8 kg)  08/28/20 132 lb 9.6 oz (60.1 kg)     GEN: *** Well nourished, well developed in no acute distress HEENT: Normal NECK: No JVD; No carotid bruits LYMPHATICS: No lymphadenopathy CARDIAC: ***RRR, no murmurs, rubs, gallops RESPIRATORY:  Clear to auscultation without rales, wheezing or rhonchi  ABDOMEN: Soft, non-tender, non-distended MUSCULOSKELETAL:  No edema; No deformity  SKIN: Warm and dry NEUROLOGIC:  Alert and oriented x 3 PSYCHIATRIC:  Normal affect   ASSESSMENT:    No diagnosis found. PLAN:    In order of problems listed above:  #Palpitations: #History of NSVT: Normal cath in 2015 and cardiac MRI 2019 normal. -Continue atenolol 37.5mg  daily  #MVP: Mild MR on TTE in 2020. -Repeat TTE in 2023  #HTN: -Continue atenolol 37.5mg  daily  #HLD: -Crestor 5mg  daily   #Non Hodgkins Lymphoma: Recurrent. On chronic rituximab. -Management per Onc   {Are you ordering a CV Procedure (e.g. stress test, cath, DCCV, TEE, etc)?   Press F2        :[id number]}    Medication Adjustments/Labs and Tests Ordered: Current medicines are  reviewed at length with the patient today.  Concerns regarding medicines are outlined above.  No orders of the defined types were placed in this encounter.  No orders of the defined types were placed in this encounter.   There are no Patient Instructions on file for this visit.   Signed, Freada Bergeron, MD  11/09/2020 8:48 PM    [place]

## 2020-11-12 ENCOUNTER — Other Ambulatory Visit: Payer: Self-pay | Admitting: *Deleted

## 2020-11-12 MED ORDER — ATENOLOL 25 MG PO TABS: ORAL_TABLET | ORAL | 1 refills | Status: DC

## 2020-11-12 NOTE — Progress Notes (Signed)
Cardiology Office Note:    Date:  11/13/2020   ID:  [name] [name], DOB 16-Jan-1941, MRN [id number]  PCP:  Kathyrn Drown, MD   [facility] Providers Cardiologist:  Ena Dawley, MD (Inactive) {   Referring MD: Erven Colla, DO    History of Present Illness:    [name] [name] is a 80 y.o. female with a hx of MVP, NSVT, HLD, HTN, and non Hodgkin's lymphoma on chronic Rituximab who was previously followed by Dr. Meda Coffee who now returns to clinic for follow-up.  Cath 2015 with normal coronary arteries. Holter with NSVT on atenolol. Cardiac MRI normal.   Last saw Truitt Merle on 05/29/20 where she was doing well.   Today, patient is feeling okay overall. She has LE Edema and pain on both legs however her left leg swells more than right leg. She also has neuropathy in her left leg. She says it feels like a burning sensation that goes all the way down to her ankle. Of note, when she elevates her leg or switch position she relieves the pain. No chest pain or SOB. She has remained active and walks 3 miles per day. Notes occasional palpitations but overall these are improved on the atenolol.   Patient denies chest pain, abdominal pain, diarrhea, palpitations, nausea, headaches, lightheadedness, syncope,orthopnea or PND.  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   Manometry 12/2015   Arthritis    Brady-tachy syndrome ([place])    Chest pain 10/16/13   normal coronary arteries on cath and normal EF   Colitis, ischemic ([place])    Colon polyp 07/29/1993   with focal adenomatous changes   Diverticulitis    Diverticulosis of colon    Dyspepsia    Dyssynergic defecation 12/31/2015   Anorectal mano 12/2015, also has rectal hypersensitivity   Fatty liver    GERD (gastroesophageal reflux disease)    Heart murmur    HLD (hyperlipidemia)    Hypertension    IBS (irritable bowel syndrome)    Incontinence, feces    MVP (mitral valve prolapse)    nhl  dx'd 07/1999/ 06/2018   chemo comp 2001; rituxin comp 2005   Osteopenia    Palpitation    Rectocele    Rectocele    Vasovagal syncope     Past Surgical History:  Procedure Laterality Date   ABDOMINAL HYSTERECTOMY     ABDOMINAL SURGERY  2001    for non hodgkins lymphoma; lymphoma in [place] N/A 12/24/2015   Procedure: ANO RECTAL MANOMETRY;  Surgeon: Gatha Mayer, MD;  Location: [facility];  Service: Endoscopy;  Laterality: N/A;   BLADDER SUSPENSION     BUNIONECTOMY     CHOLECYSTECTOMY  2003   COLONOSCOPY  7/200/, 11/2007   diverticulosis   FINE NEEDLE ASPIRATION BIOPSY Left 06/16/2018   left mesentery tissue   LEFT HEART CATHETERIZATION WITH CORONARY ANGIOGRAM N/A 10/16/2013   Procedure: LEFT HEART CATHETERIZATION WITH CORONARY ANGIOGRAM;  Surgeon: Peter M Martinique, MD;  Location: [facility] C[facility] LAB;  Service: Cardiovascular;  Laterality: N/A;   pelvic prolapse repair     UPPER GASTROINTESTINAL ENDOSCOPY  10/2005   hiatal hernia    Current Medications: Current Meds  Medication Sig   ALPRAZolam (XANAX) 0.25 MG tablet Take 1 tablet (0.25 mg total) by mouth daily as needed for anxiety.   atenolol (TENORMIN) 25 MG tablet TAKE 1.5 TABLETS (37.5MG  TOTAL) BY MOUTHDAILY.   fexofenadine (ALLEGRA) 180 MG  tablet Take 180 mg by mouth daily as needed for allergies.   pantoprazole (PROTONIX) 40 MG tablet Take 1 tablet (40 mg total) by mouth daily before breakfast. Office visit for further refills   Polyvinyl Alcohol-Povidone (REFRESH OP) Apply 1 drop to eye 3 (three) times daily.   Probiotic Product (ALIGN PO) Take 1 capsule by mouth daily. Alternates between Sunrise per PCP orders every few months   rosuvastatin (CRESTOR) 5 MG tablet Mon, Wed and Fri only.   TURMERIC PO Take 1 capsule by mouth daily.    UNABLE TO FIND Apply topically.   Wheat Dextrin (BENEFIBER) POWD Take 1 Dose by mouth daily.     Allergies:   Other, Sulfa antibiotics, Sulfonamide  derivatives, Glycopyrrolate, Latex, Methscopolamine, Ace inhibitors, Codeine, Iodine, and Iohexol   Social History   Socioeconomic History   Marital status: Widowed    Spouse name: Not on file   Number of children: 1   Years of education: Not on file   Highest education level: Not on file  Occupational History   Occupation: Medical sales representative work part time    Employer: RETIRED  Tobacco Use   Smoking status: Former    Packs/day: 0.33    Types: Cigarettes   Smokeless tobacco: Never   Tobacco comments:    quit 1976  Vaping Use   Vaping Use: Never used  Substance and Sexual Activity   Alcohol use: Yes    Alcohol/week: 0.0 standard drinks    Comment: very rare   Drug use: No   Sexual activity: Yes    Birth control/protection: Surgical  Other Topics Concern   Not on file  Social History Narrative   Retired, widowed, 1 child   Rare EtOH, former smoker none now   No drugs   Right handed   Social Determinants of Health   Financial Resource Strain: Not on file  Food Insecurity: Not on file  Transportation Needs: Not on file  Physical Activity: Not on file  Stress: Not on file  Social Connections: Not on file     Family History: The patient's family history includes Colon cancer (age of onset: 48) in her father; Colon polyps in her brother and brother; Prostate cancer in her brother; Stomach cancer in her maternal grandfather and maternal uncle. There is no history of Esophageal cancer, Pancreatic cancer, Liver disease, or Breast cancer.  ROS:   Review of Systems  Constitutional:  Negative for diaphoresis, fever and malaise/fatigue.  HENT:  Negative for congestion, ear pain and hearing loss.   Eyes:  Negative for blurred vision and pain.  Respiratory:  Positive for shortness of breath. Negative for cough and wheezing.   Cardiovascular:  Positive for leg swelling. Negative for chest pain, palpitations, orthopnea, claudication and PND.  Gastrointestinal:  Negative for abdominal  pain, blood in stool, constipation and diarrhea.  Genitourinary:  Negative for dysuria and hematuria.  Musculoskeletal:  Negative for back pain, joint pain, myalgias and neck pain.  Neurological:  Negative for dizziness, tremors and headaches.  Psychiatric/Behavioral:  Negative for depression. The patient does not have insomnia.     EKGs/Labs/Other Studies Reviewed:    The following studies were reviewed today: TTE 11/05/2018 IMPRESSIONS   1. The left ventricle has normal systolic function, with an ejection  fraction of 55-60%. The cavity size was normal. Left ventricular diastolic  Doppler parameters are consistent with impaired relaxation.   2. The right ventricle has normal systolic function. The cavity was  normal. There is  no increase in right ventricular wall thickness.   3. Borderline bileaflet prolapse of the mitral valve leaflets with  trivial central mitral regurgitation.   4. The aortic valve is tricuspid. Mild sclerosis of the aortic valve. No  stenosis of the aortic valve.   5. The aortic root and ascending aorta are normal in size and structure.  30-day monitor 03/06/2018 sinus bradycardia to sinus tachycardia with 3 episodes of NSVT: 2 5 beats and one 6 beat   Cardiac MRI 03/2018 IMPRESSION: 1. Normal left ventricular size, thickness and systolic function (LVEF =57%). There is paradoxical septal motion. There is no late gadolinium enhancement in the left ventricular myocardium.   2. Normal right ventricular size, thickness and systolic function (LVEF = 63%). There are no regional wall motion abnormalities.   3.  Normal left and right atrial size.   4. Normal size of the aortic root, ascending aorta and pulmonary artery.   5. Anterior mitral valve leaflet prolapse with mitral regurgitation. Mild tricuspid regurgitation.   6.  Normal pericardium.  No pericardial effusion.   Normal cardiac MRI without evidence for inflammatory cardiomyopathy or ARVC.     Electronically Signed   By: Ena Dawley   On: 03/28/2018 19:07     CARDIAC CATH Final Conclusions 10/2013:   1. Normal coronary anatomy 2. Normal LV function   Recommendations: medical therapy.  EKG:   11/13/2020: no ekg ordered today.  Recent Labs: 08/28/2020: ALT 26 10/27/2020: BUN 15; Creatinine 0.79; Hemoglobin 14.2; Platelet Count 280; Potassium 4.5; Sodium 143  Recent Lipid Panel    Component Value Date/Time   CHOL 135 07/23/2020 0757   TRIG 52 07/23/2020 0757   HDL 60 07/23/2020 0757   CHOLHDL 2.3 07/23/2020 0757   CHOLHDL 4.6 02/06/2013 0806   VLDL 33 02/06/2013 0806   LDLCALC 63 07/23/2020 0757     Risk Assessment/Calculations:           Physical Exam:    VS:  BP 130/68   Pulse (!) 56   Ht 5\' 2"  (1.575 m)   Wt 134 lb 6.4 oz (61 kg)   SpO2 98%   BMI 24.58 kg/m     Wt Readings from Last 3 Encounters:  11/13/20 134 lb 6.4 oz (61 kg)  10/29/20 133 lb 9.6 oz (60.6 kg)  10/10/20 134 lb (60.8 kg)     GEN:  Well nourished, well developed in no acute distress HEENT: Normal NECK: No JVD; No carotid bruits LYMPHATICS: No lymphadenopathy CARDIAC: RRR, no murmurs, rubs, gallops RESPIRATORY:  Clear to auscultation without rales, wheezing or rhonchi  ABDOMEN: Soft, non-tender, non-distended MUSCULOSKELETAL:  No edema; No deformity  SKIN: Warm and dry NEUROLOGIC:  Alert and oriented x 3 PSYCHIATRIC:  Normal affect   ASSESSMENT:    1. Palpitations   2. MVP (mitral valve prolapse)   3. Bilateral lower extremity edema   4. Essential hypertension   5. NSVT (nonsustained ventricular tachycardia) (HCC)   6. Mixed hyperlipidemia    PLAN:    In order of problems listed above:  #Palpitations: #History of NSVT: Normal cath in 2015 and cardiac MRI 2019 normal. Overall well controlled on the atenolol -Continue atenolol 37.5mg  daily  #LLE swelling and pain: Sounds atypical in nature, however, it is concerning that she has asymmetric swelling. Given  underlying malignancy, will rule-out DVT. -Check LE doppler  #MVP: Mild MR on TTE in 2020. No HF symptoms. Patient would like to continue to be screened every 2 years.  -  Repeat TTE for monitoring  #HTN: Well controlled. No orthostatic symptoms -Continue atenolol 37.5mg  daily  #HLD: Well controlled with LDL 63. -Crestor 5mg  M, W, F   #Non Hodgkins Lymphoma: Recurrent. On chronic rituximab. -Management per Onc      Follow up in 6 months.  Medication Adjustments/Labs and Tests Ordered: Current medicines are reviewed at length with the patient today.  Concerns regarding medicines are outlined above.  Orders Placed This Encounter  Procedures   ECHOCARDIOGRAM COMPLETE   VAS [place] LOWER EXTREMITY VENOUS (DVT)   No orders of the defined types were placed in this encounter.   Patient Instructions  Medication Instructions:   Your physician recommends that you continue on your current medications as directed. Please refer to the Current Medication list given to you today.  *If you need a refill on your cardiac medications before your next appointment, please call your pharmacy*   Testing/Procedures:  Your physician has requested that you have an echocardiogram. Echocardiography is a painless test that uses sound waves to create images of your heart. It provides your doctor with information about the size and shape of your heart and how well your heart's chambers and valves are working. This procedure takes approximately one hour. There are no restrictions for this procedure.  Your physician has requested that you have a lower extremity venous duplex. This test is an ultrasound of the veins in the legs . It looks at venous blood flow that carries blood from the heart to the legs or arms. Allow one hour for a Lower Venous exam. Allow thirty minutes for an Upper Venous exam. There are no restrictions or special instructions.   Follow-Up: At [facility], you and your health needs  are our priority.  As part of our continuing mission to provide you with exceptional heart care, we have created designated Provider Care Teams.  These Care Teams include your primary Cardiologist (physician) and Advanced Practice Providers (APPs -  Physician Assistants and Nurse Practitioners) who all work together to provide you with the care you need, when you need it.  We recommend signing up for the patient portal called "MyChart".  Sign up information is provided on this After Visit Summary.  MyChart is used to connect with patients for Virtual Visits (Telemedicine).  Patients are able to view lab/test results, encounter notes, upcoming appointments, etc.  Non-urgent messages can be sent to your provider as well.   To learn more about what you can do with MyChart, go to [email or web address].    Your next appointment:   6 month(s)  The format for your next appointment:   In Person  Provider:   You may see DR. Carolena Fairbank or one of the following Advanced Practice Providers on your designated Care Team:   Richardson Dopp, PA-C [place], PA-C      I,Jada Bradford,acting as a scribe for Freada Bergeron, MD.,have documented all relevant documentation on the behalf of Freada Bergeron, MD,as directed by  Freada Bergeron, MD while in the presence of Freada Bergeron, MD.  I, Freada Bergeron, MD, have reviewed all documentation for this visit. The documentation on 11/13/20 for the exam, diagnosis, procedures, and orders are all accurate and complete.  Signed, Freada Bergeron, MD  11/13/2020 11:47 AM    [place]

## 2020-11-13 ENCOUNTER — Ambulatory Visit (INDEPENDENT_AMBULATORY_CARE_PROVIDER_SITE_OTHER): Payer: Medicare Other | Admitting: Cardiology

## 2020-11-13 ENCOUNTER — Other Ambulatory Visit: Payer: Self-pay

## 2020-11-13 ENCOUNTER — Encounter: Payer: Self-pay | Admitting: Cardiology

## 2020-11-13 VITALS — BP 130/68 | HR 56 | Ht 62.0 in | Wt 134.4 lb

## 2020-11-13 DIAGNOSIS — E782 Mixed hyperlipidemia: Secondary | ICD-10-CM

## 2020-11-13 DIAGNOSIS — I472 Ventricular tachycardia: Secondary | ICD-10-CM | POA: Diagnosis not present

## 2020-11-13 DIAGNOSIS — R002 Palpitations: Secondary | ICD-10-CM | POA: Diagnosis not present

## 2020-11-13 DIAGNOSIS — R600 Localized edema: Secondary | ICD-10-CM | POA: Diagnosis not present

## 2020-11-13 DIAGNOSIS — I4729 Other ventricular tachycardia: Secondary | ICD-10-CM

## 2020-11-13 DIAGNOSIS — I341 Nonrheumatic mitral (valve) prolapse: Secondary | ICD-10-CM | POA: Diagnosis not present

## 2020-11-13 DIAGNOSIS — I10 Essential (primary) hypertension: Secondary | ICD-10-CM | POA: Diagnosis not present

## 2020-11-13 NOTE — Patient Instructions (Addendum)
Medication Instructions:   Your physician recommends that you continue on your current medications as directed. Please refer to the Current Medication list given to you today.  *If you need a refill on your cardiac medications before your next appointment, please call your pharmacy*   Testing/Procedures:  Your physician has requested that you have an echocardiogram. Echocardiography is a painless test that uses sound waves to create images of your heart. It provides your doctor with information about the size and shape of your heart and how well your heart's chambers and valves are working. This procedure takes approximately one hour. There are no restrictions for this procedure.  Your physician has requested that you have a lower extremity venous duplex. This test is an ultrasound of the veins in the legs . It looks at venous blood flow that carries blood from the heart to the legs or arms. Allow one hour for a Lower Venous exam. Allow thirty minutes for an Upper Venous exam. There are no restrictions or special instructions.   Follow-Up: At [facility], you and your health needs are our priority.  As part of our continuing mission to provide you with exceptional heart care, we have created designated Provider Care Teams.  These Care Teams include your primary Cardiologist (physician) and Advanced Practice Providers (APPs -  Physician Assistants and Nurse Practitioners) who all work together to provide you with the care you need, when you need it.  We recommend signing up for the patient portal called "MyChart".  Sign up information is provided on this After Visit Summary.  MyChart is used to connect with patients for Virtual Visits (Telemedicine).  Patients are able to view lab/test results, encounter notes, upcoming appointments, etc.  Non-urgent messages can be sent to your provider as well.   To learn more about what you can do with MyChart, go to [email or web address].    Your next  appointment:   6 month(s)  The format for your next appointment:   In Person  Provider:   You may see DR. PEMBERTON or one of the following Advanced Practice Providers on your designated Care Team:   Richardson Dopp, PA-C [place], [place]

## 2020-11-17 ENCOUNTER — Ambulatory Visit (HOSPITAL_COMMUNITY)
Admission: RE | Admit: 2020-11-17 | Discharge: 2020-11-17 | Disposition: A | Discharge status: Home / Self Care | Payer: Medicare Other | Source: Ambulatory Visit | Attending: Cardiovascular Disease | Admitting: Cardiovascular Disease

## 2020-11-17 ENCOUNTER — Other Ambulatory Visit: Payer: Self-pay

## 2020-11-17 DIAGNOSIS — R600 Localized edema: Secondary | ICD-10-CM | POA: Diagnosis not present

## 2020-11-25 ENCOUNTER — Ambulatory Visit: Payer: Medicare Other | Admitting: Cardiology

## 2020-12-01 ENCOUNTER — Other Ambulatory Visit: Payer: Self-pay

## 2020-12-01 ENCOUNTER — Ambulatory Visit (HOSPITAL_COMMUNITY)
Admission: RE | Admit: 2020-12-01 | Discharge: 2020-12-01 | Disposition: A | Discharge status: Home / Self Care | Payer: Medicare Other | Source: Ambulatory Visit | Attending: Internal Medicine | Admitting: Internal Medicine

## 2020-12-01 DIAGNOSIS — M810 Age-related osteoporosis without current pathological fracture: Secondary | ICD-10-CM | POA: Diagnosis not present

## 2020-12-04 ENCOUNTER — Ambulatory Visit (HOSPITAL_COMMUNITY): Payer: Medicare Other | Attending: Cardiovascular Disease

## 2020-12-04 ENCOUNTER — Other Ambulatory Visit: Payer: Self-pay

## 2020-12-04 ENCOUNTER — Other Ambulatory Visit: Payer: Self-pay | Admitting: Internal Medicine

## 2020-12-04 DIAGNOSIS — I341 Nonrheumatic mitral (valve) prolapse: Secondary | ICD-10-CM | POA: Diagnosis not present

## 2020-12-04 LAB — ECHOCARDIOGRAM COMPLETE
Area-P 1/2: 2.17 cm2
S' Lateral: 2.7 cm

## 2020-12-05 DIAGNOSIS — U071 COVID-19: Secondary | ICD-10-CM | POA: Diagnosis not present

## 2020-12-22 DIAGNOSIS — M545 Low back pain, unspecified: Secondary | ICD-10-CM | POA: Diagnosis not present

## 2020-12-22 DIAGNOSIS — M5136 Other intervertebral disc degeneration, lumbar region: Secondary | ICD-10-CM | POA: Diagnosis not present

## 2020-12-23 DIAGNOSIS — Z79899 Other long term (current) drug therapy: Secondary | ICD-10-CM | POA: Diagnosis not present

## 2020-12-23 DIAGNOSIS — H268 Other specified cataract: Secondary | ICD-10-CM | POA: Diagnosis not present

## 2020-12-23 DIAGNOSIS — H2513 Age-related nuclear cataract, bilateral: Secondary | ICD-10-CM | POA: Diagnosis not present

## 2020-12-23 DIAGNOSIS — H35033 Hypertensive retinopathy, bilateral: Secondary | ICD-10-CM | POA: Diagnosis not present

## 2020-12-23 DIAGNOSIS — H43392 Other vitreous opacities, left eye: Secondary | ICD-10-CM | POA: Diagnosis not present

## 2020-12-23 DIAGNOSIS — H35342 Macular cyst, hole, or pseudohole, left eye: Secondary | ICD-10-CM | POA: Diagnosis not present

## 2020-12-23 DIAGNOSIS — H43813 Vitreous degeneration, bilateral: Secondary | ICD-10-CM | POA: Diagnosis not present

## 2020-12-23 DIAGNOSIS — H469 Unspecified optic neuritis: Secondary | ICD-10-CM | POA: Diagnosis not present

## 2020-12-30 DIAGNOSIS — M150 Primary generalized (osteo)arthritis: Secondary | ICD-10-CM | POA: Diagnosis not present

## 2020-12-30 DIAGNOSIS — M79642 Pain in left hand: Secondary | ICD-10-CM | POA: Diagnosis not present

## 2020-12-30 DIAGNOSIS — M810 Age-related osteoporosis without current pathological fracture: Secondary | ICD-10-CM | POA: Diagnosis not present

## 2020-12-30 DIAGNOSIS — M79674 Pain in right toe(s): Secondary | ICD-10-CM | POA: Diagnosis not present

## 2020-12-30 DIAGNOSIS — E559 Vitamin D deficiency, unspecified: Secondary | ICD-10-CM | POA: Diagnosis not present

## 2020-12-30 DIAGNOSIS — Z6823 Body mass index (BMI) 23.0-23.9, adult: Secondary | ICD-10-CM | POA: Diagnosis not present

## 2020-12-30 DIAGNOSIS — M2550 Pain in unspecified joint: Secondary | ICD-10-CM | POA: Diagnosis not present

## 2021-01-31 DIAGNOSIS — M5416 Radiculopathy, lumbar region: Secondary | ICD-10-CM | POA: Diagnosis not present

## 2021-01-31 DIAGNOSIS — M5459 Other low back pain: Secondary | ICD-10-CM | POA: Diagnosis not present

## 2021-02-03 ENCOUNTER — Other Ambulatory Visit: Payer: Self-pay

## 2021-02-03 ENCOUNTER — Other Ambulatory Visit (INDEPENDENT_AMBULATORY_CARE_PROVIDER_SITE_OTHER): Payer: Medicare Other

## 2021-02-03 DIAGNOSIS — Z23 Encounter for immunization: Secondary | ICD-10-CM | POA: Diagnosis not present

## 2021-02-05 DIAGNOSIS — G245 Blepharospasm: Secondary | ICD-10-CM | POA: Diagnosis not present

## 2021-02-16 DIAGNOSIS — M5451 Vertebrogenic low back pain: Secondary | ICD-10-CM | POA: Diagnosis not present

## 2021-02-18 DIAGNOSIS — M25552 Pain in left hip: Secondary | ICD-10-CM | POA: Diagnosis not present

## 2021-02-18 DIAGNOSIS — L578 Other skin changes due to chronic exposure to nonionizing radiation: Secondary | ICD-10-CM | POA: Diagnosis not present

## 2021-02-18 DIAGNOSIS — L821 Other seborrheic keratosis: Secondary | ICD-10-CM | POA: Diagnosis not present

## 2021-02-18 DIAGNOSIS — R2689 Other abnormalities of gait and mobility: Secondary | ICD-10-CM | POA: Diagnosis not present

## 2021-02-18 DIAGNOSIS — R531 Weakness: Secondary | ICD-10-CM | POA: Diagnosis not present

## 2021-02-18 DIAGNOSIS — L570 Actinic keratosis: Secondary | ICD-10-CM | POA: Diagnosis not present

## 2021-02-18 DIAGNOSIS — D2272 Melanocytic nevi of left lower limb, including hip: Secondary | ICD-10-CM | POA: Diagnosis not present

## 2021-02-18 DIAGNOSIS — M25652 Stiffness of left hip, not elsewhere classified: Secondary | ICD-10-CM | POA: Diagnosis not present

## 2021-02-18 DIAGNOSIS — D1721 Benign lipomatous neoplasm of skin and subcutaneous tissue of right arm: Secondary | ICD-10-CM | POA: Diagnosis not present

## 2021-02-18 DIAGNOSIS — Z23 Encounter for immunization: Secondary | ICD-10-CM | POA: Diagnosis not present

## 2021-02-19 DIAGNOSIS — H02135 Senile ectropion of left lower eyelid: Secondary | ICD-10-CM | POA: Diagnosis not present

## 2021-02-19 DIAGNOSIS — H02132 Senile ectropion of right lower eyelid: Secondary | ICD-10-CM | POA: Diagnosis not present

## 2021-02-19 DIAGNOSIS — H02401 Unspecified ptosis of right eyelid: Secondary | ICD-10-CM | POA: Diagnosis not present

## 2021-02-20 DIAGNOSIS — H02401 Unspecified ptosis of right eyelid: Secondary | ICD-10-CM | POA: Diagnosis not present

## 2021-02-20 DIAGNOSIS — M25552 Pain in left hip: Secondary | ICD-10-CM | POA: Diagnosis not present

## 2021-02-20 DIAGNOSIS — R531 Weakness: Secondary | ICD-10-CM | POA: Diagnosis not present

## 2021-02-20 DIAGNOSIS — H02421 Myogenic ptosis of right eyelid: Secondary | ICD-10-CM | POA: Diagnosis not present

## 2021-02-20 DIAGNOSIS — R2689 Other abnormalities of gait and mobility: Secondary | ICD-10-CM | POA: Diagnosis not present

## 2021-02-20 DIAGNOSIS — M25652 Stiffness of left hip, not elsewhere classified: Secondary | ICD-10-CM | POA: Diagnosis not present

## 2021-02-23 DIAGNOSIS — R2689 Other abnormalities of gait and mobility: Secondary | ICD-10-CM | POA: Diagnosis not present

## 2021-02-23 DIAGNOSIS — R531 Weakness: Secondary | ICD-10-CM | POA: Diagnosis not present

## 2021-02-23 DIAGNOSIS — M25552 Pain in left hip: Secondary | ICD-10-CM | POA: Diagnosis not present

## 2021-02-23 DIAGNOSIS — M25652 Stiffness of left hip, not elsewhere classified: Secondary | ICD-10-CM | POA: Diagnosis not present

## 2021-02-24 ENCOUNTER — Other Ambulatory Visit: Payer: Self-pay | Admitting: Internal Medicine

## 2021-02-25 DIAGNOSIS — R531 Weakness: Secondary | ICD-10-CM | POA: Diagnosis not present

## 2021-02-25 DIAGNOSIS — M25652 Stiffness of left hip, not elsewhere classified: Secondary | ICD-10-CM | POA: Diagnosis not present

## 2021-02-25 DIAGNOSIS — M25552 Pain in left hip: Secondary | ICD-10-CM | POA: Diagnosis not present

## 2021-02-25 DIAGNOSIS — R2689 Other abnormalities of gait and mobility: Secondary | ICD-10-CM | POA: Diagnosis not present

## 2021-02-27 DIAGNOSIS — R531 Weakness: Secondary | ICD-10-CM | POA: Diagnosis not present

## 2021-02-27 DIAGNOSIS — M25552 Pain in left hip: Secondary | ICD-10-CM | POA: Diagnosis not present

## 2021-02-27 DIAGNOSIS — R2689 Other abnormalities of gait and mobility: Secondary | ICD-10-CM | POA: Diagnosis not present

## 2021-02-27 DIAGNOSIS — M25652 Stiffness of left hip, not elsewhere classified: Secondary | ICD-10-CM | POA: Diagnosis not present

## 2021-03-02 DIAGNOSIS — R2689 Other abnormalities of gait and mobility: Secondary | ICD-10-CM | POA: Diagnosis not present

## 2021-03-02 DIAGNOSIS — M25552 Pain in left hip: Secondary | ICD-10-CM | POA: Diagnosis not present

## 2021-03-02 DIAGNOSIS — R531 Weakness: Secondary | ICD-10-CM | POA: Diagnosis not present

## 2021-03-02 DIAGNOSIS — M25652 Stiffness of left hip, not elsewhere classified: Secondary | ICD-10-CM | POA: Diagnosis not present

## 2021-03-02 DIAGNOSIS — M545 Low back pain, unspecified: Secondary | ICD-10-CM | POA: Diagnosis not present

## 2021-03-04 DIAGNOSIS — M9903 Segmental and somatic dysfunction of lumbar region: Secondary | ICD-10-CM | POA: Diagnosis not present

## 2021-03-04 DIAGNOSIS — M9902 Segmental and somatic dysfunction of thoracic region: Secondary | ICD-10-CM | POA: Diagnosis not present

## 2021-03-04 DIAGNOSIS — M9905 Segmental and somatic dysfunction of pelvic region: Secondary | ICD-10-CM | POA: Diagnosis not present

## 2021-03-04 DIAGNOSIS — M545 Low back pain, unspecified: Secondary | ICD-10-CM | POA: Diagnosis not present

## 2021-03-04 DIAGNOSIS — M25652 Stiffness of left hip, not elsewhere classified: Secondary | ICD-10-CM | POA: Diagnosis not present

## 2021-03-04 DIAGNOSIS — M5442 Lumbago with sciatica, left side: Secondary | ICD-10-CM | POA: Diagnosis not present

## 2021-03-04 DIAGNOSIS — M25552 Pain in left hip: Secondary | ICD-10-CM | POA: Diagnosis not present

## 2021-03-04 DIAGNOSIS — R531 Weakness: Secondary | ICD-10-CM | POA: Diagnosis not present

## 2021-03-04 DIAGNOSIS — R2689 Other abnormalities of gait and mobility: Secondary | ICD-10-CM | POA: Diagnosis not present

## 2021-03-10 DIAGNOSIS — R2689 Other abnormalities of gait and mobility: Secondary | ICD-10-CM | POA: Diagnosis not present

## 2021-03-10 DIAGNOSIS — R531 Weakness: Secondary | ICD-10-CM | POA: Diagnosis not present

## 2021-03-10 DIAGNOSIS — M25552 Pain in left hip: Secondary | ICD-10-CM | POA: Diagnosis not present

## 2021-03-10 DIAGNOSIS — M25652 Stiffness of left hip, not elsewhere classified: Secondary | ICD-10-CM | POA: Diagnosis not present

## 2021-03-10 DIAGNOSIS — M545 Low back pain, unspecified: Secondary | ICD-10-CM | POA: Diagnosis not present

## 2021-03-11 DIAGNOSIS — M9905 Segmental and somatic dysfunction of pelvic region: Secondary | ICD-10-CM | POA: Diagnosis not present

## 2021-03-11 DIAGNOSIS — M9902 Segmental and somatic dysfunction of thoracic region: Secondary | ICD-10-CM | POA: Diagnosis not present

## 2021-03-11 DIAGNOSIS — M9903 Segmental and somatic dysfunction of lumbar region: Secondary | ICD-10-CM | POA: Diagnosis not present

## 2021-03-11 DIAGNOSIS — M5442 Lumbago with sciatica, left side: Secondary | ICD-10-CM | POA: Diagnosis not present

## 2021-03-13 DIAGNOSIS — R2689 Other abnormalities of gait and mobility: Secondary | ICD-10-CM | POA: Diagnosis not present

## 2021-03-13 DIAGNOSIS — M545 Low back pain, unspecified: Secondary | ICD-10-CM | POA: Diagnosis not present

## 2021-03-13 DIAGNOSIS — M25652 Stiffness of left hip, not elsewhere classified: Secondary | ICD-10-CM | POA: Diagnosis not present

## 2021-03-13 DIAGNOSIS — R531 Weakness: Secondary | ICD-10-CM | POA: Diagnosis not present

## 2021-03-13 DIAGNOSIS — M25552 Pain in left hip: Secondary | ICD-10-CM | POA: Diagnosis not present

## 2021-03-20 DIAGNOSIS — M9905 Segmental and somatic dysfunction of pelvic region: Secondary | ICD-10-CM | POA: Diagnosis not present

## 2021-03-20 DIAGNOSIS — M9903 Segmental and somatic dysfunction of lumbar region: Secondary | ICD-10-CM | POA: Diagnosis not present

## 2021-03-20 DIAGNOSIS — M5442 Lumbago with sciatica, left side: Secondary | ICD-10-CM | POA: Diagnosis not present

## 2021-03-20 DIAGNOSIS — M9902 Segmental and somatic dysfunction of thoracic region: Secondary | ICD-10-CM | POA: Diagnosis not present

## 2021-03-24 ENCOUNTER — Ambulatory Visit (INDEPENDENT_AMBULATORY_CARE_PROVIDER_SITE_OTHER): Payer: Medicare Other | Admitting: Nurse Practitioner

## 2021-03-24 ENCOUNTER — Encounter: Payer: Self-pay | Admitting: Nurse Practitioner

## 2021-03-24 ENCOUNTER — Other Ambulatory Visit: Payer: Self-pay

## 2021-03-24 VITALS — BP 116/70 | HR 71 | Temp 97.2°F | Ht 62.0 in | Wt 135.0 lb

## 2021-03-24 DIAGNOSIS — R519 Headache, unspecified: Secondary | ICD-10-CM

## 2021-03-24 DIAGNOSIS — H5340 Unspecified visual field defects: Secondary | ICD-10-CM | POA: Diagnosis not present

## 2021-03-24 DIAGNOSIS — G8929 Other chronic pain: Secondary | ICD-10-CM | POA: Diagnosis not present

## 2021-03-24 DIAGNOSIS — H53453 Other localized visual field defect, bilateral: Secondary | ICD-10-CM | POA: Diagnosis not present

## 2021-03-24 MED ORDER — NAPROXEN 500 MG PO TABS: 500.0000 mg | ORAL_TABLET | Freq: Two times a day (BID) | ORAL | 0 refills | Status: AC

## 2021-03-24 NOTE — Progress Notes (Signed)
   Subjective:    Patient ID: [name] [name], female    DOB: [birth year], 80 y.o.   MRN: [id number]  HPI Patient reports to clinic with complaints of migraines with vision changes. On Wednesday, patient states that she she experienced a migraine that started with "sparkles" in her eyes and was felt primarily on the left side of her head. Patient describes this migraine as different and more intense then her previous migraines. Patient then states that she noticed that half of her vision was black when trying to microwave her breakfast. Patient admits to also having photosensitivity and auditory sensitivity. Migraine and visual changes were relieved by rest in a dark room. Patient tried Tylenol but it was not helpful. Patient states that Thursday she did not have a migraine. But on Friday she experienced a similar migraine with similar vision changes however she describes the headache as all over head. No headaches over the weekend.  Patient denies having a headache while in clinic.    MRI done on 05/24/2020; negative report.   Hx of Non-Hodgkin's Lymphoma  Review of Systems  Constitutional:  Negative for chills and fever.  Eyes:  Positive for visual disturbance.  Neurological:  Positive for headaches. Negative for dizziness, tremors, seizures, syncope and weakness.      Objective:   Physical Exam Constitutional:      Appearance: Normal appearance.  Cardiovascular:     Rate and Rhythm: Normal rate and regular rhythm.     Pulses: Normal pulses.     Heart sounds: Normal heart sounds. No murmur heard. Pulmonary:     Effort: Pulmonary effort is normal. No respiratory distress.     Breath sounds: Normal breath sounds. No wheezing.  Skin:    General: Skin is warm.  Neurological:     General: No focal deficit present.     Mental Status: She is alert and oriented to person, place, and time.     Cranial Nerves: No cranial nerve deficit.     Sensory: No sensory deficit.     Motor: No  weakness.     Coordination: Coordination normal.     Gait: Gait normal.  Psychiatric:        Mood and Affect: Mood normal.        Behavior: Behavior normal.        Thought Content: Thought content normal.        Judgment: Judgment normal.          Assessment & Plan:    1. Intractable episodic headache, unspecified headache type - Suspect Migraine with Aura; however due to age will r/o structural causes (to include cerebral vascular changes), inflammatory causes, and autoimmune causes.  - CBC with Differential - Sed Rate (ESR) - C-reactive protein - MR ANGIO HEAD WO W CONTRAST; Future - US CAROTID Bilateral; Future - Follow up in 4 weeks  - Reviewed s/s of stroke; instructed patient to go to ED if she experiences slurred speech, loss of vision, headache that won't go away, weakness, loss of sensation, etc.  - Naproxen $RemoveBe'500mg'[id number]$  BID PRN  2. Other localized visual field defect, bilateral - MR ANGIO HEAD WO W CONTRAST; Future - US CAROTID Bilateral; Future

## 2021-03-25 LAB — CBC WITH DIFFERENTIAL/PLATELET
Basophils Absolute: 0.1 10*3/uL (ref 0.0–0.2)
Basos: 1 %
EOS (ABSOLUTE): 0.1 10*3/uL (ref 0.0–0.4)
Eos: 1 %
Hematocrit: 40.2 % (ref 34.0–46.6)
Hemoglobin: 13.7 g/dL (ref 11.1–15.9)
Immature Grans (Abs): 0 10*3/uL (ref 0.0–0.1)
Immature Granulocytes: 0 %
Lymphocytes Absolute: 1.7 10*3/uL (ref 0.7–3.1)
Lymphs: 17 %
MCH: 30.6 pg (ref 26.6–33.0)
MCHC: 34.1 g/dL (ref 31.5–35.7)
MCV: 90 fL (ref 79–97)
Monocytes Absolute: 1.2 10*3/uL — ABNORMAL HIGH (ref 0.1–0.9)
Monocytes: 12 %
Neutrophils Absolute: 7 10*3/uL (ref 1.4–7.0)
Neutrophils: 69 %
Platelets: 280 10*3/uL (ref 150–450)
RBC: 4.48 x10E6/uL (ref 3.77–5.28)
RDW: 12.1 % (ref 11.7–15.4)
WBC: 10.1 10*3/uL (ref 3.4–10.8)

## 2021-03-25 LAB — SEDIMENTATION RATE: Sed Rate: 2 mm/hr (ref 0–40)

## 2021-03-25 LAB — C-REACTIVE PROTEIN: CRP: 1 mg/L (ref 0–10)

## 2021-03-27 NOTE — Progress Notes (Signed)
Please ensure that [name] [name] received this [employer].  Your labs looked good. No infections or evidence of inflammation noted. Will await results of MRI for possible causes of headaches. As a reminder, if your headaches become more frequent or worse, or if you experience stroke symptoms please go to the ED.

## 2021-03-30 DIAGNOSIS — M9905 Segmental and somatic dysfunction of pelvic region: Secondary | ICD-10-CM | POA: Diagnosis not present

## 2021-03-30 DIAGNOSIS — M9903 Segmental and somatic dysfunction of lumbar region: Secondary | ICD-10-CM | POA: Diagnosis not present

## 2021-03-30 DIAGNOSIS — M9902 Segmental and somatic dysfunction of thoracic region: Secondary | ICD-10-CM | POA: Diagnosis not present

## 2021-03-30 DIAGNOSIS — M5442 Lumbago with sciatica, left side: Secondary | ICD-10-CM | POA: Diagnosis not present

## 2021-04-01 DIAGNOSIS — M5442 Lumbago with sciatica, left side: Secondary | ICD-10-CM | POA: Diagnosis not present

## 2021-04-01 DIAGNOSIS — M9902 Segmental and somatic dysfunction of thoracic region: Secondary | ICD-10-CM | POA: Diagnosis not present

## 2021-04-01 DIAGNOSIS — M9905 Segmental and somatic dysfunction of pelvic region: Secondary | ICD-10-CM | POA: Diagnosis not present

## 2021-04-01 DIAGNOSIS — M9903 Segmental and somatic dysfunction of lumbar region: Secondary | ICD-10-CM | POA: Diagnosis not present

## 2021-04-02 NOTE — Addendum Note (Signed)
Addended by: Claire Shown on: 04/02/2021 08:29 AM   Modules accepted: Orders

## 2021-04-03 DIAGNOSIS — M9902 Segmental and somatic dysfunction of thoracic region: Secondary | ICD-10-CM | POA: Diagnosis not present

## 2021-04-03 DIAGNOSIS — M9903 Segmental and somatic dysfunction of lumbar region: Secondary | ICD-10-CM | POA: Diagnosis not present

## 2021-04-03 DIAGNOSIS — M5442 Lumbago with sciatica, left side: Secondary | ICD-10-CM | POA: Diagnosis not present

## 2021-04-03 DIAGNOSIS — M9905 Segmental and somatic dysfunction of pelvic region: Secondary | ICD-10-CM | POA: Diagnosis not present

## 2021-04-06 DIAGNOSIS — M9903 Segmental and somatic dysfunction of lumbar region: Secondary | ICD-10-CM | POA: Diagnosis not present

## 2021-04-06 DIAGNOSIS — M9902 Segmental and somatic dysfunction of thoracic region: Secondary | ICD-10-CM | POA: Diagnosis not present

## 2021-04-06 DIAGNOSIS — M5442 Lumbago with sciatica, left side: Secondary | ICD-10-CM | POA: Diagnosis not present

## 2021-04-06 DIAGNOSIS — M9905 Segmental and somatic dysfunction of pelvic region: Secondary | ICD-10-CM | POA: Diagnosis not present

## 2021-04-09 ENCOUNTER — Encounter: Payer: Self-pay | Admitting: Nurse Practitioner

## 2021-04-09 ENCOUNTER — Other Ambulatory Visit (HOSPITAL_COMMUNITY)
Admission: RE | Admit: 2021-04-09 | Discharge: 2021-04-09 | Disposition: A | Discharge status: Home / Self Care | Payer: Medicare Other | Source: Ambulatory Visit | Attending: Nurse Practitioner | Admitting: Nurse Practitioner

## 2021-04-09 ENCOUNTER — Ambulatory Visit (INDEPENDENT_AMBULATORY_CARE_PROVIDER_SITE_OTHER): Payer: Medicare Other | Admitting: Nurse Practitioner

## 2021-04-09 ENCOUNTER — Ambulatory Visit (HOSPITAL_COMMUNITY)
Admission: RE | Admit: 2021-04-09 | Discharge: 2021-04-09 | Disposition: A | Discharge status: Home / Self Care | Payer: Medicare Other | Source: Ambulatory Visit | Attending: Nurse Practitioner | Admitting: Nurse Practitioner

## 2021-04-09 ENCOUNTER — Other Ambulatory Visit: Payer: Self-pay

## 2021-04-09 VITALS — BP 124/82 | HR 76 | Temp 97.7°F | Wt 134.4 lb

## 2021-04-09 DIAGNOSIS — M79605 Pain in left leg: Secondary | ICD-10-CM | POA: Insufficient documentation

## 2021-04-09 DIAGNOSIS — M79662 Pain in left lower leg: Secondary | ICD-10-CM | POA: Diagnosis not present

## 2021-04-09 LAB — D-DIMER, QUANTITATIVE: D-Dimer, Quant: 0.54 ug/mL-FEU — ABNORMAL HIGH (ref 0.00–0.50)

## 2021-04-09 NOTE — Progress Notes (Signed)
Please contact the patient with the following message. I have sent a Mychart message and attempted to call twice with no answer.  Reviewed your D-Dimer lab and your Ultrasound. D-dimer slightly elevated, however it is reassuring that no blood clot was found. Would like for you to keep your appointment next week for re-evaluation and possible repeat Ultrasound. Please remember if you experience more intense leg pain, chest pain, or SOB before your appointment next week, please go to the Emergency Room.  We will see you soon.

## 2021-04-09 NOTE — Progress Notes (Signed)
   Subjective:    Patient ID: [name] [name], female    DOB: 1941/01/11, 80 y.o.   MRN: [id number]  HPI  Patient arrives with throbbing left calf pain x1 week. Pain is localized to the back and side of the left leg. Patient states she is taking therapy for sciatica and her left calf started bothering her a week ago. Pain is worse when standing. Patient is better when laying down and propping feet up. Patient states that she feels left leg is slightly more swollen then right leg. Patient denies that her leg feels warm to touch. Patient also denies SOB, chest pain, fever, chills.   F/u on Headaches Patient states that her brain MRI is scheduled for 04/16/2021. Patient states that headaches are better and not as sharp as they used to be. Naproxen helps.   Review of Systems  Constitutional:  Negative for chills, fatigue and fever.  Respiratory:  Negative for cough, chest tightness, shortness of breath and wheezing.   Cardiovascular:  Positive for leg swelling. Negative for chest pain and palpitations.       Left leg swelling  Musculoskeletal:  Negative for gait problem, joint swelling, myalgias, neck pain and neck stiffness.       Left leg pain  Neurological:  Positive for headaches.      Objective:   Physical Exam Constitutional:      General: She is not in acute distress.    Appearance: Normal appearance. She is normal weight. She is not ill-appearing or toxic-appearing.  Cardiovascular:     Rate and Rhythm: Normal rate and regular rhythm.     Pulses: Normal pulses.     Heart sounds: Normal heart sounds. No murmur heard. Pulmonary:     Effort: Pulmonary effort is normal. No respiratory distress.     Breath sounds: Normal breath sounds. No wheezing.  Musculoskeletal:        General: No tenderness, deformity or signs of injury.     Right lower leg: Normal. No swelling, deformity, lacerations, tenderness or bony tenderness. No edema.     Left lower leg: Swelling present. No deformity,  lacerations, tenderness or bony tenderness. No edema.     Comments: Patient denies tenderness to L leg with palpation to anterior and posterior side of leg. Homan's sign negative. Very mild swelling to lateral side of left leg when compared to R leg.  Left leg not warm to touch R leg circumference= 30.5 cm L leg circumference = 31.0 cm   Skin:    General: Skin is warm.     Capillary Refill: Capillary refill takes less than 2 seconds.  Neurological:     General: No focal deficit present.     Mental Status: She is alert and oriented to person, place, and time.  Psychiatric:        Mood and Affect: Mood normal.        Behavior: Behavior normal.          Assessment & Plan:   1. Left leg pain - Possible DVT - Well criteria = 1 - D-Dimer, Quantitative, STAT - US Venous Img Lower Unilateral Left, STAT - Follow up in 1 week  - If [place] positive for DVT will start anticoagulation therapy. - If [place] negative for DVT but D-dimer positive, will consider re-imaging leg in one week. - If [place] and D-dimer negative for DVT, will follow up in one week and consider pain management and continued PT

## 2021-04-09 NOTE — Progress Notes (Signed)
U/S of left leg pending. Age adjusted D-Dimer not likely for a VTE. However, U/S of leg done to r/o DVT given age and co-morbidities.

## 2021-04-10 DIAGNOSIS — M9902 Segmental and somatic dysfunction of thoracic region: Secondary | ICD-10-CM | POA: Diagnosis not present

## 2021-04-10 DIAGNOSIS — M5442 Lumbago with sciatica, left side: Secondary | ICD-10-CM | POA: Diagnosis not present

## 2021-04-10 DIAGNOSIS — M9905 Segmental and somatic dysfunction of pelvic region: Secondary | ICD-10-CM | POA: Diagnosis not present

## 2021-04-10 DIAGNOSIS — M9903 Segmental and somatic dysfunction of lumbar region: Secondary | ICD-10-CM | POA: Diagnosis not present

## 2021-04-13 ENCOUNTER — Ambulatory Visit (INDEPENDENT_AMBULATORY_CARE_PROVIDER_SITE_OTHER): Payer: Medicare Other | Admitting: Family Medicine

## 2021-04-13 ENCOUNTER — Other Ambulatory Visit: Payer: Self-pay

## 2021-04-13 VITALS — BP 148/84 | HR 100 | Temp 98.1°F | Wt 134.0 lb

## 2021-04-13 DIAGNOSIS — U071 COVID-19: Secondary | ICD-10-CM | POA: Diagnosis not present

## 2021-04-13 MED ORDER — NIRMATRELVIR/RITONAVIR (PAXLOVID)TABLET: ORAL_TABLET | ORAL | 0 refills | Status: DC

## 2021-04-13 NOTE — Patient Instructions (Signed)
Hold the crestor.  Paxlovid as prescribed.  Hope you feel better soon.  Take care  Dr. Lacinda Axon

## 2021-04-14 ENCOUNTER — Telehealth: Payer: Self-pay

## 2021-04-14 DIAGNOSIS — U071 COVID-19: Secondary | ICD-10-CM | POA: Insufficient documentation

## 2021-04-14 NOTE — Progress Notes (Signed)
Subjective:  Patient ID: [name] [name], female    DOB: Jan 05, 1941  Age: 80 y.o. MRN: [id number]  CC: Chief Complaint  Patient presents with   Cough    Pt having cough, congestion, drainage, sore throat/pain. Pt states she took a test this afternoon and it looked to be positive. Fever off and on    HPI:  80 year old female presents with respiratory symptoms.  Symptoms started over the weekend.  She reports cough, congestion, drainage, sore throat.  She took a home COVID test and it was positive.  She has had a low-grade temperature.  No documented true fever.  Patient states that she feels poorly.  She is concerned and would like to know what to do given her positive home COVID test.  No current shortness of breath.  No other associated symptoms.  No other complaints.  Patient Active Problem List   Diagnosis Date Noted   COVID 04/14/2021   Goals of care, counseling/discussion 07/07/2018   Myopia 12/27/2016   Vitreomacular adhesion 12/27/2016   Nuclear sclerotic cataract of both eyes 10/11/2016   Optic neuropathy, left 10/11/2016   Pseudoexfoliation of lens capsule, left eye 10/11/2016   PVD (posterior vitreous detachment), both eyes 10/11/2016   Retinal degeneration 10/11/2016   Retinal edema 10/11/2016   Peripheral neuropathy 08/26/2016   Anal sphincter incompetence 12/31/2015   Dyssynergic defecation 12/31/2015   HTN (hypertension) 04/18/2015   Bilateral ocular hypertension 11/19/2014   Dry eye syndrome 11/19/2014   Glaucoma suspect 11/19/2014   Macular cyst, hole, or pseudohole of retina, left eye 11/19/2014   NSVT (nonsustained ventricular tachycardia) 11/27/2013   Allergic rhinitis 08/20/2013   Diverticulitis of colon 01/08/2011   Hyperlipidemia 08/27/2008   MVP (mitral valve prolapse) 08/27/2008    IBS- diarrhea predominant 10/13/2007   OSTEOPENIA 06/16/2007   NHL (non-Hodgkin's lymphoma) ([place]) 06/16/2007    Social Hx   Social History   Socioeconomic History    Marital status: Widowed    Spouse name: Not on file   Number of children: 1   Years of education: Not on file   Highest education level: Not on file  Occupational History   Occupation: Medical sales representative work part time    Employer: RETIRED  Tobacco Use   Smoking status: Former    Packs/day: 0.33    Types: Cigarettes   Smokeless tobacco: Never   Tobacco comments:    quit 1976  Vaping Use   Vaping Use: Never used  Substance and Sexual Activity   Alcohol use: Yes    Alcohol/week: 0.0 standard drinks    Comment: very rare   Drug use: No   Sexual activity: Yes    Birth control/protection: Surgical  Other Topics Concern   Not on file  Social History Narrative   Retired, widowed, 1 child   Rare EtOH, former smoker none now   No drugs   Right handed   Social Determinants of Health   Financial Resource Strain: Not on file  Food Insecurity: Not on file  Transportation Needs: Not on file  Physical Activity: Not on file  Stress: Not on file  Social Connections: Not on file    Review of Systems Per HPI  Objective:  BP (!) 148/84   Pulse 100   Temp 98.1 F (36.7 C)   Wt 134 lb (60.8 kg)   SpO2 97%   BMI 24.51 kg/m   BP/Weight 04/13/2021 04/09/2021 94/85/4627  Systolic BP 035 009 381  Diastolic BP 84 82 70  Wt. (  Lbs) 134 134.4 135  BMI 24.51 24.58 24.69    Physical Exam Vitals and nursing note reviewed.  Constitutional:      General: She is not in acute distress.    Appearance: Normal appearance. She is not ill-appearing.  HENT:     Head: Normocephalic and atraumatic.  Cardiovascular:     Rate and Rhythm: Normal rate and regular rhythm.  Pulmonary:     Effort: Pulmonary effort is normal.     Breath sounds: Normal breath sounds. No wheezing or rales.  Neurological:     Mental Status: She is alert.  Psychiatric:        Mood and Affect: Mood normal.        Behavior: Behavior normal.    Lab Results  Component Value Date   WBC 10.1 03/24/2021   HGB 13.7  03/24/2021   HCT 40.2 03/24/2021   PLT 280 03/24/2021   GLUCOSE 86 10/27/2020   CHOL 135 07/23/2020   TRIG 52 07/23/2020   HDL 60 07/23/2020   LDLCALC 63 07/23/2020   ALT 26 08/28/2020   AST 27 08/28/2020   NA 143 10/27/2020   K 4.5 10/27/2020   CL 107 10/27/2020   CREATININE 0.79 10/27/2020   BUN 15 10/27/2020   CO2 26 10/27/2020   TSH 3.110 06/19/2019   INR 0.94 06/16/2018   HGBA1C 5.3 10/05/2019     Assessment & Plan:   Problem List Items Addressed This Visit       Other   COVID - Primary    Treating with Paxlovid.  Advised to hold statin while on Paxlovid.      Relevant Medications   nirmatrelvir/ritonavir EUA (PAXLOVID) 20 x 150 MG & 10 x 100MG  TABS    Meds ordered this encounter  Medications   nirmatrelvir/ritonavir EUA (PAXLOVID) 20 x 150 MG & 10 x 100MG  TABS    Sig: Take nirmatrelvir 150 mg two tablets twice daily for 5 days and ritonavir 100 mg one tablet twice daily for 5 days. Patient GFR is >60.    Dispense:  30 tablet    Refill:  [place]

## 2021-04-14 NOTE — Telephone Encounter (Signed)
Pt called stated she tested positive for covid on 04/13/21 informed Pt that her appointment would have to be rescheduled. Per this offices policy 10 days after you test positive Pt verbalized understanding. Scheduling message sent to reschedule appointments.

## 2021-04-14 NOTE — Assessment & Plan Note (Signed)
Treating with Paxlovid.  Advised to hold statin while on Paxlovid.

## 2021-04-15 ENCOUNTER — Ambulatory Visit: Payer: Medicare Other | Admitting: Family Medicine

## 2021-04-16 ENCOUNTER — Ambulatory Visit (HOSPITAL_COMMUNITY): Payer: Medicare Other

## 2021-04-17 DIAGNOSIS — M5442 Lumbago with sciatica, left side: Secondary | ICD-10-CM | POA: Diagnosis not present

## 2021-04-17 DIAGNOSIS — M9903 Segmental and somatic dysfunction of lumbar region: Secondary | ICD-10-CM | POA: Diagnosis not present

## 2021-04-17 DIAGNOSIS — M9902 Segmental and somatic dysfunction of thoracic region: Secondary | ICD-10-CM | POA: Diagnosis not present

## 2021-04-17 DIAGNOSIS — M9905 Segmental and somatic dysfunction of pelvic region: Secondary | ICD-10-CM | POA: Diagnosis not present

## 2021-04-20 ENCOUNTER — Inpatient Hospital Stay: Payer: Medicare Other | Admitting: Oncology

## 2021-04-20 ENCOUNTER — Inpatient Hospital Stay: Payer: Medicare Other

## 2021-04-20 DIAGNOSIS — M5442 Lumbago with sciatica, left side: Secondary | ICD-10-CM | POA: Diagnosis not present

## 2021-04-20 DIAGNOSIS — M9903 Segmental and somatic dysfunction of lumbar region: Secondary | ICD-10-CM | POA: Diagnosis not present

## 2021-04-20 DIAGNOSIS — M9902 Segmental and somatic dysfunction of thoracic region: Secondary | ICD-10-CM | POA: Diagnosis not present

## 2021-04-20 DIAGNOSIS — M9905 Segmental and somatic dysfunction of pelvic region: Secondary | ICD-10-CM | POA: Diagnosis not present

## 2021-04-22 ENCOUNTER — Ambulatory Visit: Payer: Medicare Other | Admitting: Nurse Practitioner

## 2021-04-24 ENCOUNTER — Ambulatory Visit (INDEPENDENT_AMBULATORY_CARE_PROVIDER_SITE_OTHER): Payer: Medicare Other | Admitting: Family Medicine

## 2021-04-24 ENCOUNTER — Other Ambulatory Visit: Payer: Self-pay

## 2021-04-24 ENCOUNTER — Inpatient Hospital Stay: Payer: Medicare Other | Admitting: Oncology

## 2021-04-24 ENCOUNTER — Encounter: Payer: Self-pay | Admitting: Family Medicine

## 2021-04-24 ENCOUNTER — Inpatient Hospital Stay: Payer: Medicare Other

## 2021-04-24 VITALS — Temp 97.3°F | Wt 130.6 lb

## 2021-04-24 DIAGNOSIS — R0981 Nasal congestion: Secondary | ICD-10-CM | POA: Diagnosis not present

## 2021-04-24 DIAGNOSIS — M9902 Segmental and somatic dysfunction of thoracic region: Secondary | ICD-10-CM | POA: Diagnosis not present

## 2021-04-24 DIAGNOSIS — M9903 Segmental and somatic dysfunction of lumbar region: Secondary | ICD-10-CM | POA: Diagnosis not present

## 2021-04-24 DIAGNOSIS — M5442 Lumbago with sciatica, left side: Secondary | ICD-10-CM | POA: Diagnosis not present

## 2021-04-24 DIAGNOSIS — M9905 Segmental and somatic dysfunction of pelvic region: Secondary | ICD-10-CM | POA: Diagnosis not present

## 2021-04-24 NOTE — Progress Notes (Signed)
° °  Subjective:    Patient ID: [name] [name], female    DOB: 1941/03/05, 80 y.o.   MRN: [id number]  HPI Pt having ear discomfort. Describes it as an "itchy feeling down in ear". Going on since she had COVID. Pt states throat is scratchy; believes it may be residual mucus from COVID.  Denies wheezing difficulty breathing no shortness of breath no high fever still have a lot of head congestion some drainage no discolored mucus cannot get anything up currently  Review of Systems     Objective:   Physical Exam  Gen-NAD not toxic TMS-normal bilateral T- normal no redness Chest-CTA respiratory rate normal no crackles CV RRR no murmur Skin-warm dry Neuro-grossly normal       Assessment & Plan:  Post COVID congestion No sign of bacterial infection No need for antibiotics Warning signs discussed No sign of any postinfection pneumonia If progressive troubles or worse follow-up Otherwise regular visits with Dr. Lacinda Axon

## 2021-04-28 ENCOUNTER — Other Ambulatory Visit: Payer: Self-pay

## 2021-04-28 ENCOUNTER — Ambulatory Visit (HOSPITAL_COMMUNITY)
Admission: RE | Admit: 2021-04-28 | Discharge: 2021-04-28 | Disposition: A | Discharge status: Home / Self Care | Payer: Medicare Other | Source: Ambulatory Visit | Attending: Nurse Practitioner | Admitting: Nurse Practitioner

## 2021-04-28 DIAGNOSIS — G8929 Other chronic pain: Secondary | ICD-10-CM | POA: Diagnosis not present

## 2021-04-28 DIAGNOSIS — R519 Headache, unspecified: Secondary | ICD-10-CM | POA: Insufficient documentation

## 2021-04-28 DIAGNOSIS — I6522 Occlusion and stenosis of left carotid artery: Secondary | ICD-10-CM | POA: Diagnosis not present

## 2021-04-29 DIAGNOSIS — M9902 Segmental and somatic dysfunction of thoracic region: Secondary | ICD-10-CM | POA: Diagnosis not present

## 2021-04-29 DIAGNOSIS — M5442 Lumbago with sciatica, left side: Secondary | ICD-10-CM | POA: Diagnosis not present

## 2021-04-29 DIAGNOSIS — M9905 Segmental and somatic dysfunction of pelvic region: Secondary | ICD-10-CM | POA: Diagnosis not present

## 2021-04-29 DIAGNOSIS — M9903 Segmental and somatic dysfunction of lumbar region: Secondary | ICD-10-CM | POA: Diagnosis not present

## 2021-05-05 ENCOUNTER — Encounter: Payer: Self-pay | Admitting: Family Medicine

## 2021-05-05 ENCOUNTER — Other Ambulatory Visit: Payer: Self-pay

## 2021-05-05 ENCOUNTER — Ambulatory Visit (INDEPENDENT_AMBULATORY_CARE_PROVIDER_SITE_OTHER): Payer: Medicare Other | Admitting: Family Medicine

## 2021-05-05 VITALS — BP 122/72 | HR 81 | Temp 98.5°F | Wt 132.6 lb

## 2021-05-05 DIAGNOSIS — B029 Zoster without complications: Secondary | ICD-10-CM | POA: Insufficient documentation

## 2021-05-05 MED ORDER — PREDNISONE 10 MG PO TABS: ORAL_TABLET | ORAL | 0 refills | Status: DC

## 2021-05-05 MED ORDER — VALACYCLOVIR HCL 1 G PO TABS: 1000.0000 mg | ORAL_TABLET | Freq: Three times a day (TID) | ORAL | 0 refills | Status: AC

## 2021-05-05 NOTE — Assessment & Plan Note (Signed)
Treating with Valtrex and prednisone.

## 2021-05-05 NOTE — Progress Notes (Signed)
Will discuss at next appointment on 05/07/2021

## 2021-05-05 NOTE — Progress Notes (Signed)
Subjective:  Patient ID: [name] [name], female    DOB: 31-Dec-1940  Age: 81 y.o. MRN: [id number]  CC: Chief Complaint  Patient presents with   Rash    Pt states she is pretty sure she has shingles. Rash on back. Pt states she feels the pins and needles effect and burning. Started Saturday with feeling like hot needles poking in skin.     HPI:  81 year old female presents with concerns for shingles.  Patient reports that on Saturday she started having sharp pains and a burning sensation around her right scapula.  She states that this is similar to a prior presentation of shingles.  Patient states that she has recently had COVID-19 and feels that her immune system is down.  She is unsure if she has a rash as it is difficult to see.  No fever.  No relieving factors.  No other associated symptoms.  No other complaints.  Patient Active Problem List   Diagnosis Date Noted   Herpes zoster without complication 22/06/5425   COVID 04/14/2021   Vitreomacular adhesion 12/27/2016   Nuclear sclerotic cataract of both eyes 10/11/2016   Optic neuropathy, left 10/11/2016   Pseudoexfoliation of lens capsule, left eye 10/11/2016   PVD (posterior vitreous detachment), both eyes 10/11/2016   Retinal degeneration 10/11/2016   Retinal edema 10/11/2016   Peripheral neuropathy 08/26/2016   Anal sphincter incompetence 12/31/2015   Dyssynergic defecation 12/31/2015   HTN (hypertension) 04/18/2015   Bilateral ocular hypertension 11/19/2014   Dry eye syndrome 11/19/2014   Glaucoma suspect 11/19/2014   Macular cyst, hole, or pseudohole of retina, left eye 11/19/2014   NSVT (nonsustained ventricular tachycardia) 11/27/2013   Allergic rhinitis 08/20/2013   Diverticulitis of colon 01/08/2011   Hyperlipidemia 08/27/2008   MVP (mitral valve prolapse) 08/27/2008    IBS- diarrhea predominant 10/13/2007   OSTEOPENIA 06/16/2007   NHL (non-Hodgkin's lymphoma) ([place]) 06/16/2007    Social Hx   Social History    Socioeconomic History   Marital status: Widowed    Spouse name: Not on file   Number of children: 1   Years of education: Not on file   Highest education level: Not on file  Occupational History   Occupation: Medical sales representative work part time    Employer: RETIRED  Tobacco Use   Smoking status: Former    Packs/day: 0.33    Types: Cigarettes   Smokeless tobacco: Never   Tobacco comments:    quit 1976  Vaping Use   Vaping Use: Never used  Substance and Sexual Activity   Alcohol use: Yes    Alcohol/week: 0.0 standard drinks    Comment: very rare   Drug use: No   Sexual activity: Yes    Birth control/protection: Surgical  Other Topics Concern   Not on file  Social History Narrative   Retired, widowed, 1 child   Rare EtOH, former smoker none now   No drugs   Right handed   Social Determinants of Health   Financial Resource Strain: Not on file  Food Insecurity: Not on file  Transportation Needs: Not on file  Physical Activity: Not on file  Stress: Not on file  Social Connections: Not on file    Review of Systems Per HPI  Objective:  BP 122/72    Pulse 81    Temp 98.5 F (36.9 C)    Wt 132 lb 9.6 oz (60.1 kg)    SpO2 98%    BMI 24.25 kg/m   BP/Weight 05/05/2021 04/24/2021  23/55/7322  Systolic BP 025 - 427  Diastolic BP 72 - 84  Wt. (Lbs) 132.6 130.6 134  BMI 24.25 23.89 24.51    Physical Exam Constitutional:      General: She is not in acute distress.    Appearance: Normal appearance. She is not ill-appearing.  HENT:     Head: Normocephalic and atraumatic.  Pulmonary:     Effort: Pulmonary effort is normal.  Skin:         Comments: Erythematous vesicular rash noted at the labeled location.  Neurological:     Mental Status: She is alert.  Psychiatric:        Mood and Affect: Mood normal.        Behavior: Behavior normal.    Lab Results  Component Value Date   WBC 10.1 03/24/2021   HGB 13.7 03/24/2021   HCT 40.2 03/24/2021   PLT 280 03/24/2021    GLUCOSE 86 10/27/2020   CHOL 135 07/23/2020   TRIG 52 07/23/2020   HDL 60 07/23/2020   LDLCALC 63 07/23/2020   ALT 26 08/28/2020   AST 27 08/28/2020   NA 143 10/27/2020   K 4.5 10/27/2020   CL 107 10/27/2020   CREATININE 0.79 10/27/2020   BUN 15 10/27/2020   CO2 26 10/27/2020   TSH 3.110 06/19/2019   INR 0.94 06/16/2018   HGBA1C 5.3 10/05/2019     Assessment & Plan:   Problem List Items Addressed This Visit       Other   Herpes zoster without complication - Primary    Treating with Valtrex and prednisone.      Relevant Medications   valACYclovir (VALTREX) 1000 MG tablet    Meds ordered this encounter  Medications   valACYclovir (VALTREX) 1000 MG tablet    Sig: Take 1 tablet (1,000 mg total) by mouth 3 (three) times daily for 7 days.    Dispense:  21 tablet    Refill:  0   predniSONE (DELTASONE) 10 MG tablet    Sig: 50 mg daily x 2 days, then 40 mg daily x 2 days, then 30 mg daily x 2 days, then 20 mg daily x 2 days, then 10 mg daily x 2 days.    Dispense:  30 tablet    Refill:  0    Follow-up:  As previously scheduled.  [place]

## 2021-05-06 ENCOUNTER — Other Ambulatory Visit: Payer: Self-pay

## 2021-05-06 ENCOUNTER — Ambulatory Visit: Payer: Medicare Other | Admitting: Family Medicine

## 2021-05-06 MED ORDER — ATENOLOL 25 MG PO TABS: ORAL_TABLET | ORAL | 1 refills | Status: DC

## 2021-05-07 ENCOUNTER — Ambulatory Visit (INDEPENDENT_AMBULATORY_CARE_PROVIDER_SITE_OTHER): Payer: Medicare Other | Admitting: Nurse Practitioner

## 2021-05-07 ENCOUNTER — Encounter: Payer: Self-pay | Admitting: Nurse Practitioner

## 2021-05-07 ENCOUNTER — Other Ambulatory Visit: Payer: Self-pay

## 2021-05-07 VITALS — BP 137/70 | HR 72 | Temp 97.4°F | Ht 62.0 in | Wt 132.0 lb

## 2021-05-07 DIAGNOSIS — R519 Headache, unspecified: Secondary | ICD-10-CM | POA: Diagnosis not present

## 2021-05-07 DIAGNOSIS — B029 Zoster without complications: Secondary | ICD-10-CM | POA: Diagnosis not present

## 2021-05-07 DIAGNOSIS — G8929 Other chronic pain: Secondary | ICD-10-CM

## 2021-05-07 NOTE — Progress Notes (Signed)
° °  Subjective:    Patient ID: [name] [name], female    DOB: 16-Jul-1940, 81 y.o.   MRN: [id number]  HPI  Patient here for follow up headaches and to discuss MRI results.  Patient states that headaches are much better and that she has only had two headaches since December 2022. She describes headaches as small and mild which are relieved with rest and ibuprofen.   Discussed the results of her MRI. No acute findings on brain MRI. Carotid u/s noted hypoechoic/fibrofatty plaque in the left carotid bulb which results in less than 50% stenosis by Doppler criteria.  Patient previously seen by provider for Shingles on 05/05/2021. Prescribed prednisone and valtrex. Has burning pain to rash and would like to know what else she can do for them.    Review of Systems  Skin:  Positive for rash.  All other systems reviewed and are negative.     Objective:   Physical Exam Constitutional:      General: She is not in acute distress.    Appearance: She is well-developed and normal weight. She is not ill-appearing or toxic-appearing.  HENT:     Head: Normocephalic.  Cardiovascular:     Rate and Rhythm: Normal rate and regular rhythm.     Heart sounds: Normal heart sounds. No murmur heard. Pulmonary:     Effort: Pulmonary effort is normal. No respiratory distress.     Breath sounds: No wheezing.  Musculoskeletal:        General: Normal range of motion.     Cervical back: Normal range of motion.  Skin:    General: Skin is warm.  Neurological:     Mental Status: She is alert and oriented to person, place, and time.  Psychiatric:        Mood and Affect: Mood normal.        Behavior: Behavior normal.          Assessment & Plan:   1. Chronic headache with new features - Headaches greatly improved - Unsure of cause of previous headaches - May continue to take ibuprofen PRN for headaches - Drink plenty water and get plenty rest - RTC if headaches become more frequent. - RTC for wellness  visit in June 2023.   2. Herpes zoster without complication - continue to take Valtrex and Prednisone as prescribed - May use OTC lidocaine ointment for pain - RTC if needed - RTC for wellness visit in June 2023.

## 2021-05-14 ENCOUNTER — Inpatient Hospital Stay: Payer: Medicare Other | Attending: Oncology

## 2021-05-14 ENCOUNTER — Other Ambulatory Visit: Payer: Self-pay

## 2021-05-14 ENCOUNTER — Inpatient Hospital Stay (HOSPITAL_BASED_OUTPATIENT_CLINIC_OR_DEPARTMENT_OTHER): Payer: Medicare Other | Admitting: Oncology

## 2021-05-14 VITALS — BP 121/66 | HR 67 | Temp 98.1°F | Resp 20 | Ht 62.0 in | Wt 129.4 lb

## 2021-05-14 DIAGNOSIS — C8290 Follicular lymphoma, unspecified, unspecified site: Secondary | ICD-10-CM | POA: Insufficient documentation

## 2021-05-14 DIAGNOSIS — M8580 Other specified disorders of bone density and structure, unspecified site: Secondary | ICD-10-CM | POA: Diagnosis not present

## 2021-05-14 DIAGNOSIS — R21 Rash and other nonspecific skin eruption: Secondary | ICD-10-CM | POA: Insufficient documentation

## 2021-05-14 DIAGNOSIS — Z8719 Personal history of other diseases of the digestive system: Secondary | ICD-10-CM | POA: Insufficient documentation

## 2021-05-14 DIAGNOSIS — C8203 Follicular lymphoma grade I, intra-abdominal lymph nodes: Secondary | ICD-10-CM | POA: Diagnosis not present

## 2021-05-14 DIAGNOSIS — K589 Irritable bowel syndrome without diarrhea: Secondary | ICD-10-CM | POA: Diagnosis not present

## 2021-05-14 LAB — CMP (CANCER CENTER ONLY)
ALT: 19 U/L (ref 0–44)
AST: 16 U/L (ref 15–41)
Albumin: 4.4 g/dL (ref 3.5–5.0)
Alkaline Phosphatase: 56 U/L (ref 38–126)
Anion gap: 9 (ref 5–15)
BUN: 19 mg/dL (ref 8–23)
CO2: 29 mmol/L (ref 22–32)
Calcium: 11.1 mg/dL — ABNORMAL HIGH (ref 8.9–10.3)
Chloride: 102 mmol/L (ref 98–111)
Creatinine: 0.98 mg/dL (ref 0.44–1.00)
GFR, Estimated: 58 mL/min — ABNORMAL LOW (ref >60)
Glucose, Bld: 85 mg/dL (ref 70–99)
Potassium: 4.2 mmol/L (ref 3.5–5.1)
Sodium: 140 mmol/L (ref 135–145)
Total Bilirubin: 0.4 mg/dL (ref 0.3–1.2)
Total Protein: 6.6 g/dL (ref 6.5–8.1)

## 2021-05-14 LAB — CBC WITH DIFFERENTIAL (CANCER CENTER ONLY)
Abs Immature Granulocytes: 0.11 10*3/uL — ABNORMAL HIGH (ref 0.00–0.07)
Basophils Absolute: 0.1 10*3/uL (ref 0.0–0.1)
Basophils Relative: 1 %
Eosinophils Absolute: 0.1 10*3/uL (ref 0.0–0.5)
Eosinophils Relative: 1 %
HCT: 45.9 % (ref 36.0–46.0)
Hemoglobin: 15 g/dL (ref 12.0–15.0)
Immature Granulocytes: 1 %
Lymphocytes Relative: 18 %
Lymphs Abs: 2.4 10*3/uL (ref 0.7–4.0)
MCH: 30.4 pg (ref 26.0–34.0)
MCHC: 32.7 g/dL (ref 30.0–36.0)
MCV: 93.1 fL (ref 80.0–100.0)
Monocytes Absolute: 1.6 10*3/uL — ABNORMAL HIGH (ref 0.1–1.0)
Monocytes Relative: 12 %
Neutro Abs: 8.8 10*3/uL — ABNORMAL HIGH (ref 1.7–7.7)
Neutrophils Relative %: 67 %
Platelet Count: 322 10*3/uL (ref 150–400)
RBC: 4.93 MIL/uL (ref 3.87–5.11)
RDW: 14.1 % (ref 11.5–15.5)
WBC Count: 13 10*3/uL — ABNORMAL HIGH (ref 4.0–10.5)
nRBC: 0 % (ref 0.0–0.2)

## 2021-05-14 LAB — LACTATE DEHYDROGENASE: LDH: 138 U/L (ref 98–192)

## 2021-05-14 NOTE — Progress Notes (Signed)
[facility] OFFICE PROGRESS NOTE   Diagnosis: Non-Hodgkin's lymphoma  INTERVAL HISTORY:   [name] [name] returns as scheduled.  She was diagnosed with COVID-19 on 04/10/2021.  She developed a zoster rash at the right posterior lateral chest wall beginning 04/30/2021.  She completed a course of Paxlovid for the COVID-19 and Valtrex/prednisone for shingles.  She took the final dose of prednisone yesterday.  The zoster rash is healing.  She has a burning discomfort at the right posterior lateral chest. She reports a poor appetite over the past month.  She is being followed by orthopedics for left-sided "sciatica ".  Objective:  Vital signs in last 24 hours:  Blood pressure 121/66, pulse 67, temperature 98.1 F (36.7 C), temperature source Oral, resp. rate 20, height 5\' 2"  (1.575 m), weight 129 lb 6.4 oz (58.7 kg), SpO2 100 %.    Lymphatics: No cervical, supraclavicular, axillary, or inguinal nodes Resp: Lungs clear bilaterally Cardio: Regular rate and rhythm GI: No mass, mild tenderness in the left mid abdomen, no hepatosplenomegaly Vascular: No leg edema Skin: Healing zoster rash at the right mid posterior lateral chest   Lab Results:  Lab Results  Component Value Date   WBC 13.0 (H) 05/14/2021   HGB 15.0 05/14/2021   HCT 45.9 05/14/2021   MCV 93.1 05/14/2021   PLT 322 05/14/2021   NEUTROABS 8.8 (H) 05/14/2021    CMP  Lab Results  Component Value Date   NA 140 05/14/2021   K 4.2 05/14/2021   CL 102 05/14/2021   CO2 29 05/14/2021   GLUCOSE 85 05/14/2021   BUN 19 05/14/2021   CREATININE 0.98 05/14/2021   CALCIUM 11.1 (H) 05/14/2021   PROT 6.6 05/14/2021   ALBUMIN 4.4 05/14/2021   AST 16 05/14/2021   ALT 19 05/14/2021   ALKPHOS 56 05/14/2021   BILITOT 0.4 05/14/2021   GFRNONAA 58 (L) 05/14/2021   GFRAA >60 01/17/2020   Medications: I have reviewed the patient's current medications.   Assessment/Plan: Non-Hodgkin's lymphoma-follicular  lymphoma Mesenteric lymph node biopsy 2/53/6644- follicular center cell lymphoma, grade 2 Treated with mitoxantrone, fludarabine, and rituximab completed in January 2002 Progressive disease in February 2004, treated with single agent rituximab February 2004 through August 2005 CTs 06/21/2014- no evidence of lymphoma CT abdomen/pelvis 06/05/2018- enlarging left mid abdominal mesenteric mass CT biopsy of the mesenteric mass 06/16/2018-follicular lymphoma, low-grade, CD20 positive Cycle 1 weekly Rituxan 07/24/2018 Cycle 2 weekly Rituxan 07/31/2018  Cycle 3 weekly Rituxan 08/07/2018 Cycle 4 weekly Rituxan 08/14/2018 CT abdomen/pelvis 10/09/2018- reduction in size of left lower quadrant mesenteric mass Cycle 1 maintenance rituximab 10/19/2018 Cycle 2 maintenance Rituxan 12/14/2018 Cycle 3 maintenance Rituxan 02/08/2019 Cycle 4 maintenance Rituxan 04/09/2019 CT abdomen/pelvis 05/31/2019-no change in jejunal mesenteric mass, no evidence of disease progression. Cycle 5 maintenance rituximab 06/07/2019 Cycle 6 maintenance rituximab 08/02/2019 Cycle 7 maintenance rituximab 09/27/2019 CT abdomen/pelvis 11/20/2019-further contraction of the lesion at the small bowel mesentery, no evidence of progressive lymphoma Cycle 8 maintenance rituximab 11/22/2019 Cycle 9 maintenance rituximab 01/17/2020 Cycle 10 maintenance rituximab 03/13/2020 Cycle 11 maintenance rituximab 05/08/2020 Cycle 12 maintenance rituximab 07/03/2020 Cycle 13 maintenance rituximab 08/28/2020 CTs 10/28/2020-unchanged soft tissue in the left abdominal small bowel mesentery, no evidence of lymphadenopathy or recurrent disease elsewhere in the chest, abdomen, or pelvis Irritable bowel syndrome History of anal incontinence Osteopenia History of colon polyps Weakness, flushing, presyncope and chills during Rituxan infusion 07/24/2018. Mild hypercalcemia-chronic COVID-19 04/10/2021 Zoster rash, right posterior lateral chest wall 04/30/2021  Disposition: [name] [name] remains in clinical remission from [place].  I suspect the anorexia over the past month is related to recent infections.  The mild leukocytosis today is likely due to prednisone. She will return for an office visit in 3 months.  She will call in the interim for new symptoms. She has chronic mild hypercalcemia. [name] Coder, MD  05/14/2021  9:32 AM

## 2021-05-21 DIAGNOSIS — G245 Blepharospasm: Secondary | ICD-10-CM | POA: Diagnosis not present

## 2021-05-21 DIAGNOSIS — H02889 Meibomian gland dysfunction of unspecified eye, unspecified eyelid: Secondary | ICD-10-CM | POA: Diagnosis not present

## 2021-05-25 DIAGNOSIS — M5442 Lumbago with sciatica, left side: Secondary | ICD-10-CM | POA: Diagnosis not present

## 2021-05-25 DIAGNOSIS — M9905 Segmental and somatic dysfunction of pelvic region: Secondary | ICD-10-CM | POA: Diagnosis not present

## 2021-05-25 DIAGNOSIS — M9903 Segmental and somatic dysfunction of lumbar region: Secondary | ICD-10-CM | POA: Diagnosis not present

## 2021-05-25 DIAGNOSIS — M9902 Segmental and somatic dysfunction of thoracic region: Secondary | ICD-10-CM | POA: Diagnosis not present

## 2021-06-01 DIAGNOSIS — M5442 Lumbago with sciatica, left side: Secondary | ICD-10-CM | POA: Diagnosis not present

## 2021-06-01 DIAGNOSIS — M9905 Segmental and somatic dysfunction of pelvic region: Secondary | ICD-10-CM | POA: Diagnosis not present

## 2021-06-01 DIAGNOSIS — M9902 Segmental and somatic dysfunction of thoracic region: Secondary | ICD-10-CM | POA: Diagnosis not present

## 2021-06-01 DIAGNOSIS — M9903 Segmental and somatic dysfunction of lumbar region: Secondary | ICD-10-CM | POA: Diagnosis not present

## 2021-06-03 DIAGNOSIS — Z23 Encounter for immunization: Secondary | ICD-10-CM | POA: Diagnosis not present

## 2021-06-03 DIAGNOSIS — L821 Other seborrheic keratosis: Secondary | ICD-10-CM | POA: Diagnosis not present

## 2021-06-03 DIAGNOSIS — L570 Actinic keratosis: Secondary | ICD-10-CM | POA: Diagnosis not present

## 2021-06-08 DIAGNOSIS — M9905 Segmental and somatic dysfunction of pelvic region: Secondary | ICD-10-CM | POA: Diagnosis not present

## 2021-06-08 DIAGNOSIS — M9902 Segmental and somatic dysfunction of thoracic region: Secondary | ICD-10-CM | POA: Diagnosis not present

## 2021-06-08 DIAGNOSIS — M9903 Segmental and somatic dysfunction of lumbar region: Secondary | ICD-10-CM | POA: Diagnosis not present

## 2021-06-08 DIAGNOSIS — M5442 Lumbago with sciatica, left side: Secondary | ICD-10-CM | POA: Diagnosis not present

## 2021-06-11 DIAGNOSIS — M5416 Radiculopathy, lumbar region: Secondary | ICD-10-CM | POA: Diagnosis not present

## 2021-06-15 ENCOUNTER — Ambulatory Visit (INDEPENDENT_AMBULATORY_CARE_PROVIDER_SITE_OTHER): Payer: Medicare Other | Admitting: Family Medicine

## 2021-06-15 ENCOUNTER — Encounter: Payer: Self-pay | Admitting: Family Medicine

## 2021-06-15 ENCOUNTER — Other Ambulatory Visit: Payer: Self-pay

## 2021-06-15 VITALS — BP 124/70 | HR 96 | Wt 126.6 lb

## 2021-06-15 DIAGNOSIS — B0229 Other postherpetic nervous system involvement: Secondary | ICD-10-CM | POA: Insufficient documentation

## 2021-06-15 MED ORDER — GABAPENTIN 300 MG PO CAPS: ORAL_CAPSULE | ORAL | 1 refills | Status: DC

## 2021-06-15 NOTE — Assessment & Plan Note (Signed)
Treating with gabapentin.

## 2021-06-15 NOTE — Progress Notes (Signed)
Subjective:  Patient ID: [name] [name], female    DOB: 09-10-1940  Age: 81 y.o. MRN: [id number]  CC: Chief Complaint  Patient presents with   Herpes Zoster    Pt has had shingles since Dec 30. Unable to sleep, decreased appetite,fatigue. Pain is "unbearable" pt has been using Lidocaine lotion but not helping. Under right breast and right arm is where pain is mostly.     HPI:  81 year old female presents with continued pain associated with shingles.  Patient seen by me and diagnosed with shingles on 1/3.  She was treated with prednisone and Valtrex.  Rash has resolved.  However, she continues to have pain.  She states that the pain is severe and sharp.  It is quite troublesome for her and causes a significant amount of anxiety as well. Patient reports that the pain is nearly unbearable.  She is unsure what to do at this time.  She would like to discuss treatment options.  Patient Active Problem List   Diagnosis Date Noted   Post herpetic neuralgia 06/15/2021   Optic neuropathy, left 10/11/2016   Peripheral neuropathy 08/26/2016   Anal sphincter incompetence 12/31/2015   Dyssynergic defecation 12/31/2015   HTN (hypertension) 04/18/2015   NSVT (nonsustained ventricular tachycardia) 11/27/2013   Allergic rhinitis 08/20/2013   Diverticulitis of colon 01/08/2011   Hyperlipidemia 08/27/2008   MVP (mitral valve prolapse) 08/27/2008    IBS- diarrhea predominant 10/13/2007   OSTEOPENIA 06/16/2007   NHL (non-Hodgkin's lymphoma) ([place]) 06/16/2007    Social Hx   Social History   Socioeconomic History   Marital status: Widowed    Spouse name: Not on file   Number of children: 1   Years of education: Not on file   Highest education level: Not on file  Occupational History   Occupation: Medical sales representative work part time    Employer: RETIRED  Tobacco Use   Smoking status: Former    Packs/day: 0.33    Types: Cigarettes   Smokeless tobacco: Never   Tobacco comments:    quit 1976  Vaping  Use   Vaping Use: Never used  Substance and Sexual Activity   Alcohol use: Yes    Alcohol/week: 0.0 standard drinks    Comment: very rare   Drug use: No   Sexual activity: Yes    Birth control/protection: Surgical  Other Topics Concern   Not on file  Social History Narrative   Retired, widowed, 1 child   Rare EtOH, former smoker none now   No drugs   Right handed   Social Determinants of Health   Financial Resource Strain: Not on file  Food Insecurity: Not on file  Transportation Needs: Not on file  Physical Activity: Not on file  Stress: Not on file  Social Connections: Not on file    Review of Systems  Constitutional:  Positive for appetite change.  Skin:        Postherpetic neuralgia.  Psychiatric/Behavioral:  The patient is nervous/anxious.     Objective:  BP 124/70    Pulse 96    Wt 126 lb 9.6 oz (57.4 kg)    SpO2 98%    BMI 23.16 kg/m   BP/Weight 06/15/2021 5/45/6256 07/09/9371  Systolic BP 428 768 115  Diastolic BP 70 66 70  Wt. (Lbs) 126.6 129.4 132  BMI 23.16 23.67 24.14    Physical Exam Vitals and nursing note reviewed.  Constitutional:      General: She is not in acute distress.  Appearance: Normal appearance. She is not ill-appearing.  HENT:     Head: Normocephalic and atraumatic.  Eyes:     General:        Right eye: No discharge.        Left eye: No discharge.     Conjunctiva/sclera: Conjunctivae normal.  Skin:    Comments: No vesicular rash noted.  Neurological:     Mental Status: She is alert.  Psychiatric:        Mood and Affect: Mood normal.        Behavior: Behavior normal.    Lab Results  Component Value Date   WBC 13.0 (H) 05/14/2021   HGB 15.0 05/14/2021   HCT 45.9 05/14/2021   PLT 322 05/14/2021   GLUCOSE 85 05/14/2021   CHOL 135 07/23/2020   TRIG 52 07/23/2020   HDL 60 07/23/2020   LDLCALC 63 07/23/2020   ALT 19 05/14/2021   AST 16 05/14/2021   NA 140 05/14/2021   K 4.2 05/14/2021   CL 102 05/14/2021   CREATININE  0.98 05/14/2021   BUN 19 05/14/2021   CO2 29 05/14/2021   TSH 3.110 06/19/2019   INR 0.94 06/16/2018   HGBA1C 5.3 10/05/2019     Assessment & Plan:   Problem List Items Addressed This Visit       Nervous and Auditory   Post herpetic neuralgia - Primary    Treating with gabapentin.       Meds ordered this encounter  Medications   gabapentin (NEURONTIN) 300 MG capsule    Sig: 300 mg once on day 1, then 300 mg twice daily on day 2, 300 mg three times daily on day 3. Day 4 and thereafter, continue 300 mg three times daily.    Dispense:  270 capsule    Refill:  [place]

## 2021-06-15 NOTE — Patient Instructions (Signed)
Gabapentin as prescribed.  Call with concerns.  Take care  Dr. Lacinda Axon

## 2021-06-25 DIAGNOSIS — M5451 Vertebrogenic low back pain: Secondary | ICD-10-CM | POA: Diagnosis not present

## 2021-07-13 ENCOUNTER — Ambulatory Visit (INDEPENDENT_AMBULATORY_CARE_PROVIDER_SITE_OTHER): Payer: Medicare Other | Admitting: Internal Medicine

## 2021-07-13 ENCOUNTER — Encounter: Payer: Self-pay | Admitting: Internal Medicine

## 2021-07-13 VITALS — BP 112/74 | HR 60 | Ht 62.0 in | Wt 128.2 lb

## 2021-07-13 DIAGNOSIS — R1319 Other dysphagia: Secondary | ICD-10-CM

## 2021-07-13 DIAGNOSIS — B0229 Other postherpetic nervous system involvement: Secondary | ICD-10-CM

## 2021-07-13 DIAGNOSIS — K222 Esophageal obstruction: Secondary | ICD-10-CM

## 2021-07-13 DIAGNOSIS — K219 Gastro-esophageal reflux disease without esophagitis: Secondary | ICD-10-CM | POA: Diagnosis not present

## 2021-07-13 MED ORDER — PANTOPRAZOLE SODIUM 40 MG PO TBEC: 40.0000 mg | DELAYED_RELEASE_TABLET | Freq: Every day | ORAL | 3 refills | Status: DC

## 2021-07-13 NOTE — Patient Instructions (Signed)
We have sent the following medications to your pharmacy for you to pick up at your convenience: ?Pantoprazole ? ?If after getting back on your pantoprazole your dysphagia doesn't resolve call us back. ? ? ?I appreciate the opportunity to care for you. ?Silvano Rusk, MD, [facility] ?

## 2021-07-13 NOTE — Progress Notes (Signed)
? ?[name] [name] 81 y.o. Apr 20, 1941 [id number] ? ?Assessment & Plan:  ? ?Encounter Diagnoses  ?Name Primary?  ? GERD with stricture Yes  ? Esophageal dysphagia   ? Post herpetic neuralgia   ? ? ?She will resume her pantoprazole on a daily basis.  I explained that the advisory on gabapentin was to not take it with antacids that contain magnesium or aluminum.  That is not the case with this medication.  Should she have persistent dysphagia she will let me know and we would most likely schedule an upper endoscopy with repeat dilation. ? ?CC: [name] Spikes, DO ? ? ?Subjective:  ? ?Chief Complaint: Reflux, refill, dysphagia ? ?HPI ?81 year old white woman with a history of GERD and stricture dilation (last 2020) who was started on gabapentin because of postherpetic neuralgia.  She saw that she was not supposed to take antacids with that medication and she has been intermittently dosing her pantoprazole.  Lately she is having at least a couple of episodes of dysphagia.  I gather its been a while since she has had problems based upon the interview today that were not certain.  She had a problem last week and a minor problem today with solid food dysphagia.  She waited and drank water and it passed.  There was some fairly intense pain and pressure with that. ? ?She is not describing heartburn problems. ? ? ?06/2018 ?Benign-appearing esophageal stenosis. Dilated. ?- Small hiatal hernia. ?- The examination was otherwise normal. ?- No specimens collected. ? ? 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 Benay [name]. ?Allergies  ?Allergen Reactions  ? Other Anaphylaxis  ?  Other reaction(s): Facial swelling ?And face swelling ?And face swelling ?  ? Sulfa Antibiotics Swelling and Anaphylaxis  ?  And face swelling ?Face swelling ?  ? Sulfonamide Derivatives Swelling  ?  Face swelling ?  ? Glycopyrrolate Other (See Comments)   ?  Unable to urinate ?Could not urinate after it was given  ? Latex Other (See Comments)  ?  Blisters  ? Methscopolamine Other (See Comments)  ?  Unable to urinate ?Unable to urinate  ? Ace Inhibitors Hives  ? Codeine Nausea Only  ? Iodine Hives  ? Iohexol Hives  ?   Desc: 50 mg benadryl prior to exam ?  ? ?Current Meds  ?Medication Sig  ? ALPRAZolam (XANAX) 0.25 MG tablet Take 1 tablet (0.25 mg total) by mouth daily as needed for anxiety.  ? atenolol (TENORMIN) 25 MG tablet TAKE 1.5 TABLETS (37.'5MG'$  TOTAL) BY MOUTHDAILY.  ? fexofenadine (ALLEGRA) 180 MG tablet Take 180 mg by mouth daily as needed for allergies.  ? gabapentin (NEURONTIN) 300 MG capsule 300 mg once on day 1, then 300 mg twice daily on day 2, 300 mg three times daily on day 3. Day 4 and thereafter, continue 300 mg three times daily.  ? pantoprazole (PROTONIX) 40 MG tablet TAKE 1 TABLET BY MOUTH ONCE DAILY BEFORE BREAKFAST  ? Polyvinyl Alcohol-Povidone (REFRESH OP) Apply 1 drop to eye 3 (three) times daily.  ? Probiotic Product (ALIGN PO) Take 1 capsule by mouth daily. Alternates between [name] [name] per PCP orders every few months  ? rosuvastatin (CRESTOR) 5 MG tablet Mon, Wed and Fri only.  ? TURMERIC PO Take 1 capsule by mouth daily.   ? UNABLE TO FIND Apply topically.  ? Wheat Dextrin (BENEFIBER)  POWD Take 1 Dose by mouth daily.  ? Zinc Sulfate (ZINC 15 PO) Take 15 mg by mouth.  ? ?Past Medical History:  ?Diagnosis Date  ? Anal sphincter incompetence 12/31/2015  ? Manometry 12/2015  ? Arthritis   ? Brady-tachy syndrome ([place])   ? Chest pain 10/16/13  ? normal coronary arteries on cath and normal EF  ? Colitis, ischemic ([place])   ? Colon polyp 07/29/1993  ? with focal adenomatous changes  ? Diverticulitis   ? Diverticulosis of colon   ? Dyspepsia   ? Dyssynergic defecation 12/31/2015  ? Anorectal mano 12/2015, also has rectal hypersensitivity  ? Fatty liver   ? GERD (gastroesophageal reflux disease)   ? Heart murmur   ? HLD (hyperlipidemia)    ? Hypertension   ? IBS (irritable bowel syndrome)   ? Incontinence, feces   ? MVP (mitral valve prolapse)   ? nhl dx'd 07/1999/ 06/2018  ? chemo comp 2001; rituxin comp 2005  ? Osteopenia   ? Palpitation   ? Rectocele   ? Rectocele   ? Vasovagal syncope   ? ?Past Surgical History:  ?Procedure Laterality Date  ? ABDOMINAL HYSTERECTOMY    ? ABDOMINAL SURGERY  2001   ? for non hodgkins lymphoma; lymphoma in mesentary  ? ANAL RECTAL MANOMETRY N/A 12/24/2015  ? Procedure: ANO RECTAL MANOMETRY;  Surgeon: [name] Mayer, MD;  Location: [facility];  Service: Endoscopy;  Laterality: N/A;  ? BLADDER SUSPENSION    ? BUNIONECTOMY    ? CHOLECYSTECTOMY  2003  ? COLONOSCOPY  7/200/, 11/2007  ? diverticulosis  ? FINE NEEDLE ASPIRATION BIOPSY Left 06/16/2018  ? left mesentery tissue  ? LEFT HEART CATHETERIZATION WITH CORONARY ANGIOGRAM N/A 10/16/2013  ? Procedure: LEFT HEART CATHETERIZATION WITH CORONARY ANGIOGRAM;  Surgeon: [name] M Martinique, MD;  Location: [facility] CATH LAB;  Service: Cardiovascular;  Laterality: N/A;  ? pelvic prolapse repair    ? UPPER GASTROINTESTINAL ENDOSCOPY  10/2005  ? hiatal hernia  ? ?Social History  ? ?Social History Narrative  ? Retired, widowed, 1 child  ? Rare EtOH, former smoker none now  ? No drugs  ? Right handed  ? ?family history includes Colon cancer (age of onset: 72) in her father; Colon polyps in her brother and brother; Prostate cancer in her brother; Stomach cancer in her maternal grandfather and maternal uncle. ? ? ?Review of Systems ? ?See HPI ?Objective:  ? Physical Exam ?BP 112/74   Pulse 60   Ht '5\' 2"'$  (1.575 m)   Wt 128 lb 3.2 oz (58.2 kg)   BMI 23.45 kg/m?  ?Elderly white woman no acute distress the neck is supple without thyromegaly or mass ? ?

## 2021-07-14 DIAGNOSIS — M5416 Radiculopathy, lumbar region: Secondary | ICD-10-CM | POA: Diagnosis not present

## 2021-07-20 NOTE — Progress Notes (Deleted)
?Cardiology Office Note:   ? ?Date:  07/20/2021  ? ?ID:  [name] [name], DOB 09-22-1940, MRN [id number] ? ?PCP:  [name], DO ?  ?[facility] Providers ?Cardiologist:  Ena Dawley, MD { ? ? ?Referring MD: [name], DO  ? ? ?History of Present Illness:   ? ?[name] [name] is a 81 y.o. female with a hx of MVP, NSVT, HLD, HTN, and non Hodgkin's lymphoma on chronic Rituximab who was previously followed by Dr. Meda Coffee who now returns to clinic for follow-up. ? ?Cath 2015 with normal coronary arteries. Holter with NSVT on atenolol. Cardiac MRI normal.  ? ?Last seen in clinic on 10/2020 where she was dealing with neuropathy but was doing well from a CV standpoint.  ? ?Today, *** ? ?Past Medical History:  ?Diagnosis Date  ? Anal sphincter incompetence 12/31/2015  ? Manometry 12/2015  ? Arthritis   ? Brady-tachy syndrome ([place])   ? Chest pain 10/16/13  ? normal coronary arteries on cath and normal EF  ? Colitis, ischemic ([place])   ? Colon polyp 07/29/1993  ? with focal adenomatous changes  ? Diverticulitis   ? Diverticulosis of colon   ? Dyspepsia   ? Dyssynergic defecation 12/31/2015  ? Anorectal mano 12/2015, also has rectal hypersensitivity  ? Fatty liver   ? GERD (gastroesophageal reflux disease)   ? Heart murmur   ? HLD (hyperlipidemia)   ? Hypertension   ? IBS (irritable bowel syndrome)   ? Incontinence, feces   ? MVP (mitral valve prolapse)   ? nhl dx'd 07/1999/ 06/2018  ? chemo comp 2001; rituxin comp 2005  ? Osteopenia   ? Palpitation   ? Rectocele   ? Rectocele   ? Vasovagal syncope   ? ? ?Past Surgical History:  ?Procedure Laterality Date  ? ABDOMINAL HYSTERECTOMY    ? ABDOMINAL SURGERY  2001   ? for non hodgkins lymphoma; lymphoma in mesentary  ? ANAL RECTAL MANOMETRY N/A 12/24/2015  ? Procedure: ANO RECTAL MANOMETRY;  Surgeon: Gatha Mayer, MD;  Location: [facility];  Service: Endoscopy;  Laterality: N/A;  ? BLADDER SUSPENSION    ? BUNIONECTOMY    ? CHOLECYSTECTOMY  2003  ? COLONOSCOPY  7/200/, 11/2007   ? diverticulosis  ? FINE NEEDLE ASPIRATION BIOPSY Left 06/16/2018  ? left mesentery tissue  ? LEFT HEART CATHETERIZATION WITH CORONARY ANGIOGRAM N/A 10/16/2013  ? Procedure: LEFT HEART CATHETERIZATION WITH CORONARY ANGIOGRAM;  Surgeon: Peter M Martinique, MD;  Location: [facility] CATH LAB;  Service: Cardiovascular;  Laterality: N/A;  ? pelvic prolapse repair    ? UPPER GASTROINTESTINAL ENDOSCOPY  10/2005  ? hiatal hernia  ? ? ?Current Medications: ?No outpatient medications have been marked as taking for the 07/23/21 encounter (Appointment) with Freada Bergeron, MD.  ?  ? ?Allergies:   Other, Sulfa antibiotics, Sulfonamide derivatives, Glycopyrrolate, Latex, Methscopolamine, Ace inhibitors, Codeine, Iodine, and Iohexol  ? ?Social History  ? ?Socioeconomic History  ? Marital status: Widowed  ?  Spouse name: Not on file  ? Number of children: 1  ? Years of education: Not on file  ? Highest education level: Not on file  ?Occupational History  ? Occupation: Medical sales representative work part time  ?  Employer: RETIRED  ?Tobacco Use  ? Smoking status: Former  ?  Packs/day: 0.33  ?  Types: Cigarettes  ? Smokeless tobacco: Never  ? Tobacco comments:  ?  quit 1976  ?Vaping Use  ? Vaping Use: Never used  ?Substance  and Sexual Activity  ? Alcohol use: Yes  ?  Alcohol/week: 0.0 standard drinks  ?  Comment: very rare  ? Drug use: No  ? Sexual activity: Yes  ?  Birth control/protection: Surgical  ?Other Topics Concern  ? Not on file  ?Social History Narrative  ? Retired, widowed, 1 child  ? Rare EtOH, former smoker none now  ? No drugs  ? Right handed  ? ?Social Determinants of Health  ? ?Financial Resource Strain: Not on file  ?Food Insecurity: Not on file  ?Transportation Needs: Not on file  ?Physical Activity: Not on file  ?Stress: Not on file  ?Social Connections: Not on file  ?  ? ?Family History: ?The patient's family history includes Colon cancer (age of onset: 15) in her father; Colon polyps in her brother and brother; Prostate cancer in her  brother; Stomach cancer in her maternal grandfather and maternal uncle. There is no history of Esophageal cancer, Pancreatic cancer, Liver disease, or Breast cancer. ? ?ROS:   ?Review of Systems  ?Constitutional:  Negative for diaphoresis, fever and malaise/fatigue.  ?HENT:  Negative for congestion, ear pain and hearing loss.   ?Eyes:  Negative for blurred vision and pain.  ?Respiratory:  Positive for shortness of breath. Negative for cough and wheezing.   ?Cardiovascular:  Positive for leg swelling. Negative for chest pain, palpitations, orthopnea, claudication and PND.  ?Gastrointestinal:  Negative for abdominal pain, blood in stool, constipation and diarrhea.  ?Genitourinary:  Negative for dysuria and hematuria.  ?Musculoskeletal:  Negative for back pain, joint pain, myalgias and neck pain.  ?Neurological:  Negative for dizziness, tremors and headaches.  ?Psychiatric/Behavioral:  Negative for depression. The patient does not have insomnia.   ? ? ?EKGs/Labs/Other Studies Reviewed:   ? ?The following studies were reviewed today: ?TTE 10/17/18 ?IMPRESSIONS  ? 1. The left ventricle has normal systolic function, with an ejection  ?fraction of 55-60%. The cavity size was normal. Left ventricular diastolic  ?Doppler parameters are consistent with impaired relaxation.  ? 2. The right ventricle has normal systolic function. The cavity was  ?normal. There is no increase in right ventricular wall thickness.  ? 3. Borderline bileaflet prolapse of the mitral valve leaflets with  ?trivial central mitral regurgitation.  ? 4. The aortic valve is tricuspid. Mild sclerosis of the aortic valve. No  ?stenosis of the aortic valve.  ? 5. The aortic root and ascending aorta are normal in size and structure. ? ?30-day monitor 03/06/2018 sinus bradycardia to sinus tachycardia with 3 episodes of NSVT: 2 5 beats and one 6 beat ?  ?Cardiac MRI 03/2018 ?IMPRESSION: ?1. Normal left ventricular size, thickness and systolic function ?(LVEF  =65%). There is paradoxical septal motion. There is no late ?gadolinium enhancement in the left ventricular myocardium. ?  ?2. Normal right ventricular size, thickness and systolic function ?(LVEF = 63%). There are no regional wall motion abnormalities. ?  ?3.  Normal left and right atrial size. ?  ?4. Normal size of the aortic root, ascending aorta and pulmonary ?artery. ?  ?5. Anterior mitral valve leaflet prolapse with mitral regurgitation. ?Mild tricuspid regurgitation. ?  ?6.  Normal pericardium.  No pericardial effusion. ?  ?Normal cardiac MRI without evidence for inflammatory cardiomyopathy ?or ARVC.  ?  ?Electronically Signed ?  By: Ena Dawley ?  On: 03/28/2018 19:07 ?  ?  ?CARDIAC CATH Final Conclusions 10/2013:   ?1. Normal coronary anatomy ?2. Normal LV function ?  ?Recommendations: medical therapy. ? ?EKG:   ?  11/13/2020: no ekg ordered today. ? ?Recent Labs: ?05/14/2021: ALT 19; BUN 19; Creatinine 0.98; Hemoglobin 15.0; Platelet Count 322; Potassium 4.2; Sodium 140  ?Recent Lipid Panel ?   ?Component Value Date/Time  ? CHOL 135 07/23/2020 0757  ? TRIG 52 07/23/2020 0757  ? HDL 60 07/23/2020 0757  ? CHOLHDL 2.3 07/23/2020 0757  ? CHOLHDL 4.6 02/06/2013 0806  ? VLDL 33 02/06/2013 0806  ? Liborio Negron Torres 63 07/23/2020 0757  ? ? ? ?Risk Assessment/Calculations:   ?  ? ?    ? ?Physical Exam:   ? ?VS:  There were no vitals taken for this visit.   ? ?Wt Readings from Last 3 Encounters:  ?07/13/21 128 lb 3.2 oz (58.2 kg)  ?06/15/21 126 lb 9.6 oz (57.4 kg)  ?05/14/21 129 lb 6.4 oz (58.7 kg)  ?  ? ?GEN:  Well nourished, well developed in no acute distress ?HEENT: Normal ?NECK: No JVD; No carotid bruits ?LYMPHATICS: No lymphadenopathy ?CARDIAC: RRR, no murmurs, rubs, gallops ?RESPIRATORY:  Clear to auscultation without rales, wheezing or rhonchi  ?ABDOMEN: Soft, non-tender, non-distended ?MUSCULOSKELETAL:  No edema; No deformity  ?SKIN: Warm and dry ?NEUROLOGIC:  Alert and oriented x 3 ?PSYCHIATRIC:  Normal affect   ? ?ASSESSMENT:   ? ?No diagnosis found. ? ?PLAN:   ? ?In order of problems listed above: ? ?#Palpitations: ?#History of NSVT: ?Normal cath in 2015 and cardiac MRI 2019 normal. Overall well controlled on the at

## 2021-07-22 NOTE — Progress Notes (Signed)
?Cardiology Office Note:   ? ?Date:  07/23/2021  ? ?ID:  [name] [name], DOB 14-Mar-1941, MRN [id number] ? ?PCP:  [name], DO ?  ?[facility] Providers ?Cardiologist:  Ena Dawley, MD { ? ? ?Referring MD: [name], DO  ? ? ?History of Present Illness:   ? ?[name] [name] is a 81 y.o. female with a hx of MVP, NSVT, HLD, HTN, and non Hodgkin's lymphoma on chronic Rituximab who was previously followed by Dr. Meda Coffee who now returns to clinic for follow-up. ? ?Cath 2015 with normal coronary arteries. Holter with NSVT on atenolol. Cardiac MRI normal.  ? ?Last seen in clinic on 10/2020 where she was dealing with neuropathy but was doing well from a CV standpoint.  ? ?Today, she is doing well. She is recovering from a flare-up of shingles and neuralgia. The flare-up occurred across her chest beneath her breasts. She wants to receive the Shingles vaccine but is hesitant because of her history of lymphoma. From a CV standpoint, she is doing well.  ? ?Occasionally, especially in the cold weather, her bilateral fingers will turn white and tingle. She endorses arthritis. However, she denies pain in her fingers.  ? ?She is compliant and tolerating her medications. She has started walking again after recovering from sciatic nerve pain. She denies chest pain, chest pressure, dyspnea at rest or with exertion, palpitations, PND, orthopnea, or leg swelling. ? ?Past Medical History:  ?Diagnosis Date  ? Anal sphincter incompetence 12/31/2015  ? Manometry 12/2015  ? Arthritis   ? Brady-tachy syndrome ([place])   ? Chest pain 10/16/13  ? normal coronary arteries on cath and normal EF  ? Colitis, ischemic ([place])   ? Colon polyp 07/29/1993  ? with focal adenomatous changes  ? Diverticulitis   ? Diverticulosis of colon   ? Dyspepsia   ? Dyssynergic defecation 12/31/2015  ? Anorectal mano 12/2015, also has rectal hypersensitivity  ? Fatty liver   ? GERD (gastroesophageal reflux disease)   ? Heart murmur   ? HLD (hyperlipidemia)   ?  Hypertension   ? IBS (irritable bowel syndrome)   ? Incontinence, feces   ? MVP (mitral valve prolapse)   ? nhl dx'd 07/1999/ 06/2018  ? chemo comp 2001; rituxin comp 2005  ? Osteopenia   ? Palpitation   ? Rectocele   ? Rectocele   ? Vasovagal syncope   ? ? ?Past Surgical History:  ?Procedure Laterality Date  ? ABDOMINAL HYSTERECTOMY    ? ABDOMINAL SURGERY  2001   ? for non hodgkins lymphoma; lymphoma in mesentary  ? ANAL RECTAL MANOMETRY N/A 12/24/2015  ? Procedure: ANO RECTAL MANOMETRY;  Surgeon: Gatha Mayer, MD;  Location: [facility];  Service: Endoscopy;  Laterality: N/A;  ? BLADDER SUSPENSION    ? BUNIONECTOMY    ? CHOLECYSTECTOMY  2003  ? COLONOSCOPY  7/200/, 11/2007  ? diverticulosis  ? FINE NEEDLE ASPIRATION BIOPSY Left 06/16/2018  ? left mesentery tissue  ? LEFT HEART CATHETERIZATION WITH CORONARY ANGIOGRAM N/A 10/16/2013  ? Procedure: LEFT HEART CATHETERIZATION WITH CORONARY ANGIOGRAM;  Surgeon: Peter M Martinique, MD;  Location: [facility] CATH LAB;  Service: Cardiovascular;  Laterality: N/A;  ? pelvic prolapse repair    ? UPPER GASTROINTESTINAL ENDOSCOPY  10/2005  ? hiatal hernia  ? ? ?Current Medications: ?Current Meds  ?Medication Sig  ? ALPRAZolam (XANAX) 0.25 MG tablet Take 1 tablet (0.25 mg total) by mouth daily as needed for anxiety.  ? atenolol (TENORMIN) 25  MG tablet TAKE 1.5 TABLETS (37.'5MG'$  TOTAL) BY MOUTHDAILY.  ? fexofenadine (ALLEGRA) 180 MG tablet Take 180 mg by mouth daily as needed for allergies.  ? gabapentin (NEURONTIN) 300 MG capsule 300 mg once on day 1, then 300 mg twice daily on day 2, 300 mg three times daily on day 3. Day 4 and thereafter, continue 300 mg three times daily.  ? pantoprazole (PROTONIX) 40 MG tablet Take 1 tablet (40 mg total) by mouth daily before breakfast.  ? Polyvinyl Alcohol-Povidone (REFRESH OP) Apply 1 drop to eye 3 (three) times daily.  ? Probiotic Product (ALIGN PO) Take 1 capsule by mouth daily. Alternates between [place] per PCP orders every few  months  ? rosuvastatin (CRESTOR) 5 MG tablet Mon, Wed and Fri only.  ? TURMERIC PO Take 1 capsule by mouth daily.   ? UNABLE TO FIND Apply topically.  ? Wheat Dextrin (BENEFIBER) POWD Take 1 Dose by mouth daily.  ? Zinc Sulfate (ZINC 15 PO) Take 15 mg by mouth.  ?  ? ?Allergies:   Other, Sulfa antibiotics, Sulfonamide derivatives, Glycopyrrolate, Latex, Methscopolamine, Ace inhibitors, Codeine, Iodine, and Iohexol  ? ?Social History  ? ?Socioeconomic History  ? Marital status: Widowed  ?  Spouse name: Not on file  ? Number of children: 1  ? Years of education: Not on file  ? Highest education level: Not on file  ?Occupational History  ? Occupation: Medical sales representative work part time  ?  Employer: RETIRED  ?Tobacco Use  ? Smoking status: Former  ?  Packs/day: 0.33  ?  Types: Cigarettes  ? Smokeless tobacco: Never  ? Tobacco comments:  ?  quit 1976  ?Vaping Use  ? Vaping Use: Never used  ?Substance and Sexual Activity  ? Alcohol use: Yes  ?  Alcohol/week: 0.0 standard drinks  ?  Comment: very rare  ? Drug use: No  ? Sexual activity: Yes  ?  Birth control/protection: Surgical  ?Other Topics Concern  ? Not on file  ?Social History Narrative  ? Retired, widowed, 1 child  ? Rare EtOH, former smoker none now  ? No drugs  ? Right handed  ? ?Social Determinants of Health  ? ?Financial Resource Strain: Not on file  ?Food Insecurity: Not on file  ?Transportation Needs: Not on file  ?Physical Activity: Not on file  ?Stress: Not on file  ?Social Connections: Not on file  ?  ? ?Family History: ?The patient's family history includes Colon cancer (age of onset: 33) in her father; Colon polyps in her brother and brother; Prostate cancer in her brother; Stomach cancer in her maternal grandfather and maternal uncle. There is no history of Esophageal cancer, Pancreatic cancer, Liver disease, or Breast cancer. ? ?ROS:   ?Review of Systems  ?Constitutional:  Negative for diaphoresis, fever and malaise/fatigue.  ?HENT:  Negative for congestion, ear  pain and hearing loss.   ?Eyes:  Negative for blurred vision and pain.  ?Respiratory:  Negative for cough, shortness of breath and wheezing.   ?Cardiovascular:  Negative for chest pain, palpitations, orthopnea, claudication, leg swelling and PND.  ?Gastrointestinal:  Negative for abdominal pain, blood in stool, constipation and diarrhea.  ?Genitourinary:  Negative for dysuria and hematuria.  ?Musculoskeletal:  Positive for myalgias. Negative for falls.  ?Neurological:  Positive for tingling (bilateral fingers) and sensory change (bilateral fingers). Negative for dizziness, tremors and headaches.  ?Endo/Heme/Allergies:  Does not bruise/bleed easily.  ?Psychiatric/Behavioral:  Negative for depression. The patient does not have  insomnia.   ? ? ?EKGs/Labs/Other Studies Reviewed:   ? ?The following studies were reviewed today: ?TTTE 10/17/18 ?IMPRESSIONS  ? 1. The left ventricle has normal systolic function, with an ejection fraction of 55-60%. The cavity size was normal. Left ventricular diastolic Doppler parameters are consistent with impaired relaxation.  ? 2. The right ventricle has normal systolic function. The cavity was normal. There is no increase in right ventricular wall thickness.  ? 3. Borderline bileaflet prolapse of the mitral valve leaflets with trivial central mitral regurgitation.  ? 4. The aortic valve is tricuspid. Mild sclerosis of the aortic valve. No stenosis of the aortic valve.  ? 5. The aortic root and ascending aorta are normal in size and structure. ? ?30-day monitor 03/06/2018 sinus bradycardia to sinus tachycardia with 3 episodes of NSVT: 2 5 beats and one 6 beat ?  ?Cardiac MRI 03/2018 ?IMPRESSION: ?1. Normal left ventricular size, thickness and systolic function ?(LVEF =65%). There is paradoxical septal motion. There is no late ?gadolinium enhancement in the left ventricular myocardium. ?  ?2. Normal right ventricular size, thickness and systolic function ?(LVEF = 63%). There are no regional  wall motion abnormalities. ?  ?3.  Normal left and right atrial size. ?  ?4. Normal size of the aortic root, ascending aorta and pulmonary ?artery. ?  ?5. Anterior mitral valve leaflet prolapse with mit

## 2021-07-23 ENCOUNTER — Ambulatory Visit (INDEPENDENT_AMBULATORY_CARE_PROVIDER_SITE_OTHER): Payer: Medicare Other | Admitting: Cardiology

## 2021-07-23 ENCOUNTER — Encounter: Payer: Self-pay | Admitting: Cardiology

## 2021-07-23 ENCOUNTER — Other Ambulatory Visit: Payer: Self-pay

## 2021-07-23 VITALS — BP 118/74 | HR 74 | Ht 62.0 in | Wt 130.0 lb

## 2021-07-23 DIAGNOSIS — I4729 Other ventricular tachycardia: Secondary | ICD-10-CM | POA: Diagnosis not present

## 2021-07-23 DIAGNOSIS — I10 Essential (primary) hypertension: Secondary | ICD-10-CM | POA: Diagnosis not present

## 2021-07-23 DIAGNOSIS — R002 Palpitations: Secondary | ICD-10-CM | POA: Diagnosis not present

## 2021-07-23 DIAGNOSIS — I341 Nonrheumatic mitral (valve) prolapse: Secondary | ICD-10-CM | POA: Diagnosis not present

## 2021-07-23 DIAGNOSIS — E782 Mixed hyperlipidemia: Secondary | ICD-10-CM

## 2021-07-23 NOTE — Patient Instructions (Signed)
Medication Instructions:  ? ?Your physician recommends that you continue on your current medications as directed. Please refer to the Current Medication list given to you today. ? ?*If you need a refill on your cardiac medications before your next appointment, please call your pharmacy* ? ? ?Lab Work: ? ?NEXT WEEK HERE IN THE OFFICE--LIPIDS--PLEASE COME FASTING TO THIS LAB APPOINTMENT ? ?If you have labs (blood work) drawn today and your tests are completely normal, you will receive your results only by: ?MyChart Message (if you have MyChart) OR ?A paper copy in the mail ?If you have any lab test that is abnormal or we need to change your treatment, we will call you to review the results. ? ? ?Follow-Up: ?At [facility], you and your health needs are our priority.  As part of our continuing mission to provide you with exceptional heart care, we have created designated Provider Care Teams.  These Care Teams include your primary Cardiologist (physician) and Advanced Practice Providers (APPs -  Physician Assistants and Nurse Practitioners) who all work together to provide you with the care you need, when you need it. ? ?We recommend signing up for the patient portal called "MyChart".  Sign up information is provided on this After Visit Summary.  MyChart is used to connect with patients for Virtual Visits (Telemedicine).  Patients are able to view lab/test results, encounter notes, upcoming appointments, etc.  Non-urgent messages can be sent to your provider as well.   ?To learn more about what you can do with MyChart, go to [email or web address].   ? ?Your next appointment:   ?6 month(s) ? ?The format for your next appointment:   ?In Person ? ?Provider:   ?DR. PEMBERTON  ? ?

## 2021-07-28 DIAGNOSIS — M5451 Vertebrogenic low back pain: Secondary | ICD-10-CM | POA: Diagnosis not present

## 2021-07-29 ENCOUNTER — Other Ambulatory Visit: Payer: Self-pay

## 2021-07-29 ENCOUNTER — Encounter: Payer: Self-pay | Admitting: Family Medicine

## 2021-07-29 ENCOUNTER — Other Ambulatory Visit: Payer: Medicare Other | Admitting: *Deleted

## 2021-07-29 ENCOUNTER — Ambulatory Visit (INDEPENDENT_AMBULATORY_CARE_PROVIDER_SITE_OTHER): Payer: Medicare Other | Admitting: Family Medicine

## 2021-07-29 VITALS — BP 130/70 | HR 70 | Temp 98.2°F | Wt 128.4 lb

## 2021-07-29 DIAGNOSIS — E782 Mixed hyperlipidemia: Secondary | ICD-10-CM | POA: Diagnosis not present

## 2021-07-29 DIAGNOSIS — M898X1 Other specified disorders of bone, shoulder: Secondary | ICD-10-CM | POA: Insufficient documentation

## 2021-07-29 DIAGNOSIS — J209 Acute bronchitis, unspecified: Secondary | ICD-10-CM | POA: Insufficient documentation

## 2021-07-29 MED ORDER — AZITHROMYCIN 250 MG PO TABS: ORAL_TABLET | ORAL | 0 refills | Status: DC

## 2021-07-29 MED ORDER — BACLOFEN 10 MG PO TABS: 5.0000 mg | ORAL_TABLET | Freq: Two times a day (BID) | ORAL | 0 refills | Status: DC | PRN

## 2021-07-29 NOTE — Assessment & Plan Note (Signed)
Advised heat.  Baclofen as directed. ?

## 2021-07-29 NOTE — Assessment & Plan Note (Signed)
Treating with azithromycin. ?

## 2021-07-29 NOTE — Patient Instructions (Signed)
Medication as prescribed. ° °Call with concerns. ° °Take care ° °Dr Lonza Shimabukuro °

## 2021-07-29 NOTE — Progress Notes (Signed)
? ?Subjective:  ?Patient ID: [name] [name], female    DOB: 03-Oct-1940  Age: 81 y.o. MRN: [id number] ? ?CC: ?Chief Complaint  ?Patient presents with  ? Cough  ?  Hacking cough-productive with yellow mucus; pain under right shoulder-going on about one week   ? ? ?HPI: ? ?81 year old female presents for evaluation the above. ? ?Patient states that she has had an ongoing productive cough over the past week.  Productive of discolored sputum.  She reports that she has also had pain around the right scapula.  She is unsure if this is coming from the cough or whether it is a separate issue.  Achy feeling.  Worse with certain ranges of motion/movements.  No relieving factors.  No fever.  No shortness of breath.  No other complaints. ? ?Patient Active Problem List  ? Diagnosis Date Noted  ? Acute bronchitis 07/29/2021  ? Periscapular pain 07/29/2021  ? Post herpetic neuralgia 06/15/2021  ? Optic neuropathy, left 10/11/2016  ? Peripheral neuropathy 08/26/2016  ? Anal sphincter incompetence 12/31/2015  ? Dyssynergic defecation 12/31/2015  ? HTN (hypertension) 04/18/2015  ? NSVT (nonsustained ventricular tachycardia) 11/27/2013  ? Allergic rhinitis 08/20/2013  ? Hyperlipidemia 08/27/2008  ? MVP (mitral valve prolapse) 08/27/2008  ?  IBS- diarrhea predominant 10/13/2007  ? OSTEOPENIA 06/16/2007  ? NHL (non-Hodgkin's lymphoma) ([place]) 06/16/2007  ? ? ?Social Hx   ?Social History  ? ?Socioeconomic History  ? Marital status: Widowed  ?  Spouse name: Not on file  ? Number of children: 1  ? Years of education: Not on file  ? Highest education level: Not on file  ?Occupational History  ? Occupation: Medical sales representative work part time  ?  Employer: RETIRED  ?Tobacco Use  ? Smoking status: Former  ?  Packs/day: 0.33  ?  Types: Cigarettes  ? Smokeless tobacco: Never  ? Tobacco comments:  ?  quit 1976  ?Vaping Use  ? Vaping Use: Never used  ?Substance and Sexual Activity  ? Alcohol use: Yes  ?  Alcohol/week: 0.0 standard drinks  ?  Comment: very rare   ? Drug use: No  ? Sexual activity: Yes  ?  Birth control/protection: Surgical  ?Other Topics Concern  ? Not on file  ?Social History Narrative  ? Retired, widowed, 1 child  ? Rare EtOH, former smoker none now  ? No drugs  ? Right handed  ? ?Social Determinants of Health  ? ?Financial Resource Strain: Not on file  ?Food Insecurity: Not on file  ?Transportation Needs: Not on file  ?Physical Activity: Not on file  ?Stress: Not on file  ?Social Connections: Not on file  ? ? ?Review of Systems ?Per HPI ? ?Objective:  ?BP 130/70   Pulse 70   Temp 98.2 ?F (36.8 ?C)   Wt 128 lb 6.4 oz (58.2 kg)   SpO2 98%   BMI 23.48 kg/m?  ? ? ?  07/29/2021  ?  3:00 PM 07/23/2021  ?  1:38 PM 07/13/2021  ?  1:47 PM  ?BP/Weight  ?Systolic BP [phone]  ?Diastolic BP 70 74 74  ?Wt. (Lbs) 128.4 130 128.2  ?BMI 23.48 kg/m2 23.78 kg/m2 23.45 kg/m2  ? ? ?Physical Exam ?Constitutional:   ?   Appearance: Normal appearance.  ?HENT:  ?   Head: Normocephalic and atraumatic.  ?Eyes:  ?   General:     ?   Right eye: No discharge.     ?   Left eye: No discharge.  ?  Conjunctiva/sclera: Conjunctivae normal.  ?Cardiovascular:  ?   Rate and Rhythm: Normal rate and regular rhythm.  ?Pulmonary:  ?   Effort: Pulmonary effort is normal.  ?   Breath sounds: Normal breath sounds. No wheezing or rales.  ?Musculoskeletal:  ?   Comments: Tenderness around the right scapula.  ?Neurological:  ?   Mental Status: She is alert.  ?Psychiatric:     ?   Mood and Affect: Mood normal.     ?   Behavior: Behavior normal.  ? ? ?Lab Results  ?Component Value Date  ? WBC 13.0 (H) 05/14/2021  ? HGB 15.0 05/14/2021  ? HCT 45.9 05/14/2021  ? PLT 322 05/14/2021  ? GLUCOSE 85 05/14/2021  ? CHOL 135 07/23/2020  ? TRIG 52 07/23/2020  ? HDL 60 07/23/2020  ? [place] 63 07/23/2020  ? ALT 19 05/14/2021  ? AST 16 05/14/2021  ? NA 140 05/14/2021  ? K 4.2 05/14/2021  ? CL 102 05/14/2021  ? CREATININE 0.98 05/14/2021  ? BUN 19 05/14/2021  ? CO2 29 05/14/2021  ? TSH 3.110 06/19/2019  ? INR  0.94 06/16/2018  ? HGBA1C 5.3 10/05/2019  ? ? ? ?Assessment & Plan:  ? ?Problem List Items Addressed This Visit   ? ?  ? Respiratory  ? Acute bronchitis - Primary  ?  Treating with azithromycin. ?  ?  ?  ? Other  ? Periscapular pain  ?  Advised heat.  Baclofen as directed. ?  ?  ? ? ?Meds ordered this encounter  ?Medications  ? azithromycin (ZITHROMAX) 250 MG tablet  ?  Sig: 2 tablets on day 1, then 1 tablet daily on days 2-5.  ?  Dispense:  6 tablet  ?  Refill:  0  ? baclofen (LIORESAL) 10 MG tablet  ?  Sig: Take 0.5-1 tablets (5-10 mg total) by mouth 2 (two) times daily as needed for muscle spasms.  ?  Dispense:  20 each  ?  Refill:  0  ? ? ?Thersa Salt DO ?[place] ? ?

## 2021-07-30 LAB — LIPID PANEL
Chol/HDL Ratio: 2.9 ratio (ref 0.0–4.4)
Cholesterol, Total: 178 mg/dL (ref 100–199)
HDL: 62 mg/dL (ref >39)
LDL Chol Calc (NIH): 96 mg/dL (ref 0–99)
Triglycerides: 111 mg/dL (ref 0–149)
VLDL Cholesterol Cal: 20 mg/dL (ref 5–40)

## 2021-08-04 ENCOUNTER — Other Ambulatory Visit: Payer: Self-pay

## 2021-08-04 MED ORDER — ROSUVASTATIN CALCIUM 5 MG PO TABS: ORAL_TABLET | ORAL | 3 refills | Status: DC

## 2021-08-11 ENCOUNTER — Ambulatory Visit (INDEPENDENT_AMBULATORY_CARE_PROVIDER_SITE_OTHER): Payer: Medicare Other | Admitting: Family Medicine

## 2021-08-11 ENCOUNTER — Other Ambulatory Visit: Payer: Self-pay | Admitting: Family Medicine

## 2021-08-11 ENCOUNTER — Ambulatory Visit (HOSPITAL_COMMUNITY)
Admission: RE | Admit: 2021-08-11 | Discharge: 2021-08-11 | Disposition: A | Discharge status: Home / Self Care | Payer: Medicare Other | Source: Ambulatory Visit | Attending: Family Medicine | Admitting: Family Medicine

## 2021-08-11 VITALS — BP 159/88 | HR 64 | Temp 97.8°F | Ht 62.0 in | Wt 130.2 lb

## 2021-08-11 DIAGNOSIS — R0781 Pleurodynia: Secondary | ICD-10-CM | POA: Diagnosis not present

## 2021-08-11 DIAGNOSIS — M25511 Pain in right shoulder: Secondary | ICD-10-CM | POA: Diagnosis not present

## 2021-08-11 DIAGNOSIS — M898X1 Other specified disorders of bone, shoulder: Secondary | ICD-10-CM | POA: Diagnosis not present

## 2021-08-11 DIAGNOSIS — M549 Dorsalgia, unspecified: Secondary | ICD-10-CM | POA: Diagnosis not present

## 2021-08-11 DIAGNOSIS — M419 Scoliosis, unspecified: Secondary | ICD-10-CM | POA: Diagnosis not present

## 2021-08-11 DIAGNOSIS — Z9049 Acquired absence of other specified parts of digestive tract: Secondary | ICD-10-CM | POA: Diagnosis not present

## 2021-08-11 MED ORDER — TRAMADOL HCL 50 MG PO TABS: 50.0000 mg | ORAL_TABLET | Freq: Three times a day (TID) | ORAL | 0 refills | Status: DC | PRN

## 2021-08-11 NOTE — Progress Notes (Signed)
? ?Subjective:  ?Patient ID: [name] [name], female    DOB: 05/01/1941  Age: 81 y.o. MRN: [id number] ? ?CC: ?Chief Complaint  ?Patient presents with  ? Pain  ?  Under right shoulder blade. Pain was really bad yesterday that it almost made her nauseated.  ? ?HPI: ? ?81 year old female presents for evaluation of the above. ? ?Patient continues to have pain around the right scapula.  She states that it has been bothersome more so over the past week.  She has recently used Tylenol with improvement.  Worse with certain movements.  No fall, trauma, injury.  No other reported symptoms.  No other complaints. ? ?Patient Active Problem List  ? Diagnosis Date Noted  ? Periscapular pain 07/29/2021  ? Post herpetic neuralgia 06/15/2021  ? Optic neuropathy, left 10/11/2016  ? Peripheral neuropathy 08/26/2016  ? Anal sphincter incompetence 12/31/2015  ? Dyssynergic defecation 12/31/2015  ? HTN (hypertension) 04/18/2015  ? NSVT (nonsustained ventricular tachycardia) ([place]) 11/27/2013  ? Allergic rhinitis 08/20/2013  ? Hyperlipidemia 08/27/2008  ? MVP (mitral valve prolapse) 08/27/2008  ?  IBS- diarrhea predominant 10/13/2007  ? OSTEOPENIA 06/16/2007  ? NHL (non-Hodgkin's lymphoma) ([place]) 06/16/2007  ? ? ?Social Hx   ?Social History  ? ?Socioeconomic History  ? Marital status: Widowed  ?  Spouse name: Not on file  ? Number of children: 1  ? Years of education: Not on file  ? Highest education level: Not on file  ?Occupational History  ? Occupation: Medical sales representative work part time  ?  Employer: RETIRED  ?Tobacco Use  ? Smoking status: Former  ?  Packs/day: 0.33  ?  Types: Cigarettes  ? Smokeless tobacco: Never  ? Tobacco comments:  ?  quit 1976  ?Vaping Use  ? Vaping Use: Never used  ?Substance and Sexual Activity  ? Alcohol use: Yes  ?  Alcohol/week: 0.0 standard drinks  ?  Comment: very rare  ? Drug use: No  ? Sexual activity: Yes  ?  Birth control/protection: Surgical  ?Other Topics Concern  ? Not on file  ?Social History Narrative  ?  Retired, widowed, 1 child  ? Rare EtOH, former smoker none now  ? No drugs  ? Right handed  ? ?Social Determinants of Health  ? ?Financial Resource Strain: Not on file  ?Food Insecurity: Not on file  ?Transportation Needs: Not on file  ?Physical Activity: Not on file  ?Stress: Not on file  ?Social Connections: Not on file  ? ? ?Review of Systems ?Per HPI ? ?Objective:  ?BP (!) 159/88   Pulse 64   Temp 97.8 ?F (36.6 ?C) (Oral)   Ht '5\' 2"'$  (1.575 m)   Wt 130 lb 3.2 oz (59.1 kg)   SpO2 100%   BMI 23.81 kg/m?  ? ? ?  08/11/2021  ?  9:37 AM 07/29/2021  ?  3:00 PM 07/23/2021  ?  1:38 PM  ?BP/Weight  ?Systolic BP 539 767 341  ?Diastolic BP 88 70 74  ?Wt. (Lbs) 130.2 128.4 130  ?BMI 23.81 kg/m2 23.48 kg/m2 23.78 kg/m2  ? ? ?Physical Exam ?Constitutional:   ?   General: She is not in acute distress. ?   Appearance: Normal appearance.  ?HENT:  ?   Head: Normocephalic and atraumatic.  ?Eyes:  ?   General:     ?   Right eye: No discharge.     ?   Left eye: No discharge.  ?   Conjunctiva/sclera: Conjunctivae normal.  ?Cardiovascular:  ?  Rate and Rhythm: Normal rate and regular rhythm.  ?Pulmonary:  ?   Effort: Pulmonary effort is normal.  ?   Breath sounds: Normal breath sounds. No wheezing, rhonchi or rales.  ?Musculoskeletal:  ?   Comments: Tenderness around the medial aspect of the right scapula.  ?Neurological:  ?   Mental Status: She is alert.  ?Psychiatric:     ?   Mood and Affect: Mood normal.     ?   Behavior: Behavior normal.  ? ? ?Lab Results  ?Component Value Date  ? WBC 13.0 (H) 05/14/2021  ? HGB 15.0 05/14/2021  ? HCT 45.9 05/14/2021  ? PLT 322 05/14/2021  ? GLUCOSE 85 05/14/2021  ? CHOL 178 07/29/2021  ? TRIG 111 07/29/2021  ? HDL 62 07/29/2021  ? [place] 96 07/29/2021  ? ALT 19 05/14/2021  ? AST 16 05/14/2021  ? NA 140 05/14/2021  ? K 4.2 05/14/2021  ? CL 102 05/14/2021  ? CREATININE 0.98 05/14/2021  ? BUN 19 05/14/2021  ? CO2 29 05/14/2021  ? TSH 3.110 06/19/2019  ? INR 0.94 06/16/2018  ? HGBA1C 5.3  10/05/2019  ? ? ? ?Assessment & Plan:  ? ?Problem List Items Addressed This Visit   ? ?  ? Other  ? Periscapular pain - Primary  ?  This is MSK in nature.  Patient is very concerned.  X-rays obtained today for further evaluation. ?No remarkable findings. ?Advised heat and use of Tylenol.  Supportive care. ?  ?  ? Relevant Orders  ? DG Thoracic Spine W/Swimmers (Completed)  ? DG Ribs Unilateral W/Chest Right (Completed)  ? ?Thersa Salt DO ?[place] ? ?

## 2021-08-11 NOTE — Assessment & Plan Note (Signed)
This is MSK in nature.  Patient is very concerned.  X-rays obtained today for further evaluation. ?No remarkable findings. ?Advised heat and use of Tylenol.  Supportive care. ?

## 2021-08-11 NOTE — Patient Instructions (Signed)
I believe this is muscular in nature. ? ?I am sending you for xrays to ensure that nothing else is going on. ? ?Tylenol 1000 mg 3 times daily as needed. ? ?Apply heat. ? ?We will call with the results. ? ?Take care ? ?Dr. Lacinda Axon  ? ? ? ?

## 2021-08-18 ENCOUNTER — Inpatient Hospital Stay: Payer: Medicare Other | Attending: Oncology | Admitting: Oncology

## 2021-08-18 VITALS — BP 133/64 | HR 65 | Temp 97.8°F | Resp 18 | Ht 62.0 in | Wt 131.4 lb

## 2021-08-18 DIAGNOSIS — C8203 Follicular lymphoma grade I, intra-abdominal lymph nodes: Secondary | ICD-10-CM | POA: Diagnosis not present

## 2021-08-18 DIAGNOSIS — Z8572 Personal history of non-Hodgkin lymphomas: Secondary | ICD-10-CM | POA: Diagnosis not present

## 2021-08-18 DIAGNOSIS — Z9221 Personal history of antineoplastic chemotherapy: Secondary | ICD-10-CM | POA: Diagnosis not present

## 2021-08-18 DIAGNOSIS — M8580 Other specified disorders of bone density and structure, unspecified site: Secondary | ICD-10-CM | POA: Insufficient documentation

## 2021-08-18 DIAGNOSIS — K589 Irritable bowel syndrome without diarrhea: Secondary | ICD-10-CM | POA: Insufficient documentation

## 2021-08-18 NOTE — Progress Notes (Signed)
?[facility] ?OFFICE PROGRESS NOTE ? ? ?Diagnosis: Non-Hodgkin's lymphoma ? ?INTERVAL HISTORY:  ? ?[name] returns as scheduled.  She has postherpetic neuralgia after developing shingles at the right back in January.  She had increased back pain l over the past few weeks.  Rib and thoracic x-rays revealed no acute change.  The pain has improved.  She is taking gabapentin.  Gabapentin does not cause somnolence.  No fever.  She has occasional mild sweats at night.  No abdominal pain.  She walks daily. ? ?Objective: ? ?Vital signs in last 24 hours: ? ?Blood pressure 133/64, pulse 65, temperature 97.8 ?F (36.6 ?C), temperature source Oral, resp. rate 18, height '5\' 2"'$  (1.575 m), weight 131 lb 6.4 oz (59.6 kg), SpO2 100 %. ?  ? ?Lymphatics: No cervical, supraclavicular, axillary, or inguinal nodes ?Resp: Lungs clear bilaterally ?Cardio: Regular rate and rhythm ?GI: No hepatosplenomegaly, no mass, nontender ?Vascular: No leg edema ? ? ?Lab Results: ? ?Lab Results  ?Component Value Date  ? WBC 13.0 (H) 05/14/2021  ? HGB 15.0 05/14/2021  ? HCT 45.9 05/14/2021  ? MCV 93.1 05/14/2021  ? PLT 322 05/14/2021  ? NEUTROABS 8.8 (H) 05/14/2021  ? ? ?CMP  ?Lab Results  ?Component Value Date  ? NA 140 05/14/2021  ? K 4.2 05/14/2021  ? CL 102 05/14/2021  ? CO2 29 05/14/2021  ? GLUCOSE 85 05/14/2021  ? BUN 19 05/14/2021  ? CREATININE 0.98 05/14/2021  ? CALCIUM 11.1 (H) 05/14/2021  ? PROT 6.6 05/14/2021  ? ALBUMIN 4.4 05/14/2021  ? AST 16 05/14/2021  ? ALT 19 05/14/2021  ? ALKPHOS 56 05/14/2021  ? BILITOT 0.4 05/14/2021  ? GFRNONAA 58 (L) 05/14/2021  ? GFRAA >60 01/17/2020  ? ? ?Medications: I have reviewed the patient's current medications. ? ? ?Assessment/Plan: ? ?Non-Hodgkin's lymphoma-follicular lymphoma ?Mesenteric lymph node biopsy 1/61/0960- follicular center cell lymphoma, grade 2 ?Treated with mitoxantrone, fludarabine, and rituximab completed in January 2002 ?Progressive disease in February 2004, treated with  single agent rituximab February 2004 through August 2005 ?CTs 06/21/2014- no evidence of lymphoma ?CT abdomen/pelvis 06/05/2018- enlarging left mid abdominal mesenteric mass ?CT biopsy of the mesenteric mass 06/16/2018-follicular lymphoma, low-grade, CD20 positive ?Cycle 1 weekly Rituxan 07/24/2018 ?Cycle 2 weekly Rituxan 07/31/2018  ?Cycle 3 weekly Rituxan 08/07/2018 ?Cycle 4 weekly Rituxan 08/14/2018 ?CT abdomen/pelvis 10/09/2018- reduction in size of left lower quadrant mesenteric mass ?Cycle 1 maintenance rituximab 10/19/2018 ?Cycle 2 maintenance Rituxan 12/14/2018 ?Cycle 3 maintenance Rituxan 02/08/2019 ?Cycle 4 maintenance Rituxan 04/09/2019 ?CT abdomen/pelvis 05/31/2019-no change in jejunal mesenteric mass, no evidence of disease progression. ?Cycle 5 maintenance rituximab 06/07/2019 ?Cycle 6 maintenance rituximab 08/02/2019 ?Cycle 7 maintenance rituximab 09/27/2019 ?CT abdomen/pelvis 11/20/2019-further contraction of the lesion at the small bowel mesentery, no evidence of progressive lymphoma ?Cycle 8 maintenance rituximab 11/22/2019 ?Cycle 9 maintenance rituximab 01/17/2020 ?Cycle 10 maintenance rituximab 03/13/2020 ?Cycle 11 maintenance rituximab 05/08/2020 ?Cycle 12 maintenance rituximab 07/03/2020 ?Cycle 13 maintenance rituximab 08/28/2020 ?CTs 10/28/2020-unchanged soft tissue in the left abdominal small bowel mesentery, no evidence of lymphadenopathy or recurrent disease elsewhere in the chest, abdomen, or pelvis ?Irritable bowel syndrome ?History of anal incontinence ?Osteopenia ?History of colon polyps ?Weakness, flushing, presyncope and chills during Rituxan infusion 07/24/2018. ?Mild hypercalcemia-chronic ?COVID-19 04/10/2021 ?Zoster rash, right posterior lateral chest wall 04/30/2021 ?  ? ? ? ? ?Disposition: ?[name] is in clinical remission from non-Hodgkin's lymphoma.  I suspect the right back discomfort is related to postherpetic neuralgia.  She will continue gabapentin and  will follow-up with Dr. Lacinda Axon. ? ?She will return  for an office and lab visit in 6 months. ? ?Betsy Coder, MD ? ?08/18/2021  ?2:57 PM ? ? ?

## 2021-08-20 DIAGNOSIS — M5416 Radiculopathy, lumbar region: Secondary | ICD-10-CM | POA: Diagnosis not present

## 2021-08-25 DIAGNOSIS — G245 Blepharospasm: Secondary | ICD-10-CM | POA: Diagnosis not present

## 2021-08-25 DIAGNOSIS — H16292 Other keratoconjunctivitis, left eye: Secondary | ICD-10-CM | POA: Diagnosis not present

## 2021-08-31 ENCOUNTER — Other Ambulatory Visit (HOSPITAL_COMMUNITY): Payer: Self-pay | Admitting: Family Medicine

## 2021-08-31 DIAGNOSIS — Z1231 Encounter for screening mammogram for malignant neoplasm of breast: Secondary | ICD-10-CM

## 2021-09-07 ENCOUNTER — Ambulatory Visit (HOSPITAL_COMMUNITY)
Admission: RE | Admit: 2021-09-07 | Discharge: 2021-09-07 | Disposition: A | Discharge status: Home / Self Care | Payer: Medicare Other | Source: Ambulatory Visit | Attending: Family Medicine | Admitting: Family Medicine

## 2021-09-07 DIAGNOSIS — Z1231 Encounter for screening mammogram for malignant neoplasm of breast: Secondary | ICD-10-CM | POA: Insufficient documentation

## 2021-09-08 DIAGNOSIS — M5451 Vertebrogenic low back pain: Secondary | ICD-10-CM | POA: Diagnosis not present

## 2021-09-10 DIAGNOSIS — H04123 Dry eye syndrome of bilateral lacrimal glands: Secondary | ICD-10-CM | POA: Diagnosis not present

## 2021-10-16 ENCOUNTER — Telehealth: Payer: Self-pay

## 2021-10-16 NOTE — Telephone Encounter (Signed)
Caller name:Ranetta Purcell Nails   On DPR? :Yes  Call back [phone]  Provider they see: Lacinda Axon   Reason for call:Pt is having Phy next Friday with Hoyle Sauer will blook work need to be ordered and done before then

## 2021-10-16 NOTE — Telephone Encounter (Signed)
Please advise patient requesting lab orders for an upcoming physical appt

## 2021-10-19 ENCOUNTER — Other Ambulatory Visit: Payer: Self-pay

## 2021-10-19 DIAGNOSIS — Z780 Asymptomatic menopausal state: Secondary | ICD-10-CM

## 2021-10-19 DIAGNOSIS — K9089 Other intestinal malabsorption: Secondary | ICD-10-CM

## 2021-10-19 DIAGNOSIS — Z0000 Encounter for general adult medical examination without abnormal findings: Secondary | ICD-10-CM

## 2021-10-19 DIAGNOSIS — Z01419 Encounter for gynecological examination (general) (routine) without abnormal findings: Secondary | ICD-10-CM

## 2021-10-19 DIAGNOSIS — E782 Mixed hyperlipidemia: Secondary | ICD-10-CM

## 2021-10-19 NOTE — Telephone Encounter (Signed)
Pt informed lab orders have been placed.

## 2021-10-20 DIAGNOSIS — K9089 Other intestinal malabsorption: Secondary | ICD-10-CM | POA: Diagnosis not present

## 2021-10-20 DIAGNOSIS — E782 Mixed hyperlipidemia: Secondary | ICD-10-CM | POA: Diagnosis not present

## 2021-10-20 DIAGNOSIS — Z780 Asymptomatic menopausal state: Secondary | ICD-10-CM | POA: Diagnosis not present

## 2021-10-20 DIAGNOSIS — Z01419 Encounter for gynecological examination (general) (routine) without abnormal findings: Secondary | ICD-10-CM | POA: Diagnosis not present

## 2021-10-20 DIAGNOSIS — Z0000 Encounter for general adult medical examination without abnormal findings: Secondary | ICD-10-CM | POA: Diagnosis not present

## 2021-10-21 ENCOUNTER — Encounter: Payer: Self-pay | Admitting: Oncology

## 2021-10-21 ENCOUNTER — Other Ambulatory Visit: Payer: Self-pay

## 2021-10-21 LAB — CBC WITH DIFFERENTIAL/PLATELET
Basophils Absolute: 0 10*3/uL (ref 0.0–0.2)
Basos: 1 %
EOS (ABSOLUTE): 0.1 10*3/uL (ref 0.0–0.4)
Eos: 2 %
Hematocrit: 42.5 % (ref 34.0–46.6)
Hemoglobin: 14.4 g/dL (ref 11.1–15.9)
Immature Grans (Abs): 0 10*3/uL (ref 0.0–0.1)
Immature Granulocytes: 0 %
Lymphocytes Absolute: 1.6 10*3/uL (ref 0.7–3.1)
Lymphs: 23 %
MCH: 30.3 pg (ref 26.6–33.0)
MCHC: 33.9 g/dL (ref 31.5–35.7)
MCV: 89 fL (ref 79–97)
Monocytes Absolute: 1 10*3/uL — ABNORMAL HIGH (ref 0.1–0.9)
Monocytes: 14 %
Neutrophils Absolute: 4.1 10*3/uL (ref 1.4–7.0)
Neutrophils: 60 %
Platelets: 286 10*3/uL (ref 150–450)
RBC: 4.76 x10E6/uL (ref 3.77–5.28)
RDW: 11.9 % (ref 11.7–15.4)
WBC: 6.8 10*3/uL (ref 3.4–10.8)

## 2021-10-21 LAB — LIPID PANEL
Chol/HDL Ratio: 3.2 ratio (ref 0.0–4.4)
Cholesterol, Total: 180 mg/dL (ref 100–199)
HDL: 56 mg/dL (ref >39)
LDL Chol Calc (NIH): 104 mg/dL — ABNORMAL HIGH (ref 0–99)
Triglycerides: 113 mg/dL (ref 0–149)
VLDL Cholesterol Cal: 20 mg/dL (ref 5–40)

## 2021-10-21 LAB — COMPREHENSIVE METABOLIC PANEL
ALT: 16 IU/L (ref 0–32)
AST: 23 IU/L (ref 0–40)
Albumin/Globulin Ratio: 2.7 — ABNORMAL HIGH (ref 1.2–2.2)
Albumin: 4.6 g/dL (ref 3.7–4.7)
Alkaline Phosphatase: 69 IU/L (ref 44–121)
BUN/Creatinine Ratio: 16 (ref 12–28)
BUN: 14 mg/dL (ref 8–27)
Bilirubin Total: 0.3 mg/dL (ref 0.0–1.2)
CO2: 26 mmol/L (ref 20–29)
Calcium: 11.1 mg/dL — ABNORMAL HIGH (ref 8.7–10.3)
Chloride: 104 mmol/L (ref 96–106)
Creatinine, Ser: 0.88 mg/dL (ref 0.57–1.00)
Globulin, Total: 1.7 g/dL (ref 1.5–4.5)
Glucose: 97 mg/dL (ref 70–99)
Potassium: 5 mmol/L (ref 3.5–5.2)
Sodium: 142 mmol/L (ref 134–144)
Total Protein: 6.3 g/dL (ref 6.0–8.5)
eGFR: 66 mL/min/{1.73_m2} (ref >59)

## 2021-10-21 LAB — VITAMIN D 25 HYDROXY (VIT D DEFICIENCY, FRACTURES): Vit D, 25-Hydroxy: 35.6 ng/mL (ref 30.0–100.0)

## 2021-10-23 ENCOUNTER — Encounter: Payer: Medicare Other | Admitting: Family Medicine

## 2021-10-23 ENCOUNTER — Encounter: Payer: Self-pay | Admitting: Nurse Practitioner

## 2021-10-23 ENCOUNTER — Ambulatory Visit (INDEPENDENT_AMBULATORY_CARE_PROVIDER_SITE_OTHER): Payer: Medicare Other | Admitting: Nurse Practitioner

## 2021-10-23 VITALS — BP 138/84 | Ht 61.5 in | Wt 129.2 lb

## 2021-10-23 DIAGNOSIS — Z01419 Encounter for gynecological examination (general) (routine) without abnormal findings: Secondary | ICD-10-CM | POA: Diagnosis not present

## 2021-10-23 DIAGNOSIS — B0229 Other postherpetic nervous system involvement: Secondary | ICD-10-CM | POA: Diagnosis not present

## 2021-10-23 DIAGNOSIS — Z0000 Encounter for general adult medical examination without abnormal findings: Secondary | ICD-10-CM

## 2021-10-23 LAB — PTH, INTACT AND CALCIUM
Calcium: 10.2 mg/dL (ref 8.7–10.3)
PTH: 98 pg/mL — ABNORMAL HIGH (ref 15–65)

## 2021-10-23 LAB — CALCIUM, IONIZED: Calcium, Ion: 6 mg/dL — ABNORMAL HIGH (ref 4.5–5.6)

## 2021-10-23 MED ORDER — ALPRAZOLAM 0.25 MG PO TABS: 0.2500 mg | ORAL_TABLET | Freq: Every day | ORAL | 2 refills | Status: DC | PRN

## 2021-10-24 ENCOUNTER — Encounter: Payer: Self-pay | Admitting: Nurse Practitioner

## 2021-10-26 ENCOUNTER — Other Ambulatory Visit: Payer: Self-pay

## 2021-10-26 ENCOUNTER — Other Ambulatory Visit: Payer: Self-pay | Admitting: *Deleted

## 2021-10-26 DIAGNOSIS — E213 Hyperparathyroidism, unspecified: Secondary | ICD-10-CM

## 2021-10-26 MED ORDER — ATENOLOL 25 MG PO TABS: ORAL_TABLET | ORAL | 2 refills | Status: DC

## 2021-11-23 ENCOUNTER — Other Ambulatory Visit: Payer: Self-pay

## 2021-11-24 ENCOUNTER — Encounter: Payer: Self-pay | Admitting: Nurse Practitioner

## 2021-12-01 ENCOUNTER — Other Ambulatory Visit: Payer: Self-pay

## 2021-12-01 DIAGNOSIS — G245 Blepharospasm: Secondary | ICD-10-CM | POA: Diagnosis not present

## 2021-12-03 ENCOUNTER — Ambulatory Visit: Payer: Medicare Other | Admitting: Nurse Practitioner

## 2021-12-17 ENCOUNTER — Ambulatory Visit (INDEPENDENT_AMBULATORY_CARE_PROVIDER_SITE_OTHER): Payer: Medicare Other | Admitting: "Endocrinology

## 2021-12-17 ENCOUNTER — Encounter: Payer: Self-pay | Admitting: "Endocrinology

## 2021-12-17 VITALS — BP 120/64 | HR 60 | Ht 62.0 in | Wt 131.2 lb

## 2021-12-17 DIAGNOSIS — M810 Age-related osteoporosis without current pathological fracture: Secondary | ICD-10-CM | POA: Diagnosis not present

## 2021-12-17 DIAGNOSIS — E210 Primary hyperparathyroidism: Secondary | ICD-10-CM | POA: Diagnosis not present

## 2021-12-17 NOTE — Progress Notes (Signed)
Endocrinology Consult Note       12/17/2021, 6:56 PM  [name] [name] is a 81 y.o.-year-old female, referred by her  [name], DO  , for evaluation for hypercalcemia/hyperparathyroidism.   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   Manometry 12/2015   Arthritis    Brady-tachy syndrome ([place])    Chest pain 10/16/13   normal coronary arteries on cath and normal EF   Colitis, ischemic ([place])    Colon polyp 07/29/1993   with focal adenomatous changes   Diverticulitis    Diverticulosis of colon    Dyspepsia    Dyssynergic defecation 12/31/2015   Anorectal mano 12/2015, also has rectal hypersensitivity   Fatty liver    GERD (gastroesophageal reflux disease)    Heart murmur    HLD (hyperlipidemia)    Hypertension    IBS (irritable bowel syndrome)    Incontinence, feces    MVP (mitral valve prolapse)    nhl dx'd 07/1999/ 06/2018   chemo comp 2001; rituxin comp 2005   Osteopenia    Palpitation    Rectocele    Rectocele    Vasovagal syncope     Past Surgical History:  Procedure Laterality Date   ABDOMINAL HYSTERECTOMY     ABDOMINAL SURGERY  2001    for non hodgkins lymphoma; lymphoma in [place] N/A 12/24/2015   Procedure: ANO RECTAL MANOMETRY;  Surgeon: Gatha Mayer, MD;  Location: [facility];  Service: Endoscopy;  Laterality: N/A;   BLADDER SUSPENSION     BUNIONECTOMY     CHOLECYSTECTOMY  2003   COLONOSCOPY  7/200/, 11/2007   diverticulosis   FINE NEEDLE ASPIRATION BIOPSY Left 06/16/2018   left mesentery tissue   LEFT HEART CATHETERIZATION WITH CORONARY ANGIOGRAM N/A 10/16/2013   Procedure: LEFT HEART CATHETERIZATION WITH CORONARY ANGIOGRAM;  Surgeon: Peter M Martinique, MD;  Location: [facility] CATH LAB;  Service: Cardiovascular;  Laterality: N/A;   pelvic prolapse repair     UPPER GASTROINTESTINAL ENDOSCOPY  10/2005   hiatal hernia    Social History    Tobacco Use   Smoking status: Former    Packs/day: 0.33    Types: Cigarettes   Smokeless tobacco: Never   Tobacco comments:    quit 1976  Vaping Use   Vaping Use: Never used  Substance Use Topics   Alcohol use: Not Currently    Comment: very rare   Drug use: No    Family History  Problem Relation Age of Onset   Thyroid disease Mother    Osteoporosis Mother    Colon cancer Father 43   Prostate cancer Brother    Colon polyps Brother    Colon polyps Brother    Stomach cancer Maternal Uncle    Stomach cancer Maternal Grandfather    Esophageal cancer Neg Hx    Pancreatic cancer Neg Hx    Liver disease Neg Hx    Breast cancer Neg Hx     Outpatient Encounter Medications as of 12/17/2021  Medication Sig   ALPRAZolam (XANAX) 0.25 MG tablet Take 1 tablet (0.25 mg total) by mouth daily as  needed for anxiety.   atenolol (TENORMIN) 25 MG tablet TAKE 1.5 TABLETS (37.'5MG'$  TOTAL) BY MOUTHDAILY.   fexofenadine (ALLEGRA) 180 MG tablet Take 180 mg by mouth daily as needed for allergies.   pantoprazole (PROTONIX) 40 MG tablet Take 1 tablet (40 mg total) by mouth daily before breakfast.   Polyvinyl Alcohol-Povidone (REFRESH OP) Apply 1 drop to eye 3 (three) times daily.   Probiotic Product (ALIGN PO) Take 1 capsule by mouth daily. Alternates between [place] per PCP orders every few months   rosuvastatin (CRESTOR) 5 MG tablet Mon, Wed and Fri only.   TURMERIC PO Take 1 capsule by mouth daily.    UNABLE TO FIND Apply topically.   Wheat Dextrin (BENEFIBER) POWD Take 1 Dose by mouth daily.   Zinc Sulfate (ZINC 15 PO) Take 15 mg by mouth.   No facility-administered encounter medications on file as of 12/17/2021.    Allergies  Allergen Reactions   Other Anaphylaxis    Other reaction(s): Facial swelling And face swelling And face swelling    Sulfa Antibiotics Swelling and Anaphylaxis    And face swelling Face swelling    Sulfonamide Derivatives Swelling    Face  swelling    Glycopyrrolate Other (See Comments)    Unable to urinate Could not urinate after it was given   Latex Other (See Comments)    Blisters   Methscopolamine Other (See Comments)    Unable to urinate Unable to urinate   Ace Inhibitors Hives   Codeine Nausea Only   Iodine Hives   Iohexol Hives     Desc: 50 mg benadryl prior to exam      HPI  [name] [name] was diagnosed with hypercalcemia more than 5 years ago.  Patient has no previously known history of parathyroid, pituitary, adrenal dysfunctions; no family history of such dysfunctions.  Her most recent labs included PTH which was elevated at 98 associated with calcium of 10.2.  She did have calcium higher than 11 in the past.  She has osteoporosis on ongoing treatment with Reclast every year for 6 years.  No prior history of fragility fractures or falls.  She reports loss of 2 inches of height.  No history of  kidney stones.  Does not have kidney problems.  she is not on HCTZ or other thiazide therapy.  Her vitamin D status is not known.  She is not on vitamin D supplement.  she is not on calcium supplements,  she eats dairy and green, leafy, vegetables on average amounts.  she does not have a family history of hypercalcemia, pituitary tumors, thyroid cancer, or osteoporosis.  Her other medical problems with hyperlipidemia on treatment lymphoma response to treatment with surgery.   ROS:  Constitutional: Minimally fluctuating body weight,  + fatigue, no subjective hyperthermia, no subjective hypothermia Eyes: no blurry vision, no xerophthalmia ENT: no sore throat, no nodules palpated in throat, no dysphagia/odynophagia, no hoarseness Cardiovascular: no Chest Pain, no Shortness of Breath, no palpitations, no leg swelling Respiratory: no cough, no shortness of breath  Gastrointestinal: no Nausea/Vomiting/Diarhhea Musculoskeletal: no muscle/joint aches Skin: no rashes Neurological: no tremors, no numbness, no  tingling, no dizziness Psychiatric: no depression, no anxiety  PE: BP 120/64   Pulse 60   Ht '5\' 2"'$  (1.575 m)   Wt 131 lb 3.2 oz (59.5 kg)   BMI 24.00 kg/m , Body mass index is 24 kg/m. Wt Readings from Last 3 Encounters:  12/17/21 131 lb 3.2 oz (59.5 kg)  10/23/21 129 lb 3.2 oz (58.6 kg)  08/18/21 131 lb 6.4 oz (59.6 kg)    Constitutional: + BMI of 24, not in acute distress, normal state of mind Eyes: PERRLA, EOMI, no exophthalmos ENT: moist mucous membranes, no gross thyromegaly, no gross cervical lymphadenopathy Cardiovascular: normal precordial activity, Regular Rate and Rhythm, no Murmur/Rubs/Gallops Respiratory:  adequate breathing efforts, no gross chest deformity, Clear to auscultation bilaterally Gastrointestinal: abdomen soft, Non -tender, No distension, Bowel Sounds present Musculoskeletal: no gross deformities, strength intact in all four extremities Skin: moist, warm, no rashes Neurological: no tremor with outstretched hands, Deep tendon reflexes normal in bilateral lower extremities.     CMP ( most recent) CMP     Component Value Date/Time   NA 142 10/20/2021 0813   NA 143 06/24/2015 0852   K 5.0 10/20/2021 0813   K 4.3 06/24/2015 0852   CL 104 10/20/2021 0813   CL 105 01/20/2012 1021   CO2 26 10/20/2021 0813   CO2 27 06/24/2015 0852   GLUCOSE 97 10/20/2021 0813   GLUCOSE 85 05/14/2021 0847   GLUCOSE 95 06/24/2015 0852   GLUCOSE 91 01/20/2012 1021   BUN 14 10/20/2021 0813   BUN 11.9 06/24/2015 0852   CREATININE 0.88 10/20/2021 0813   CREATININE 0.98 05/14/2021 0847   CREATININE 0.8 06/24/2015 0852   CALCIUM 10.2 10/22/2021 0809   CALCIUM 10.8 (H) 10/09/2018 0943   CALCIUM 10.1 06/24/2015 0852   PROT 6.3 10/20/2021 0813   PROT 6.8 06/24/2015 0852   ALBUMIN 4.6 10/20/2021 0813   ALBUMIN 3.8 06/24/2015 0852   AST 23 10/20/2021 0813   AST 16 05/14/2021 0847   AST 25 06/24/2015 0852   ALT 16 10/20/2021 0813   ALT 19 05/14/2021 0847   ALT 27  06/24/2015 0852   ALKPHOS 69 10/20/2021 0813   ALKPHOS 78 06/24/2015 0852   BILITOT 0.3 10/20/2021 0813   BILITOT 0.4 05/14/2021 0847   BILITOT 0.34 06/24/2015 0852   GFRNONAA 58 (L) 05/14/2021 0847   GFRAA >60 01/17/2020 0931     Diabetic Labs (most recent): Lab Results  Component Value Date   HGBA1C 5.3 10/05/2019     Lipid Panel ( most recent) Lipid Panel     Component Value Date/Time   CHOL 180 10/20/2021 0813   TRIG 113 10/20/2021 0813   HDL 56 10/20/2021 0813   CHOLHDL 3.2 10/20/2021 0813   CHOLHDL 4.6 02/06/2013 0806   VLDL 33 02/06/2013 0806   LDLCALC 104 (H) 10/20/2021 0813   LABVLDL 20 10/20/2021 0813      Lab Results  Component Value Date   TSH 3.110 06/19/2019   TSH 3.010 11/21/2018   TSH 2.690 03/07/2015   TSH 2.85 08/29/2008      Assessment: 1. Hypercalcemia / Hyperparathyroidism 2.  Osteoporosis   Plan: Patient has had several instances of elevated calcium, with the highest level being at 11.1 mg/dL.  Her most recent labs show calcium better at 10.2, associated with high PTH of 98.   Her history of osteoporosis may or may not be related to her hyperparathyroidism.  History likely that she has mild primary hyperparathyroidism. -  no history of  nephrolithiasis,  fragility fractures. No abdominal pain, no major mood disorders, no bone pain.  - I discussed with the patient about the physiology of calcium and parathyroid hormone, and possible  effects of  increased PTH/ Calcium , including kidney stones, cardiac dysrhythmias, osteoporosis, abdominal pain, etc.   - The work up so  far is not sufficient to reach a conclusion for definitive therapy.    I will proceed to obtain  24-hour urine calcium/creatinine to rule out the rare but important cause of mild elevation in calcium and PTH- [place] ( Familial Hypocalciuric Hypercalcemia), which may not require any active intervention.  If she is confirmed to have primary hyperparathyroidism, she is not a  surgical candidate.  She will be considered for low-dose Sensipar.  Regarding her osteoporosis, she is under the care of Dr. Leigh Aurora. Her most recent bone density from August 2022 showed -2.8 T score on dual femur which was unchanged from her 2020 study.  A T score of -2.4 on left forearm radius .  Lumbar spine was excluded due to advanced degenerative changes.  Her next bone density is due in August 2024. She is advised to continue Reclast until her next bone density.  Afterwards, she will be either on drug holidays or switch to Prolia.  This decision will depend on her next bone density findings.  Her next appointment must include distal third of radius.    - Time spent with the patient: 50 minutes, of which >50% was spent in obtaining information about her symptoms, reviewing her previous labs, evaluations, and treatments, counseling her about her hypercalcemia/hyperparathyroidism, and developing a plan to confirm the diagnosis and long term treatment as necessary.  Please refer to " Patient Self Inventory" in the Media  tab for reviewed elements of pertinent patient history.  [name] participated in the discussions, expressed understanding, and voiced agreement with the above plans.  All questions were answered to her satisfaction. she is encouraged to contact clinic should she have any questions or concerns prior to her return visit.  - Return in about 2 weeks (around 12/31/2021) for [place], MD [facility] [address] [place], [place] [address] Phone: [phone]  Fax: [phone]    This note was partially dictated with voice recognition software. Similar sounding words can be transcribed inadequately or may not  be corrected upon review.  12/17/2021, 6:56 PM

## 2021-12-22 DIAGNOSIS — E210 Primary hyperparathyroidism: Secondary | ICD-10-CM | POA: Diagnosis not present

## 2021-12-23 ENCOUNTER — Ambulatory Visit: Payer: Medicare Other | Admitting: Nurse Practitioner

## 2021-12-23 LAB — CREATININE, URINE, 24 HOUR
Creatinine, 24H Ur: 554 mg/24 hr — ABNORMAL LOW (ref 800–1800)
Creatinine, Urine: 27.7 mg/dL

## 2021-12-24 LAB — CALCIUM, URINE, 24 HOUR
Calcium, 24H Urine: 174 mg/24 hr (ref 0–320)
Calcium, Urine: 8.7 mg/dL

## 2021-12-29 DIAGNOSIS — Z6823 Body mass index (BMI) 23.0-23.9, adult: Secondary | ICD-10-CM | POA: Diagnosis not present

## 2021-12-29 DIAGNOSIS — E559 Vitamin D deficiency, unspecified: Secondary | ICD-10-CM | POA: Diagnosis not present

## 2021-12-29 DIAGNOSIS — M79674 Pain in right toe(s): Secondary | ICD-10-CM | POA: Diagnosis not present

## 2021-12-29 DIAGNOSIS — M79642 Pain in left hand: Secondary | ICD-10-CM | POA: Diagnosis not present

## 2021-12-29 DIAGNOSIS — M810 Age-related osteoporosis without current pathological fracture: Secondary | ICD-10-CM | POA: Diagnosis not present

## 2021-12-29 DIAGNOSIS — M1991 Primary osteoarthritis, unspecified site: Secondary | ICD-10-CM | POA: Diagnosis not present

## 2021-12-29 DIAGNOSIS — M5136 Other intervertebral disc degeneration, lumbar region: Secondary | ICD-10-CM | POA: Diagnosis not present

## 2021-12-31 ENCOUNTER — Ambulatory Visit (INDEPENDENT_AMBULATORY_CARE_PROVIDER_SITE_OTHER): Payer: Medicare Other | Admitting: "Endocrinology

## 2021-12-31 ENCOUNTER — Encounter: Payer: Self-pay | Admitting: "Endocrinology

## 2021-12-31 VITALS — BP 116/62 | HR 72 | Ht 62.0 in | Wt 131.8 lb

## 2021-12-31 DIAGNOSIS — M810 Age-related osteoporosis without current pathological fracture: Secondary | ICD-10-CM

## 2021-12-31 DIAGNOSIS — E210 Primary hyperparathyroidism: Secondary | ICD-10-CM

## 2021-12-31 NOTE — Progress Notes (Signed)
12/31/2021, 1:38 PM  Endocrinology follow-up note  [name] [name] is a 81 y.o.-year-old female, referred by her  [name], DO  , for evaluation for hypercalcemia/hyperparathyroidism.   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   Manometry 12/2015   Arthritis    Brady-tachy syndrome ([place])    Chest pain 10/16/13   normal coronary arteries on cath and normal EF   Colitis, ischemic ([place])    Colon polyp 07/29/1993   with focal adenomatous changes   Diverticulitis    Diverticulosis of colon    Dyspepsia    Dyssynergic defecation 12/31/2015   Anorectal mano 12/2015, also has rectal hypersensitivity   Fatty liver    GERD (gastroesophageal reflux disease)    Heart murmur    HLD (hyperlipidemia)    Hypertension    IBS (irritable bowel syndrome)    Incontinence, feces    MVP (mitral valve prolapse)    nhl dx'd 07/1999/ 06/2018   chemo comp 2001; rituxin comp 2005   Osteopenia    Palpitation    Rectocele    Rectocele    Vasovagal syncope     Past Surgical History:  Procedure Laterality Date   ABDOMINAL HYSTERECTOMY     ABDOMINAL SURGERY  2001    for non hodgkins lymphoma; lymphoma in [place] N/A 12/24/2015   Procedure: ANO RECTAL MANOMETRY;  Surgeon: Gatha Mayer, MD;  Location: [facility];  Service: Endoscopy;  Laterality: N/A;   BLADDER SUSPENSION     BUNIONECTOMY     CHOLECYSTECTOMY  2003   COLONOSCOPY  7/200/, 11/2007   diverticulosis   FINE NEEDLE ASPIRATION BIOPSY Left 06/16/2018   left mesentery tissue   LEFT HEART CATHETERIZATION WITH CORONARY ANGIOGRAM N/A 10/16/2013   Procedure: LEFT HEART CATHETERIZATION WITH CORONARY ANGIOGRAM;  Surgeon: Peter M Martinique, MD;  Location: [facility] CATH LAB;  Service: Cardiovascular;  Laterality: N/A;   pelvic prolapse repair     UPPER GASTROINTESTINAL ENDOSCOPY  10/2005   hiatal hernia    Social History    Tobacco Use   Smoking status: Former    Packs/day: 0.33    Types: Cigarettes   Smokeless tobacco: Never   Tobacco comments:    quit 1976  Vaping Use   Vaping Use: Never used  Substance Use Topics   Alcohol use: Not Currently    Comment: very rare   Drug use: No    Family History  Problem Relation Age of Onset   Thyroid disease Mother    Osteoporosis Mother    Colon cancer Father 27   Prostate cancer Brother    Colon polyps Brother    Colon polyps Brother    Stomach cancer Maternal Uncle    Stomach cancer Maternal Grandfather    Esophageal cancer Neg Hx    Pancreatic cancer Neg Hx    Liver disease Neg Hx    Breast cancer Neg Hx     Outpatient Encounter Medications as of 12/31/2021  Medication Sig   ALPRAZolam (XANAX) 0.25 MG tablet Take 1 tablet (0.25 mg total) by mouth daily as needed for anxiety.  atenolol (TENORMIN) 25 MG tablet TAKE 1.5 TABLETS (37.5MG TOTAL) BY MOUTHDAILY.   fexofenadine (ALLEGRA) 180 MG tablet Take 180 mg by mouth daily as needed for allergies.   pantoprazole (PROTONIX) 40 MG tablet Take 1 tablet (40 mg total) by mouth daily before breakfast.   Polyvinyl Alcohol-Povidone (REFRESH OP) Apply 1 drop to eye 3 (three) times daily.   Probiotic Product (ALIGN PO) Take 1 capsule by mouth daily. Alternates between [place] per PCP orders every few months   rosuvastatin (CRESTOR) 5 MG tablet Mon, Wed and Fri only.   TURMERIC PO Take 1 capsule by mouth daily.    UNABLE TO FIND Apply topically.   Wheat Dextrin (BENEFIBER) POWD Take 1 Dose by mouth daily.   Zinc Sulfate (ZINC 15 PO) Take 15 mg by mouth.   No facility-administered encounter medications on file as of 12/31/2021.    Allergies  Allergen Reactions   Other Anaphylaxis    Other reaction(s): Facial swelling And face swelling And face swelling    Sulfa Antibiotics Swelling and Anaphylaxis    And face swelling Face swelling    Sulfonamide Derivatives Swelling    Face  swelling    Glycopyrrolate Other (See Comments)    Unable to urinate Could not urinate after it was given   Latex Other (See Comments)    Blisters   Methscopolamine Other (See Comments)    Unable to urinate Unable to urinate   Ace Inhibitors Hives   Codeine Nausea Only   Iodine Hives   Iohexol Hives     Desc: 50 mg benadryl prior to exam      HPI  [name] [name] was diagnosed with hypercalcemia more than 5 years ago.  Patient has no previously known history of parathyroid, pituitary, adrenal dysfunctions; no family history of such dysfunctions.  Her most recent labs included PTH which was elevated at 98 associated with calcium of 10.2.  She did have calcium higher than 11 in the past. Her 24-hour urine calcium is 174. She has osteoporosis on ongoing treatment with Reclast every year for 6 years.  No prior history of fragility fractures or falls.  She reports loss of 2 inches of height.  No history of  kidney stones.  Does not have kidney problems.  she is not on HCTZ or other thiazide therapy.  Her vitamin D status is not known.  She is not on vitamin D supplement.  she is not on calcium supplements,  she eats dairy and green, leafy, vegetables on average amounts.  she does not have a family history of hypercalcemia, pituitary tumors, thyroid cancer, or osteoporosis.  Her other medical problems with hyperlipidemia on treatment lymphoma response to treatment with surgery.   ROS:  Constitutional: Minimally fluctuating body weight,  + fatigue, no subjective hyperthermia, no subjective hypothermia Eyes: no blurry vision, no xerophthalmia ENT: no sore throat, no nodules palpated in throat, no dysphagia/odynophagia, no hoarseness Cardiovascular: no Chest Pain, no Shortness of Breath, no palpitations, no leg swelling Respiratory: no cough, no shortness of breath  Gastrointestinal: no Nausea/Vomiting/Diarhhea Musculoskeletal: no muscle/joint aches Skin: no  rashes Neurological: no tremors, no numbness, no tingling, no dizziness Psychiatric: no depression, no anxiety  PE: BP 116/62   Pulse 72   Ht _0  (1.575 m)   Wt 131 lb 12.8 oz (59.8 kg)   BMI 24.11 kg/m , Body mass index is 24.11 kg/m. Wt Readings from Last 3 Encounters:  12/31/21 131 lb 12.8 oz (59.8 kg)  12/17/21 131 lb 3.2 oz (59.5 kg)  10/23/21 129 lb 3.2 oz (58.6 kg)       CMP ( most recent) CMP    Diabetic Labs (most recent): Lab Results  Component Value Date   HGBA1C 5.3 10/05/2019       Lab Results  Component Value Date   TSH 3.110 06/19/2019   TSH 3.010 11/21/2018   TSH 2.690 03/07/2015   TSH 2.85 08/29/2008    Recent Results (from the past 2160 hour(s))  CBC with Differential/Platelet     Status: Abnormal   Collection Time: 10/20/21  8:13 AM  Result Value Ref Range   WBC 6.8 3.4 - 10.8 x10E3/uL   RBC 4.76 3.77 - 5.28 x10E6/uL   Hemoglobin 14.4 11.1 - 15.9 g/dL   Hematocrit 42.5 34.0 - 46.6 %   MCV 89 79 - 97 fL   MCH 30.3 26.6 - 33.0 pg   MCHC 33.9 31.5 - 35.7 g/dL   RDW 11.9 11.7 - 15.4 %   Platelets 286 150 - 450 x10E3/uL   Neutrophils 60 Not Estab. %   Lymphs 23 Not Estab. %   Monocytes 14 Not Estab. %   Eos 2 Not Estab. %   Basos 1 Not Estab. %   Neutrophils Absolute 4.1 1.4 - 7.0 x10E3/uL   Lymphocytes Absolute 1.6 0.7 - 3.1 x10E3/uL   Monocytes Absolute 1.0 (H) 0.1 - 0.9 x10E3/uL   EOS (ABSOLUTE) 0.1 0.0 - 0.4 x10E3/uL   Basophils Absolute 0.0 0.0 - 0.2 x10E3/uL   Immature Granulocytes 0 Not Estab. %   Immature Grans (Abs) 0.0 0.0 - 0.1 x10E3/uL  Comprehensive metabolic panel     Status: Abnormal   Collection Time: 10/20/21  8:13 AM  Result Value Ref Range   Glucose 97 70 - 99 mg/dL   BUN 14 8 - 27 mg/dL   Creatinine, Ser 0.88 0.57 - 1.00 mg/dL   eGFR 66 >59 mL/min/1.73   BUN/Creatinine Ratio 16 12 - 28   Sodium 142 134 - 144 mmol/L   Potassium 5.0 3.5 - 5.2 mmol/L   Chloride 104 96 - 106 mmol/L   CO2 26 20 - 29 mmol/L    Calcium 11.1 (H) 8.7 - 10.3 mg/dL   Total Protein 6.3 6.0 - 8.5 g/dL   Albumin 4.6 3.7 - 4.7 g/dL   Globulin, Total 1.7 1.5 - 4.5 g/dL   Albumin/Globulin Ratio 2.7 (H) 1.2 - 2.2   Bilirubin Total 0.3 0.0 - 1.2 mg/dL   Alkaline Phosphatase 69 44 - 121 IU/L   AST 23 0 - 40 IU/L   ALT 16 0 - 32 IU/L  Lipid panel     Status: Abnormal   Collection Time: 10/20/21  8:13 AM  Result Value Ref Range   Cholesterol, Total 180 100 - 199 mg/dL   Triglycerides 113 0 - 149 mg/dL   HDL 56 >39 mg/dL   VLDL Cholesterol Cal 20 5 - 40 mg/dL   LDL Chol Calc (NIH) 104 (H) 0 - 99 mg/dL   Chol/HDL Ratio 3.2 0.0 - 4.4 ratio    Comment:                                   T. Chol/HDL Ratio  Men  Women                               1/2 Avg.Risk  3.4    3.3                                   Avg.Risk  5.0    4.4                                2X Avg.Risk  9.6    7.1                                3X Avg.Risk 23.4   11.0   VITAMIN D 25 Hydroxy (Vit-D Deficiency, Fractures)     Status: None   Collection Time: 10/20/21  8:13 AM  Result Value Ref Range   Vit D, 25-Hydroxy 35.6 30.0 - 100.0 ng/mL    Comment: Vitamin D deficiency has been defined by the [place] practice guideline as a level of serum 25-OH vitamin D less than 20 ng/mL (1,2). The Endocrine Society went on to further define vitamin D insufficiency as a level between 21 and 29 ng/mL (2). 1. IOM (Institute of Medicine). 2010. Dietary reference    intakes for calcium and D. [place]: The    [employer]. 2. Holick MF, Binkley [place], Bischoff-Ferrari HA, et al.    Evaluation, treatment, and prevention of vitamin D    deficiency: an Endocrine Society clinical practice    guideline. JCEM. 2011 Jul; 96(7):1911-30.   Calcium, ionized     Status: Abnormal   Collection Time: 10/22/21  8:09 AM  Result Value Ref Range   Calcium, Ion 6.0 (H) 4.5 - 5.6 mg/dL  PTH,  Intact and Calcium     Status: Abnormal   Collection Time: 10/22/21  8:09 AM  Result Value Ref Range   Calcium 10.2 8.7 - 10.3 mg/dL   PTH 98 (H) 15 - 65 pg/mL   PTH Interp Comment     Comment: Interpretation                 Intact PTH    Calcium                                 (pg/mL)      (mg/dL) Normal                          15 - 65     8.6 - 10.2 Primary Hyperparathyroidism         >65          >10.2 Secondary Hyperparathyroidism       >65          <10.2 Non-Parathyroid Hypercalcemia       <65          >10.2 Hypoparathyroidism                  <15          < 8.6 Non-Parathyroid Hypocalcemia    15 - 65          <  8.6   Creatinine, urine, 24 hour     Status: Abnormal   Collection Time: 12/22/21 10:00 AM  Result Value Ref Range   Creatinine, Urine 27.7 Not Estab. mg/dL   Creatinine, 24H Ur 554 (L) 800 - 1,800 mg/24 hr  Calcium, urine, 24 hour     Status: None   Collection Time: 12/22/21 12:00 PM  Result Value Ref Range   Calcium, Urine 8.7 Not Estab. mg/dL   Calcium, 24H Urine 174 0 - 320 mg/24 hr     Assessment: 1. Hypercalcemia / Hyperparathyroidism 2.  Osteoporosis   Plan: Patient has had several instances of elevated calcium, with the highest level being at 11.1 mg/dL.  Her most recent labs show calcium better at 10.2, associated with high PTH of 98.   Her history of osteoporosis may or may not be related to her hyperparathyroidism.  History likely that she has mild primary hyperparathyroidism. -  no history of  nephrolithiasis,  fragility fractures. No abdominal pain, no major mood disorders, no bone pain.  - I discussed with the patient about the physiology of calcium and parathyroid hormone, and possible  effects of  increased PTH/ Calcium , including kidney stones, cardiac dysrhythmias, osteoporosis, abdominal pain, etc.   -24-hour urine calcium is not significant enough for nephrolithiasis concern.  It is unlikely that she has [place].  This is still likely a component  of mild, normocalcemic primary hyperparathyroidism.  She will be put on observation only until next measurement in 4 months where she will have PTH/calcium, magnesium, phosphorus measured.    Even if she is confirmed to have primary hyperparathyroidism, she is not a surgical candidate.  She will be considered for low-dose Sensipar if necessary on subsequent visits..  Regarding her osteoporosis, she is under the care of Dr. Leigh Aurora. Her most recent bone density from August 2022 showed -2.8 T score on dual femur which was unchanged from her 2020 study.  A T score of -2.4 on left forearm radius .  Lumbar spine was excluded due to advanced degenerative changes.  Her next bone density is due in August 2024. She is advised to continue Reclast until her next bone density.  Afterwards, she will be either on drug holidays or switch to Prolia.  This decision will depend on her next bone density findings.  Her next appointment must include distal third of radius.    I spent 23 minutes in the care of the patient today including review of labs from Thyroid Function, CMP, and other relevant labs ; imaging/biopsy records (current and previous including abstractions from other facilities); face-to-face time discussing  her lab results and symptoms, medications doses, her options of short and long term treatment based on the latest standards of care / guidelines;   and documenting the encounter.  [name]  participated in the discussions, expressed understanding, and voiced agreement with the above plans.  All questions were answered to her satisfaction. she is encouraged to contact clinic should she have any questions or concerns prior to her return visit.   - Return in about 4 months (around 05/02/2022) for F/U with Pre-visit Labs.   Glade Lloyd, MD [facility] [address] [place], [place] [address] Phone: [phone]  Fax: [phone]     This note was partially dictated with voice recognition software. Similar sounding words can be transcribed inadequately or may not  be corrected upon review.  12/31/2021, 1:38 PM

## 2022-01-13 NOTE — Progress Notes (Deleted)
Cardiology Office Note:    Date:  01/13/2022   ID:  [name] [name], DOB [birth year], MRN [id number]  PCP:  [name], DO   [place] Providers Cardiologist:  Ena Dawley, MD {   Referring MD: [name], DO    History of Present Illness:    [name] [name] is a 81 y.o. female with a hx of MVP, NSVT, HLD, HTN, and non Hodgkin's lymphoma on chronic Rituximab who was previously followed by Dr. Meda Coffee who now returns to clinic for follow-up.  Cath 2015 with normal coronary arteries. Holter with NSVT on atenolol. Cardiac MRI normal.   Was last seen in 07/2021 where she was doing well from a CV standpoint. Had suffered with shingles.  Today. ***  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   Manometry 12/2015   Arthritis    Brady-tachy syndrome ([place])    Chest pain 10/16/13   normal coronary arteries on cath and normal EF   Colitis, ischemic ([place])    Colon polyp 07/29/1993   with focal adenomatous changes   Diverticulitis    Diverticulosis of colon    Dyspepsia    Dyssynergic defecation 12/31/2015   Anorectal mano 12/2015, also has rectal hypersensitivity   Fatty liver    GERD (gastroesophageal reflux disease)    Heart murmur    HLD (hyperlipidemia)    Hypertension    IBS (irritable bowel syndrome)    Incontinence, feces    MVP (mitral valve prolapse)    nhl dx'd 07/1999/ 06/2018   chemo comp 2001; rituxin comp 2005   Osteopenia    Palpitation    Rectocele    Rectocele    Vasovagal syncope     Past Surgical History:  Procedure Laterality Date   ABDOMINAL HYSTERECTOMY     ABDOMINAL SURGERY  2001    for non hodgkins lymphoma; lymphoma in [place] N/A 12/24/2015   Procedure: ANO RECTAL MANOMETRY;  Surgeon: Gatha Mayer, MD;  Location: [facility];  Service: Endoscopy;  Laterality: N/A;   BLADDER SUSPENSION     BUNIONECTOMY     CHOLECYSTECTOMY  2003   COLONOSCOPY  7/200/, 11/2007    diverticulosis   FINE NEEDLE ASPIRATION BIOPSY Left 06/16/2018   left mesentery tissue   LEFT HEART CATHETERIZATION WITH CORONARY ANGIOGRAM N/A 10/16/2013   Procedure: LEFT HEART CATHETERIZATION WITH CORONARY ANGIOGRAM;  Surgeon: Peter M Martinique, MD;  Location: [facility] CATH LAB;  Service: Cardiovascular;  Laterality: N/A;   pelvic prolapse repair     UPPER GASTROINTESTINAL ENDOSCOPY  10/2005   hiatal hernia    Current Medications: No outpatient medications have been marked as taking for the 01/15/22 encounter (Appointment) with [name] Bergeron, MD.     Allergies:   Other, Sulfa antibiotics, Sulfonamide derivatives, Glycopyrrolate, Latex, Methscopolamine, Ace inhibitors, Codeine, Iodine, and Iohexol   Social History   Socioeconomic History   Marital status: Widowed    Spouse name: Not on file   Number of children: 1   Years of education: Not on file   Highest education level: Not on file  Occupational History   Occupation: Medical sales representative work part time    Employer: RETIRED  Tobacco Use   Smoking status: Former    Packs/day: 0.33    Types: Cigarettes   Smokeless tobacco: Never   Tobacco comments:    quit 1976  Vaping Use   Vaping Use: Never used  Substance and Sexual Activity  Alcohol use: Not Currently    Comment: very rare   Drug use: No   Sexual activity: Yes    Birth control/protection: Surgical  Other Topics Concern   Not on file  Social History Narrative   Retired, widowed, 1 child   Rare EtOH, former smoker none now   No drugs   Right handed   Social Determinants of Health   Financial Resource Strain: Not on file  Food Insecurity: Not on file  Transportation Needs: Not on file  Physical Activity: Not on file  Stress: Not on file  Social Connections: Not on file     Family History: The patient's family history includes Colon cancer (age of onset: 88) in her father; Colon polyps in her brother and brother; Osteoporosis in her mother; Prostate cancer in her  brother; Stomach cancer in her maternal grandfather and maternal uncle; Thyroid disease in her mother. There is no history of Esophageal cancer, Pancreatic cancer, Liver disease, or Breast cancer.  ROS:   Review of Systems  Constitutional:  Negative for diaphoresis, fever and malaise/fatigue.  HENT:  Negative for congestion, ear pain and hearing loss.   Eyes:  Negative for blurred vision and pain.  Respiratory:  Negative for cough, shortness of breath and wheezing.   Cardiovascular:  Negative for chest pain, palpitations, orthopnea, claudication, leg swelling and PND.  Gastrointestinal:  Negative for abdominal pain, blood in stool, constipation and diarrhea.  Genitourinary:  Negative for dysuria and hematuria.  Musculoskeletal:  Positive for myalgias. Negative for falls.  Neurological:  Positive for tingling (bilateral fingers) and sensory change (bilateral fingers). Negative for dizziness, tremors and headaches.  Endo/Heme/Allergies:  Does not bruise/bleed easily.  Psychiatric/Behavioral:  Negative for depression. The patient does not have insomnia.      EKGs/Labs/Other Studies Reviewed:    The following studies were reviewed today: TTTE 10/17/18 IMPRESSIONS   1. The left ventricle has normal systolic function, with an ejection fraction of 55-60%. The cavity size was normal. Left ventricular diastolic Doppler parameters are consistent with impaired relaxation.   2. The right ventricle has normal systolic function. The cavity was normal. There is no increase in right ventricular wall thickness.   3. Borderline bileaflet prolapse of the mitral valve leaflets with trivial central mitral regurgitation.   4. The aortic valve is tricuspid. Mild sclerosis of the aortic valve. No stenosis of the aortic valve.   5. The aortic root and ascending aorta are normal in size and structure.  30-day monitor 03/06/2018 sinus bradycardia to sinus tachycardia with 3 episodes of NSVT: 2 5 beats and one 6  beat   Cardiac MRI 03/2018 IMPRESSION: 1. Normal left ventricular size, thickness and systolic function (LVEF =03%). There is paradoxical septal motion. There is no late gadolinium enhancement in the left ventricular myocardium.   2. Normal right ventricular size, thickness and systolic function (LVEF = 63%). There are no regional wall motion abnormalities.   3.  Normal left and right atrial size.   4. Normal size of the aortic root, ascending aorta and pulmonary artery.   5. Anterior mitral valve leaflet prolapse with mitral regurgitation. Mild tricuspid regurgitation.   6.  Normal pericardium.  No pericardial effusion.   Normal cardiac MRI without evidence for inflammatory cardiomyopathy or ARVC.    Electronically Signed   By: Ena Dawley   On: 03/28/2018 19:07     CARDIAC CATH Final Conclusions 10/2013:   1. Normal coronary anatomy 2. Normal LV function   Recommendations: medical  therapy.  EKG:   07/23/2021: NSR, rate 74 bpm 11/13/2020: no ekg ordered today.  Recent Labs: 10/20/2021: ALT 16; BUN 14; Creatinine, Ser 0.88; Hemoglobin 14.4; Platelets 286; Potassium 5.0; Sodium 142  Recent Lipid Panel    Component Value Date/Time   CHOL 180 10/20/2021 0813   TRIG 113 10/20/2021 0813   HDL 56 10/20/2021 0813   CHOLHDL 3.2 10/20/2021 0813   CHOLHDL 4.6 02/06/2013 0806   VLDL 33 02/06/2013 0806   LDLCALC 104 (H) 10/20/2021 0813     Risk Assessment/Calculations:           Physical Exam:    VS:  There were no vitals taken for this visit.    Wt Readings from Last 3 Encounters:  12/31/21 131 lb 12.8 oz (59.8 kg)  12/17/21 131 lb 3.2 oz (59.5 kg)  10/23/21 129 lb 3.2 oz (58.6 kg)     GEN:  Well nourished, well developed in no acute distress HEENT: Normal NECK: No JVD; No carotid bruits LYMPHATICS: No lymphadenopathy CARDIAC: RRR, 1/6 systolic murmurs, no rubs or gallops RESPIRATORY:  Clear to auscultation without rales, wheezing or rhonchi   ABDOMEN: Soft, non-tender, non-distended MUSCULOSKELETAL:  No edema; No deformity  SKIN: Warm and dry NEUROLOGIC:  Alert and oriented x 3 PSYCHIATRIC:  Normal affect   ASSESSMENT:    No diagnosis found.   PLAN:    In order of problems listed above:  #Palpitations: #History of NSVT: Normal cath in 2015 and cardiac MRI 2019 normal. Overall well controlled on the atenolol. -Continue atenolol 37.'5mg'$  daily  #MVP: Stable on TTE 12/2020 with mild MR.  -Continue serial monitoring  #HTN: Well controlled. No orthostatic symptoms -Continue atenolol 37.'5mg'$  daily  #HLD: -Crestor '5mg'$  M, W, F -LDL 104  #Non Hodgkins Lymphoma: Recurrent. On chronic rituximab. -Management per Onc      Follow up in 6 months.  Medication Adjustments/Labs and Tests Ordered: Current medicines are reviewed at length with the patient today.  Concerns regarding medicines are outlined above.  No orders of the defined types were placed in this encounter.  No orders of the defined types were placed in this encounter.   There are no Patient Instructions on file for this visit.   I,Mykaella Javier,acting as a scribe for [name] Bergeron, MD.,have documented all relevant documentation on the behalf of [name] Bergeron, MD,as directed by  [name] Bergeron, MD while in the presence of [name] Bergeron, MD.  I, [name] Bergeron, MD, have reviewed all documentation for this visit. The documentation on 01/13/22 for the exam, diagnosis, procedures, and orders are all accurate and complete.    Signed, [name] Bergeron, MD  01/13/2022 3:35 PM    [facility]

## 2022-01-14 NOTE — Progress Notes (Signed)
Cardiology Office Note:    Date:  01/15/2022   ID:  [name] [name], DOB 1940/09/22, MRN [id number]  PCP:  [name], DO   [facility] Providers Cardiologist:  Ena Dawley, MD {   Referring MD: [name], DO    History of Present Illness:    [name] [name] is a 81 y.o. female with a hx of MVP, NSVT, HLD, HTN, and non Hodgkin's lymphoma on chronic Rituximab who was previously followed by Dr. Meda Coffee who now returns to clinic for follow-up.  Cath 2015 with normal coronary arteries. Holter with NSVT on atenolol. Cardiac MRI normal.   Was last seen in 07/2021 where she was doing well from a CV standpoint. Had suffered with shingles.  Today, the patient states that she has finally recovered from shingles, but is still suffering from neuralgia. Currently she is on gabapentin.  Every now and then she will still have episodes which she describes as "her heart feels like it is doing something" which she thinks is palpitations. She then feels lightheaded for 10 seconds. This most recently occurred about 3 weeks ago after standing up. These episodes never wake her up from sleep. No chest pain, orthopnea, or edema.  Recently she received an epidural injection in her back, with subsequent improvement in her sciatic pain. She has been able to walk for exercise, and she also mowed her lawn. Every day she will walk 2 miles while pushing a walker. If she doesn't use a walker, then she will develop left hip pain.  She remains compliant with Crestor on Mondays, Wednesdays, and Fridays.  She denies any shortness of breath, headaches, syncope, or PND.   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   Manometry 12/2015   Arthritis    Brady-tachy syndrome ([place])    Chest pain 10/16/13   normal coronary arteries on cath and normal EF   Colitis, ischemic ([place])    Colon polyp 07/29/1993   with focal adenomatous changes   Diverticulitis     Diverticulosis of colon    Dyspepsia    Dyssynergic defecation 12/31/2015   Anorectal mano 12/2015, also has rectal hypersensitivity   Fatty liver    GERD (gastroesophageal reflux disease)    Heart murmur    HLD (hyperlipidemia)    Hypertension    IBS (irritable bowel syndrome)    Incontinence, feces    MVP (mitral valve prolapse)    nhl dx'd 07/1999/ 06/2018   chemo comp 2001; rituxin comp 2005   Osteopenia    Palpitation    Rectocele    Rectocele    Vasovagal syncope     Past Surgical History:  Procedure Laterality Date   ABDOMINAL HYSTERECTOMY     ABDOMINAL SURGERY  2001    for non hodgkins lymphoma; lymphoma in [place] N/A 12/24/2015   Procedure: ANO RECTAL MANOMETRY;  Surgeon: Gatha Mayer, MD;  Location: [facility];  Service: Endoscopy;  Laterality: N/A;   BLADDER SUSPENSION     BUNIONECTOMY     CHOLECYSTECTOMY  2003   COLONOSCOPY  7/200/, 11/2007   diverticulosis   FINE NEEDLE ASPIRATION BIOPSY Left 06/16/2018   left mesentery tissue   LEFT HEART CATHETERIZATION WITH CORONARY ANGIOGRAM N/A 10/16/2013   Procedure: LEFT HEART CATHETERIZATION WITH CORONARY ANGIOGRAM;  Surgeon: Peter M Martinique, MD;  Location: [facility] CATH LAB;  Service: Cardiovascular;  Laterality: N/A;   pelvic prolapse repair  UPPER GASTROINTESTINAL ENDOSCOPY  10/2005   hiatal hernia    Current Medications: Current Meds  Medication Sig   ALPRAZolam (XANAX) 0.25 MG tablet Take 1 tablet (0.25 mg total) by mouth daily as needed for anxiety.   atenolol (TENORMIN) 25 MG tablet TAKE 1.5 TABLETS (37.'5MG'$  TOTAL) BY MOUTHDAILY.   fexofenadine (ALLEGRA) 180 MG tablet Take 180 mg by mouth daily as needed for allergies.   gabapentin (NEURONTIN) 300 MG capsule Take 300 mg by mouth 3 (three) times daily.   pantoprazole (PROTONIX) 40 MG tablet Take 1 tablet (40 mg total) by mouth daily before breakfast.   Polyvinyl Alcohol-Povidone (REFRESH OP) Apply 1 drop to eye 3 (three) times daily.    Probiotic Product (ALIGN PO) Take 1 capsule by mouth daily. Alternates between [place] per PCP orders every few months   rosuvastatin (CRESTOR) 5 MG tablet Mon, Wed and Fri only.   TURMERIC PO Take 1 capsule by mouth daily.    UNABLE TO FIND Apply topically.   Wheat Dextrin (BENEFIBER) POWD Take 1 Dose by mouth daily.     Allergies:   Other, Sulfa antibiotics, Sulfonamide derivatives, Glycopyrrolate, Latex, Methscopolamine, Ace inhibitors, Codeine, Iodine, and Iohexol   Social History   Socioeconomic History   Marital status: Widowed    Spouse name: Not on file   Number of children: 1   Years of education: Not on file   Highest education level: Not on file  Occupational History   Occupation: Medical sales representative work part time    Employer: RETIRED  Tobacco Use   Smoking status: Former    Packs/day: 0.33    Types: Cigarettes   Smokeless tobacco: Never   Tobacco comments:    quit 1976  Vaping Use   Vaping Use: Never used  Substance and Sexual Activity   Alcohol use: Not Currently    Comment: very rare   Drug use: No   Sexual activity: Yes    Birth control/protection: Surgical  Other Topics Concern   Not on file  Social History Narrative   Retired, widowed, 1 child   Rare EtOH, former smoker none now   No drugs   Right handed   Social Determinants of Health   Financial Resource Strain: Not on file  Food Insecurity: Not on file  Transportation Needs: Not on file  Physical Activity: Not on file  Stress: Not on file  Social Connections: Not on file     Family History: The patient's family history includes Colon cancer (age of onset: 24) in her father; Colon polyps in her brother and brother; Osteoporosis in her mother; Prostate cancer in her brother; Stomach cancer in her maternal grandfather and maternal uncle; Thyroid disease in her mother. There is no history of Esophageal cancer, Pancreatic cancer, Liver disease, or Breast cancer.  ROS:   Review of  Systems  Constitutional:  Negative for diaphoresis, fever and malaise/fatigue.  HENT:  Negative for congestion, ear pain and hearing loss.   Eyes:  Negative for blurred vision and pain.  Respiratory:  Negative for cough, shortness of breath and wheezing.   Cardiovascular:  Positive for palpitations. Negative for chest pain, orthopnea, claudication, leg swelling and PND.  Gastrointestinal:  Negative for abdominal pain, blood in stool, constipation and diarrhea.  Genitourinary:  Negative for dysuria and hematuria.  Musculoskeletal:  Positive for myalgias. Negative for falls.  Neurological:  Positive for dizziness. Negative for tremors, loss of consciousness and headaches.  Endo/Heme/Allergies:  Does not bruise/bleed easily.  Psychiatric/Behavioral:  Negative for depression. The patient does not have insomnia.      EKGs/Labs/Other Studies Reviewed:    The following studies were reviewed today:  Bilateral Carotid Doppler  04/28/2021: IMPRESSION: 1. There is eccentric, hypoechoic/fibrofatty plaque in the left carotid bulb which results in less than 50% stenosis by Doppler criteria. 2. No significant atherosclerotic plaque appreciated in the right carotid circulation. 3. Bilateral vertebral arteries demonstrate antegrade flow.  Echo  12/04/2020:  1. Left ventricular ejection fraction, by estimation, is 60 to 65%. Left  ventricular ejection fraction by 3D volume is 64 %. The left ventricle has  normal function. The left ventricle has no regional wall motion  abnormalities. Left ventricular diastolic   parameters were normal.   2. Right ventricular systolic function is normal. The right ventricular  size is normal. There is normal pulmonary artery systolic pressure.   3. The mitral valve is normal in structure. Mild mitral valve  regurgitation. No evidence of mitral stenosis.   4. The aortic valve is tricuspid. Aortic valve regurgitation is not  visualized. No aortic stenosis is present.    5. The inferior vena cava is normal in size with greater than 50%  respiratory variability, suggesting right atrial pressure of 3 mmHg.   TTE 10/17/18 IMPRESSIONS   1. The left ventricle has normal systolic function, with an ejection fraction of 55-60%. The cavity size was normal. Left ventricular diastolic Doppler parameters are consistent with impaired relaxation.   2. The right ventricle has normal systolic function. The cavity was normal. There is no increase in right ventricular wall thickness.   3. Borderline bileaflet prolapse of the mitral valve leaflets with trivial central mitral regurgitation.   4. The aortic valve is tricuspid. Mild sclerosis of the aortic valve. No stenosis of the aortic valve.   5. The aortic root and ascending aorta are normal in size and structure.  30-day monitor 03/06/2018 sinus bradycardia to sinus tachycardia with 3 episodes of NSVT: 2 5 beats and one 6 beat   Cardiac MRI 03/2018 IMPRESSION: 1. Normal left ventricular size, thickness and systolic function (LVEF =10%). There is paradoxical septal motion. There is no late gadolinium enhancement in the left ventricular myocardium.   2. Normal right ventricular size, thickness and systolic function (LVEF = 63%). There are no regional wall motion abnormalities.   3.  Normal left and right atrial size.   4. Normal size of the aortic root, ascending aorta and pulmonary artery.   5. Anterior mitral valve leaflet prolapse with mitral regurgitation. Mild tricuspid regurgitation.   6.  Normal pericardium.  No pericardial effusion.   Normal cardiac MRI without evidence for inflammatory cardiomyopathy or ARVC.    Electronically Signed   By: Ena Dawley   On: 03/28/2018 19:07     CARDIAC CATH Final Conclusions 10/2013:   1. Normal coronary anatomy 2. Normal LV function   Recommendations: medical therapy.  EKG:  EKG is personally reviewed. 01/15/2022:  EKG was not ordered. 07/23/2021: NSR,  rate 74 bpm 11/13/2020: no ekg ordered.  Recent Labs: 10/20/2021: ALT 16; BUN 14; Creatinine, Ser 0.88; Hemoglobin 14.4; Platelets 286; Potassium 5.0; Sodium 142   Recent Lipid Panel    Component Value Date/Time   CHOL 180 10/20/2021 0813   TRIG 113 10/20/2021 0813   HDL 56 10/20/2021 0813   CHOLHDL 3.2 10/20/2021 0813   CHOLHDL 4.6 02/06/2013 0806   VLDL 33 02/06/2013 0806   LDLCALC 104 (H) 10/20/2021 0813     Risk Assessment/Calculations:  Physical Exam:    VS:  BP 118/74   Pulse 65   Ht '5\' 2"'$  (1.575 m)   Wt 131 lb (59.4 kg)   SpO2 98%   BMI 23.96 kg/m     Wt Readings from Last 3 Encounters:  01/15/22 131 lb (59.4 kg)  12/31/21 131 lb 12.8 oz (59.8 kg)  12/17/21 131 lb 3.2 oz (59.5 kg)     GEN:  Well nourished, well developed in no acute distress HEENT: Normal NECK: No JVD; No carotid bruits CARDIAC: RRR, no murmurs. RESPIRATORY:  Clear to auscultation without rales, wheezing or rhonchi  ABDOMEN: Soft, non-tender, non-distended MUSCULOSKELETAL:  No edema; No deformity  SKIN: Warm and dry NEUROLOGIC:  Alert and oriented x 3 PSYCHIATRIC:  Normal affect   ASSESSMENT:    1. Palpitations   2. Mixed hyperlipidemia   3. MVP (mitral valve prolapse)   4. NSVT (nonsustained ventricular tachycardia) ([place])   5. Essential hypertension      PLAN:    In order of problems listed above:  #Palpitations: #History of NSVT: Normal cath in 2015 and cardiac MRI 2019 normal. Overall well controlled on the atenolol, but if frequency increases, can change to metop -Continue atenolol 37.'5mg'$  daily  #MVP: Stable on TTE 12/2020 with mild MR.  -Continue serial monitoring  #HTN: Well controlled. No orthostatic symptoms -Continue atenolol 37.'5mg'$  daily  #HLD: -Crestor '5mg'$  M, W, F -LDL 104; she has resumed lifestyle modification now that hip pain improved so will continue to monitor at this time  #Non Hodgkins Lymphoma: Recurrent. On chronic  rituximab. -Management per Onc  Exercise recommendations: Goal of exercising for at least 30 minutes a day, at least 5 times per week.  Please exercise to a moderate exertion.  This means that while exercising it is difficult to speak in full sentences, however you are not so short of breath that you feel you must stop, and not so comfortable that you can carry on a full conversation.  Exertion level should be approximately a 5/10, if 10 is the most exertion you can perform.  Diet recommendations: Recommend a heart healthy diet such as the Mediterranean diet.  This diet consists of plant based foods, healthy fats, lean meats, olive oil.  It suggests limiting the intake of simple carbohydrates such as white breads, pastries, and pastas.  It also limits the amount of red meat, wine, and dairy products such as cheese that one should consume on a daily basis.     Follow up in 6 months.  Medication Adjustments/Labs and Tests Ordered: Current medicines are reviewed at length with the patient today.  Concerns regarding medicines are outlined above.   No orders of the defined types were placed in this encounter.  No orders of the defined types were placed in this encounter.  Patient Instructions  Medication Instructions:   Your physician recommends that you continue on your current medications as directed. Please refer to the Current Medication list given to you today.  *If you need a refill on your cardiac medications before your next appointment, please call your pharmacy*    Follow-Up: At [facility], you and your health needs are our priority.  As part of our continuing mission to provide you with exceptional heart care, we have created designated Provider Care Teams.  These Care Teams include your primary Cardiologist (physician) and Advanced Practice Providers (APPs -  Physician Assistants and Nurse Practitioners) who all work together to provide you with the care you need,  when  you need it.  We recommend signing up for the patient portal called "MyChart".  Sign up information is provided on this After Visit Summary.  MyChart is used to connect with patients for Virtual Visits (Telemedicine).  Patients are able to view lab/test results, encounter notes, upcoming appointments, etc.  Non-urgent messages can be sent to your provider as well.   To learn more about what you can do with MyChart, go to [email or web address].    Your next appointment:   6 month(s)  The format for your next appointment:   In Person  Provider:   Dr. Johney Frame   Important Information About Sugar        I,Mathew Stumpf,acting as a scribe for Freada Bergeron, MD.,have documented all relevant documentation on the behalf of Freada Bergeron, MD,as directed by  Freada Bergeron, MD while in the presence of Freada Bergeron, MD.  I, Freada Bergeron, MD, have reviewed all documentation for this visit. The documentation on 01/15/22 for the exam, diagnosis, procedures, and orders are all accurate and complete.   Signed, Freada Bergeron, MD  01/15/2022 9:59 AM    [place]

## 2022-01-15 ENCOUNTER — Encounter: Payer: Self-pay | Admitting: Cardiology

## 2022-01-15 ENCOUNTER — Ambulatory Visit: Payer: Medicare Other | Attending: Cardiology | Admitting: Cardiology

## 2022-01-15 VITALS — BP 118/74 | HR 65 | Ht 62.0 in | Wt 131.0 lb

## 2022-01-15 DIAGNOSIS — E782 Mixed hyperlipidemia: Secondary | ICD-10-CM | POA: Diagnosis not present

## 2022-01-15 DIAGNOSIS — I341 Nonrheumatic mitral (valve) prolapse: Secondary | ICD-10-CM | POA: Diagnosis not present

## 2022-01-15 DIAGNOSIS — I10 Essential (primary) hypertension: Secondary | ICD-10-CM | POA: Diagnosis not present

## 2022-01-15 DIAGNOSIS — R002 Palpitations: Secondary | ICD-10-CM

## 2022-01-15 DIAGNOSIS — I4729 Other ventricular tachycardia: Secondary | ICD-10-CM

## 2022-01-15 NOTE — Patient Instructions (Signed)
Medication Instructions:   Your physician recommends that you continue on your current medications as directed. Please refer to the Current Medication list given to you today.  *If you need a refill on your cardiac medications before your next appointment, please call your pharmacy*   Follow-Up: At [facility], you and your health needs are our priority.  As part of our continuing mission to provide you with exceptional heart care, we have created designated Provider Care Teams.  These Care Teams include your primary Cardiologist (physician) and Advanced Practice Providers (APPs -  Physician Assistants and Nurse Practitioners) who all work together to provide you with the care you need, when you need it.  We recommend signing up for the patient portal called "MyChart".  Sign up information is provided on this After Visit Summary.  MyChart is used to connect with patients for Virtual Visits (Telemedicine).  Patients are able to view lab/test results, encounter notes, upcoming appointments, etc.  Non-urgent messages can be sent to your provider as well.   To learn more about what you can do with MyChart, go to [email or web address].    Your next appointment:   6 month(s)  The format for your next appointment:   In Person  Provider:   Dr. Pemberton   Important Information About Sugar       

## 2022-01-26 DIAGNOSIS — Z23 Encounter for immunization: Secondary | ICD-10-CM | POA: Diagnosis not present

## 2022-02-16 ENCOUNTER — Inpatient Hospital Stay: Payer: PPO | Admitting: Oncology

## 2022-02-16 ENCOUNTER — Inpatient Hospital Stay: Payer: PPO | Attending: Oncology

## 2022-02-16 VITALS — BP 136/62 | HR 61 | Temp 98.1°F | Resp 18 | Ht 62.0 in | Wt 132.6 lb

## 2022-02-16 DIAGNOSIS — M8580 Other specified disorders of bone density and structure, unspecified site: Secondary | ICD-10-CM | POA: Insufficient documentation

## 2022-02-16 DIAGNOSIS — C8293 Follicular lymphoma, unspecified, intra-abdominal lymph nodes: Secondary | ICD-10-CM | POA: Insufficient documentation

## 2022-02-16 DIAGNOSIS — C8203 Follicular lymphoma grade I, intra-abdominal lymph nodes: Secondary | ICD-10-CM | POA: Diagnosis not present

## 2022-02-16 DIAGNOSIS — K589 Irritable bowel syndrome without diarrhea: Secondary | ICD-10-CM | POA: Diagnosis not present

## 2022-02-16 DIAGNOSIS — Z8719 Personal history of other diseases of the digestive system: Secondary | ICD-10-CM | POA: Insufficient documentation

## 2022-02-16 LAB — CBC WITH DIFFERENTIAL (CANCER CENTER ONLY)
Abs Immature Granulocytes: 0.02 10*3/uL (ref 0.00–0.07)
Basophils Absolute: 0.1 10*3/uL (ref 0.0–0.1)
Basophils Relative: 1 %
Eosinophils Absolute: 0.3 10*3/uL (ref 0.0–0.5)
Eosinophils Relative: 3 %
HCT: 40.8 % (ref 36.0–46.0)
Hemoglobin: 13.4 g/dL (ref 12.0–15.0)
Immature Granulocytes: 0 %
Lymphocytes Relative: 24 %
Lymphs Abs: 1.8 10*3/uL (ref 0.7–4.0)
MCH: 29.9 pg (ref 26.0–34.0)
MCHC: 32.8 g/dL (ref 30.0–36.0)
MCV: 91.1 fL (ref 80.0–100.0)
Monocytes Absolute: 1 10*3/uL (ref 0.1–1.0)
Monocytes Relative: 13 %
Neutro Abs: 4.6 10*3/uL (ref 1.7–7.7)
Neutrophils Relative %: 59 %
Platelet Count: 271 10*3/uL (ref 150–400)
RBC: 4.48 MIL/uL (ref 3.87–5.11)
RDW: 13 % (ref 11.5–15.5)
WBC Count: 7.8 10*3/uL (ref 4.0–10.5)
nRBC: 0 % (ref 0.0–0.2)

## 2022-02-16 LAB — CMP (CANCER CENTER ONLY)
ALT: 26 U/L (ref 0–44)
AST: 23 U/L (ref 15–41)
Albumin: 4.5 g/dL (ref 3.5–5.0)
Alkaline Phosphatase: 68 U/L (ref 38–126)
Anion gap: 8 (ref 5–15)
BUN: 19 mg/dL (ref 8–23)
CO2: 29 mmol/L (ref 22–32)
Calcium: 11.6 mg/dL — ABNORMAL HIGH (ref 8.9–10.3)
Chloride: 104 mmol/L (ref 98–111)
Creatinine: 0.77 mg/dL (ref 0.44–1.00)
GFR, Estimated: >60 mL/min (ref >60)
Glucose, Bld: 104 mg/dL — ABNORMAL HIGH (ref 70–99)
Potassium: 4.2 mmol/L (ref 3.5–5.1)
Sodium: 141 mmol/L (ref 135–145)
Total Bilirubin: 0.3 mg/dL (ref 0.3–1.2)
Total Protein: 6.5 g/dL (ref 6.5–8.1)

## 2022-02-16 LAB — LACTATE DEHYDROGENASE: LDH: 154 U/L (ref 98–192)

## 2022-02-16 NOTE — Progress Notes (Signed)
[facility] OFFICE PROGRESS NOTE   Diagnosis: Non-Hodgkin's pulm  INTERVAL HISTORY:   [name] [name] returns as scheduled.  She generally feels well.  Good appetite.  No fever.  She has occasional sweating at night.  She walks daily.  She received a "epidural "steroid injection for treatment of sciatica.  The sciatica partially improved. She is now followed by endocrinology for hypercalcemia.  She is felt to have primary hyperparathyroidism.  Objective:  Vital signs in last 24 hours:  Blood pressure 136/62, pulse 61, temperature 98.1 F (36.7 C), temperature source Oral, resp. rate 18, height '5\' 2"'$  (1.575 m), weight 132 lb 9.6 oz (60.1 kg), SpO2 100 %.    HEENT: Neck without mass Lymphatics: No cervical, supraclavicular, axillary, or inguinal nodes Resp: Lungs clear bilaterally Cardio: Regular rate and rhythm GI: No mass, nontender, no hepatosplenomegaly Vascular: No leg edema   Lab Results:  Lab Results  Component Value Date   WBC 7.8 02/16/2022   HGB 13.4 02/16/2022   HCT 40.8 02/16/2022   MCV 91.1 02/16/2022   PLT 271 02/16/2022   NEUTROABS 4.6 02/16/2022    CMP  Lab Results  Component Value Date   NA 141 02/16/2022   K 4.2 02/16/2022   CL 104 02/16/2022   CO2 29 02/16/2022   GLUCOSE 104 (H) 02/16/2022   BUN 19 02/16/2022   CREATININE 0.77 02/16/2022   CALCIUM 11.6 (H) 02/16/2022   PROT 6.5 02/16/2022   ALBUMIN 4.5 02/16/2022   AST 23 02/16/2022   ALT 26 02/16/2022   ALKPHOS 68 02/16/2022   BILITOT 0.3 02/16/2022   GFRNONAA >60 02/16/2022   GFRAA >60 01/17/2020    No results found for: "CEA1", "CEA", "CAN199", "CA125"  Lab Results  Component Value Date   INR 0.94 06/16/2018   LABPROT 12.5 06/16/2018    Imaging:  No results found.  Medications: I have reviewed the patient's current medications.   Assessment/Plan: Non-Hodgkin's lymphoma-follicular lymphoma Mesenteric lymph node biopsy 5/73/2202- follicular center cell  lymphoma, grade 2 Treated with mitoxantrone, fludarabine, and rituximab completed in January 2002 Progressive disease in February 2004, treated with single agent rituximab February 2004 through August 2005 CTs 06/21/2014- no evidence of lymphoma CT abdomen/pelvis 06/05/2018- enlarging left mid abdominal mesenteric mass CT biopsy of the mesenteric mass 06/16/2018-follicular lymphoma, low-grade, CD20 positive Cycle 1 weekly Rituxan 07/24/2018 Cycle 2 weekly Rituxan 07/31/2018  Cycle 3 weekly Rituxan 08/07/2018 Cycle 4 weekly Rituxan 08/14/2018 CT abdomen/pelvis 10/09/2018- reduction in size of left lower quadrant mesenteric mass Cycle 1 maintenance rituximab 10/19/2018 Cycle 2 maintenance Rituxan 12/14/2018 Cycle 3 maintenance Rituxan 02/08/2019 Cycle 4 maintenance Rituxan 04/09/2019 CT abdomen/pelvis 05/31/2019-no change in jejunal mesenteric mass, no evidence of disease progression. Cycle 5 maintenance rituximab 06/07/2019 Cycle 6 maintenance rituximab 08/02/2019 Cycle 7 maintenance rituximab 09/27/2019 CT abdomen/pelvis 11/20/2019-further contraction of the lesion at the small bowel mesentery, no evidence of progressive lymphoma Cycle 8 maintenance rituximab 11/22/2019 Cycle 9 maintenance rituximab 01/17/2020 Cycle 10 maintenance rituximab 03/13/2020 Cycle 11 maintenance rituximab 05/08/2020 Cycle 12 maintenance rituximab 07/03/2020 Cycle 13 maintenance rituximab 08/28/2020 CTs 10/28/2020-unchanged soft tissue in the left abdominal small bowel mesentery, no evidence of lymphadenopathy or recurrent disease elsewhere in the chest, abdomen, or pelvis Irritable bowel syndrome History of anal incontinence Osteopenia History of colon polyps Weakness, flushing, presyncope and chills during Rituxan infusion 07/24/2018. Mild hypercalcemia-chronic, diagnosed with mild primary hyperparathyroidism-followed by endocrinology COVID-19 04/10/2021 Zoster rash, right posterior lateral chest wall 04/30/2021        Disposition:  [name] [name] remains in clinical remission from non-Hodgkin's lymphoma.  She would like to have restaging CTs prior to the next office visit in 6 months.  This will be scheduled for a few days prior to a 69-monthoffice visit.  She will call for new symptoms.  GBetsy Coder MD  02/16/2022  2:56 PM

## 2022-04-09 ENCOUNTER — Other Ambulatory Visit: Payer: Self-pay | Admitting: Family Medicine

## 2022-04-09 ENCOUNTER — Telehealth: Payer: Self-pay | Admitting: Family Medicine

## 2022-04-09 MED ORDER — VALACYCLOVIR HCL 1 G PO TABS: 1000.0000 mg | ORAL_TABLET | Freq: Three times a day (TID) | ORAL | 0 refills | Status: AC

## 2022-04-09 NOTE — Telephone Encounter (Signed)
[name], DO     Rx sent for Valtrex

## 2022-04-09 NOTE — Telephone Encounter (Signed)
Patient notified

## 2022-04-09 NOTE — Telephone Encounter (Signed)
TYI she doesn't have shingles but has a feeling she is going to have a break out due to she is in pain on her back like last year. Please advise

## 2022-04-16 LAB — PTH, INTACT AND CALCIUM
Calcium: 10.7 mg/dL — ABNORMAL HIGH (ref 8.7–10.3)
PTH: 82 pg/mL — ABNORMAL HIGH (ref 15–65)

## 2022-04-16 LAB — MAGNESIUM: Magnesium: 1.9 mg/dL (ref 1.6–2.3)

## 2022-04-16 LAB — PHOSPHORUS: Phosphorus: 3.2 mg/dL (ref 3.0–4.3)

## 2022-05-06 ENCOUNTER — Encounter: Payer: Self-pay | Admitting: "Endocrinology

## 2022-05-06 ENCOUNTER — Ambulatory Visit: Payer: PPO | Admitting: "Endocrinology

## 2022-05-06 VITALS — BP 118/58 | HR 68 | Ht 62.0 in | Wt 131.6 lb

## 2022-05-06 DIAGNOSIS — M810 Age-related osteoporosis without current pathological fracture: Secondary | ICD-10-CM

## 2022-05-06 DIAGNOSIS — E210 Primary hyperparathyroidism: Secondary | ICD-10-CM

## 2022-05-06 NOTE — Progress Notes (Signed)
05/06/2022, 5:36 PM  Endocrinology follow-up note  [name] [name] is a 82 y.o.-year-old female, referred by her  [name], DO  , for evaluation for hypercalcemia/hyperparathyroidism.   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   Manometry 12/2015   Arthritis    Brady-tachy syndrome ([place])    Chest pain 10/16/13   normal coronary arteries on cath and normal EF   Colitis, ischemic ([place])    Colon polyp 07/29/1993   with focal adenomatous changes   Diverticulitis    Diverticulosis of colon    Dyspepsia    Dyssynergic defecation 12/31/2015   Anorectal mano 12/2015, also has rectal hypersensitivity   Fatty liver    GERD (gastroesophageal reflux disease)    Heart murmur    HLD (hyperlipidemia)    Hypertension    IBS (irritable bowel syndrome)    Incontinence, feces    MVP (mitral valve prolapse)    nhl dx'd 07/1999/ 06/2018   chemo comp 2001; rituxin comp 2005   Osteopenia    Palpitation    Rectocele    Rectocele    Vasovagal syncope     Past Surgical History:  Procedure Laterality Date   ABDOMINAL HYSTERECTOMY     ABDOMINAL SURGERY  2001    for non hodgkins lymphoma; lymphoma in [place] N/A 12/24/2015   Procedure: ANO RECTAL MANOMETRY;  Surgeon: Gatha Mayer, MD;  Location: [facility];  Service: Endoscopy;  Laterality: N/A;   BLADDER SUSPENSION     BUNIONECTOMY     CHOLECYSTECTOMY  2003   COLONOSCOPY  7/200/, 11/2007   diverticulosis   FINE NEEDLE ASPIRATION BIOPSY Left 06/16/2018   left mesentery tissue   LEFT HEART CATHETERIZATION WITH CORONARY ANGIOGRAM N/A 10/16/2013   Procedure: LEFT HEART CATHETERIZATION WITH CORONARY ANGIOGRAM;  Surgeon: Peter M Martinique, MD;  Location: [facility] CATH LAB;  Service: Cardiovascular;  Laterality: N/A;   pelvic prolapse repair     UPPER GASTROINTESTINAL ENDOSCOPY  10/2005   hiatal hernia    Social History    Tobacco Use   Smoking status: Former    Packs/day: 0.33    Types: Cigarettes   Smokeless tobacco: Never   Tobacco comments:    quit 1976  Vaping Use   Vaping Use: Never used  Substance Use Topics   Alcohol use: Not Currently    Comment: very rare   Drug use: No    Family History  Problem Relation Age of Onset   Thyroid disease Mother    Osteoporosis Mother    Colon cancer Father 2   Prostate cancer Brother    Colon polyps Brother    Colon polyps Brother    Stomach cancer Maternal Uncle    Stomach cancer Maternal Grandfather    Esophageal cancer Neg Hx    Pancreatic cancer Neg Hx    Liver disease Neg Hx    Breast cancer Neg Hx     Outpatient Encounter Medications as of 05/06/2022  Medication Sig   atenolol (TENORMIN) 25 MG tablet TAKE 1.5 TABLETS (37.'5MG'$  TOTAL) BY MOUTHDAILY.   fexofenadine (ALLEGRA) 180 MG  tablet Take 180 mg by mouth daily as needed for allergies.   pantoprazole (PROTONIX) 40 MG tablet Take 1 tablet (40 mg total) by mouth daily before breakfast.   Polyvinyl Alcohol-Povidone (REFRESH OP) Apply 1 drop to eye 3 (three) times daily.   Probiotic Product (ALIGN PO) Take 1 capsule by mouth daily. Alternates between [place] per PCP orders every few months   rosuvastatin (CRESTOR) 5 MG tablet Mon, Wed and Fri only.   TURMERIC PO Take 1 capsule by mouth daily.    UNABLE TO FIND Apply topically. Topical ointment for legs   Wheat Dextrin (BENEFIBER) POWD Take 1 Dose by mouth daily.   [DISCONTINUED] gabapentin (NEURONTIN) 300 MG capsule Take 300 mg by mouth 3 (three) times daily.   No facility-administered encounter medications on file as of 05/06/2022.    Allergies  Allergen Reactions   Other Anaphylaxis    Other reaction(s): Facial swelling And face swelling And face swelling    Sulfa Antibiotics Swelling and Anaphylaxis    And face swelling Face swelling    Sulfonamide Derivatives Swelling    Face swelling    Glycopyrrolate  Other (See Comments)    Unable to urinate Could not urinate after it was given   Latex Other (See Comments)    Blisters   Methscopolamine Other (See Comments)    Unable to urinate Unable to urinate   Ace Inhibitors Hives   Codeine Nausea Only   Iodine Hives   Iohexol Hives     Desc: 50 mg benadryl prior to exam      HPI  JENNIEFER [name] was diagnosed with hypercalcemia more than 5 years ago.  Patient has no previously known history of parathyroid, pituitary, adrenal dysfunctions; no family history of such dysfunctions.   She is on observation management for mild primary hyperparathyroidism.  Her previsit labs show calcium of 10.7 along with PTH of 82. . Prior to her last visit, her 24-hour urine calcium is 174. She has osteoporosis on ongoing treatment with Reclast every year for 6 years.  No prior history of fragility fractures or falls.  She reports loss of 2 inches of height.  No history of  kidney stones.  Does not have kidney problems.  she is not on HCTZ or other thiazide therapy.  Her vitamin D status is not known.  She is not on vitamin D supplement.  she is not on calcium supplements,  she eats dairy and green, leafy, vegetables on average amounts.  she does not have a family history of hypercalcemia, pituitary tumors, thyroid cancer, or osteoporosis.  Her other medical problems with hyperlipidemia on treatment lymphoma response to treatment with surgery.   ROS:  Constitutional: Minimally fluctuating body weight,  + fatigue, no subjective hyperthermia, no subjective hypothermia   PE: BP (!) 118/58   Pulse 68   Ht '5\' 2"'$  (1.575 m)   Wt 131 lb 9.6 oz (59.7 kg)   BMI 24.07 kg/m , Body mass index is 24.07 kg/m. Wt Readings from Last 3 Encounters:  05/06/22 131 lb 9.6 oz (59.7 kg)  02/16/22 132 lb 9.6 oz (60.1 kg)  01/15/22 131 lb (59.4 kg)       CMP ( most recent) CMP    Diabetic Labs (most recent): Lab Results  Component Value Date   HGBA1C 5.3  10/05/2019       Lab Results  Component Value Date   TSH 3.110 06/19/2019   TSH 3.010 11/21/2018   TSH 2.690  03/07/2015   TSH 2.85 08/29/2008    Recent Results (from the past 2160 hour(s))  Lactate dehydrogenase     Status: None   Collection Time: 02/16/22  2:03 PM  Result Value Ref Range   LDH 154 98 - 192 U/L    Comment: Performed at [facility], [address] [place], [place] [address]  [place] ([place] only)     Status: Abnormal   Collection Time: 02/16/22  2:03 PM  Result Value Ref Range   Sodium 141 135 - 145 mmol/L   Potassium 4.2 3.5 - 5.1 mmol/L   Chloride 104 98 - 111 mmol/L   CO2 29 22 - 32 mmol/L   Glucose, Bld 104 (H) 70 - 99 mg/dL    Comment: Glucose reference range applies only to samples taken after fasting for at least 8 hours.   BUN 19 8 - 23 mg/dL   Creatinine 0.77 0.44 - 1.00 mg/dL   Calcium 11.6 (H) 8.9 - 10.3 mg/dL   Total Protein 6.5 6.5 - 8.1 g/dL   Albumin 4.5 3.5 - 5.0 g/dL   AST 23 15 - 41 U/L   ALT 26 0 - 44 U/L   Alkaline Phosphatase 68 38 - 126 U/L   Total Bilirubin 0.3 0.3 - 1.2 mg/dL   GFR, Estimated >60 >60 mL/min    Comment: (NOTE) Calculated using the CKD-EPI Creatinine Equation (2021)    Anion gap 8 5 - 15    Comment: Performed at [facility], [address] [place],  [address]  CBC with Differential ([place] Only)     Status: None   Collection Time: 02/16/22  2:03 PM  Result Value Ref Range   WBC Count 7.8 4.0 - 10.5 K/uL   RBC 4.48 3.87 - 5.11 MIL/uL   Hemoglobin 13.4 12.0 - 15.0 g/dL   HCT 40.8 36.0 - 46.0 %   MCV 91.1 80.0 - 100.0 fL   MCH 29.9 26.0 - 34.0 pg   MCHC 32.8 30.0 - 36.0 g/dL   RDW 13.0 11.5 - 15.5 %   Platelet Count 271 150 - 400 K/uL   nRBC 0.0 0.0 - 0.2 %   Neutrophils Relative % 59 %   Neutro Abs 4.6 1.7 - 7.7 K/uL   Lymphocytes Relative 24 %   Lymphs Abs 1.8 0.7 - 4.0 K/uL   Monocytes Relative 13 %   Monocytes Absolute 1.0 0.1 - 1.0 K/uL    Eosinophils Relative 3 %   Eosinophils Absolute 0.3 0.0 - 0.5 K/uL   Basophils Relative 1 %   Basophils Absolute 0.1 0.0 - 0.1 K/uL   Immature Granulocytes 0 %   Abs Immature Granulocytes 0.02 0.00 - 0.07 K/uL    Comment: Performed at [facility], [place], [place] [address]  PTH, intact and calcium     Status: Abnormal   Collection Time: 04/15/22  2:11 PM  Result Value Ref Range   Calcium 10.7 (H) 8.7 - 10.3 mg/dL   PTH 82 (H) 15 - 65 pg/mL   PTH Interp Comment     Comment: Interpretation                 Intact PTH    Calcium                                 (pg/mL)      (mg/dL) Normal  15 - 65     8.6 - 10.2 Primary Hyperparathyroidism         >65          >10.2 Secondary Hyperparathyroidism       >65          <10.2 Non-Parathyroid Hypercalcemia       <65          >10.2 Hypoparathyroidism                  <15          < 8.6 Non-Parathyroid Hypocalcemia    15 - 65          < 8.6   Magnesium     Status: None   Collection Time: 04/15/22  2:11 PM  Result Value Ref Range   Magnesium 1.9 1.6 - 2.3 mg/dL  Phosphorus     Status: None   Collection Time: 04/15/22  2:11 PM  Result Value Ref Range   Phosphorus 3.2 3.0 - 4.3 mg/dL     Assessment: 1. Hypercalcemia / Hyperparathyroidism 2.  Osteoporosis   Plan: Patient has had several instances of elevated calcium, with the highest level being at 11.1 mg/dL.  Her most recent labs show calcium stable at 10.7 associated with PTH of 82. She will not need intervention by surgery or Sensipar at this time. Her history of osteoporosis may or may not be related to her hyperparathyroidism.  History likely that she has mild primary hyperparathyroidism. -  no history of  nephrolithiasis,  fragility fractures. No abdominal pain, no major mood disorders, no bone pain.  - I discussed with the patient about the physiology of calcium and parathyroid hormone, and possible  effects of  increased  PTH/ Calcium , including kidney stones, cardiac dysrhythmias, osteoporosis, abdominal pain, etc.   -24-hour urine calcium is not significant enough for nephrolithiasis concern.  It is unlikely that she has [place].      She will be kept on observation only until next measurement in 4-6 months.     Even if she is confirmed to have primary hyperparathyroidism, she is not a surgical candidate.  She will be considered for low-dose Sensipar if necessary on subsequent visits..  Regarding her osteoporosis, she is under the care of Dr. Leigh [facility]. Her most recent bone density from August 2022 showed -2.8 T score on dual femur which was unchanged from her 2020 study.  A T score of -2.4 on left forearm radius .  Lumbar spine was excluded due to advanced degenerative changes.  Her next bone density is due in August 2024. She is advised to continue Reclast until her next bone density.  Afterwards, she will be either on drug holidays or switch to Prolia.  This decision will depend on her next bone density findings.  Her next appointment must include distal third of radius    I spent 21 minutes in the care of the patient today including review of labs from Thyroid Function, CMP, and other relevant labs ; imaging/biopsy records (current and previous including abstractions from other facilities); face-to-face time discussing  her lab results and symptoms, medications doses, her options of short and long term treatment based on the latest standards of care / guidelines;   and documenting the encounter.  [name]  participated in the discussions, expressed understanding, and voiced agreement with the above plans.  All questions were answered to her satisfaction. she is encouraged to contact clinic should she have any  questions or concerns prior to her return visit.   - Return in about 6 months (around 11/04/2022) for F/U with Pre-visit Labs.   Glade Lloyd, MD [facility] [address] [place], [place] [address] Phone: [phone]  Fax: [phone]    This note was partially dictated with voice recognition software. Similar sounding words can be transcribed inadequately or may not  be corrected upon review.  05/06/2022, 5:36 PM

## 2022-06-01 DIAGNOSIS — L089 Local infection of the skin and subcutaneous tissue, unspecified: Secondary | ICD-10-CM | POA: Diagnosis not present

## 2022-06-02 ENCOUNTER — Telehealth: Payer: Self-pay

## 2022-06-02 NOTE — Telephone Encounter (Signed)
Patient made aware per drs recommendations

## 2022-06-02 NOTE — Telephone Encounter (Signed)
[name] [name] called said she feels like she is having a flare up of her diverticulitis and wants to see if medication can be called in.  Pharmacy [facility] in [place] [phone]

## 2022-06-02 NOTE — Telephone Encounter (Signed)
Office visit tomorrow morning with me at [phone]

## 2022-06-02 NOTE — Telephone Encounter (Signed)
Patient suspects is having a diverticulitis flare up. Exp low abdominal ache about left side, not constant but comes and goes started today, states ate cole slaw yesterday, she wanted to get ahead of her diverticulitis and start medication. Please advise

## 2022-06-03 ENCOUNTER — Ambulatory Visit (INDEPENDENT_AMBULATORY_CARE_PROVIDER_SITE_OTHER): Payer: PPO | Admitting: Family Medicine

## 2022-06-03 ENCOUNTER — Other Ambulatory Visit (HOSPITAL_COMMUNITY)
Admission: RE | Admit: 2022-06-03 | Discharge: 2022-06-03 | Disposition: A | Discharge status: Home / Self Care | Payer: PPO | Source: Ambulatory Visit | Attending: Family Medicine | Admitting: Family Medicine

## 2022-06-03 VITALS — BP 132/75 | Wt 132.6 lb

## 2022-06-03 DIAGNOSIS — R1030 Lower abdominal pain, unspecified: Secondary | ICD-10-CM | POA: Insufficient documentation

## 2022-06-03 LAB — COMPREHENSIVE METABOLIC PANEL
ALT: 46 U/L — ABNORMAL HIGH (ref 0–44)
AST: 36 U/L (ref 15–41)
Albumin: 4 g/dL (ref 3.5–5.0)
Alkaline Phosphatase: 79 U/L (ref 38–126)
Anion gap: 8 (ref 5–15)
BUN: 16 mg/dL (ref 8–23)
CO2: 26 mmol/L (ref 22–32)
Calcium: 10.6 mg/dL — ABNORMAL HIGH (ref 8.9–10.3)
Chloride: 103 mmol/L (ref 98–111)
Creatinine, Ser: 0.75 mg/dL (ref 0.44–1.00)
GFR, Estimated: >60 mL/min (ref >60)
Glucose, Bld: 92 mg/dL (ref 70–99)
Potassium: 4.4 mmol/L (ref 3.5–5.1)
Sodium: 137 mmol/L (ref 135–145)
Total Bilirubin: 0.7 mg/dL (ref 0.3–1.2)
Total Protein: 6.7 g/dL (ref 6.5–8.1)

## 2022-06-03 LAB — CBC WITH DIFFERENTIAL/PLATELET
Abs Immature Granulocytes: 0.03 10*3/uL (ref 0.00–0.07)
Basophils Absolute: 0.1 10*3/uL (ref 0.0–0.1)
Basophils Relative: 1 %
Eosinophils Absolute: 0.4 10*3/uL (ref 0.0–0.5)
Eosinophils Relative: 4 %
HCT: 41.8 % (ref 36.0–46.0)
Hemoglobin: 13.6 g/dL (ref 12.0–15.0)
Immature Granulocytes: 0 %
Lymphocytes Relative: 15 %
Lymphs Abs: 1.5 10*3/uL (ref 0.7–4.0)
MCH: 30.4 pg (ref 26.0–34.0)
MCHC: 32.5 g/dL (ref 30.0–36.0)
MCV: 93.5 fL (ref 80.0–100.0)
Monocytes Absolute: 1.1 10*3/uL — ABNORMAL HIGH (ref 0.1–1.0)
Monocytes Relative: 12 %
Neutro Abs: 6.7 10*3/uL (ref 1.7–7.7)
Neutrophils Relative %: 68 %
Platelets: 253 10*3/uL (ref 150–400)
RBC: 4.47 MIL/uL (ref 3.87–5.11)
RDW: 13.1 % (ref 11.5–15.5)
WBC: 9.8 10*3/uL (ref 4.0–10.5)
nRBC: 0 % (ref 0.0–0.2)

## 2022-06-03 NOTE — Progress Notes (Signed)
   Subjective:    Patient ID: [name] [name], female    DOB: November 23, 1940, 82 y.o.   MRN: [id number]  HPI Patient arrives today with diverticulitis flare up. Patient states it started last night.  Patient is concerned that she may have diverticulitis She states she felt like this several years ago when she did She states that over the past 24 hours a slight twinge of discomfort in her lower abdomen some on the left some on the right no vomiting with it no diarrhea with it.  No bloody stools.  She denies any fever chills or sweats.  She has been able to eat and drink.  It did not keep her up during the night.  She states she is feeling better today.  She does have a history of lymphoma being followed by oncology.  Review of Systems     Objective:   Physical Exam  General-in no acute distress Eyes-no discharge Lungs-respiratory rate normal, CTA CV-no murmurs,RRR Extremities skin warm dry no edema Neuro grossly normal Behavior normal, alert On abdominal exam she has minimal tenderness in the lower abdomen she describes it more as a full feeling in the lower abdomen.  No guarding or rebound.      Assessment & Plan:  Lower abdominal pain White blood count came back looking good Electrolytes look good 1 liver enzyme slightly elevated Calcium still elevated but this is been previously worked up I doubt that this is diverticulitis.  She is not having any worrisome symptoms right at the moment I did talk with her by phone on Thursday afternoon she feels that she is somewhat better she will give [place] an update tomorrow morning hold off on CAT scan currently  Will send a copy of this to her oncologist regarding slight elevation of liver enzyme on follow-up blood work if that becomes a trend it will need further working.  Certainly if she has worrisome symptoms or worsening symptoms over the next 1 to 3 days we will pursue forward with doing a CAT scan

## 2022-06-07 ENCOUNTER — Telehealth: Payer: Self-pay | Admitting: *Deleted

## 2022-06-07 NOTE — Telephone Encounter (Signed)
FYI: Patient called to let provider know she is doing well- pain is gone but if it returns she will reach back out to the office

## 2022-06-15 DIAGNOSIS — G245 Blepharospasm: Secondary | ICD-10-CM | POA: Diagnosis not present

## 2022-06-23 ENCOUNTER — Other Ambulatory Visit: Payer: Self-pay | Admitting: Internal Medicine

## 2022-07-13 DIAGNOSIS — H43392 Other vitreous opacities, left eye: Secondary | ICD-10-CM | POA: Diagnosis not present

## 2022-07-13 DIAGNOSIS — H43813 Vitreous degeneration, bilateral: Secondary | ICD-10-CM | POA: Diagnosis not present

## 2022-07-13 DIAGNOSIS — H268 Other specified cataract: Secondary | ICD-10-CM | POA: Diagnosis not present

## 2022-07-13 DIAGNOSIS — H2513 Age-related nuclear cataract, bilateral: Secondary | ICD-10-CM | POA: Diagnosis not present

## 2022-07-13 DIAGNOSIS — Z79899 Other long term (current) drug therapy: Secondary | ICD-10-CM | POA: Diagnosis not present

## 2022-07-13 DIAGNOSIS — H35033 Hypertensive retinopathy, bilateral: Secondary | ICD-10-CM | POA: Diagnosis not present

## 2022-07-13 DIAGNOSIS — H35342 Macular cyst, hole, or pseudohole, left eye: Secondary | ICD-10-CM | POA: Diagnosis not present

## 2022-07-13 DIAGNOSIS — H469 Unspecified optic neuritis: Secondary | ICD-10-CM | POA: Diagnosis not present

## 2022-07-16 ENCOUNTER — Other Ambulatory Visit: Payer: Self-pay

## 2022-07-16 MED ORDER — ATENOLOL 25 MG PO TABS
ORAL_TABLET | ORAL | 1 refills | Status: DC
Start: 2022-07-16 — End: 2023-01-05

## 2022-07-18 NOTE — Progress Notes (Signed)
Cardiology Office Note:    Date:  07/22/2022   ID:  AZILE ANTONOVICH, DOB 1941/02/19, MRN [id number]  PCP:  [name], DO   [facility] Cardiologist:  Ena Dawley, MD {   Referring MD: [name], DO    History of Present Illness:    [name] [name] is a 82 y.o. female with a hx of MVP, NSVT, HLD, HTN, and non Hodgkin's lymphoma on chronic Rituximab who was previously followed by Dr. Meda Coffee who now returns to clinic for follow-up.  Cath 2015 with normal coronary arteries. Holter with NSVT on atenolol. Cardiac MRI normal.   Was last seen in 01/2022 where she had intermittent palpitations but no other significant CV symptoms.  Today, the patient states that she overall feels okay. Has occasional chest discomfort that is not exertional in nature. Each episode last a couple of seconds and then resolves without intervention. Occasional lightheadedness with position changes but no syncope. She is trying to remain hydrated.  Otherwise, she is doing well with rare palpitations. No SOB, LE edema, orthopnea or PND. She walks 2 miles every morning without issue.   Blood pressures mainly 120s with rare readings in 140s.    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   Manometry 12/2015   Arthritis    Brady-tachy syndrome ([place])    Chest pain 10/16/13   normal coronary arteries on cath and normal EF   Colitis, ischemic ([place])    Colon polyp 07/29/1993   with focal adenomatous changes   Diverticulitis    Diverticulosis of colon    Dyspepsia    Dyssynergic defecation 12/31/2015   Anorectal mano 12/2015, also has rectal hypersensitivity   Fatty liver    GERD (gastroesophageal reflux disease)    Heart murmur    HLD (hyperlipidemia)    Hypertension    IBS (irritable bowel syndrome)    Incontinence, feces    MVP (mitral valve prolapse)    nhl dx'd 07/1999/ 06/2018   chemo comp 2001; rituxin comp 2005   Osteopenia    Palpitation     Rectocele    Rectocele    Vasovagal syncope     Past Surgical History:  Procedure Laterality Date   ABDOMINAL HYSTERECTOMY     ABDOMINAL SURGERY  2001    for non hodgkins lymphoma; lymphoma in [employer] N/A 12/24/2015   Procedure: ANO RECTAL MANOMETRY;  Surgeon: Gatha Mayer, MD;  Location: [facility];  Service: Endoscopy;  Laterality: N/A;   BLADDER SUSPENSION     BUNIONECTOMY     CHOLECYSTECTOMY  2003   COLONOSCOPY  7/200/, 11/2007   diverticulosis   FINE NEEDLE ASPIRATION BIOPSY Left 06/16/2018   left mesentery tissue   LEFT HEART CATHETERIZATION WITH CORONARY ANGIOGRAM N/A 10/16/2013   Procedure: LEFT HEART CATHETERIZATION WITH CORONARY ANGIOGRAM;  Surgeon: Peter M Martinique, MD;  Location: [facility] CATH LAB;  Service: Cardiovascular;  Laterality: N/A;   pelvic prolapse repair     UPPER GASTROINTESTINAL ENDOSCOPY  10/2005   hiatal hernia    Current Medications: Current Meds  Medication Sig   atenolol (TENORMIN) 25 MG tablet TAKE 1.5 TABLETS (37.5MG  TOTAL) BY MOUTHDAILY.   fexofenadine (ALLEGRA) 180 MG tablet Take 180 mg by mouth daily as needed for allergies.   pantoprazole (PROTONIX) 40 MG tablet TAKE 1 TABLET BY MOUTH ONCE DAILY BEFORE BREAKFAST   Polyvinyl Alcohol-Povidone (REFRESH OP) Apply 1 drop to eye 3 (three)  times daily.   predniSONE (DELTASONE) 50 MG tablet Take 50 mg 13 hours, 7 hours and 1 hour prior to CT scan. Also take Benadryl 50 mg 1 hour prior to study.   Probiotic Product (ALIGN PO) Take 1 capsule by mouth daily. Alternates between [place] per PCP orders every few months   rosuvastatin (CRESTOR) 5 MG tablet Mon, Wed and Fri only.   TURMERIC PO Take 1 capsule by mouth daily.    UNABLE TO FIND Apply topically. Topical ointment for legs   Wheat Dextrin (BENEFIBER) POWD Take 1 Dose by mouth daily.     Allergies:   Other, Sulfa antibiotics, Sulfonamide derivatives, Glycopyrrolate, Latex, Methscopolamine, Ace  inhibitors, Codeine, Iodine, and Iohexol   Social History   Socioeconomic History   Marital status: Widowed    Spouse name: Not on file   Number of children: 1   Years of education: Not on file   Highest education level: Not on file  Occupational History   Occupation: Medical sales representative work part time    Employer: RETIRED  Tobacco Use   Smoking status: Former    Packs/day: .33    Types: Cigarettes   Smokeless tobacco: Never   Tobacco comments:    quit 1976  Vaping Use   Vaping Use: Never used  Substance and Sexual Activity   Alcohol use: Not Currently    Comment: very rare   Drug use: No   Sexual activity: Yes    Birth control/protection: Surgical  Other Topics Concern   Not on file  Social History Narrative   Retired, widowed, 1 child   Rare EtOH, former smoker none now   No drugs   Right handed   Social Determinants of Health   Financial Resource Strain: Not on file  Food Insecurity: Not on file  Transportation Needs: Not on file  Physical Activity: Not on file  Stress: Not on file  Social Connections: Not on file     Family History: The patient's family history includes Colon cancer (age of onset: 63) in her father; Colon polyps in her brother and brother; Osteoporosis in her mother; Prostate cancer in her brother; Stomach cancer in her maternal grandfather and maternal uncle; Thyroid disease in her mother. There is no history of Esophageal cancer, Pancreatic cancer, Liver disease, or Breast cancer.  ROS:   Review of Systems  Constitutional:  Negative for diaphoresis, fever and malaise/fatigue.  HENT:  Negative for congestion, ear pain and hearing loss.   Eyes:  Negative for blurred vision and pain.  Respiratory:  Negative for cough, shortness of breath and wheezing.   Cardiovascular:  Positive for chest pain and palpitations. Negative for orthopnea, claudication, leg swelling and PND.  Gastrointestinal:  Negative for abdominal pain, blood in stool, constipation and  diarrhea.  Genitourinary:  Negative for dysuria and hematuria.  Musculoskeletal:  Positive for myalgias. Negative for falls.  Neurological:  Positive for dizziness. Negative for tremors, loss of consciousness and headaches.  Endo/Heme/Allergies:  Does not bruise/bleed easily.  Psychiatric/Behavioral:  Negative for depression. The patient does not have insomnia.      EKGs/Labs/Other Studies Reviewed:    The following studies were reviewed today:  Bilateral Carotid Doppler  04/28/2021: IMPRESSION: 1. There is eccentric, hypoechoic/fibrofatty plaque in the left carotid bulb which results in less than 50% stenosis by Doppler criteria. 2. No significant atherosclerotic plaque appreciated in the right carotid circulation. 3. Bilateral vertebral arteries demonstrate antegrade flow.  Echo  12/04/2020:  1. Left ventricular  ejection fraction, by estimation, is 60 to 65%. Left  ventricular ejection fraction by 3D volume is 64 %. The left ventricle has  normal function. The left ventricle has no regional wall motion  abnormalities. Left ventricular diastolic   parameters were normal.   2. Right ventricular systolic function is normal. The right ventricular  size is normal. There is normal pulmonary artery systolic pressure.   3. The mitral valve is normal in structure. Mild mitral valve  regurgitation. No evidence of mitral stenosis.   4. The aortic valve is tricuspid. Aortic valve regurgitation is not  visualized. No aortic stenosis is present.   5. The inferior vena cava is normal in size with greater than 50%  respiratory variability, suggesting right atrial pressure of 3 mmHg.   TTE 10/17/18 IMPRESSIONS   1. The left ventricle has normal systolic function, with an ejection fraction of 55-60%. The cavity size was normal. Left ventricular diastolic Doppler parameters are consistent with impaired relaxation.   2. The right ventricle has normal systolic function. The cavity was normal.  There is no increase in right ventricular wall thickness.   3. Borderline bileaflet prolapse of the mitral valve leaflets with trivial central mitral regurgitation.   4. The aortic valve is tricuspid. Mild sclerosis of the aortic valve. No stenosis of the aortic valve.   5. The aortic root and ascending aorta are normal in size and structure.  30-day monitor 03/06/2018 sinus bradycardia to sinus tachycardia with 3 episodes of NSVT: 2 5 beats and one 6 beat   Cardiac MRI 03/2018 IMPRESSION: 1. Normal left ventricular size, thickness and systolic function ([id number] [id number]). There is paradoxical septal motion. There is no late gadolinium enhancement in the left ventricular myocardium.   2. Normal right ventricular size, thickness and systolic function (LVEF = 63%). There are no regional wall motion abnormalities.   3.  Normal left and right atrial size.   4. Normal size of the aortic root, ascending aorta and pulmonary artery.   5. Anterior mitral valve leaflet prolapse with mitral regurgitation. Mild tricuspid regurgitation.   6.  Normal pericardium.  No pericardial effusion.   Normal cardiac MRI without evidence for inflammatory cardiomyopathy or ARVC.    Electronically Signed   By: Ena Dawley   On: 03/28/2018 19:07     CARDIAC CATH Final Conclusions 10/2013:   1. Normal coronary anatomy 2. Normal LV function   Recommendations: medical therapy.  EKG:  EKG is personally reviewed. NSR with LVH, HR 70  Recent Labs: 04/15/2022: Magnesium 1.9 06/03/2022: ALT 46; BUN 16; Creatinine, Ser 0.75; Hemoglobin 13.6; Platelets 253; Potassium 4.4; Sodium 137   Recent Lipid Panel    Component Value Date/Time   CHOL 180 10/20/2021 0813   TRIG 113 10/20/2021 0813   HDL 56 10/20/2021 0813   CHOLHDL 3.2 10/20/2021 0813   CHOLHDL 4.6 02/06/2013 0806   VLDL 33 02/06/2013 0806   LDLCALC 104 (H) 10/20/2021 0813     Risk Assessment/Calculations:           Physical Exam:    VS:  BP  (!) 148/68   Pulse 70   Ht 5\' 2"  (1.575 m)   Wt 131 lb 3.2 oz (59.5 kg)   SpO2 97%   BMI 24.00 kg/m     Wt Readings from Last 3 Encounters:  07/22/22 131 lb 3.2 oz (59.5 kg)  06/03/22 132 lb 9.6 oz (60.1 kg)  05/06/22 131 lb 9.6 oz (59.7 kg)  GEN:  Comfortable, NAD HEENT: Normal NECK: No JVD; No carotid bruits CARDIAC: RRR, 1/6 systolic murmur. No rubs or gallops. RESPIRATORY:  Clear to auscultation without rales, wheezing or rhonchi  ABDOMEN: Soft, non-tender, non-distended MUSCULOSKELETAL:  No edema; No deformity  SKIN: Warm and dry NEUROLOGIC:  Alert and oriented x 3 PSYCHIATRIC:  Normal affect   ASSESSMENT:    1. Palpitations   2. MVP (mitral valve prolapse)   3. NSVT (nonsustained ventricular tachycardia) ([place])   4. Essential hypertension   5. Mixed hyperlipidemia      PLAN:    In order of problems listed above:  #Palpitations: #History of NSVT: Normal cath in 2015 and cardiac MRI 2019 normal. Overall well controlled on the atenolol. -Continue atenolol 37.5mg  daily  #MVP: Stable on TTE 12/2020 with mild MR.  -Continue serial monitoring  #HTN: Generally well controlled at home. Will monitor at home.  -Continue atenolol 37.5mg  daily  #HLD: -Crestor 5mg  M, W, F -Will continue to work on lifestyle modifications  #Non Hodgkins Lymphoma: Recurrent. On chronic rituximab. -Management per Onc  Exercise recommendations: Goal of exercising for at least 30 minutes a day, at least 5 times per week.  Please exercise to a moderate exertion.  This means that while exercising it is difficult to speak in full sentences, however you are not so short of breath that you feel you must stop, and not so comfortable that you can carry on a full conversation.  Exertion level should be approximately a 5/10, if 10 is the most exertion you can perform.  Diet recommendations: Recommend a heart healthy diet such as the Mediterranean diet.  This diet consists of plant based  foods, healthy fats, lean meats, olive oil.  It suggests limiting the intake of simple carbohydrates such as white breads, pastries, and pastas.  It also limits the amount of red meat, wine, and dairy products such as cheese that one should consume on a daily basis.     Follow up in 6 months.  Medication Adjustments/Labs and Tests Ordered: Current medicines are reviewed at length with the patient today.  Concerns regarding medicines are outlined above.   Orders Placed This Encounter  Procedures   EKG 12-Lead   No orders of the defined types were placed in this encounter.  There are no Patient Instructions on file for this visit.   Signed, Freada Bergeron, MD  07/22/2022 1:46 PM    [place]

## 2022-07-19 ENCOUNTER — Telehealth: Payer: Self-pay | Admitting: *Deleted

## 2022-07-19 MED ORDER — PREDNISONE 50 MG PO TABS: ORAL_TABLET | ORAL | 0 refills | Status: DC

## 2022-07-19 NOTE — Telephone Encounter (Signed)
Left VM requesting premeds for CT scan be called to pharmacy for upcoming CT scan. Script sent.

## 2022-07-20 ENCOUNTER — Other Ambulatory Visit: Payer: Self-pay | Admitting: Internal Medicine

## 2022-07-22 ENCOUNTER — Encounter: Payer: Self-pay | Admitting: Cardiology

## 2022-07-22 ENCOUNTER — Ambulatory Visit: Payer: PPO | Attending: Cardiology | Admitting: Cardiology

## 2022-07-22 VITALS — BP 148/68 | HR 70 | Ht 62.0 in | Wt 131.2 lb

## 2022-07-22 DIAGNOSIS — I341 Nonrheumatic mitral (valve) prolapse: Secondary | ICD-10-CM | POA: Diagnosis not present

## 2022-07-22 DIAGNOSIS — R002 Palpitations: Secondary | ICD-10-CM

## 2022-07-22 DIAGNOSIS — I4729 Other ventricular tachycardia: Secondary | ICD-10-CM | POA: Diagnosis not present

## 2022-07-22 DIAGNOSIS — I10 Essential (primary) hypertension: Secondary | ICD-10-CM | POA: Diagnosis not present

## 2022-07-22 DIAGNOSIS — E782 Mixed hyperlipidemia: Secondary | ICD-10-CM

## 2022-07-22 NOTE — Patient Instructions (Signed)
Medication Instructions:   Your physician recommends that you continue on your current medications as directed. Please refer to the Current Medication list given to you today.  *If you need a refill on your cardiac medications before your next appointment, please call your pharmacy*     Follow-Up: At [facility], you and your health needs are our priority.  As part of our continuing mission to provide you with exceptional heart care, we have created designated Provider Care Teams.  These Care Teams include your primary Cardiologist (physician) and Advanced Practice Providers (APPs -  Physician Assistants and Nurse Practitioners) who all work together to provide you with the care you need, when you need it.  We recommend signing up for the patient portal called "MyChart".  Sign up information is provided on this After Visit Summary.  MyChart is used to connect with patients for Virtual Visits (Telemedicine).  Patients are able to view lab/test results, encounter notes, upcoming appointments, etc.  Non-urgent messages can be sent to your provider as well.   To learn more about what you can do with MyChart, go to [email or web address].    Your next appointment:   6 month(s)  Provider:   Dr. Pemberton   

## 2022-08-03 ENCOUNTER — Encounter: Payer: Self-pay | Admitting: Internal Medicine

## 2022-08-03 ENCOUNTER — Ambulatory Visit: Payer: PPO | Admitting: Internal Medicine

## 2022-08-03 VITALS — BP 126/60 | HR 72 | Ht 62.0 in | Wt 130.4 lb

## 2022-08-03 DIAGNOSIS — K219 Gastro-esophageal reflux disease without esophagitis: Secondary | ICD-10-CM | POA: Diagnosis not present

## 2022-08-03 DIAGNOSIS — K580 Irritable bowel syndrome with diarrhea: Secondary | ICD-10-CM

## 2022-08-03 DIAGNOSIS — K222 Esophageal obstruction: Secondary | ICD-10-CM | POA: Diagnosis not present

## 2022-08-03 DIAGNOSIS — N816 Rectocele: Secondary | ICD-10-CM | POA: Diagnosis not present

## 2022-08-03 DIAGNOSIS — R1319 Other dysphagia: Secondary | ICD-10-CM

## 2022-08-03 NOTE — Progress Notes (Signed)
WALDEAN FRITZLER 82 y.o. [birth year] [id number]  Assessment & Plan:   Encounter Diagnoses  Name Primary?   Esophageal dysphagia Yes   GERD with stricture    Irritable bowel syndrome with diarrhea    Rectocele     Evaluate and treat dysphagia with EGD and likely balloon dilation again.  The risks and benefits as well as alternatives of endoscopic procedure(s) have been discussed and reviewed. All questions answered. The patient agrees to proceed.   Continue current IBS regimen  Reassured regarding rectocele explained that if things were to worsen we could refer to urogynecology.  Informational handout attached to AVS.  CC: [name], DO  Subjective:   Chief Complaint: Dysphagia  HPI 82 yo ww hx non-hodgkin's lymphoma as well as a history of GERD with stricture, IBS, family history of colon cancer and pelvic floor issues plus rectocele who presents with complaints of recurrent dysphagia.  Several episodes of solid food dysphagia in the past few months.  Last dilation 2022 as below.  Heartburn under control.  Still has 1 day a month or so where she will have multiple loose bowel movements but otherwise regular defecation taking probiotic and fiber supplement.  Does bring up a history of rectocele she has had surgical repair of pelvic prolapse in the past, sometimes she feels like she will still have more defecation as she is a bit bloated and have some rectal pressure.  Wonders if it is okay to take a glycerin suppository which she has tried in the past with benefit.  She is fearful that the rectocele will deteriorate into something more serious like a "rupture".  She does not seem to have significant urinary issues.  EGD 06/2020 - 18 mm stricture dilation + 3 cm hiatal hernia Colonoscopy 10/2017 - diminutive cecal adenoma divericulosis  Allergies  Allergen Reactions   Other Anaphylaxis    Other reaction(s): Facial swelling And face swelling And face swelling    Sulfa  Antibiotics Swelling and Anaphylaxis    And face swelling Face swelling    Sulfonamide Derivatives Swelling    Face swelling    Glycopyrrolate Other (See Comments)    Unable to urinate Could not urinate after it was given   Latex Other (See Comments)    Blisters   Methscopolamine Other (See Comments)    Unable to urinate Unable to urinate   Ace Inhibitors Hives   Codeine Nausea Only   Iodine Hives   Iohexol Hives     Desc: 50 mg benadryl prior to exam    Current Meds  Medication Sig   atenolol (TENORMIN) 25 MG tablet TAKE 1.5 TABLETS (37.5MG  TOTAL) BY MOUTHDAILY.   fexofenadine (ALLEGRA) 180 MG tablet Take 180 mg by mouth daily as needed for allergies.   pantoprazole (PROTONIX) 40 MG tablet TAKE 1 TABLET BY MOUTH ONCE DAILY BEFORE BREAKFAST   Polyvinyl Alcohol-Povidone (REFRESH OP) Apply 1 drop to eye 3 (three) times daily.   Probiotic Product (ALIGN PO) Take 1 capsule by mouth daily. Alternates between [place] per PCP orders every few months   rosuvastatin (CRESTOR) 5 MG tablet Mon, Wed and Fri only.   TURMERIC PO Take 1 capsule by mouth daily.    UNABLE TO FIND Apply topically. Topical ointment for legs   Wheat Dextrin (BENEFIBER) POWD Take 1 Dose by mouth daily.   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   Manometry 12/2015   Arthritis  Brady-tachy syndrome    Chest pain 10/16/13   normal coronary arteries on cath and normal EF   Colitis, ischemic    Colon polyp 07/29/1993   with focal adenomatous changes   Diverticulitis    Diverticulosis of colon    Dyspepsia    Dyssynergic defecation 12/31/2015   Anorectal mano 12/2015, also has rectal hypersensitivity   Fatty liver    GERD (gastroesophageal reflux disease)    Heart murmur    HLD (hyperlipidemia)    Hypertension    IBS (irritable bowel syndrome)    Incontinence, feces    MVP (mitral valve prolapse)    nhl dx'd 07/1999/ 06/2018   chemo comp 2001;  rituxin comp 2005   Osteopenia    Palpitation    Rectocele    Rectocele    Vasovagal syncope    Past Surgical History:  Procedure Laterality Date   ABDOMINAL HYSTERECTOMY     ABDOMINAL SURGERY  2001    for non hodgkins lymphoma; lymphoma in [place] N/A 12/24/2015   Procedure: ANO RECTAL MANOMETRY;  Surgeon: Gatha Mayer, MD;  Location: [facility];  Service: Endoscopy;  Laterality: N/A;   BLADDER SUSPENSION     BUNIONECTOMY     CHOLECYSTECTOMY  2003   COLONOSCOPY  7/200/, 11/2007   diverticulosis   FINE NEEDLE ASPIRATION BIOPSY Left 06/16/2018   left mesentery tissue   LEFT HEART CATHETERIZATION WITH CORONARY ANGIOGRAM N/A 10/16/2013   Procedure: LEFT HEART CATHETERIZATION WITH CORONARY ANGIOGRAM;  Surgeon: Peter M Martinique, MD;  Location: [facility] CATH LAB;  Service: Cardiovascular;  Laterality: N/A;   pelvic prolapse repair     UPPER GASTROINTESTINAL ENDOSCOPY  10/2005   hiatal hernia   Social History   Social History Narrative   Retired, widowed, 1 child   Rare EtOH, former smoker none now   No drugs   Right handed   family history includes Colon cancer (age of onset: 91) in her father; Colon polyps in her brother and brother; Osteoporosis in her mother; Prostate cancer in her brother; Stomach cancer in her maternal grandfather and maternal uncle; Thyroid disease in her mother.   Review of Systems As per HPI surveillance CT scanning coming up soon with respect to non-Hodgkin's lymphoma, under the care of Dr. Benay Spice  Objective:   Physical Exam @BP  126/60 (BP Location: Left Arm, Patient Position: Sitting, Cuff Size: Normal)   Pulse 72   Ht 5\' 2"  (1.575 m)   Wt 130 lb 6 oz (59.1 kg)   BMI 23.85 kg/m @  General:  NAD Eyes:   anicteric Lungs:  clear Heart::  S1S2 no rubs, murmurs or gallops Abdomen:  soft and nontender, BS+ Ext:   no edema, cyanosis or clubbing    Data Reviewed:  See HPI

## 2022-08-03 NOTE — Patient Instructions (Addendum)
Rectocele  A rectocele is a bulging of the front wall of the rectum into the back wall of the vagina. The rectum is the bottom section of your colon (large intestine). This is a very common problem that often does not produce symptoms. Other pelvic organs can bulge into the vagina, including the bladder (cystocele) and the small intestines (enterocele), producing similar problems.  CAUSES Rectoceles are usually caused by thinning of the rectovaginal septum (the tissue between the rectum and vagina) and weakening of the pelvic floor muscles. There are many things that can lead to weakening of the pelvic floor, including: Vaginal deliveries  Trauma from vaginal delivery (e.g. the use of forceps or vacuum during delivery, tearing or episiotomy, which is a surgical cut in the muscular area between the vagina and the anus made just before delivery)  History of constipation  Chronic straining with bowel movements  Gynecological (e.g. hysterectomy) or rectal surgeries SYMPTOMS Most people with a small rectocele do not have symptoms. When the rectocele is large, there is usually a noticeable bulge into the vagina. Rectal Symptoms Difficulty having a complete bowel movement  Stool getting stuck in the bulge of the rectum  The need to press against the vagina and/or space between the rectum and the vagina to have a bowel movement  Straining with bowel movements  The urge to have multiple bowel movements throughout the day  Constipation  Rectal pain Vaginal Symptoms Pain with sexual intercourse (dyspareunia)  Vaginal bleeding  A sense of fullness in the vagina TREATMENT METHODS Rectocele treatment is needed only when they cause symptoms that interfere with daily living. Before any treatment, your physician will assess whether all your symptoms are related solely to the rectocele. There are nonsurgical and surgical treatment options for rectoceles. Most symptoms associated with a rectocele can be  resolved with nonsurgical treatment, however, this depends on the severity of symptoms. NONSURGICAL TREATMENT The goal is to have good daily bowel habits and softer stools. Avoiding constipation and straining with bowel movements will reduce the risk of a bulge associated with a rectocele. Preventive and Medical Tips Eating a high-fiber diet and taking over-the-counter fiber supplements (25-35 grams of fiber/day)  Drinking more water (typically 6-8 glasses daily)  Avoiding excessive straining with bowel movements  Applying pressure to the back of the vagina during bowel movements  Pelvic floor exercises such as Kegel  Biofeedback, a special form of pelvic floor physical therapy aimed at improving rectal sensation and pelvic floor muscle contraction  Stool softeners  Hormone replacement therapy SURGICAL TREATMENT The surgical management of rectoceles should only be considered when nonsurgical methods have not resolved or improved symptoms and the condition interferes with daily living. This can be done through abdominal, rectal or vaginal surgery. The choice of procedure depends on the size of the rectocele and its symptoms. The goal of surgery is: To remove the extra tissue caused by the rectocele  To strengthen the wall between the rectum and vagina with surrounding tissue or use of a mesh patch POSTSURGERY PROGNOSIS The success rate of surgery depends on the specific symptoms and their duration. Risks of surgical correction include bleeding, infection and pain during intercourse (dyspareunia). There is also the chance of the rectocele recurring or worsening.   You have been scheduled for an endoscopy. Please follow written instructions given to you at your visit today. If you use inhalers (even only as needed), please bring them with you on the day of your procedure.  I appreciate the  opportunity to care for you. Silvano Rusk, MD, [facility]

## 2022-08-05 ENCOUNTER — Other Ambulatory Visit (HOSPITAL_COMMUNITY): Payer: Self-pay | Admitting: Family Medicine

## 2022-08-05 DIAGNOSIS — Z1231 Encounter for screening mammogram for malignant neoplasm of breast: Secondary | ICD-10-CM

## 2022-08-05 DIAGNOSIS — M5416 Radiculopathy, lumbar region: Secondary | ICD-10-CM | POA: Diagnosis not present

## 2022-08-09 ENCOUNTER — Inpatient Hospital Stay: Payer: PPO | Attending: Oncology

## 2022-08-09 DIAGNOSIS — Z8572 Personal history of non-Hodgkin lymphomas: Secondary | ICD-10-CM | POA: Insufficient documentation

## 2022-08-09 DIAGNOSIS — E210 Primary hyperparathyroidism: Secondary | ICD-10-CM | POA: Diagnosis not present

## 2022-08-09 DIAGNOSIS — K589 Irritable bowel syndrome without diarrhea: Secondary | ICD-10-CM | POA: Diagnosis not present

## 2022-08-09 DIAGNOSIS — M8580 Other specified disorders of bone density and structure, unspecified site: Secondary | ICD-10-CM | POA: Diagnosis not present

## 2022-08-09 DIAGNOSIS — C8203 Follicular lymphoma grade I, intra-abdominal lymph nodes: Secondary | ICD-10-CM

## 2022-08-09 DIAGNOSIS — Z8719 Personal history of other diseases of the digestive system: Secondary | ICD-10-CM | POA: Insufficient documentation

## 2022-08-09 LAB — BASIC METABOLIC PANEL - CANCER CENTER ONLY
Anion gap: 6 (ref 5–15)
BUN: 16 mg/dL (ref 8–23)
CO2: 30 mmol/L (ref 22–32)
Calcium: 11.2 mg/dL — ABNORMAL HIGH (ref 8.9–10.3)
Chloride: 106 mmol/L (ref 98–111)
Creatinine: 0.77 mg/dL (ref 0.44–1.00)
GFR, Estimated: >60 mL/min (ref >60)
Glucose, Bld: 98 mg/dL (ref 70–99)
Potassium: 4.5 mmol/L (ref 3.5–5.1)
Sodium: 142 mmol/L (ref 135–145)

## 2022-08-10 ENCOUNTER — Other Ambulatory Visit: Payer: PPO

## 2022-08-10 ENCOUNTER — Ambulatory Visit (HOSPITAL_BASED_OUTPATIENT_CLINIC_OR_DEPARTMENT_OTHER)
Admission: RE | Admit: 2022-08-10 | Discharge: 2022-08-10 | Disposition: A | Discharge status: Home / Self Care | Payer: PPO | Source: Ambulatory Visit | Attending: Oncology | Admitting: Oncology

## 2022-08-10 ENCOUNTER — Encounter (HOSPITAL_BASED_OUTPATIENT_CLINIC_OR_DEPARTMENT_OTHER): Payer: Self-pay

## 2022-08-10 DIAGNOSIS — K449 Diaphragmatic hernia without obstruction or gangrene: Secondary | ICD-10-CM | POA: Diagnosis not present

## 2022-08-10 DIAGNOSIS — C8590 Non-Hodgkin lymphoma, unspecified, unspecified site: Secondary | ICD-10-CM | POA: Diagnosis not present

## 2022-08-10 DIAGNOSIS — C8203 Follicular lymphoma grade I, intra-abdominal lymph nodes: Secondary | ICD-10-CM | POA: Diagnosis not present

## 2022-08-10 DIAGNOSIS — K5730 Diverticulosis of large intestine without perforation or abscess without bleeding: Secondary | ICD-10-CM | POA: Diagnosis not present

## 2022-08-10 MED ORDER — IOHEXOL 300 MG/ML  SOLN
100.0000 mL | Freq: Once | INTRAMUSCULAR | Status: AC | PRN
  Administered 2022-08-10: 85 mL via INTRAVENOUS

## 2022-08-11 ENCOUNTER — Encounter: Payer: PPO | Admitting: Internal Medicine

## 2022-08-12 ENCOUNTER — Inpatient Hospital Stay: Payer: PPO | Admitting: Oncology

## 2022-08-12 VITALS — BP 135/72 | HR 60 | Temp 98.1°F | Resp 18 | Ht 62.0 in | Wt 131.2 lb

## 2022-08-12 DIAGNOSIS — C8203 Follicular lymphoma grade I, intra-abdominal lymph nodes: Secondary | ICD-10-CM

## 2022-08-12 DIAGNOSIS — Z8572 Personal history of non-Hodgkin lymphomas: Secondary | ICD-10-CM | POA: Diagnosis not present

## 2022-08-12 NOTE — Progress Notes (Signed)
[facility] OFFICE PROGRESS NOTE   Diagnosis: Non-Hodgkin's lymphoma  INTERVAL HISTORY:   Ms. Brecker returns as scheduled.  She feels well.  Good appetite and energy level.  No fever or night sweats.  No abdominal pain.  She has chronic "sciatica".  She is followed by orthopedics and receives spine injection therapy.  Objective:  Vital signs in last 24 hours:  Blood pressure 135/72, pulse 60, temperature 98.1 F (36.7 C), temperature source Oral, resp. rate 18, height 5\' 2"  (1.575 m), weight 131 lb 3.2 oz (59.5 kg), SpO2 100 %.     Lymphatics: No cervical, supraclavicular, axillary, or inguinal nodes Resp: Lungs clear bilaterally Cardio: Regular rate and rhythm GI: No hepatosplenomegaly, no mass, nontender Vascular: No leg edema   Lab Results:  Lab Results  Component Value Date   WBC 9.8 06/03/2022   HGB 13.6 06/03/2022   HCT 41.8 06/03/2022   MCV 93.5 06/03/2022   PLT 253 06/03/2022   NEUTROABS 6.7 06/03/2022    CMP  Lab Results  Component Value Date   NA 142 08/09/2022   K 4.5 08/09/2022   CL 106 08/09/2022   CO2 30 08/09/2022   GLUCOSE 98 08/09/2022   BUN 16 08/09/2022   CREATININE 0.77 08/09/2022   CALCIUM 11.2 (H) 08/09/2022   PROT 6.7 06/03/2022   ALBUMIN 4.0 06/03/2022   AST 36 06/03/2022   ALT 46 (H) 06/03/2022   ALKPHOS 79 06/03/2022   BILITOT 0.7 06/03/2022   GFRNONAA >60 08/09/2022   GFRAA >60 01/17/2020    No results found for: "CEA1", "CEA", "XYB338", "CA125"  Lab Results  Component Value Date   INR 0.94 06/16/2018   LABPROT 12.5 06/16/2018    Imaging:  CT CHEST ABDOMEN PELVIS W CONTRAST  Result Date: 08/11/2022 CLINICAL DATA:  Restaging non-Hodgkin's lymphoma. * Tracking Code: BO * EXAM: CT CHEST, ABDOMEN, AND PELVIS WITH CONTRAST TECHNIQUE: Multidetector CT imaging of the chest, abdomen and pelvis was performed following the standard protocol during bolus administration of intravenous contrast. RADIATION DOSE  REDUCTION: This exam was performed according to the departmental dose-optimization program which includes automated exposure control, adjustment of the mA and/or kV according to patient size and/or use of iterative reconstruction technique. CONTRAST:  44mL OMNIPAQUE IOHEXOL 300 MG/ML  SOLN COMPARISON:  10/28/2020 FINDINGS: CT CHEST FINDINGS Cardiovascular: The heart size appears within normal limits. No pericardial effusion. Mild aortic atherosclerosis and coronary artery calcification. Mediastinum/Nodes: Thyroid gland, trachea and esophagus are unremarkable. No mediastinal, hilar or axillary adenopathy. Lungs/Pleura: No pleural fluid or airspace disease. The central airways are patent. No suspicious lung nodules identified. Musculoskeletal: No chest wall mass. Spondylosis within the lower thoracic spine. No acute or suspicious osseous abnormality. CT ABDOMEN PELVIS FINDINGS Hepatobiliary: No focal liver abnormality is seen. Status post cholecystectomy. No biliary dilatation. Pancreas: Unremarkable. No pancreatic ductal dilatation or surrounding inflammatory changes. Spleen: Normal in size without focal abnormality. Adrenals/Urinary Tract: Normal adrenal glands. Bilateral renal cortical scarring. No nephrolithiasis, hydronephrosis, or suspicious mass. Bladder is normal. Stomach/Bowel: Small hiatal hernia. Stomach otherwise normal. The appendix is visualized and appears within normal limits. Sigmoid diverticulosis without acute diverticulitis. Vascular/Lymphatic: Aortic atherosclerosis. No abdominopelvic adenopathy. Unchanged ill-defined, post treatment appearance of soft tissue stranding in the left small bowel mesentery with adjacent surgical clips, image 82/2. This is compatible with treated tumor. Reproductive: Status post hysterectomy. No adnexal masses. Other: No ascites or focal fluid collections. Musculoskeletal: Lumbar degenerative disc disease. No acute or suspicious findings. IMPRESSION: 1. Stable post  treatment appearance of soft tissue stranding in the left small bowel mesentery compatible with treated tumor. No findings to suggest recurrent lymphoma. 2. Aortic Atherosclerosis (ICD10-I70.0). Electronically Signed   By: Signa Kell M.D.   On: 08/11/2022 10:47    Medications: I have reviewed the patient's current medications.   Assessment/Plan:  Non-Hodgkin's lymphoma-follicular lymphoma Mesenteric lymph node biopsy 07/15/1999- follicular center cell lymphoma, grade 2 Treated with mitoxantrone, fludarabine, and rituximab completed in January 2002 Progressive disease in February 2004, treated with single agent rituximab February 2004 through August 2005 CTs 06/21/2014- no evidence of lymphoma CT abdomen/pelvis 06/05/2018- enlarging left mid abdominal mesenteric mass CT biopsy of the mesenteric mass 06/16/2018-follicular lymphoma, low-grade, CD20 positive Cycle 1 weekly Rituxan 07/24/2018 Cycle 2 weekly Rituxan 07/31/2018  Cycle 3 weekly Rituxan 08/07/2018 Cycle 4 weekly Rituxan 08/14/2018 CT abdomen/pelvis 10/09/2018- reduction in size of left lower quadrant mesenteric mass Cycle 1 maintenance rituximab 10/19/2018 Cycle 2 maintenance Rituxan 12/14/2018 Cycle 3 maintenance Rituxan 02/08/2019 Cycle 4 maintenance Rituxan 04/09/2019 CT abdomen/pelvis 05/31/2019-no change in jejunal mesenteric mass, no evidence of disease progression. Cycle 5 maintenance rituximab 06/07/2019 Cycle 6 maintenance rituximab 08/02/2019 Cycle 7 maintenance rituximab 09/27/2019 CT abdomen/pelvis 11/20/2019-further contraction of the lesion at the small bowel mesentery, no evidence of progressive lymphoma Cycle 8 maintenance rituximab 11/22/2019 Cycle 9 maintenance rituximab 01/17/2020 Cycle 10 maintenance rituximab 03/13/2020 Cycle 11 maintenance rituximab 05/08/2020 Cycle 12 maintenance rituximab 07/03/2020 Cycle 13 maintenance rituximab 08/28/2020 CTs 10/28/2020-unchanged soft tissue in the left abdominal small bowel mesentery, no  evidence of lymphadenopathy or recurrent disease elsewhere in the chest, abdomen, or pelvis CTs 08/10/2022-stable posttreatment appearance of soft tissue in the left small bowel mesentery, no evidence of recurrent lymphoma Irritable bowel syndrome History of anal incontinence Osteopenia History of colon polyps Weakness, flushing, presyncope and chills during Rituxan infusion 07/24/2018. Mild hypercalcemia-chronic, diagnosed with mild primary hyperparathyroidism-followed by endocrinology COVID-19 04/10/2021 Zoster rash, right posterior lateral chest wall 04/30/2021       Disposition: [name] [name] remains in clinical remission from non-Hodgkin's lymphoma.  She will return for an office visit in 6 months.  She continues follow-up with endocrinology for management of hyperparathyroidism.  She will call for new symptoms.  Thornton Papas, MD  08/12/2022  3:33 PM

## 2022-08-20 DIAGNOSIS — M5451 Vertebrogenic low back pain: Secondary | ICD-10-CM | POA: Diagnosis not present

## 2022-08-20 DIAGNOSIS — M5136 Other intervertebral disc degeneration, lumbar region: Secondary | ICD-10-CM | POA: Diagnosis not present

## 2022-08-20 DIAGNOSIS — M48061 Spinal stenosis, lumbar region without neurogenic claudication: Secondary | ICD-10-CM | POA: Diagnosis not present

## 2022-08-24 ENCOUNTER — Encounter: Payer: Self-pay | Admitting: Internal Medicine

## 2022-08-24 ENCOUNTER — Ambulatory Visit (AMBULATORY_SURGERY_CENTER): Payer: PPO | Admitting: Internal Medicine

## 2022-08-24 VITALS — BP 120/71 | HR 63 | Temp 97.3°F | Resp 10 | Ht 62.0 in | Wt 130.0 lb

## 2022-08-24 DIAGNOSIS — K222 Esophageal obstruction: Secondary | ICD-10-CM | POA: Diagnosis not present

## 2022-08-24 DIAGNOSIS — K449 Diaphragmatic hernia without obstruction or gangrene: Secondary | ICD-10-CM | POA: Diagnosis not present

## 2022-08-24 DIAGNOSIS — R1310 Dysphagia, unspecified: Secondary | ICD-10-CM | POA: Diagnosis not present

## 2022-08-24 DIAGNOSIS — R1319 Other dysphagia: Secondary | ICD-10-CM

## 2022-08-24 MED ORDER — SODIUM CHLORIDE 0.9 % IV SOLN: 500.0000 mL | INTRAVENOUS | Status: DC

## 2022-08-24 NOTE — Progress Notes (Signed)
History and Physical Interval Note:  08/24/2022 10:39 AM  [name] [name]  has presented today for endoscopic procedure(s), with the diagnosis of  Encounter Diagnosis  Name Primary?   Esophageal dysphagia Yes  .  The various methods of evaluation and treatment have been discussed with the patient and/or family. After consideration of risks, benefits and other options for treatment, the patient has consented to  the endoscopic procedure(s).   The patient's history has been reviewed, patient examined, no change in status, stable for endoscopic procedure(s).  I have reviewed the patient's chart and labs.  Questions were answered to the patient's satisfaction.     Iva Boop, MD, [facility]

## 2022-08-24 NOTE — Progress Notes (Signed)
Pt's states no medical or surgical changes since previsit or office visit. 

## 2022-08-24 NOTE — Progress Notes (Signed)
Uneventful anesthetic. Report to pacu rn. Vss. Care resumed by rn. 

## 2022-08-24 NOTE — Op Note (Signed)
[facility] Patient Name: [name] [name] Procedure Date: 08/24/2022 10:25 AM MRN: [id number] Endoscopist: Iva Boop , MD, [id number] Age: 82 Referring MD:  Date of Birth: [birth year] Gender: Female Account #: [id number] Procedure:                Upper GI endoscopy Indications:              Dysphagia Medicines:                Monitored Anesthesia Care Procedure:                Pre-Anesthesia Assessment:                           - Prior to the procedure, a History and Physical                            was performed, and patient medications and                            allergies were reviewed. The patient's tolerance of                            previous anesthesia was also reviewed. The risks                            and benefits of the procedure and the sedation                            options and risks were discussed with the patient.                            All questions were answered, and informed consent                            was obtained. Prior Anticoagulants: The patient has                            taken no anticoagulant or antiplatelet agents. ASA                            Grade Assessment: III - A patient with severe                            systemic disease. After reviewing the risks and                            benefits, the patient was deemed in satisfactory                            condition to undergo the procedure.                           After obtaining informed consent, the endoscope was  passed under direct vision. Throughout the                            procedure, the patient's blood pressure, pulse, and                            oxygen saturations were monitored continuously. The                            GIF [id number] #[id number] was introduced through the                            mouth, and advanced to the second part of duodenum.                            The upper GI endoscopy was accomplished  without                            difficulty. The patient tolerated the procedure                            well. Scope In: Scope Out: Findings:                 One benign-appearing, intrinsic moderate                            (circumferential scarring or stenosis; an endoscope                            may pass) stenosis was found at the                            gastroesophageal junction. The stenosis was                            traversed. A TTS dilator was passed through the                            scope. Dilation with an 18-19-20 mm balloon dilator                            was performed to 20 mm. The dilation site was                            examined and showed mild mucosal disruption.                            Estimated blood loss was minimal.                           A 3 cm hiatal hernia was present.                           The exam was otherwise without abnormality.  The gastroesophageal flap valve was visualized                            endoscopically and classified as Hill Grade IV (no                            fold, wide open lumen, hiatal hernia present). Complications:            No immediate complications. Estimated Blood Loss:     Estimated blood loss was minimal. Impression:               - Benign-appearing esophageal stenosis. Dilated. 20                            mm                           - 3 cm hiatal hernia.                           - The examination was otherwise normal.                           - Gastroesophageal flap valve classified as Hill                            Grade IV (no fold, wide open lumen, hiatal hernia                            present).                           - No specimens collected. Recommendation:           - Patient has a contact number available for                            emergencies. The signs and symptoms of potential                            delayed complications were  discussed with the                            patient. Return to normal activities tomorrow.                            Written discharge instructions were provided to the                            patient.                           - Clear liquids x 1 hour then soft foods rest of                            day. Start prior diet tomorrow.                           -  Continue present medications.                           - Repeat upper endoscopy PRN for retreatment. Iva Boop, MD 08/24/2022 11:04:17 AM This report has been signed electronically.

## 2022-08-24 NOTE — Progress Notes (Signed)
Called to room to assist during endoscopic procedure.  Patient ID and intended procedure confirmed with present staff. Received instructions for my participation in the procedure from the performing physician.  

## 2022-08-24 NOTE — Patient Instructions (Addendum)
I dilated the stricture again - that should help. Let me know if not.  I appreciate the opportunity to care for you. Iva Boop, MD, [facility]  Handouts provided on hiatal hernia and post dilation diet.  Continue present medications.  Repeat upper endoscopy as needed for retreatment.   YOU HAD AN ENDOSCOPIC PROCEDURE TODAY AT THE [facility]:   Refer to the procedure report that was given to you for any specific questions about what was found during the examination.  If the procedure report does not answer your questions, please call your gastroenterologist to clarify.  If you requested that your care partner not be given the details of your procedure findings, then the procedure report has been included in a sealed envelope for you to review at your convenience later.  YOU SHOULD EXPECT: Some feelings of bloating in the abdomen. Passage of more gas than usual.  Walking can help get rid of the air that was put into your GI tract during the procedure and reduce the bloating. If you had a lower endoscopy (such as a colonoscopy or flexible sigmoidoscopy) you may notice spotting of blood in your stool or on the toilet paper. If you underwent a bowel prep for your procedure, you may not have a normal bowel movement for a few days.  Please Note:  You might notice some irritation and congestion in your nose or some drainage.  This is from the oxygen used during your procedure.  There is no need for concern and it should clear up in a day or so.  SYMPTOMS TO REPORT IMMEDIATELY:  Following upper endoscopy (EGD)  Vomiting of blood or coffee ground material  New chest pain or pain under the shoulder blades  Painful or persistently difficult swallowing  New shortness of breath  Fever of 100F or higher  Black, tarry-looking stools  For urgent or emergent issues, a gastroenterologist can be reached at any hour by calling [phone]. Do not use MyChart messaging for urgent concerns.     DIET:  Post dilation diet: Clear liquids for 1 hour (until 12pm). Then, a Soft diet (see handout) for the rest of today. Start prior diet tomorrow.  Drink plenty of fluids but you should avoid alcoholic beverages for 24 hours.  ACTIVITY:  You should plan to take it easy for the rest of today and you should NOT DRIVE or use heavy machinery until tomorrow (because of the sedation medicines used during the test).    FOLLOW UP: Our staff will call the number listed on your records the next business day following your procedure.  We will call around 7:15- 8:00 am to check on you and address any questions or concerns that you may have regarding the information given to you following your procedure. If we do not reach you, we will leave a message.     If any biopsies were taken you will be contacted by phone or by letter within the next 1-3 weeks.  Please call us at [phone] if you have not heard about the biopsies in 3 weeks.    SIGNATURES/CONFIDENTIALITY: You and/or your care partner have signed paperwork which will be entered into your electronic medical record.  These signatures attest to the fact that that the information above on your After Visit Summary has been reviewed and is understood.  Full responsibility of the confidentiality of this discharge information lies with you and/or your care-partner.

## 2022-08-25 ENCOUNTER — Telehealth: Payer: Self-pay

## 2022-08-25 NOTE — Telephone Encounter (Signed)
  Follow up Call-     08/24/2022    9:29 AM  Call back number  Post procedure Call Back phone  # [phone]  Permission to leave phone message Yes     Patient questions:  Do you have a fever, pain , or abdominal swelling? No. Pain Score  0 *  Have you tolerated food without any problems? Yes.    Have you been able to return to your normal activities? Yes.    Do you have any questions about your discharge instructions: Diet   No. Medications  No. Follow up visit  No.  Do you have questions or concerns about your Care? No.  Actions: * If pain score is 4 or above: No action needed, pain <4.

## 2022-09-10 ENCOUNTER — Ambulatory Visit (HOSPITAL_COMMUNITY)
Admission: RE | Admit: 2022-09-10 | Discharge: 2022-09-10 | Disposition: A | Discharge status: Home / Self Care | Payer: PPO | Source: Ambulatory Visit | Attending: Family Medicine | Admitting: Family Medicine

## 2022-09-10 ENCOUNTER — Encounter (HOSPITAL_COMMUNITY): Payer: Self-pay

## 2022-09-10 DIAGNOSIS — Z1231 Encounter for screening mammogram for malignant neoplasm of breast: Secondary | ICD-10-CM | POA: Insufficient documentation

## 2022-09-15 DIAGNOSIS — H04123 Dry eye syndrome of bilateral lacrimal glands: Secondary | ICD-10-CM | POA: Diagnosis not present

## 2022-10-12 ENCOUNTER — Other Ambulatory Visit: Payer: Self-pay

## 2022-10-12 MED ORDER — ROSUVASTATIN CALCIUM 5 MG PO TABS: ORAL_TABLET | ORAL | 2 refills | Status: DC

## 2022-10-25 ENCOUNTER — Encounter: Payer: PPO | Admitting: Family Medicine

## 2022-10-25 ENCOUNTER — Encounter: Payer: PPO | Admitting: Nurse Practitioner

## 2022-10-28 ENCOUNTER — Other Ambulatory Visit (INDEPENDENT_AMBULATORY_CARE_PROVIDER_SITE_OTHER): Payer: PPO

## 2022-10-28 ENCOUNTER — Ambulatory Visit (INDEPENDENT_AMBULATORY_CARE_PROVIDER_SITE_OTHER): Payer: PPO | Admitting: Nurse Practitioner

## 2022-10-28 VITALS — BP 127/80 | HR 97 | Wt 130.2 lb

## 2022-10-28 DIAGNOSIS — R8290 Unspecified abnormal findings in urine: Secondary | ICD-10-CM | POA: Diagnosis not present

## 2022-10-28 DIAGNOSIS — I10 Essential (primary) hypertension: Secondary | ICD-10-CM

## 2022-10-28 DIAGNOSIS — E213 Hyperparathyroidism, unspecified: Secondary | ICD-10-CM

## 2022-10-28 DIAGNOSIS — R300 Dysuria: Secondary | ICD-10-CM | POA: Diagnosis not present

## 2022-10-28 DIAGNOSIS — E782 Mixed hyperlipidemia: Secondary | ICD-10-CM

## 2022-10-28 DIAGNOSIS — Z0000 Encounter for general adult medical examination without abnormal findings: Secondary | ICD-10-CM | POA: Diagnosis not present

## 2022-10-28 DIAGNOSIS — G479 Sleep disorder, unspecified: Secondary | ICD-10-CM

## 2022-10-28 LAB — POCT URINALYSIS DIP (CLINITEK)
Bilirubin, UA: NEGATIVE
Blood, UA: NEGATIVE
Glucose, UA: NEGATIVE mg/dL
Ketones, POC UA: NEGATIVE mg/dL
Leukocytes, UA: NEGATIVE
Nitrite, UA: NEGATIVE
POC PROTEIN,UA: NEGATIVE
Spec Grav, UA: 1.025 (ref 1.010–1.025)
Urobilinogen, UA: 0.2 E.U./dL
pH, UA: 6 (ref 5.0–8.0)

## 2022-10-28 NOTE — Progress Notes (Signed)
Subjective:    Patient ID: [name] [name], female    DOB: 1940/05/31, 82 y.o.   MRN: [id number]  HPI AWV- Annual Wellness Visit  The patient was seen for their annual wellness visit. The patient's past medical history, surgical history, and family history were reviewed. Pertinent vaccines were reviewed ( tetanus, pneumonia, shingles, flu) The patient's medication list was reviewed and updated.  The height and weight were entered.  BMI recorded in electronic record elsewhere  Cognitive screening was completed. Outcome of Mini - Cog: 5  Falls /depression screening electronically recorded within record elsewhere  Current tobacco usage:no (All patients who use tobacco were given written and verbal information on quitting)  Recent listing of emergency department/hospitalizations over the past year were reviewed.  current specialist the patient sees on a regular basis: orthopedic, gastroenterology, Oncology     Medicare annual wellness visit patient questionnaire was reviewed.  A written screening schedule for the patient for the next 5-10 years was given. Appropriate discussion of followup regarding next visit was discussed. Patient is widowed, no new sexual partners.  Overall tries to eat healthy.  Walks at least 2 miles per day.  Has had issues with sciatica, has received physical therapy and sees a specialist for this. Had endoscopy 08/24/2022.  Follow-up with gastroenterology.  Has had all of her vaccines including RSV.  Mammogram up-to-date.  Her endocrinologist orders her bone density with follow-up.  Regular vision and dental exams.  Regular follow-up with oncology. Takes a rare Xanax as needed for sleep.  Has not had a prescription in over a year. Has had a hysterectomy.  Denies any pelvic pain.  Denies any vulvar rash itching irritation or discoloration.  No vaginal discharge.    10/28/2022    1:15 PM  Depression screen PHQ 2/9  Decreased Interest 0  Down, Depressed,  Hopeless 0  PHQ - 2 Score 0  Altered sleeping 1  Tired, decreased energy 0  Change in appetite 0  Feeling bad or failure about yourself  0  Trouble concentrating 0  Moving slowly or fidgety/restless 0  Suicidal thoughts 0  PHQ-9 Score 1  Difficult doing work/chores Not difficult at all      10/28/2022    1:16 PM 06/03/2022    9:36 AM  GAD 7 : Generalized Anxiety Score  Nervous, Anxious, on Edge 0 0  Control/stop worrying 0 0  Worry too much - different things 0 0  Trouble relaxing 0 0  Restless 0 0  Easily annoyed or irritable 0 0  Afraid - awful might happen 0 0  Total GAD 7 Score 0 0  Anxiety Difficulty Not difficult at all      Review of Systems  Constitutional:  Positive for fatigue. Negative for activity change and appetite change.       Some fatigue at times.  Stays physically active.  Stay socially active.  HENT:  Negative for sore throat and trouble swallowing.   Respiratory:  Negative for cough, chest tightness, shortness of breath and wheezing.   Cardiovascular:  Negative for chest pain and leg swelling.  Gastrointestinal:  Negative for abdominal distention, abdominal pain, blood in stool, constipation, diarrhea, nausea and vomiting.  Genitourinary:  Negative for difficulty urinating, dysuria, enuresis, frequency, genital sores, pelvic pain, urgency, vaginal bleeding and vaginal discharge.       Objective:   Physical Exam Vitals and nursing note reviewed.  Constitutional:      General: She is not in acute distress.  Appearance: She is well-developed.  Neck:     Thyroid: No thyromegaly.     Trachea: No tracheal deviation.     Comments: Thyroid non tender to palpation. No mass or goiter noted.  Cardiovascular:     Rate and Rhythm: Normal rate and regular rhythm.     Heart sounds: Normal heart sounds. No murmur heard. Pulmonary:     Effort: Pulmonary effort is normal.     Breath sounds: Normal breath sounds.  Chest:  Breasts:    Right: No swelling,  inverted nipple, mass, skin change or tenderness.     Left: No swelling, inverted nipple, mass, skin change or tenderness.  Abdominal:     General: There is no distension.     Palpations: Abdomen is soft.     Tenderness: There is no abdominal tenderness.  Genitourinary:    Comments: Defers GU exam, denies any problems. Musculoskeletal:     Cervical back: Normal range of motion and neck supple.  Lymphadenopathy:     Cervical: No cervical adenopathy.     Upper Body:     Right upper body: No supraclavicular, axillary or pectoral adenopathy.     Left upper body: No supraclavicular, axillary or pectoral adenopathy.  Skin:    General: Skin is warm and dry.  Neurological:     Mental Status: She is alert and oriented to person, place, and time.  Psychiatric:        Mood and Affect: Mood normal.        Behavior: Behavior normal.        Thought Content: Thought content normal.        Judgment: Judgment normal.    Today's Vitals   10/28/22 1321  BP: 127/80  Pulse: 97  SpO2: 95%  Weight: 130 lb 3.2 oz (59.1 kg)   Body mass index is 23.81 kg/m.        Assessment & Plan:   Problem List Items Addressed This Visit       Cardiovascular and Mediastinum   HTN (hypertension)   Relevant Orders   Lipid panel   Comprehensive metabolic panel   Comprehensive metabolic panel     Other   Hyperlipidemia   Relevant Orders   Lipid panel   Comprehensive metabolic panel   Other Visit Diagnoses     Encounter for subsequent annual wellness visit in Medicare patient    -  Primary   Relevant Orders   Lipid panel   Comprehensive metabolic panel   CBC with Differential   PTH, Intact and Calcium   PTH-Related Peptide   Comprehensive metabolic panel   Cloudy urine       Sleep disturbance       Relevant Medications   ALPRAZolam (XANAX) 0.25 MG tablet   Hyperparathyroidism (HCC)       Relevant Orders   PTH, Intact and Calcium   PTH-Related Peptide   Comprehensive metabolic panel    Serum calcium elevated       Relevant Orders   PTH, Intact and Calcium   PTH-Related Peptide   Comprehensive metabolic panel      Screening labs ordered.  Patient is also due for PTH testing.  All labs were ordered at the same time with plans to forward results to her endocrinologist. Continue healthy diet and regular activity.  Continue follow-up with specialist as planned.  Recommend she discuss need for colonoscopy with her gastroenterologist. Meds ordered this encounter  Medications   ALPRAZolam (XANAX) 0.25 MG tablet  Sig: Take one tab po at bedtime prn    Dispense:  30 tablet    Refill:  0    Order Specific Question:   Supervising Provider    Answer:   Lilyan Punt A [[id number]]   Continue Xanax on a rare basis as needed for sleep. Vaccines are up-to-date.  Recommend flu vaccine this fall. Return in about 1 year (around 10/28/2023) for physical.

## 2022-10-29 ENCOUNTER — Encounter: Payer: Self-pay | Admitting: Nurse Practitioner

## 2022-10-30 ENCOUNTER — Encounter: Payer: Self-pay | Admitting: Nurse Practitioner

## 2022-10-30 MED ORDER — ALPRAZOLAM 0.25 MG PO TABS
ORAL_TABLET | ORAL | 0 refills | Status: DC
Start: 2022-10-30 — End: 2023-11-07

## 2022-11-02 DIAGNOSIS — Z0000 Encounter for general adult medical examination without abnormal findings: Secondary | ICD-10-CM | POA: Diagnosis not present

## 2022-11-08 ENCOUNTER — Encounter: Payer: Self-pay | Admitting: Nurse Practitioner

## 2022-11-08 ENCOUNTER — Encounter: Payer: Self-pay | Admitting: "Endocrinology

## 2022-11-08 ENCOUNTER — Ambulatory Visit: Payer: PPO | Admitting: "Endocrinology

## 2022-11-08 ENCOUNTER — Telehealth: Payer: Self-pay | Admitting: "Endocrinology

## 2022-11-08 VITALS — BP 124/68 | HR 72 | Ht 62.0 in | Wt 130.0 lb

## 2022-11-08 DIAGNOSIS — M810 Age-related osteoporosis without current pathological fracture: Secondary | ICD-10-CM | POA: Diagnosis not present

## 2022-11-08 DIAGNOSIS — E210 Primary hyperparathyroidism: Secondary | ICD-10-CM | POA: Diagnosis not present

## 2022-11-08 MED ORDER — CINACALCET HCL 30 MG PO TABS
30.0000 mg | ORAL_TABLET | Freq: Every day | ORAL | 1 refills | Status: DC
Start: 2022-11-08 — End: 2023-01-18

## 2022-11-08 NOTE — Progress Notes (Signed)
11/08/2022, 3:52 PM  Endocrinology follow-up note  [name] [name] is a 82 y.o.-year-old female, referred by her  Tommie Sams, DO  , for evaluation for hypercalcemia/hyperparathyroidism.   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   Manometry 12/2015   Arthritis    Brady-tachy syndrome (HCC)    Chest pain 10/16/2013   normal coronary arteries on cath and normal EF   Colitis, ischemic (HCC)    Colon polyp 07/29/1993   with focal adenomatous changes   Diverticulitis    Diverticulosis of colon    Dyspepsia    Dyssynergic defecation 12/31/2015   Anorectal mano 12/2015, also has rectal hypersensitivity   Fatty liver    GERD (gastroesophageal reflux disease)    Heart murmur    HLD (hyperlipidemia)    Hypertension    IBS (irritable bowel syndrome)    Incontinence, feces    MVP (mitral valve prolapse)    nhl dx'd 07/1999/ 06/2018   chemo comp 2001; rituxin comp 2005   Osteopenia    Palpitation    Rectocele    Vasovagal syncope     Past Surgical History:  Procedure Laterality Date   ABDOMINAL HYSTERECTOMY     ABDOMINAL SURGERY  2001    for non hodgkins lymphoma; lymphoma in mesentary   ANAL RECTAL MANOMETRY N/A 12/24/2015   Procedure: ANO RECTAL MANOMETRY;  Surgeon: Iva Boop, MD;  Location: [facility];  Service: Endoscopy;  Laterality: N/A;   BLADDER SUSPENSION     BUNIONECTOMY     CHOLECYSTECTOMY  2003   COLONOSCOPY  7/200/, 11/2007   diverticulosis   FINE NEEDLE ASPIRATION BIOPSY Left 06/16/2018   left mesentery tissue   LEFT HEART CATHETERIZATION WITH CORONARY ANGIOGRAM N/A 10/16/2013   Procedure: LEFT HEART CATHETERIZATION WITH CORONARY ANGIOGRAM;  Surgeon: Peter M Swaziland, MD;  Location: [facility] CATH LAB;  Service: Cardiovascular;  Laterality: N/A;   pelvic prolapse repair     UPPER GASTROINTESTINAL ENDOSCOPY  10/2005   hiatal hernia    Social History   Tobacco Use    Smoking status: Former    Packs/day: .33    Types: Cigarettes   Smokeless tobacco: Never   Tobacco comments:    quit 1976  Vaping Use   Vaping Use: Never used  Substance Use Topics   Alcohol use: Not Currently    Comment: very rare   Drug use: No    Family History  Problem Relation Age of Onset   Thyroid disease Mother    Osteoporosis Mother    Colon cancer Father 23   Prostate cancer Brother    Colon polyps Brother    Colon polyps Brother    Stomach cancer Maternal Uncle    Stomach cancer Maternal Grandfather    Esophageal cancer Neg Hx    Pancreatic cancer Neg Hx    Liver disease Neg Hx    Breast cancer Neg Hx     Outpatient Encounter Medications as of 11/08/2022  Medication Sig   cinacalcet (SENSIPAR) 30 MG tablet Take 1 tablet (30 mg total) by mouth daily with breakfast.   ALPRAZolam (XANAX) 0.25 MG  tablet Take one tab po at bedtime prn   atenolol (TENORMIN) 25 MG tablet TAKE 1.5 TABLETS (37.5MG  TOTAL) BY MOUTHDAILY.   fexofenadine (ALLEGRA) 180 MG tablet Take 180 mg by mouth daily as needed for allergies.   pantoprazole (PROTONIX) 40 MG tablet TAKE 1 TABLET BY MOUTH ONCE DAILY BEFORE BREAKFAST   Polyvinyl Alcohol-Povidone (REFRESH OP) Apply 1 drop to eye 3 (three) times daily.   predniSONE (STERAPRED UNI-PAK 21 TAB) 5 MG (21) TBPK tablet Take 5 mg by mouth as directed.   Probiotic Product (ALIGN PO) Take 1 capsule by mouth daily. Alternates between [employer] [facility] Capsules per PCP orders every few months   rosuvastatin (CRESTOR) 5 MG tablet Take 1 tablet by mouth on Mon, Wed and Fri only.   TURMERIC PO Take 1 capsule by mouth daily.    UNABLE TO FIND Apply topically. Topical ointment for legs   Wheat Dextrin (BENEFIBER) POWD Take 1 Dose by mouth daily.   [DISCONTINUED] atenolol (TENORMIN) 25 MG tablet TAKE 1.5 TABLETS (37.5MG  TOTAL) BY MOUTHDAILY.   [DISCONTINUED] pantoprazole (PROTONIX) 40 MG tablet Take 1 tablet (40 mg total) by mouth daily before breakfast.    [DISCONTINUED] rosuvastatin (CRESTOR) 5 MG tablet Mon, Wed and Fri only.   No facility-administered encounter medications on file as of 11/08/2022.    Allergies  Allergen Reactions   Other Anaphylaxis    Other reaction(s): Facial swelling And face swelling And face swelling    Sulfa Antibiotics Swelling and Anaphylaxis    And face swelling Face swelling    Sulfonamide Derivatives Swelling    Face swelling    Glycopyrrolate Other (See Comments)    Unable to urinate Could not urinate after it was given   Latex Other (See Comments)    Blisters   Methscopolamine Other (See Comments)    Unable to urinate Unable to urinate   Ace Inhibitors Hives   Codeine Nausea Only   Iodine Hives   Iohexol Hives     Desc: 50 mg benadryl prior to exam      HPI  NIHAL GALLOZA was diagnosed with hypercalcemia more than 5 years ago.  Patient has been on expectant management for mild primary hyperparathyroidism.  She returns to his previsit labs showing worsening hypercalcemia, and increasing PTH.    No previously known history of  pituitary, adrenal dysfunctions; no family history of such dysfunctions.   Prior to her last visit, her 24-hour urine calcium is 174. She has osteoporosis on ongoing treatment with Reclast every year for 6 years, waiting for her surveillance bone density via her primary care doctor.  No prior history of fragility fractures or falls.  She reports loss of 2 inches of height.  No history of  kidney stones.  Does not have kidney problems.  she is not on HCTZ or other thiazide therapy.  Her vitamin D status is not known.  She is not on vitamin D supplement.  she is not on calcium supplements,  she eats dairy and green, leafy, vegetables on average amounts.  she does not have a family history of hypercalcemia, pituitary tumors, thyroid cancer, or osteoporosis.  Her other medical problems with hyperlipidemia on treatment lymphoma response to treatment with  surgery.   ROS:  Constitutional: Minimally fluctuating body weight,  + fatigue, no subjective hyperthermia, no subjective hypothermia   PE: BP 124/68   Pulse 72   Ht 5\' 2"  (1.575 m)   Wt 130 lb (59 kg)   BMI 23.78 kg/m ,  Body mass index is 23.78 kg/m. Wt Readings from Last 3 Encounters:  11/08/22 130 lb (59 kg)  10/28/22 130 lb 3.2 oz (59.1 kg)  08/24/22 130 lb (59 kg)       CMP ( most recent) CMP    Diabetic Labs (most recent): Lab Results  Component Value Date   HGBA1C 5.3 10/05/2019       Lab Results  Component Value Date   TSH 3.110 06/19/2019   TSH 3.010 11/21/2018   TSH 2.690 03/07/2015   TSH 2.85 08/29/2008    Recent Results (from the past 2160 hour(s))  POCT URINALYSIS DIP (CLINITEK)     Status: None   Collection Time: 10/28/22  2:10 PM  Result Value Ref Range   Color, UA yellow yellow   Clarity, UA clear clear   Glucose, UA negative negative mg/dL   Bilirubin, UA negative negative   Ketones, POC UA negative negative mg/dL   Spec Grav, UA 4.098 1.191 - 1.025   Blood, UA negative negative   pH, UA 6.0 5.0 - 8.0   POC PROTEIN,UA negative negative, trace   Urobilinogen, UA 0.2 0.2 or 1.0 E.U./dL   Nitrite, UA Negative Negative   Leukocytes, UA Negative Negative  Lipid panel     Status: Abnormal   Collection Time: 11/02/22  8:13 AM  Result Value Ref Range   Cholesterol, Total 174 100 - 199 mg/dL   Triglycerides 478 (H) 0 - 149 mg/dL   HDL 59 >29 mg/dL   VLDL Cholesterol Cal 28 5 - 40 mg/dL   LDL Chol Calc (NIH) 87 0 - 99 mg/dL   Chol/HDL Ratio 2.9 0.0 - 4.4 ratio    Comment:                                   T. Chol/HDL Ratio                                             Men  Women                               1/2 Avg.Risk  3.4    3.3                                   Avg.Risk  5.0    4.4                                2X Avg.Risk  9.6    7.1                                3X Avg.Risk 23.4   11.0   Comprehensive metabolic panel     Status:  Abnormal   Collection Time: 11/02/22  8:13 AM  Result Value Ref Range   Glucose 95 70 - 99 mg/dL   BUN 22 8 - 27 mg/dL   Creatinine, Ser 5.62 (H) 0.57 - 1.00 mg/dL   eGFR 55 (L) >13 YQ/MVH/8.46   BUN/Creatinine Ratio 22 12 - 28   Sodium 143  134 - 144 mmol/L   Potassium 5.2 3.5 - 5.2 mmol/L   Chloride 104 96 - 106 mmol/L   CO2 25 20 - 29 mmol/L   Calcium 11.1 (H) 8.7 - 10.3 mg/dL   Total Protein 6.3 6.0 - 8.5 g/dL   Albumin 4.5 3.7 - 4.7 g/dL   Globulin, Total 1.8 1.5 - 4.5 g/dL   Bilirubin Total 0.3 0.0 - 1.2 mg/dL   Alkaline Phosphatase 59 44 - 121 IU/L   AST 26 0 - 40 IU/L   ALT 25 0 - 32 IU/L  CBC with Differential     Status: Abnormal   Collection Time: 11/02/22  8:13 AM  Result Value Ref Range   WBC 9.0 3.4 - 10.8 x10E3/uL   RBC 4.62 3.77 - 5.28 x10E6/uL   Hemoglobin 14.1 11.1 - 15.9 g/dL   Hematocrit 16.1 09.6 - 46.6 %   MCV 92 79 - 97 fL   MCH 30.5 26.6 - 33.0 pg   MCHC 33.0 31.5 - 35.7 g/dL   RDW 04.5 40.9 - 81.1 %   Platelets 301 150 - 450 x10E3/uL   Neutrophils 55 Not Estab. %   Lymphs 27 Not Estab. %   Monocytes 14 Not Estab. %   Eos 3 Not Estab. %   Basos 1 Not Estab. %   Neutrophils Absolute 4.9 1.4 - 7.0 x10E3/uL   Lymphocytes Absolute 2.5 0.7 - 3.1 x10E3/uL   Monocytes Absolute 1.2 (H) 0.1 - 0.9 x10E3/uL   EOS (ABSOLUTE) 0.3 0.0 - 0.4 x10E3/uL   Basophils Absolute 0.1 0.0 - 0.2 x10E3/uL   Immature Granulocytes 0 Not Estab. %   Immature Grans (Abs) 0.0 0.0 - 0.1 x10E3/uL  PTH, Intact and Calcium     Status: Abnormal   Collection Time: 11/02/22  8:13 AM  Result Value Ref Range   PTH 133 (H) 15 - 65 pg/mL   PTH Interp Comment     Comment: Interpretation                 Intact PTH    Calcium                                 (pg/mL)      (mg/dL) Normal                          15 - 65     8.6 - 10.2 Primary Hyperparathyroidism         >65          >10.2 Secondary Hyperparathyroidism       >65          <10.2 Non-Parathyroid Hypercalcemia       <65           >10.2 Hypoparathyroidism                  <15          < 8.6 Non-Parathyroid Hypocalcemia    15 - 65          < 8.6   PTH-Related Peptide     Status: None (Preliminary result)   Collection Time: 11/02/22  8:13 AM  Result Value Ref Range   PTH-related peptide WILL FOLLOW      Assessment: 1. Hypercalcemia / Hyperparathyroidism 2.  Osteoporosis   Plan: Patient has had several instances of elevated calcium, with the highest level  being at 11.1 mg/dL.  Her most recent labs show calcium stable at 11.1 with the associated PTH of 137.   Apparent complications seems to be her osteoporosis for which she is on management with Reclast.  -  no history of  nephrolithiasis,  fragility fractures. No abdominal pain, no major mood disorders, no bone pain. -In light of her persistent, and modestly worsening parathyroid profile, she is offered options of treatment.  she declines surgery as an option. She is offered low-dose Sensipar which she agrees to.  I discussed and prescribed Sensipar 30 mg p.o. daily at breakfast with plan to repeat her labs before her next visit in 6 months.  Regarding her osteoporosis, she is under the care of Dr. Alben Deeds. Her most recent bone density from August 2022 showed -2.8 T score on dual femur which was unchanged from her 2020 study.  A T score of -2.4 on left forearm radius .  Lumbar spine was excluded due to advanced degenerative changes.  Her next bone density is due in August 2024. She is advised to continue Reclast until her next bone density.  Afterwards, she will be either on drug holidays or switch to Prolia depending on the status of her bone density.    Her next appointment must include distal third of radius.  She is advised to maintain close follow-up with her PMD.  I spent  21  minutes in the care of the patient today including review of labs from Thyroid Function, CMP, and other relevant labs ; imaging/biopsy records (current and previous including  abstractions from other facilities); face-to-face time discussing  her lab results and symptoms, medications doses, her options of short and long term treatment based on the latest standards of care / guidelines;   and documenting the encounter.  Darrick Grinder  participated in the discussions, expressed understanding, and voiced agreement with the above plans.  All questions were answered to her satisfaction. she is encouraged to contact clinic should she have any questions or concerns prior to her return visit.   - Return in about 3 months (around 02/08/2023) for F/U with Pre-visit Labs.   Marquis Lunch, MD [facility] [address] [place], [place] [address] Phone: [phone]  Fax: [phone]    This note was partially dictated with voice recognition software. Similar sounding words can be transcribed inadequately or may not  be corrected upon review.  11/08/2022, 3:52 PM

## 2022-11-08 NOTE — Telephone Encounter (Signed)
Pt states copay for cinacalcet was $142.  She was curious if there was something else that could be sent in.

## 2022-11-08 NOTE — Telephone Encounter (Signed)
Can someone call and relay this to the pt

## 2022-11-09 NOTE — Telephone Encounter (Signed)
Discussed with pt, understanding voiced. 

## 2022-11-12 LAB — COMPREHENSIVE METABOLIC PANEL
ALT: 25 IU/L (ref 0–32)
AST: 26 IU/L (ref 0–40)
Albumin: 4.5 g/dL (ref 3.7–4.7)
Alkaline Phosphatase: 59 IU/L (ref 44–121)
BUN/Creatinine Ratio: 22 (ref 12–28)
BUN: 22 mg/dL (ref 8–27)
Bilirubin Total: 0.3 mg/dL (ref 0.0–1.2)
CO2: 25 mmol/L (ref 20–29)
Calcium: 11.1 mg/dL — ABNORMAL HIGH (ref 8.7–10.3)
Chloride: 104 mmol/L (ref 96–106)
Creatinine, Ser: 1.02 mg/dL — ABNORMAL HIGH (ref 0.57–1.00)
Globulin, Total: 1.8 g/dL (ref 1.5–4.5)
Glucose: 95 mg/dL (ref 70–99)
Potassium: 5.2 mmol/L (ref 3.5–5.2)
Sodium: 143 mmol/L (ref 134–144)
Total Protein: 6.3 g/dL (ref 6.0–8.5)
eGFR: 55 mL/min/{1.73_m2} — ABNORMAL LOW (ref >59)

## 2022-11-12 LAB — LIPID PANEL
Chol/HDL Ratio: 2.9 ratio (ref 0.0–4.4)
Cholesterol, Total: 174 mg/dL (ref 100–199)
HDL: 59 mg/dL (ref >39)
LDL Chol Calc (NIH): 87 mg/dL (ref 0–99)
Triglycerides: 167 mg/dL — ABNORMAL HIGH (ref 0–149)
VLDL Cholesterol Cal: 28 mg/dL (ref 5–40)

## 2022-11-12 LAB — CBC WITH DIFFERENTIAL/PLATELET
Basophils Absolute: 0.1 10*3/uL (ref 0.0–0.2)
Basos: 1 %
EOS (ABSOLUTE): 0.3 10*3/uL (ref 0.0–0.4)
Eos: 3 %
Hematocrit: 42.7 % (ref 34.0–46.6)
Hemoglobin: 14.1 g/dL (ref 11.1–15.9)
Immature Grans (Abs): 0 10*3/uL (ref 0.0–0.1)
Immature Granulocytes: 0 %
Lymphocytes Absolute: 2.5 10*3/uL (ref 0.7–3.1)
Lymphs: 27 %
MCH: 30.5 pg (ref 26.6–33.0)
MCHC: 33 g/dL (ref 31.5–35.7)
MCV: 92 fL (ref 79–97)
Monocytes Absolute: 1.2 10*3/uL — ABNORMAL HIGH (ref 0.1–0.9)
Monocytes: 14 %
Neutrophils Absolute: 4.9 10*3/uL (ref 1.4–7.0)
Neutrophils: 55 %
Platelets: 301 10*3/uL (ref 150–450)
RBC: 4.62 x10E6/uL (ref 3.77–5.28)
RDW: 12.9 % (ref 11.7–15.4)
WBC: 9 10*3/uL (ref 3.4–10.8)

## 2022-11-12 LAB — PTH-RELATED PEPTIDE: PTH-related peptide: <2 pmol/L

## 2022-11-12 LAB — PTH, INTACT AND CALCIUM: PTH: 133 pg/mL — ABNORMAL HIGH (ref 15–65)

## 2022-11-15 ENCOUNTER — Telehealth: Payer: Self-pay

## 2022-11-15 NOTE — Telephone Encounter (Signed)
Pt called stating she started taking her Cinacalcet 30mg  Thurday and since then she has noticed her BP has dropped. States her BP has ranged from 93/47 to at the highest 121/60 late in the day. States she also takes atenolol 25mg  each morning and 12.5mg  each evening. Pt states she did not take her Cinacalcet yesterday. Her BP this morning was 120/63 at 6:30am and after taking her atenolol and a 2 mile walk her BP was 105/53. States she has not taken the Cinacalcet today. Please advise.

## 2022-11-15 NOTE — Telephone Encounter (Signed)
Discussed with pt, advised her to decrease her atenolol to 12.5mg  with breakfast only and continue taking her cinacalcet and check her BP 2-3 times daily per Dr.Nida's orders. Understanding voiced.

## 2022-12-01 ENCOUNTER — Other Ambulatory Visit (HOSPITAL_COMMUNITY): Payer: Self-pay | Admitting: Internal Medicine

## 2022-12-01 DIAGNOSIS — H2513 Age-related nuclear cataract, bilateral: Secondary | ICD-10-CM | POA: Diagnosis not present

## 2022-12-01 DIAGNOSIS — H469 Unspecified optic neuritis: Secondary | ICD-10-CM | POA: Diagnosis not present

## 2022-12-01 DIAGNOSIS — H268 Other specified cataract: Secondary | ICD-10-CM | POA: Diagnosis not present

## 2022-12-01 DIAGNOSIS — H35033 Hypertensive retinopathy, bilateral: Secondary | ICD-10-CM | POA: Diagnosis not present

## 2022-12-01 DIAGNOSIS — H35342 Macular cyst, hole, or pseudohole, left eye: Secondary | ICD-10-CM | POA: Diagnosis not present

## 2022-12-01 DIAGNOSIS — H43813 Vitreous degeneration, bilateral: Secondary | ICD-10-CM | POA: Diagnosis not present

## 2022-12-01 DIAGNOSIS — M810 Age-related osteoporosis without current pathological fracture: Secondary | ICD-10-CM

## 2022-12-01 DIAGNOSIS — G245 Blepharospasm: Secondary | ICD-10-CM | POA: Diagnosis not present

## 2022-12-13 ENCOUNTER — Ambulatory Visit (HOSPITAL_COMMUNITY)
Admission: RE | Admit: 2022-12-13 | Discharge: 2022-12-13 | Disposition: A | Discharge status: Home / Self Care | Payer: PPO | Source: Ambulatory Visit | Attending: Internal Medicine | Admitting: Internal Medicine

## 2022-12-13 DIAGNOSIS — M810 Age-related osteoporosis without current pathological fracture: Secondary | ICD-10-CM | POA: Diagnosis not present

## 2022-12-29 DIAGNOSIS — H268 Other specified cataract: Secondary | ICD-10-CM | POA: Diagnosis not present

## 2022-12-29 DIAGNOSIS — G43B0 Ophthalmoplegic migraine, not intractable: Secondary | ICD-10-CM | POA: Diagnosis not present

## 2022-12-30 ENCOUNTER — Other Ambulatory Visit: Payer: Self-pay | Admitting: Internal Medicine

## 2023-01-05 ENCOUNTER — Telehealth: Payer: Self-pay | Admitting: Cardiovascular Disease

## 2023-01-05 DIAGNOSIS — H268 Other specified cataract: Secondary | ICD-10-CM | POA: Diagnosis not present

## 2023-01-05 DIAGNOSIS — H469 Unspecified optic neuritis: Secondary | ICD-10-CM | POA: Diagnosis not present

## 2023-01-05 DIAGNOSIS — H35342 Macular cyst, hole, or pseudohole, left eye: Secondary | ICD-10-CM | POA: Diagnosis not present

## 2023-01-05 DIAGNOSIS — H2513 Age-related nuclear cataract, bilateral: Secondary | ICD-10-CM | POA: Diagnosis not present

## 2023-01-05 DIAGNOSIS — G245 Blepharospasm: Secondary | ICD-10-CM | POA: Diagnosis not present

## 2023-01-05 DIAGNOSIS — H35033 Hypertensive retinopathy, bilateral: Secondary | ICD-10-CM | POA: Diagnosis not present

## 2023-01-05 DIAGNOSIS — H43813 Vitreous degeneration, bilateral: Secondary | ICD-10-CM | POA: Diagnosis not present

## 2023-01-05 MED ORDER — ATENOLOL 25 MG PO TABS: ORAL_TABLET | ORAL | 0 refills | Status: DC

## 2023-01-05 NOTE — Telephone Encounter (Signed)
Pt's medication was sent to pt's pharmacy as requested. Confirmation received.  °

## 2023-01-05 NOTE — Telephone Encounter (Signed)
*  STAT* If patient is at the pharmacy, call can be transferred to refill team.   1. Which medications need to be refilled? (please list name of each medication and dose if known) atenolol (TENORMIN) 25 MG tablet   2. Which pharmacy/location (including street and city if local pharmacy) is medication to be sent to?  [id number] - [place], [place] - [address]    3. Do they need a 30 day or 90 day supply? 90

## 2023-01-11 DIAGNOSIS — M79674 Pain in right toe(s): Secondary | ICD-10-CM | POA: Diagnosis not present

## 2023-01-11 DIAGNOSIS — Z6823 Body mass index (BMI) 23.0-23.9, adult: Secondary | ICD-10-CM | POA: Diagnosis not present

## 2023-01-11 DIAGNOSIS — M1991 Primary osteoarthritis, unspecified site: Secondary | ICD-10-CM | POA: Diagnosis not present

## 2023-01-11 DIAGNOSIS — M810 Age-related osteoporosis without current pathological fracture: Secondary | ICD-10-CM | POA: Diagnosis not present

## 2023-01-11 DIAGNOSIS — M79642 Pain in left hand: Secondary | ICD-10-CM | POA: Diagnosis not present

## 2023-01-11 DIAGNOSIS — M5136 Other intervertebral disc degeneration, lumbar region: Secondary | ICD-10-CM | POA: Diagnosis not present

## 2023-01-11 DIAGNOSIS — E559 Vitamin D deficiency, unspecified: Secondary | ICD-10-CM | POA: Diagnosis not present

## 2023-01-18 ENCOUNTER — Telehealth: Payer: Self-pay | Admitting: Internal Medicine

## 2023-01-18 ENCOUNTER — Ambulatory Visit (INDEPENDENT_AMBULATORY_CARE_PROVIDER_SITE_OTHER): Payer: PPO | Admitting: Family Medicine

## 2023-01-18 ENCOUNTER — Ambulatory Visit (HOSPITAL_COMMUNITY)
Admission: RE | Admit: 2023-01-18 | Discharge: 2023-01-18 | Disposition: A | Discharge status: Home / Self Care | Payer: PPO | Source: Ambulatory Visit | Attending: Family Medicine | Admitting: Family Medicine

## 2023-01-18 VITALS — BP 137/80 | HR 68 | Temp 98.1°F | Ht 62.0 in | Wt 132.2 lb

## 2023-01-18 DIAGNOSIS — R5383 Other fatigue: Secondary | ICD-10-CM | POA: Insufficient documentation

## 2023-01-18 DIAGNOSIS — M549 Dorsalgia, unspecified: Secondary | ICD-10-CM | POA: Diagnosis not present

## 2023-01-18 DIAGNOSIS — M546 Pain in thoracic spine: Secondary | ICD-10-CM | POA: Diagnosis not present

## 2023-01-18 DIAGNOSIS — E782 Mixed hyperlipidemia: Secondary | ICD-10-CM | POA: Diagnosis not present

## 2023-01-18 DIAGNOSIS — E210 Primary hyperparathyroidism: Secondary | ICD-10-CM

## 2023-01-18 NOTE — Telephone Encounter (Signed)
Pt stated that she has been having off and on loose stools that are hard to control sometimes that has been going on for several months now. Pt stated that there is no set pattern and some days she is good and will have a BM in the AM and be good the rest of the day is good  and other days she will go 5 or 6 times in the AM and have to sit on the toilet for 20-30 minutes at times. Pt stated that she does have abdominal spasms at times when she is having multiple BM in the AM when she is having the flares. Pt states that she walks 2 miles daily and is very active, Takes probiotic every morning and a teaspoon of Benifiber at night.  Pt was previous scheduled for an Office Visit on 04/22/2023. Pt stated that she had an appointment that she had to attend to this AM and end up having to take 1/2 imodium which did help some. Pt questioning if something can be prescribed prior to her office visit to her regulate her BM. Please Advise.

## 2023-01-18 NOTE — Telephone Encounter (Signed)
Inbound call from patient stating that she wanted to schedule an appointment with Dr. Leone Payor due to her IBS giving her issues. Patient was scheduled for 12/20 at 9:50 and is requesting recommendations in the meantime. Please advise.

## 2023-01-18 NOTE — Patient Instructions (Signed)
Labs and xray today.  We will call with results.  Follow up in 1 month.

## 2023-01-18 NOTE — Telephone Encounter (Signed)
Patient returned your call. Please advise.  

## 2023-01-18 NOTE — Assessment & Plan Note (Addendum)
The area of her discomfort is in the region of her prior herpes zoster.  Concerned that this is the culprit.  Given her history of osteoporosis, x-ray was obtained and was independent reviewed by me.  Interpretation: Mild degenerative changes.  No evidence of fracture or other acute abnormality.  Awaiting lab results.

## 2023-01-18 NOTE — Assessment & Plan Note (Signed)
Recommended surgical consultation.  Patient will consider.

## 2023-01-18 NOTE — Telephone Encounter (Signed)
She should increase the benefiber  It says 1 teaspoon she is taking so would try 1 or even 2 tablespoons a day to try to have better more complete emptying with BM  and not have multiple stools some days  If she is using artificial sweeteners she should stop them (can cause diarrhea)

## 2023-01-18 NOTE — Assessment & Plan Note (Signed)
Patient with a constellation of symptoms with fatigue.  There is no evidence of tremor on exam.  I suspect anxiety is playing a role although patient denies.  Labs for further evaluation today.

## 2023-01-18 NOTE — Progress Notes (Signed)
Subjective:  Patient ID: [name] [name], female    DOB: January 31, 1941  Age: 82 y.o. MRN: [id number]  CC: Back pain   HPI:  82 year old female with the below mentioned medical problems presents for evaluation of the above  Patient reports ongoing back pain since Thursday/Friday.  Initially started around the right scapula and now has moved to closer to the spine.  She has used Tylenol without resolution.  No recent fall, trauma, injury.  She does have known osteoporosis.  Additionally, patient reports that she has had ongoing bouts where she feels "shaky".  She reports associated fatigue and decreased energy.  She states that this is not like her.  She states that she walks 2 miles a day.  She states that she has noticed tremor in her hands particularly with certain activities.  Denies anxiety.  She is concerned that there is something worrisome going on.  Additionally, patient has hyperparathyroidism.  She is no longer taking Cinacalcet.  Will discuss referral to general surgery for consultation regarding definitive treatment of hyperparathyroidism.  Patient Active Problem List   Diagnosis Date Noted   Acute thoracic back pain 01/18/2023   Fatigue 01/18/2023   Hyperparathyroidism, primary (HCC) 12/17/2021   Age-related osteoporosis without current pathological fracture 12/17/2021   Peripheral neuropathy 08/26/2016   Dyssynergic defecation 12/31/2015   HTN (hypertension) 04/18/2015   NSVT (nonsustained ventricular tachycardia) (HCC) 11/27/2013   Allergic rhinitis 08/20/2013   Hyperlipidemia 08/27/2008   MVP (mitral valve prolapse) 08/27/2008    IBS- diarrhea predominant 10/13/2007   NHL (non-Hodgkin's lymphoma) (HCC) 06/16/2007    Social Hx   Social History   Socioeconomic History   Marital status: Widowed    Spouse name: Not on file   Number of children: 1   Years of education: Not on file   Highest education level: 12th grade  Occupational History   Occupation: Warehouse manager  work part time    Associate Professor: RETIRED  Tobacco Use   Smoking status: Former    Current packs/day: 0.33    Types: Cigarettes   Smokeless tobacco: Never   Tobacco comments:    quit 1976  Vaping Use   Vaping status: Never Used  Substance and Sexual Activity   Alcohol use: Not Currently    Comment: very rare   Drug use: No   Sexual activity: Yes    Birth control/protection: Surgical  Other Topics Concern   Not on file  Social History Narrative   Retired, widowed, 1 child   Rare EtOH, former smoker none now   No drugs   Right handed   Social Determinants of Health   Financial Resource Strain: Low Risk  (01/17/2023)   Overall Financial Resource Strain (CARDIA)    Difficulty of Paying Living Expenses: Not hard at all  Food Insecurity: No Food Insecurity (01/17/2023)   Hunger Vital Sign    Worried About Running Out of Food in the Last Year: Never true    Ran Out of Food in the Last Year: Never true  Transportation Needs: No Transportation Needs (01/17/2023)   PRAPARE - Administrator, Civil Service (Medical): No    Lack of Transportation (Non-Medical): No  Physical Activity: Sufficiently Active (01/17/2023)   Exercise Vital Sign    Days of Exercise per Week: 5 days    Minutes of Exercise per Session: 90 min  Stress: Patient Declined (01/17/2023)   [employer] of Occupational Health - Occupational Stress Questionnaire    Feeling of Stress : Patient  declined  Social Connections: Moderately Isolated (01/17/2023)   Social Connection and Isolation Panel [NHANES]    Frequency of Communication with Friends and Family: More than three times a week    Frequency of Social Gatherings with Friends and Family: Three times a week    Attends Religious Services: More than 4 times per year    Active Member of Clubs or Organizations: No    Attends Banker Meetings: Not on file    Marital Status: Widowed    Review of Systems Per HPI  Objective:  BP 137/80    Pulse 68   Temp 98.1 F (36.7 C)   Ht 5\' 2"  (1.575 m)   Wt 132 lb 3.2 oz (60 kg)   SpO2 97%   BMI 24.18 kg/m      01/18/2023   10:27 AM 11/08/2022    1:08 PM 10/28/2022    1:21 PM  BP/Weight  Systolic BP 137 124 127  Diastolic BP 80 68 80  Wt. (Lbs) 132.2 130 130.2  BMI 24.18 kg/m2 23.78 kg/m2 23.81 kg/m2    Physical Exam Constitutional:      General: She is not in acute distress.    Appearance: Normal appearance.  HENT:     Head: Normocephalic and atraumatic.  Cardiovascular:     Rate and Rhythm: Normal rate and regular rhythm.  Pulmonary:     Effort: Pulmonary effort is normal.     Breath sounds: Normal breath sounds.  Musculoskeletal:       Back:     Comments: Labeled location is the area of tenderness.  Neurological:     Mental Status: She is alert.  Psychiatric:     Comments: Anxious.     Lab Results  Component Value Date   WBC 9.0 11/02/2022   HGB 14.1 11/02/2022   HCT 42.7 11/02/2022   PLT 301 11/02/2022   GLUCOSE 95 11/02/2022   CHOL 174 11/02/2022   TRIG 167 (H) 11/02/2022   HDL 59 11/02/2022   LDLCALC 87 11/02/2022   ALT 25 11/02/2022   AST 26 11/02/2022   NA 143 11/02/2022   K 5.2 11/02/2022   CL 104 11/02/2022   CREATININE 1.02 (H) 11/02/2022   BUN 22 11/02/2022   CO2 25 11/02/2022   TSH 3.110 06/19/2019   INR 0.94 06/16/2018   HGBA1C 5.3 10/05/2019     Assessment & Plan:   Problem List Items Addressed This Visit       Endocrine   Hyperparathyroidism, primary [facility])    Recommended surgical consultation.  Patient will consider.      Relevant Orders   CMP14+EGFR   Vitamin D, 25-hydroxy   Parathyroid hormone, intact (no Ca)     Other   Hyperlipidemia   Relevant Orders   Lipid panel   Fatigue    Patient with a constellation of symptoms with fatigue.  There is no evidence of tremor on exam.  I suspect anxiety is playing a role although patient denies.  Labs for further evaluation today.      Relevant Orders   CBC with  Differential   TSH   Vitamin B12   Acute thoracic back pain - Primary    The area of her discomfort is in the region of her prior herpes zoster.  Concerned that this is the culprit.  Given her history of osteoporosis, x-ray was obtained and was independent reviewed by me.  Interpretation: Mild degenerative changes.  No evidence of fracture or other acute  abnormality.  Awaiting lab results.      Relevant Orders   DG Thoracic Spine W/Swimmers (Completed)    Follow-up:  Return in about 1 month (around 02/17/2023).  Everlene Other DO [facility]

## 2023-01-19 NOTE — Telephone Encounter (Signed)
Pt made aware of Dr. Gessner recommendations: Pt verbalized understanding with all questions answered.   

## 2023-01-19 NOTE — Telephone Encounter (Signed)
Left message for pt to call back  °

## 2023-01-24 DIAGNOSIS — M79671 Pain in right foot: Secondary | ICD-10-CM | POA: Diagnosis not present

## 2023-01-25 ENCOUNTER — Ambulatory Visit: Payer: PPO | Admitting: Cardiology

## 2023-01-28 ENCOUNTER — Ambulatory Visit: Payer: PPO | Admitting: Nurse Practitioner

## 2023-01-31 ENCOUNTER — Encounter: Payer: Self-pay | Admitting: Family Medicine

## 2023-01-31 ENCOUNTER — Ambulatory Visit (INDEPENDENT_AMBULATORY_CARE_PROVIDER_SITE_OTHER): Payer: PPO | Admitting: Family Medicine

## 2023-01-31 VITALS — BP 142/66 | HR 65 | Temp 97.2°F | Ht 62.0 in | Wt 129.0 lb

## 2023-01-31 DIAGNOSIS — E210 Primary hyperparathyroidism: Secondary | ICD-10-CM | POA: Diagnosis not present

## 2023-01-31 DIAGNOSIS — H8110 Benign paroxysmal vertigo, unspecified ear: Secondary | ICD-10-CM | POA: Diagnosis not present

## 2023-01-31 DIAGNOSIS — M546 Pain in thoracic spine: Secondary | ICD-10-CM | POA: Diagnosis not present

## 2023-01-31 MED ORDER — MECLIZINE HCL 25 MG PO TABS: 25.0000 mg | ORAL_TABLET | Freq: Three times a day (TID) | ORAL | 0 refills | Status: DC | PRN

## 2023-01-31 NOTE — Assessment & Plan Note (Signed)
Back pain is improved.  I suspect that there is a component of postherpetic neuralgia.  Will continue to monitor closely.

## 2023-01-31 NOTE — Assessment & Plan Note (Signed)
Meclizine as needed.  

## 2023-01-31 NOTE — Patient Instructions (Signed)
Medication as prescribed.  Referral placed - Dr. Darnell Level.  Take care  Dr. Adriana Simas

## 2023-01-31 NOTE — Progress Notes (Signed)
Subjective:  Patient ID: [name] [name], female    DOB: 1941/04/03  Age: 82 y.o. MRN: [id number]  CC: Chief Complaint  Patient presents with   Dizziness    Usually when laying down - inner ear  Nasal drainage and dry cough    pain follow up     Upper right back pain, taking prednisone 10 mg for foot pain    HPI:  82 year old female presents for evaluation of the above.  Patient continues to have thoracic back pain.  X-ray of the thoracic spine was negative.  She states that it is currently improved after she was placed on prednisone for an orthopedic issue.  Patient reports recent development of postnasal drip and cough as of Friday.  She believes that this is secondary to allergies and weather change.  Patient reports that she has had some intermittent dizziness.  Occurs with abrupt movements of the head and with lying down.  Suspected vertigo.  She would like treatment for this.  Additionally, patient is now interested in referral to general surgery regarding hyperparathyroidism.  We previously discussed this.  Patient Active Problem List   Diagnosis Date Noted   BPPV (benign paroxysmal positional vertigo) 01/31/2023   Acute thoracic back pain 01/18/2023   Fatigue 01/18/2023   Hyperparathyroidism, primary (HCC) 12/17/2021   Age-related osteoporosis without current pathological fracture 12/17/2021   Peripheral neuropathy 08/26/2016   Dyssynergic defecation 12/31/2015   HTN (hypertension) 04/18/2015   NSVT (nonsustained ventricular tachycardia) (HCC) 11/27/2013   Allergic rhinitis 08/20/2013   Hyperlipidemia 08/27/2008   MVP (mitral valve prolapse) 08/27/2008    IBS- diarrhea predominant 10/13/2007   NHL (non-Hodgkin's lymphoma) (HCC) 06/16/2007    Social Hx   Social History   Socioeconomic History   Marital status: Widowed    Spouse name: Not on file   Number of children: 1   Years of education: Not on file   Highest education level: 12th grade  Occupational  History   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  Tobacco Use   Smoking status: Former    Current packs/day: 0.33    Types: Cigarettes   Smokeless tobacco: Never   Tobacco comments:    quit 1976  Vaping Use   Vaping status: Never Used  Substance and Sexual Activity   Alcohol use: Not Currently    Comment: very rare   Drug use: No   Sexual activity: Yes    Birth control/protection: Surgical  Other Topics Concern   Not on file  Social History Narrative   Retired, widowed, 1 child   Rare EtOH, former smoker none now   No drugs   Right handed   Social Determinants of Health   Financial Resource Strain: Low Risk  (01/17/2023)   Overall Financial Resource Strain (CARDIA)    Difficulty of Paying Living Expenses: Not hard at all  Food Insecurity: No Food Insecurity (01/17/2023)   Hunger Vital Sign    Worried About Running Out of Food in the Last Year: Never true    Ran Out of Food in the Last Year: Never true  Transportation Needs: No Transportation Needs (01/17/2023)   PRAPARE - Administrator, Civil Service (Medical): No    Lack of Transportation (Non-Medical): No  Physical Activity: Sufficiently Active (01/17/2023)   Exercise Vital Sign    Days of Exercise per Week: 5 days    Minutes of Exercise per Session: 90 min  Stress: Patient Declined (01/17/2023)  [employer] of Occupational Health - Occupational Stress Questionnaire    Feeling of Stress : Patient declined  Social Connections: Moderately Isolated (01/17/2023)   Social Connection and Isolation Panel [NHANES]    Frequency of Communication with Friends and Family: More than three times a week    Frequency of Social Gatherings with Friends and Family: Three times a week    Attends Religious Services: More than 4 times per year    Active Member of Clubs or Organizations: No    Attends Banker Meetings: Not on file    Marital Status: Widowed    Review of Systems Per  HPI  Objective:  BP (!) 142/66   Pulse 65   Temp (!) 97.2 F (36.2 C)   Ht 5\' 2"  (1.575 m)   Wt 129 lb (58.5 kg)   BMI 23.59 kg/m      01/31/2023   10:17 AM 01/31/2023    9:55 AM 01/18/2023   10:27 AM  BP/Weight  Systolic BP 142 147 137  Diastolic BP 66 64 80  Wt. (Lbs)  129 132.2  BMI  23.59 kg/m2 24.18 kg/m2    Physical Exam Vitals and nursing note reviewed.  Constitutional:      General: She is not in acute distress.    Appearance: Normal appearance.  HENT:     Head: Normocephalic and atraumatic.  Cardiovascular:     Rate and Rhythm: Normal rate and regular rhythm.  Pulmonary:     Effort: Pulmonary effort is normal.     Breath sounds: Normal breath sounds.  Neurological:     Mental Status: She is alert.     Lab Results  Component Value Date   WBC 9.7 01/18/2023   HGB 13.3 01/18/2023   HCT 40.1 01/18/2023   PLT 312 01/18/2023   GLUCOSE 93 01/18/2023   CHOL 178 01/18/2023   TRIG 179 (H) 01/18/2023   HDL 51 01/18/2023   LDLCALC 96 01/18/2023   ALT 27 01/18/2023   AST 34 01/18/2023   NA 138 01/18/2023   K 4.4 01/18/2023   CL 101 01/18/2023   CREATININE 0.88 01/18/2023   BUN 19 01/18/2023   CO2 23 01/18/2023   TSH 3.330 01/18/2023   INR 0.94 06/16/2018   HGBA1C 5.3 10/05/2019     Assessment & Plan:   Problem List Items Addressed This Visit       Endocrine   Hyperparathyroidism, primary [facility])    Referral placed for surgical consultation.      Relevant Orders   Ambulatory referral to General Surgery     Nervous and Auditory   BPPV (benign paroxysmal positional vertigo)    Meclizine as needed.        Other   Acute thoracic back pain - Primary    Back pain is improved.  I suspect that there is a component of postherpetic neuralgia.  Will continue to monitor closely.       Meds ordered this encounter  Medications   meclizine (ANTIVERT) 25 MG tablet    Sig: Take 1 tablet (25 mg total) by mouth 3 (three) times daily as needed for  dizziness.    Dispense:  30 tablet    Refill:  0    Gilberto Streck DO [facility]

## 2023-01-31 NOTE — Assessment & Plan Note (Signed)
Referral placed for surgical consultation.

## 2023-02-02 DIAGNOSIS — G245 Blepharospasm: Secondary | ICD-10-CM | POA: Diagnosis not present

## 2023-02-02 DIAGNOSIS — H0279 Other degenerative disorders of eyelid and periocular area: Secondary | ICD-10-CM | POA: Diagnosis not present

## 2023-02-03 ENCOUNTER — Encounter: Payer: Self-pay | Admitting: Cardiovascular Disease

## 2023-02-03 NOTE — Progress Notes (Signed)
Cardiology Office Note:  .   Date:  02/04/2023  ID:  Darrick Grinder, DOB 03/07/1941, MRN [id number] PCP: Tommie Sams, DO  [facility] Providers Cardiologist:  Hollice Espy, Charissa Knowles Click to update primary MD,subspecialty MD or APP then REFRESH:1}   History of Present Illness: .   [name] [name] is a 82 y.o. female with hx of palpitations  She was last seen by Dr. Shari Prows   Hx of non hodgknis  lymphoma Mancel Bale, MD)  Hx of NSVT - stable on Atenolol   Has very brief episodes of palpitations at night   Walks 2 miles a day   She wants to see Dr. Anne Fu when I retire   No CP , no dyspnea .   Hs some  DOE with walking ( down to her shop to get the lawnmower )      ROS:   Studies Reviewed: .         Risk Assessment/Calculations:             Physical Exam:   VS:  BP 126/66   Pulse 77   Ht 5\' 2"  (1.575 m)   Wt 128 lb 6.4 oz (58.2 kg)   SpO2 98%   BMI 23.48 kg/m    Wt Readings from Last 3 Encounters:  02/04/23 128 lb 6.4 oz (58.2 kg)  01/31/23 129 lb (58.5 kg)  01/18/23 132 lb 3.2 oz (60 kg)    GEN: Well nourished, well developed in no acute distress NECK: No JVD; No carotid bruits CARDIAC: RRR, no murmurs, rubs, gallops RESPIRATORY:  Clear to auscultation without rales, wheezing or rhonchi  ABDOMEN: Soft, non-tender, non-distended EXTREMITIES:  No edema; No deformity   ASSESSMENT AND PLAN: .     Mild MR :  has mild DOE walking to her shop.  I've encouraged her to add some hills to her usual routine   2.  Hyperlipidemia :: lipids look great   3.  Supraventricular tachycardia:    stable ,  no recurrent episodes.   No changes in medications        Dispo: 6 months office visit  She wants to see Dr. Anne Fu upon my retirement   Signed, Kristeen Miss, MD

## 2023-02-04 ENCOUNTER — Ambulatory Visit: Payer: PPO | Attending: Cardiology | Admitting: Cardiovascular Disease

## 2023-02-04 ENCOUNTER — Encounter: Payer: Self-pay | Admitting: Cardiovascular Disease

## 2023-02-04 VITALS — BP 126/66 | HR 77 | Ht 62.0 in | Wt 128.4 lb

## 2023-02-04 DIAGNOSIS — I10 Essential (primary) hypertension: Secondary | ICD-10-CM

## 2023-02-04 DIAGNOSIS — E782 Mixed hyperlipidemia: Secondary | ICD-10-CM | POA: Diagnosis not present

## 2023-02-04 DIAGNOSIS — I4729 Other ventricular tachycardia: Secondary | ICD-10-CM

## 2023-02-04 NOTE — Patient Instructions (Signed)
Medication Instructions:  Your physician recommends that you continue on your current medications as directed. Please refer to the Current Medication list given to you today.  *If you need a refill on your cardiac medications before your next appointment, please call your pharmacy*   Lab Work: NONE If you have labs (blood work) drawn today and your tests are completely normal, you will receive your results only by: MyChart Message (if you have MyChart) OR A paper copy in the mail If you have any lab test that is abnormal or we need to change your treatment, we will call you to review the results.   Testing/Procedures: NONE   Follow-Up: At  [facility], you and your health needs are our priority.  As part of our continuing mission to provide you with exceptional heart care, we have created designated Provider Care Teams.  These Care Teams include your primary Cardiologist (physician) and Advanced Practice Providers (APPs -  Physician Assistants and Nurse Practitioners) who all work together to provide you with the care you need, when you need it.  We recommend signing up for the patient portal called "MyChart".  Sign up information is provided on this After Visit Summary.  MyChart is used to connect with patients for Virtual Visits (Telemedicine).  Patients are able to view lab/test results, encounter notes, upcoming appointments, etc.  Non-urgent messages can be sent to your provider as well.   To learn more about what you can do with MyChart, go to [email or web address].    Your next appointment:   6 month(s)  Provider:   Philip Nahser, MD      

## 2023-02-08 ENCOUNTER — Other Ambulatory Visit: Payer: Self-pay | Admitting: *Deleted

## 2023-02-08 DIAGNOSIS — C8203 Follicular lymphoma grade I, intra-abdominal lymph nodes: Secondary | ICD-10-CM

## 2023-02-14 ENCOUNTER — Inpatient Hospital Stay: Payer: PPO

## 2023-02-14 ENCOUNTER — Inpatient Hospital Stay: Payer: PPO | Attending: Oncology

## 2023-02-14 ENCOUNTER — Inpatient Hospital Stay: Payer: PPO | Admitting: Oncology

## 2023-02-14 VITALS — BP 109/67 | HR 72 | Temp 98.2°F | Resp 18 | Ht 62.0 in | Wt 129.7 lb

## 2023-02-14 DIAGNOSIS — C8203 Follicular lymphoma grade I, intra-abdominal lymph nodes: Secondary | ICD-10-CM | POA: Diagnosis not present

## 2023-02-14 DIAGNOSIS — Z860100 Personal history of colon polyps, unspecified: Secondary | ICD-10-CM | POA: Insufficient documentation

## 2023-02-14 DIAGNOSIS — R7989 Other specified abnormal findings of blood chemistry: Secondary | ICD-10-CM | POA: Insufficient documentation

## 2023-02-14 DIAGNOSIS — Z23 Encounter for immunization: Secondary | ICD-10-CM

## 2023-02-14 DIAGNOSIS — Z8572 Personal history of non-Hodgkin lymphomas: Secondary | ICD-10-CM | POA: Diagnosis not present

## 2023-02-14 DIAGNOSIS — M8580 Other specified disorders of bone density and structure, unspecified site: Secondary | ICD-10-CM | POA: Diagnosis not present

## 2023-02-14 DIAGNOSIS — K589 Irritable bowel syndrome without diarrhea: Secondary | ICD-10-CM | POA: Insufficient documentation

## 2023-02-14 LAB — CMP (CANCER CENTER ONLY)
ALT: 18 U/L (ref 0–44)
AST: 18 U/L (ref 15–41)
Albumin: 4.3 g/dL (ref 3.5–5.0)
Alkaline Phosphatase: 60 U/L (ref 38–126)
Anion gap: 7 (ref 5–15)
BUN: 22 mg/dL (ref 8–23)
CO2: 31 mmol/L (ref 22–32)
Calcium: 10.9 mg/dL — ABNORMAL HIGH (ref 8.9–10.3)
Chloride: 102 mmol/L (ref 98–111)
Creatinine: 1.64 mg/dL — ABNORMAL HIGH (ref 0.44–1.00)
GFR, Estimated: 31 mL/min — ABNORMAL LOW (ref >60)
Glucose, Bld: 110 mg/dL — ABNORMAL HIGH (ref 70–99)
Potassium: 4.6 mmol/L (ref 3.5–5.1)
Sodium: 140 mmol/L (ref 135–145)
Total Bilirubin: 0.3 mg/dL (ref 0.3–1.2)
Total Protein: 6.4 g/dL — ABNORMAL LOW (ref 6.5–8.1)

## 2023-02-14 MED ORDER — INFLUENZA VAC A&B SURF ANT ADJ 0.5 ML IM SUSY
0.5000 mL | PREFILLED_SYRINGE | Freq: Once | INTRAMUSCULAR | Status: AC
  Administered 2023-02-14: 0.5 mL via INTRAMUSCULAR
  Filled 2023-02-14: qty 0.5

## 2023-02-14 NOTE — Progress Notes (Signed)
[facility] OFFICE PROGRESS NOTE   Diagnosis: Non-Hodgkin's lymphoma  INTERVAL HISTORY:   [name] [name] returns as scheduled.  She reports right mid back pain for the past 3 weeks.  The pain is intermittent and improves when she lies down.  The pain feels different than the postherpetic pain she feels inferior to the right breast.  No fever.  Good appetite.  She has intermittent sweats at the head and neck at night.  No whole body sweats. [name] [name] has been referred to Dr. Gerrit [name] to evaluate hypercalcemia. Objective:  Vital signs in last 24 hours:  Blood pressure 109/67, pulse 72, temperature 98.2 F (36.8 C), temperature source Temporal, resp. rate 18, height 5\' 2"  (1.575 m), weight 129 lb 11.2 oz (58.8 kg), SpO2 100%.    Lymphatics: No cervical, supraclavicular, axillary, or inguinal nodes Resp: Lungs clear bilaterally Cardio: Regular rate and rhythm GI: No mass, nontender, no hepatosplenomegaly Vascular: No leg edema Musculoskeletal: No tenderness at the right flank or right scapular area.  No rash.   Lab Results:  Lab Results  Component Value Date   WBC 9.7 01/18/2023   HGB 13.3 01/18/2023   HCT 40.1 01/18/2023   MCV 94 01/18/2023   PLT 312 01/18/2023   NEUTROABS 6.7 01/18/2023    CMP  Lab Results  Component Value Date   NA 140 02/14/2023   K 4.6 02/14/2023   CL 102 02/14/2023   CO2 31 02/14/2023   GLUCOSE 110 (H) 02/14/2023   BUN 22 02/14/2023   CREATININE 1.64 (H) 02/14/2023   CALCIUM 10.9 (H) 02/14/2023   PROT 6.4 (L) 02/14/2023   ALBUMIN 4.3 02/14/2023   AST 18 02/14/2023   ALT 18 02/14/2023   ALKPHOS 60 02/14/2023   BILITOT 0.3 02/14/2023   GFRNONAA 31 (L) 02/14/2023   GFRAA >60 01/17/2020    No results found for: "CEA1", "CEA", "CAN199", "CA125"  Lab Results  Component Value Date   INR 0.94 06/16/2018   LABPROT 12.5 06/16/2018    Imaging:  No results found.  Medications: I have reviewed the patient's current  medications.   Assessment/Plan:  Non-Hodgkin's lymphoma-follicular lymphoma Mesenteric lymph node biopsy 07/15/1999- follicular center cell lymphoma, grade 2 Treated with mitoxantrone, fludarabine, and rituximab completed in January 2002 Progressive disease in February 2004, treated with single agent rituximab February 2004 through August 2005 CTs 06/21/2014- no evidence of lymphoma CT abdomen/pelvis 06/05/2018- enlarging left mid abdominal mesenteric mass CT biopsy of the mesenteric mass 06/16/2018-follicular lymphoma, low-grade, CD20 positive Cycle 1 weekly Rituxan 07/24/2018 Cycle 2 weekly Rituxan 07/31/2018  Cycle 3 weekly Rituxan 08/07/2018 Cycle 4 weekly Rituxan 08/14/2018 CT abdomen/pelvis 10/09/2018- reduction in size of left lower quadrant mesenteric mass Cycle 1 maintenance rituximab 10/19/2018 Cycle 2 maintenance Rituxan 12/14/2018 Cycle 3 maintenance Rituxan 02/08/2019 Cycle 4 maintenance Rituxan 04/09/2019 CT abdomen/pelvis 05/31/2019-no change in jejunal mesenteric mass, no evidence of disease progression. Cycle 5 maintenance rituximab 06/07/2019 Cycle 6 maintenance rituximab 08/02/2019 Cycle 7 maintenance rituximab 09/27/2019 CT abdomen/pelvis 11/20/2019-further contraction of the lesion at the small bowel mesentery, no evidence of progressive lymphoma Cycle 8 maintenance rituximab 11/22/2019 Cycle 9 maintenance rituximab 01/17/2020 Cycle 10 maintenance rituximab 03/13/2020 Cycle 11 maintenance rituximab 05/08/2020 Cycle 12 maintenance rituximab 07/03/2020 Cycle 13 maintenance rituximab 08/28/2020 CTs 10/28/2020-unchanged soft tissue in the left abdominal small bowel mesentery, no evidence of lymphadenopathy or recurrent disease elsewhere in the chest, abdomen, or pelvis CTs 08/10/2022-stable posttreatment appearance of soft tissue in the left small bowel mesentery, no evidence of recurrent  lymphoma Irritable bowel syndrome History of anal incontinence Osteopenia History of colon polyps Weakness,  flushing, presyncope and chills during Rituxan infusion 07/24/2018. Mild hypercalcemia-chronic, diagnosed with mild primary hyperparathyroidism-followed by endocrinology COVID-19 04/10/2021 Zoster rash, right posterior lateral chest wall 04/30/2021   Disposition: [name] [name] has a history of non-Hodgkin's lymphoma.  She has been maintained off of specific treatment for lymphoma since April 2022.  She has chronic hypercalcemia, most likely related to a parathyroid adenoma.  She has been referred to Dr. Gerrit [name].  The creatinine is elevated today.  I doubt this is related to the chronic hypercalcemia.  She has discomfort at the right back.  I recommend a repeat creatinine and then renal ultrasound if the creatinine is persistently elevated.  She plans to follow-up with Dr. Adriana [name] this week.  She will return for an office visit here in 3 months.  I am available to see her sooner as needed.  She would like to schedule an appointment with Dr. Gerrit [name] to discuss the indication for imaging and surgery if she has a parathyroid adenoma.  Thornton Papas, MD  02/14/2023  3:24 PM

## 2023-02-15 ENCOUNTER — Telehealth: Payer: Self-pay

## 2023-02-15 ENCOUNTER — Other Ambulatory Visit: Payer: Self-pay

## 2023-02-15 DIAGNOSIS — R748 Abnormal levels of other serum enzymes: Secondary | ICD-10-CM

## 2023-02-15 NOTE — Telephone Encounter (Signed)
Called and spoke with patient. Advised per Dr Adriana Simas patient needs BMP and Renal US for elevated creatinine. Patient verbalized understanding.

## 2023-02-16 ENCOUNTER — Ambulatory Visit: Payer: PPO | Admitting: "Endocrinology

## 2023-02-16 DIAGNOSIS — R748 Abnormal levels of other serum enzymes: Secondary | ICD-10-CM | POA: Diagnosis not present

## 2023-02-17 ENCOUNTER — Ambulatory Visit (HOSPITAL_COMMUNITY)
Admission: RE | Admit: 2023-02-17 | Discharge: 2023-02-17 | Disposition: A | Discharge status: Home / Self Care | Payer: PPO | Source: Ambulatory Visit | Attending: Family Medicine | Admitting: Family Medicine

## 2023-02-17 DIAGNOSIS — R748 Abnormal levels of other serum enzymes: Secondary | ICD-10-CM | POA: Diagnosis not present

## 2023-02-17 DIAGNOSIS — N2889 Other specified disorders of kidney and ureter: Secondary | ICD-10-CM | POA: Diagnosis not present

## 2023-02-17 LAB — BASIC METABOLIC PANEL
BUN/Creatinine Ratio: 24 (ref 12–28)
BUN: 21 mg/dL (ref 8–27)
CO2: 25 mmol/L (ref 20–29)
Calcium: 11 mg/dL — ABNORMAL HIGH (ref 8.7–10.3)
Chloride: 100 mmol/L (ref 96–106)
Creatinine, Ser: 0.86 mg/dL (ref 0.57–1.00)
Glucose: 92 mg/dL (ref 70–99)
Potassium: 4.9 mmol/L (ref 3.5–5.2)
Sodium: 139 mmol/L (ref 134–144)
eGFR: 67 mL/min/{1.73_m2} (ref >59)

## 2023-02-18 ENCOUNTER — Ambulatory Visit: Payer: PPO | Admitting: Family Medicine

## 2023-03-02 ENCOUNTER — Ambulatory Visit: Payer: PPO | Admitting: Family Medicine

## 2023-03-07 ENCOUNTER — Encounter: Payer: Self-pay | Admitting: Family Medicine

## 2023-03-07 ENCOUNTER — Ambulatory Visit (INDEPENDENT_AMBULATORY_CARE_PROVIDER_SITE_OTHER): Payer: PPO | Admitting: Family Medicine

## 2023-03-07 VITALS — BP 120/78 | HR 72 | Temp 97.2°F | Ht 62.0 in | Wt 130.0 lb

## 2023-03-07 DIAGNOSIS — R93429 Abnormal radiologic findings on diagnostic imaging of unspecified kidney: Secondary | ICD-10-CM | POA: Insufficient documentation

## 2023-03-07 DIAGNOSIS — I10 Essential (primary) hypertension: Secondary | ICD-10-CM | POA: Diagnosis not present

## 2023-03-07 NOTE — Patient Instructions (Signed)
Lab in 3 months.  Take care  Dr. Adriana Simas

## 2023-03-07 NOTE — Assessment & Plan Note (Signed)
Stable on Atenolol.

## 2023-03-07 NOTE — Assessment & Plan Note (Signed)
BP well controlled.  Advised to stay hydrated. Recheck renal function in 3 months.

## 2023-03-07 NOTE — Progress Notes (Signed)
Subjective:  Patient ID: Darrick Grinder, female    DOB: 1941/03/13  Age: 82 y.o. MRN: [id number]  CC:   Chief Complaint  Patient presents with   discuss renal ultrasound     HPI:  82 year old female with the below mentioned medical problems presents to discuss recent renal ultrasound.  Patient recently had labs obtained by her oncologist.  Creatinine was elevated at 1.64.  Oncology recommended repeat her laboratory studies and obtain renal ultrasound.  Repeat creatinine was back to normal at 0.86.  Renal ultrasound revealed renal cortical thinning and increased cortical echogenicity seen with chronic medical renal disease.  Patient is here to discuss this today.  Blood pressure is well-controlled.  Patient has no other issues or concerns at this time.  Patient Active Problem List   Diagnosis Date Noted   Abnormal renal ultrasound 03/07/2023   Acute thoracic back pain 01/18/2023   Hyperparathyroidism, primary (HCC) 12/17/2021   Age-related osteoporosis without current pathological fracture 12/17/2021   Peripheral neuropathy 08/26/2016   Dyssynergic defecation 12/31/2015   HTN (hypertension) 04/18/2015   NSVT (nonsustained ventricular tachycardia) (HCC) 11/27/2013   Allergic rhinitis 08/20/2013   Hyperlipidemia 08/27/2008   MVP (mitral valve prolapse) 08/27/2008    IBS- diarrhea predominant 10/13/2007   NHL (non-Hodgkin's lymphoma) (HCC) 06/16/2007    Social Hx   Social History   Socioeconomic History   Marital status: Widowed    Spouse name: Not on file   Number of children: 1   Years of education: Not on file   Highest education level: 12th grade  Occupational History   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  Tobacco Use   Smoking status: Former    Current packs/day: 0.33    Types: Cigarettes   Smokeless tobacco: Never   Tobacco comments:    quit 1976  Vaping Use   Vaping status: Never Used  Substance and Sexual Activity   Alcohol use: Not  Currently    Comment: very rare   Drug use: No   Sexual activity: Yes    Birth control/protection: Surgical  Other Topics Concern   Not on file  Social History Narrative   Retired, widowed, 1 child   Rare EtOH, former smoker none now   No drugs   Right handed   Social Determinants of Health   Financial Resource Strain: Low Risk  (03/03/2023)   Overall Financial Resource Strain (CARDIA)    Difficulty of Paying Living Expenses: Not hard at all  Food Insecurity: No Food Insecurity (03/03/2023)   Hunger Vital Sign    Worried About Running Out of Food in the Last Year: Never true    Ran Out of Food in the Last Year: Never true  Transportation Needs: No Transportation Needs (03/03/2023)   PRAPARE - Administrator, Civil Service (Medical): No    Lack of Transportation (Non-Medical): No  Physical Activity: Sufficiently Active (03/03/2023)   Exercise Vital Sign    Days of Exercise per Week: 5 days    Minutes of Exercise per Session: 60 min  Stress: No Stress Concern Present (03/03/2023)   [employer] of Occupational Health - Occupational Stress Questionnaire    Feeling of Stress : Not at all  Social Connections: Moderately Integrated (03/03/2023)   Social Connection and Isolation Panel [NHANES]    Frequency of Communication with Friends and Family: More than three times a week    Frequency of Social Gatherings with Friends and Family: More than three  times a week    Attends Religious Services: More than 4 times per year    Active Member of Clubs or Organizations: Yes    Attends Banker Meetings: 1 to 4 times per year    Marital Status: Widowed  Recent Concern: Social Connections - Moderately Isolated (01/17/2023)   Social Connection and Isolation Panel [NHANES]    Frequency of Communication with Friends and Family: More than three times a week    Frequency of Social Gatherings with Friends and Family: Three times a week    Attends Religious Services:  More than 4 times per year    Active Member of Clubs or Organizations: No    Attends Banker Meetings: Not on file    Marital Status: Widowed    Review of Systems Per HPI  Objective:  BP 120/78   Pulse 72   Temp (!) 97.2 F (36.2 C)   Ht 5\' 2"  (1.575 m)   Wt 130 lb (59 kg)   SpO2 100%   BMI 23.78 kg/m      03/07/2023   10:49 AM 02/14/2023    2:15 PM 02/04/2023    2:41 PM  BP/Weight  Systolic BP 120 109 126  Diastolic BP 78 67 66  Wt. (Lbs) 130 129.7 128.4  BMI 23.78 kg/m2 23.72 kg/m2 23.48 kg/m2    Physical Exam Vitals and nursing note reviewed.  Constitutional:      General: She is not in acute distress.    Appearance: Normal appearance.  HENT:     Head: Normocephalic and atraumatic.  Cardiovascular:     Rate and Rhythm: Normal rate and regular rhythm.  Pulmonary:     Effort: Pulmonary effort is normal.     Breath sounds: Normal breath sounds.  Neurological:     Mental Status: She is alert.  Psychiatric:        Mood and Affect: Mood normal.        Behavior: Behavior normal.     Lab Results  Component Value Date   WBC 9.7 01/18/2023   HGB 13.3 01/18/2023   HCT 40.1 01/18/2023   PLT 312 01/18/2023   GLUCOSE 92 02/16/2023   CHOL 178 01/18/2023   TRIG 179 (H) 01/18/2023   HDL 51 01/18/2023   LDLCALC 96 01/18/2023   ALT 18 02/14/2023   AST 18 02/14/2023   NA 139 02/16/2023   K 4.9 02/16/2023   CL 100 02/16/2023   CREATININE 0.86 02/16/2023   BUN 21 02/16/2023   CO2 25 02/16/2023   TSH 3.330 01/18/2023   INR 0.94 06/16/2018   HGBA1C 5.3 10/05/2019     Assessment & Plan:   Problem List Items Addressed This Visit       Cardiovascular and Mediastinum   HTN (hypertension)    Stable on Atenolol.       Relevant Orders   Basic Metabolic Panel     Other   Abnormal renal ultrasound - Primary    BP well controlled.  Advised to stay hydrated. Recheck renal function in 3 months.        Follow-up:  Return in about 6 months  (around 09/04/2023).  Everlene Other DO [facility]

## 2023-03-15 DIAGNOSIS — E210 Primary hyperparathyroidism: Secondary | ICD-10-CM | POA: Diagnosis not present

## 2023-03-15 DIAGNOSIS — E213 Hyperparathyroidism, unspecified: Secondary | ICD-10-CM | POA: Diagnosis not present

## 2023-03-15 DIAGNOSIS — M810 Age-related osteoporosis without current pathological fracture: Secondary | ICD-10-CM | POA: Diagnosis not present

## 2023-03-21 ENCOUNTER — Other Ambulatory Visit (HOSPITAL_COMMUNITY): Payer: Self-pay | Admitting: Surgery

## 2023-03-21 DIAGNOSIS — E210 Primary hyperparathyroidism: Secondary | ICD-10-CM

## 2023-03-23 ENCOUNTER — Other Ambulatory Visit (HOSPITAL_COMMUNITY): Payer: Self-pay | Admitting: Surgery

## 2023-03-23 DIAGNOSIS — E213 Hyperparathyroidism, unspecified: Secondary | ICD-10-CM

## 2023-03-29 ENCOUNTER — Ambulatory Visit (HOSPITAL_COMMUNITY)
Admission: RE | Admit: 2023-03-29 | Discharge: 2023-03-29 | Disposition: A | Discharge status: Home / Self Care | Payer: PPO | Source: Ambulatory Visit | Attending: Surgery | Admitting: Surgery

## 2023-03-29 ENCOUNTER — Other Ambulatory Visit (HOSPITAL_COMMUNITY): Payer: Self-pay | Admitting: Surgery

## 2023-03-29 DIAGNOSIS — D351 Benign neoplasm of parathyroid gland: Secondary | ICD-10-CM | POA: Diagnosis not present

## 2023-03-29 DIAGNOSIS — E213 Hyperparathyroidism, unspecified: Secondary | ICD-10-CM | POA: Diagnosis not present

## 2023-03-29 DIAGNOSIS — E0590 Thyrotoxicosis, unspecified without thyrotoxic crisis or storm: Secondary | ICD-10-CM | POA: Diagnosis not present

## 2023-03-29 DIAGNOSIS — E210 Primary hyperparathyroidism: Secondary | ICD-10-CM

## 2023-03-29 MED ORDER — TECHNETIUM TC 99M SESTAMIBI GENERIC - CARDIOLITE
25.0000 | Freq: Once | INTRAVENOUS | Status: AC | PRN
  Administered 2023-03-29: 25 via INTRAVENOUS

## 2023-03-30 ENCOUNTER — Ambulatory Visit: Payer: Self-pay | Admitting: Surgery

## 2023-03-30 NOTE — Progress Notes (Signed)
USN and sestamibi scan both indicate a right inferior parathyroid adenoma.  Will enter orders and send to schedulers to contact the patient.  Will plan to proceed with minimally invasive outpatient surgery as discussed in the office.  Darnell Level, MD [facility] practice Office: [phone]

## 2023-04-05 ENCOUNTER — Other Ambulatory Visit: Payer: Self-pay | Admitting: Cardiovascular Disease

## 2023-04-07 DIAGNOSIS — D485 Neoplasm of uncertain behavior of skin: Secondary | ICD-10-CM | POA: Diagnosis not present

## 2023-04-07 DIAGNOSIS — L821 Other seborrheic keratosis: Secondary | ICD-10-CM | POA: Diagnosis not present

## 2023-04-19 ENCOUNTER — Ambulatory Visit: Payer: Self-pay | Admitting: Surgery

## 2023-04-19 NOTE — Progress Notes (Signed)
Both the ultrasound and the sestamibi scan localize a right sided parathyroid adenoma.  Will plan to proceed with minimally invasive parathyroidectomy as we discussed in the office.  Tresa Endo - please forward orders to the schedulers to contact the patient.  Darnell Level, MD [facility] practice Office: [phone]

## 2023-04-19 NOTE — Progress Notes (Signed)
COVID Vaccine Completed:  Yes  Date of COVID positive in last 90 days:  PCP - Everlene Other, DO Cardiologist - Kristeen Miss, MD  Chest x-ray - CT chest 08-10-22 Epic EKG - 07-22-22 Epic Stress Test - 10-12-10 Epic ECHO - 12-04-20 Epic Cardiac Cath -  Pacemaker/ICD device last checked: Spinal Cord Stimulator: MR Cardiac - 03-28-18 Epic Non-telemetry monitor - 02-01-18 Epic  Bowel Prep -   Sleep Study -  CPAP -   Fasting Blood Sugar -  Checks Blood Sugar _____ times a day  Last dose of GLP1 agonist-  N/A GLP1 instructions:  Hold 7 days before surgery    Last dose of SGLT-2 inhibitors-  N/A SGLT-2 instructions:  Hold 3 days before surgery    Blood Thinner Instructions:  Time Aspirin Instructions: Last Dose:  Activity level:  Can go up a flight of stairs and perform activities of daily living without stopping and without symptoms of chest pain or shortness of breath.  Able to exercise without symptoms  Unable to go up a flight of stairs without symptoms of     Anesthesia review:  MVP, NSVT, HTN  Patient denies shortness of breath, fever, cough and chest pain at PAT appointment  Patient verbalized understanding of instructions that were given to them at the PAT appointment. Patient was also instructed that they will need to review over the PAT instructions again at home before surgery.

## 2023-04-19 NOTE — Patient Instructions (Addendum)
SURGICAL WAITING ROOM VISITATION Patients having surgery or a procedure may have no more than 2 support people in the waiting area - these visitors may rotate.    Children under the age of 27 must have an adult with them who is not the patient.  If the patient needs to stay at the hospital during part of their recovery, the visitor guidelines for inpatient rooms apply. Pre-op nurse will coordinate an appropriate time for 1 support person to accompany patient in pre-op.  This support person may not rotate.    Please refer to the [facility] website for the visitor guidelines for Inpatients (after your surgery is over and you are in a regular room).       Your procedure is scheduled on: 05-13-23   Report to [facility] Main Entrance    Report to admitting at 10:45 AM   Call this number if you have problems the morning of surgery [phone]   Do not eat food :After Midnight.   After Midnight you may have the following liquids until 10:00 AM DAY OF SURGERY  Water Non-Citrus Juices (without pulp, NO RED-Apple, White grape, White cranberry) Black Coffee (NO MILK/CREAM OR CREAMERS, sugar ok)  Clear Tea (NO MILK/CREAM OR CREAMERS, sugar ok) regular and decaf                             Plain Jell-O (NO RED)                                           Fruit ices (not with fruit pulp, NO RED)                                     Popsicles (NO RED)                                                               Sports drinks like Gatorade (NO RED)                      If you have questions, please contact your surgeon's office.   FOLLOW ANY ADDITIONAL PRE OP INSTRUCTIONS YOU RECEIVED FROM YOUR SURGEON'S OFFICE!!!     Oral Hygiene is also important to reduce your risk of infection.                                    Remember - BRUSH YOUR TEETH THE MORNING OF SURGERY WITH YOUR REGULAR TOOTHPASTE   Do NOT smoke after Midnight   Take these medicines the morning of surgery with A SIP  OF WATER:   Atenolol  Allegra  Pantoprazole  Rosuvastatin  If needed Alprazolam, Meclizine  Stop all vitamins and herbal supplements 7 days before surgery  Bring CPAP mask and tubing day of surgery.  You may not have any metal on your body including hair pins, jewelry, and body piercing             Do not wear make-up, lotions, powders, perfumes or deodorant  Do not wear nail polish including gel and S&S, artificial/acrylic nails, or any other type of covering on natural nails including finger and toenails. If you have artificial nails, gel coating, etc. that needs to be removed by a nail salon please have this removed prior to surgery or surgery may need to be canceled/ delayed if the surgeon/ anesthesia feels like they are unable to be safely monitored.   Do not shave  48 hours prior to surgery.    Do not bring valuables to the hospital. [name] IS NOT RESPONSIBLE   FOR VALUABLES.   Contacts, dentures or bridgework may not be worn into surgery.  DO NOT BRING YOUR HOME MEDICATIONS TO THE HOSPITAL. PHARMACY WILL DISPENSE MEDICATIONS LISTED ON YOUR MEDICATION LIST TO YOU DURING YOUR ADMISSION IN THE HOSPITAL!    Patients discharged on the day of surgery will not be allowed to drive home.  Someone NEEDS to stay with you for the first 24 hours after anesthesia.   Special Instructions: Bring a copy of your healthcare power of attorney and living will documents the day of surgery if you haven't scanned them before.              Please read over the following fact sheets you were given: IF YOU HAVE QUESTIONS ABOUT YOUR PRE-OP INSTRUCTIONS PLEASE CALL [phone] [name]  If you received a COVID test during your pre-op visit  it is requested that you wear a mask when out in public, stay away from anyone that may not be feeling well and notify your surgeon if you develop symptoms. If you test positive for Covid or have been in contact with anyone that has tested  positive in the last 10 days please notify you surgeon.  [name] - Preparing for Surgery Before surgery, you can play an important role.  Because skin is not sterile, your skin needs to be as free of germs as possible.  You can reduce the number of germs on your skin by washing with CHG (chlorahexidine gluconate) soap before surgery.  CHG is an antiseptic cleaner which kills germs and bonds with the skin to continue killing germs even after washing. Please DO NOT use if you have an allergy to CHG or antibacterial soaps.  If your skin becomes reddened/irritated stop using the CHG and inform your nurse when you arrive at Short Stay. Do not shave (including legs and underarms) for at least 48 hours prior to the first CHG shower.  You may shave your face/neck.  Please follow these instructions carefully:  1.  Shower with CHG Soap the night before surgery and the  morning of surgery.  2.  If you choose to wash your hair, wash your hair first as usual with your normal  shampoo.  3.  After you shampoo, rinse your hair and body thoroughly to remove the shampoo.                             4.  Use CHG as you would any other liquid soap.  You can apply chg directly to the skin and wash.  Gently with a scrungie or clean washcloth.  5.  Apply the CHG Soap to your body ONLY FROM THE  NECK DOWN.   Do   not use on face/ open                           Wound or open sores. Avoid contact with eyes, ears mouth and   genitals (private parts).                       Wash face,  Genitals (private parts) with your normal soap.             6.  Wash thoroughly, paying special attention to the area where your    surgery  will be performed.  7.  Thoroughly rinse your body with warm water from the neck down.  8.  DO NOT shower/wash with your normal soap after using and rinsing off the CHG Soap.                9.  Pat yourself dry with a clean towel.            10.  Wear clean pajamas.            11.  Place clean sheets on  your bed the night of your first shower and do not  sleep with pets. Day of Surgery : Do not apply any lotions/deodorants the morning of surgery.  Please wear clean clothes to the hospital/surgery center.  FAILURE TO FOLLOW THESE INSTRUCTIONS MAY RESULT IN THE CANCELLATION OF YOUR SURGERY  PATIENT SIGNATURE_________________________________  NURSE SIGNATURE__________________________________  ________________________________________________________________________

## 2023-04-19 NOTE — Progress Notes (Signed)
Patient is already scheduled.  OR date is January 10th.  tmg  Darnell Level, MD [facility] practice Office: [phone]

## 2023-04-20 ENCOUNTER — Encounter (HOSPITAL_COMMUNITY): Payer: Self-pay

## 2023-04-20 ENCOUNTER — Other Ambulatory Visit: Payer: Self-pay

## 2023-04-20 ENCOUNTER — Encounter (HOSPITAL_COMMUNITY)
Admission: RE | Admit: 2023-04-20 | Discharge: 2023-04-20 | Disposition: A | Discharge status: Home / Self Care | Payer: PPO | Source: Ambulatory Visit | Attending: Surgery | Admitting: Surgery

## 2023-04-20 VITALS — BP 153/69 | HR 78 | Temp 98.6°F | Resp 16 | Ht 62.0 in | Wt 128.6 lb

## 2023-04-20 DIAGNOSIS — Z01818 Encounter for other preprocedural examination: Secondary | ICD-10-CM

## 2023-04-20 DIAGNOSIS — Z01812 Encounter for preprocedural laboratory examination: Secondary | ICD-10-CM | POA: Insufficient documentation

## 2023-04-20 DIAGNOSIS — I10 Essential (primary) hypertension: Secondary | ICD-10-CM | POA: Diagnosis not present

## 2023-04-20 HISTORY — DX: Hyperparathyroidism, unspecified: E21.3

## 2023-04-20 LAB — BASIC METABOLIC PANEL
Anion gap: 11 (ref 5–15)
BUN: 24 mg/dL — ABNORMAL HIGH (ref 8–23)
CO2: 24 mmol/L (ref 22–32)
Calcium: 11.2 mg/dL — ABNORMAL HIGH (ref 8.9–10.3)
Chloride: 105 mmol/L (ref 98–111)
Creatinine, Ser: 0.68 mg/dL (ref 0.44–1.00)
GFR, Estimated: >60 mL/min (ref >60)
Glucose, Bld: 118 mg/dL — ABNORMAL HIGH (ref 70–99)
Potassium: 5.2 mmol/L — ABNORMAL HIGH (ref 3.5–5.1)
Sodium: 140 mmol/L (ref 135–145)

## 2023-04-20 LAB — CBC
HCT: 39.1 % (ref 36.0–46.0)
Hemoglobin: 12.9 g/dL (ref 12.0–15.0)
MCH: 30.8 pg (ref 26.0–34.0)
MCHC: 33 g/dL (ref 30.0–36.0)
MCV: 93.3 fL (ref 80.0–100.0)
Platelets: 288 10*3/uL (ref 150–400)
RBC: 4.19 MIL/uL (ref 3.87–5.11)
RDW: 13.3 % (ref 11.5–15.5)
WBC: 9.4 10*3/uL (ref 4.0–10.5)
nRBC: 0 % (ref 0.0–0.2)

## 2023-04-21 DIAGNOSIS — G245 Blepharospasm: Secondary | ICD-10-CM | POA: Diagnosis not present

## 2023-04-21 DIAGNOSIS — H04129 Dry eye syndrome of unspecified lacrimal gland: Secondary | ICD-10-CM | POA: Diagnosis not present

## 2023-04-21 DIAGNOSIS — H25813 Combined forms of age-related cataract, bilateral: Secondary | ICD-10-CM | POA: Diagnosis not present

## 2023-04-21 DIAGNOSIS — R482 Apraxia: Secondary | ICD-10-CM | POA: Diagnosis not present

## 2023-04-22 ENCOUNTER — Ambulatory Visit: Payer: PPO | Admitting: Internal Medicine

## 2023-04-22 ENCOUNTER — Encounter: Payer: Self-pay | Admitting: Internal Medicine

## 2023-04-22 VITALS — BP 126/70 | HR 63 | Ht 62.0 in | Wt 129.8 lb

## 2023-04-22 DIAGNOSIS — K222 Esophageal obstruction: Secondary | ICD-10-CM

## 2023-04-22 DIAGNOSIS — K219 Gastro-esophageal reflux disease without esophagitis: Secondary | ICD-10-CM

## 2023-04-22 DIAGNOSIS — K580 Irritable bowel syndrome with diarrhea: Secondary | ICD-10-CM

## 2023-04-22 NOTE — Patient Instructions (Addendum)
  Please continue taking Pantoprazole as prescribed for your GERD. Maintain your current regimen of probiotics and Benefiber for IBS management, and consider pelvic floor physical therapy exercises. Monitor your bowel habits and report any alarming symptoms such as bleeding. Follow up with [place] if you notice any significant changes or have concerns.  _______________________________________________________  If your blood pressure at your visit was 140/90 or greater, please contact your primary care physician to follow up on this.  _______________________________________________________  If you are age 10 or older, your body mass index should be between 23-30. Your Body mass index is 23.74 kg/m. If this is out of the aforementioned range listed, please consider follow up with your Primary Care Provider.  If you are age 71 or younger, your body mass index should be between 19-25. Your Body mass index is 23.74 kg/m. If this is out of the aformentioned range listed, please consider follow up with your Primary Care Provider.   ________________________________________________________  The [place] [facility] providers would like to encourage you to use [facility] to communicate with providers for non-urgent requests or questions.  Due to long hold times on the telephone, sending your provider a message by [facility] may be a faster and more efficient way to get a response.  Please allow 48 business hours for a response.  Please remember that this is for non-urgent requests.  _______________________________________________________  I appreciate the opportunity to care for you. Stan Head, MD, [facility]

## 2023-04-22 NOTE — Anesthesia Preprocedure Evaluation (Addendum)
Anesthesia Evaluation  Patient identified by MRN, date of birth, ID band Patient awake    Reviewed: Allergy & Precautions, NPO status , Patient's Chart, lab work & pertinent test results, reviewed documented beta blocker date and time   History of Anesthesia Complications (+) PONV and history of anesthetic complications  Airway Mallampati: III  TM Distance: >3 FB Neck ROM: Full  Mouth opening: Limited Mouth Opening  Dental no notable dental hx.    Pulmonary former smoker   breath sounds clear to auscultation       Cardiovascular hypertension, (-) Past MI, (-) Cardiac Stents and (-) CABG + Valvular Problems/Murmurs MR  Rhythm:Regular Rate:Normal  Recent TTE, normal LV, normal RV, mild MR   Neuro/Psych  Neuromuscular disease    GI/Hepatic ,GERD  ,,  Endo/Other    Renal/GU      Musculoskeletal  (+) Arthritis ,    Abdominal   Peds  Hematology   Anesthesia Other Findings   Reproductive/Obstetrics                             Anesthesia Physical Anesthesia Plan  ASA: 2  Anesthesia Plan: General   Post-op Pain Management:    Induction: Intravenous  PONV Risk Score and Plan: 2 and TIVA and Ondansetron  Airway Management Planned: Oral ETT  Additional Equipment:   Intra-op Plan:   Post-operative Plan: Extubation in OR  Informed Consent: I have reviewed the patients History and Physical, chart, labs and discussed the procedure including the risks, benefits and alternatives for the proposed anesthesia with the patient or authorized representative who has indicated his/her understanding and acceptance.     Dental advisory given  Plan Discussed with: CRNA  Anesthesia Plan Comments: (See PAT note from 12/18 by K Jefm Bryant PA-C )        Anesthesia Quick Evaluation

## 2023-04-22 NOTE — Progress Notes (Signed)
[name] [name] 82 y.o. 11-21-1940 [id number]  Assessment & Plan:   Encounter Diagnoses  Name Primary?   GERD with stricture Yes   Irritable bowel syndrome with diarrhea    She seems stable overall and definitely improved after esophageal dilation.  Would continue with current regimen. We discussed repeat colonoscopy and I do not recommend a routine repeat colonoscopy.  She did have an elderly father with colon cancer but she is only had 1 diminutive adenoma in her lifetime and her signs and symptoms do not warrant 1 in my opinion.  Should she develop persistent changes or something different we would reconsider.  I explained the weak association of dementia and other health conditions with PPIs and that this is an association and not a proven cause-and-effect.  There are other studies that show no association.  She was reassured and will continue to take her PPI to reduce the potential for further esophageal dilation.  Follow-up as needed.    Subjective:   Chief Complaint: Follow-up of IBS  HPI 82 yo ww hx non-hodgkin's lymphoma as well as a history of GERD with stricture, IBS, family history of colon cancer and pelvic floor issues plus rectocele .  She was last seen here in April of this year and underwent an EGD and dilation of a lower esophageal stricture on August 24, 2022, dilation to 20 mm.  3 cm hiatal hernia.  [name] [name] reports no further dysphagia since the dilation of the esophageal stricture and well-controlled reflux symptoms on current medical regimen of pantoprazole.  She has learned that pantoprazole and other PPI medications are associated with dementia, she actually indicates she heard that they cause it.  Her IBS is stable though a couple of times a month she will have multiple bowel movements in the morning that prevent her from doing things so much so that she tends to avoid scheduling morning appointments.  She had some extra defecation yesterday and took an  Imodium and has not moved her bowels today.  As the stools progressed they get thinner.  This pattern has been with her for a long time.  She was helped by pelvic PT in the past and she does try to remember some of her home exercise plan to do that.  There has been no rectal bleeding.  She continues with probiotic and Benefiber.  Current Meds  Medication Sig   ALPRAZolam (XANAX) 0.25 MG tablet Take one tab po at bedtime prn   atenolol (TENORMIN) 25 MG tablet TAKE 1 & 1/2 (ONE & ONE-HALF) TABLETS BY MOUTH ONCE DAILY   BOTOX 100 units SOLR injection Inject 100 Units into the muscle.   fexofenadine (ALLEGRA) 180 MG tablet Take 180 mg by mouth daily as needed for allergies.   meclizine (ANTIVERT) 25 MG tablet Take 1 tablet (25 mg total) by mouth 3 (three) times daily as needed for dizziness.   Multiple Vitamins-Minerals (MULTIVITAMIN GUMMIES ADULT) CHEW Chew 1 tablet by mouth daily.   pantoprazole (PROTONIX) 40 MG tablet TAKE 1 TABLET BY MOUTH ONCE DAILY BEFORE BREAKFAST   Polyvinyl Alcohol-Povidone PF 1.4-0.6 % SOLN Place 1 drop into both ears 3 (three) times daily.   predniSONE (DELTASONE) 5 MG tablet Take 5 mg by mouth daily.   Probiotic Product (ALIGN PO) Take 1 capsule by mouth daily.   rosuvastatin (CRESTOR) 5 MG tablet Take 1 tablet by mouth on Mon, Wed and Fri only.   Wheat Dextrin (BENEFIBER) POWD Take 1 Scoop by mouth daily.  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   Manometry 12/2015   Arthritis    Brady-tachy syndrome (HCC)    Chest pain 10/16/2013   normal coronary arteries on cath and normal EF   Colitis, ischemic (HCC)    Colon polyp 07/29/1993   with focal adenomatous changes   Diverticulitis    Diverticulosis of colon    Dyspepsia    Dyssynergic defecation 12/31/2015   Anorectal mano 12/2015, also has rectal hypersensitivity   Fatty liver    GERD (gastroesophageal reflux disease)    Heart murmur    HLD (hyperlipidemia)     Hyperparathyroidism (HCC)    Hypertension    IBS (irritable bowel syndrome)    Incontinence, feces    MVP (mitral valve prolapse)    nhl dx'd 07/1999/ 06/2018   chemo comp 2001; rituxin comp 2005   Osteopenia    Palpitation    Rectocele    Vasovagal syncope    Past Surgical History:  Procedure Laterality Date   ABDOMINAL HYSTERECTOMY     ABDOMINAL SURGERY  2001   for non hodgkins lymphoma; lymphoma in mesentary   ANAL RECTAL MANOMETRY N/A 12/24/2015   Procedure: ANO RECTAL MANOMETRY;  Surgeon: Iva Boop, MD;  Location: [facility];  Service: Endoscopy;  Laterality: N/A;   BLADDER SUSPENSION     BUNIONECTOMY     CARDIAC CATHETERIZATION     CHOLECYSTECTOMY  2003   COLONOSCOPY  7/200/, 11/2007   diverticulosis   FINE NEEDLE ASPIRATION BIOPSY Left 06/16/2018   left mesentery tissue   LEFT HEART CATHETERIZATION WITH CORONARY ANGIOGRAM N/A 10/16/2013   Procedure: LEFT HEART CATHETERIZATION WITH CORONARY ANGIOGRAM;  Surgeon: Peter M Swaziland, MD;  Location: [facility] CATH LAB;  Service: Cardiovascular;  Laterality: N/A;   pelvic prolapse repair     TONSILLECTOMY     UPPER GASTROINTESTINAL ENDOSCOPY  10/2005   hiatal hernia   Social History   Social History Narrative   Retired, widowed, 1 child   Rare EtOH, former smoker none now   No drugs   Right handed   family history includes Colon cancer (age of onset: 38) in her father; Colon polyps in her brother and brother; Osteoporosis in her mother; Prostate cancer in her brother; Stomach cancer in her maternal grandfather and maternal uncle; Thyroid disease in her mother.   Review of Systems As above per HPI  Objective:   Physical Exam BP 126/70   Pulse 63   Ht 5\' 2"  (1.575 m)   Wt 129 lb 12.8 oz (58.9 kg)   SpO2 97%   BMI 23.74 kg/m   16 minutes total time on this visit before during and after patient interaction.

## 2023-04-22 NOTE — Progress Notes (Signed)
DISCUSSION: [name] [name] is an 82 yo female who presents to PAT prior to RIGHT INFERIOR PARATHYROIDECTOMY on 05/13/23 with Dr. Gerrit Friends. PMH of former smoking, HTN, MVP, brady-tachy syndrome, palpitations due to NSVT, hx of syncope, GERD, IBS, non-Hodgkin's lymphoma s/p chemo (2001), anxiety.  Patient follows with Cardiology for hx of palpitations and MVP. Prior ischemic w/u includes cardiac cath in 2015 due to complaints of CP and DOE which did not show CAD. She has had a cardiac MRI in 2019 which was normal. Holter monitoring showed NSVT which is controlled with atenolol. Last echo in 2022 showed normal LV function with mild MR. Last seen in clinic on 02/04/23 by Dr. Elease Hashimoto. She reported occasional chest discomfort which was non-exertional, occasional lightheadedness and palpitations. There were no further recommendations and advised to continue current medication regimen.  She follows with Oncology for hx of non Hodgkin's lymphoma. Not currently on any treatment.  VS: BP (!) 153/69   Pulse 78   Temp 37 C (Oral)   Resp 16   Ht 5\' 2"  (1.575 m)   Wt 58.3 kg   SpO2 100%   BMI 23.52 kg/m   PROVIDERS: Tommie Sams, DO Cardiologist:  Hollice Espy, [facility] Oncology: Thornton Papas, MD  LABS: Labs reviewed: Acceptable for surgery. Mild hyperkalemia (all labs ordered are listed, but only abnormal results are displayed)  Labs Reviewed  BASIC METABOLIC PANEL - Abnormal; Notable for the following components:      Result Value   Potassium 5.2 (*)    Glucose, Bld 118 (*)    BUN 24 (*)    Calcium 11.2 (*)    All other components within normal limits  CBC     IMAGES:   EKG:   CV: Echo 12/04/2020:  IMPRESSIONS    1. Left ventricular ejection fraction, by estimation, is 60 to 65%. Left ventricular ejection fraction by 3D volume is 64 %. The left ventricle has normal function. The left ventricle has no regional wall motion abnormalities. Left ventricular diastolic  parameters  were normal.  2. Right ventricular systolic function is normal. The right ventricular size is normal. There is normal pulmonary artery systolic pressure.  3. The mitral valve is normal in structure. Mild mitral valve regurgitation. No evidence of mitral stenosis.  4. The aortic valve is tricuspid. Aortic valve regurgitation is not visualized. No aortic stenosis is present.  5. The inferior vena cava is normal in size with greater than 50% respiratory variability, suggesting right atrial pressure of 3 mmHg.  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   Manometry 12/2015   Arthritis    Brady-tachy syndrome (HCC)    Chest pain 10/16/2013   normal coronary arteries on cath and normal EF   Colitis, ischemic (HCC)    Colon polyp 07/29/1993   with focal adenomatous changes   Diverticulitis    Diverticulosis of colon    Dyspepsia    Dyssynergic defecation 12/31/2015   Anorectal mano 12/2015, also has rectal hypersensitivity   Fatty liver    GERD (gastroesophageal reflux disease)    Heart murmur    HLD (hyperlipidemia)    Hyperparathyroidism (HCC)    Hypertension    IBS (irritable bowel syndrome)    Incontinence, feces    MVP (mitral valve prolapse)    nhl dx'd 07/1999/ 06/2018   chemo comp 2001; rituxin comp 2005   Osteopenia    Palpitation    Rectocele    Vasovagal syncope  Past Surgical History:  Procedure Laterality Date   ABDOMINAL HYSTERECTOMY     ABDOMINAL SURGERY  2001   for non hodgkins lymphoma; lymphoma in mesentary   ANAL RECTAL MANOMETRY N/A 12/24/2015   Procedure: ANO RECTAL MANOMETRY;  Surgeon: Iva Boop, MD;  Location: [facility];  Service: Endoscopy;  Laterality: N/A;   BLADDER SUSPENSION     BUNIONECTOMY     CARDIAC CATHETERIZATION     CHOLECYSTECTOMY  2003   COLONOSCOPY  7/200/, 11/2007   diverticulosis   FINE NEEDLE ASPIRATION BIOPSY Left 06/16/2018   left mesentery tissue   LEFT HEART CATHETERIZATION WITH  CORONARY ANGIOGRAM N/A 10/16/2013   Procedure: LEFT HEART CATHETERIZATION WITH CORONARY ANGIOGRAM;  Surgeon: Peter M Swaziland, MD;  Location: [facility] CATH LAB;  Service: Cardiovascular;  Laterality: N/A;   pelvic prolapse repair     TONSILLECTOMY     UPPER GASTROINTESTINAL ENDOSCOPY  10/2005   hiatal hernia    MEDICATIONS:  ALPRAZolam (XANAX) 0.25 MG tablet   atenolol (TENORMIN) 25 MG tablet   fexofenadine (ALLEGRA) 180 MG tablet   meclizine (ANTIVERT) 25 MG tablet   Multiple Vitamins-Minerals (MULTIVITAMIN GUMMIES ADULT) CHEW   pantoprazole (PROTONIX) 40 MG tablet   Polyvinyl Alcohol-Povidone PF 1.4-0.6 % SOLN   predniSONE (DELTASONE) 5 MG tablet   Probiotic Product (ALIGN PO)   rosuvastatin (CRESTOR) 5 MG tablet   Wheat Dextrin (BENEFIBER) POWD   No current facility-administered medications for this encounter.   Marcille Blanco [facility] Short Stay/Anesthesiology [facility] Phone [phone] 04/22/2023 9:28 AM

## 2023-05-09 DIAGNOSIS — G245 Blepharospasm: Secondary | ICD-10-CM | POA: Diagnosis not present

## 2023-05-09 DIAGNOSIS — H0279 Other degenerative disorders of eyelid and periocular area: Secondary | ICD-10-CM | POA: Diagnosis not present

## 2023-05-11 ENCOUNTER — Ambulatory Visit: Payer: PPO | Admitting: Family Medicine

## 2023-05-11 ENCOUNTER — Encounter: Payer: Self-pay | Admitting: Family Medicine

## 2023-05-11 VITALS — BP 131/80 | HR 77 | Temp 97.2°F | Ht 62.0 in | Wt 130.2 lb

## 2023-05-11 DIAGNOSIS — J329 Chronic sinusitis, unspecified: Secondary | ICD-10-CM | POA: Insufficient documentation

## 2023-05-11 MED ORDER — AMOXICILLIN-POT CLAVULANATE 875-125 MG PO TABS: 1.0000 | ORAL_TABLET | Freq: Two times a day (BID) | ORAL | 0 refills | Status: DC

## 2023-05-11 NOTE — Patient Instructions (Signed)
 Antibiotic as prescribed.  Call with concerns.

## 2023-05-11 NOTE — Assessment & Plan Note (Signed)
 Treating with Augmentin.

## 2023-05-11 NOTE — Progress Notes (Signed)
 Subjective:  Patient ID: [name] [name], female    DOB: 21-Feb-1941  Age: 83 y.o. MRN: [id number]  CC:   Chief Complaint  Patient presents with   Acute Visit    Nasal and chest Congestion, ear clogged, drainage, cough, horse. X 3wk    HPI:  83 year old female presents for evaluation of respiratory symptoms.  3-week history of cough, congestion, postnasal drip.  No relief with over-the-counter treatment.  Associated hoarseness/voice change.  No fever.  She had to reschedule her parathyroidectomy due to her respiratory illness.  Patient Active Problem List   Diagnosis Date Noted   Rhinosinusitis 05/11/2023   Abnormal renal ultrasound 03/07/2023   Acute thoracic back pain 01/18/2023   Hyperparathyroidism, primary (HCC) 12/17/2021   Age-related osteoporosis without current pathological fracture 12/17/2021   Peripheral neuropathy 08/26/2016   Dyssynergic defecation 12/31/2015   HTN (hypertension) 04/18/2015   NSVT (nonsustained ventricular tachycardia) (HCC) 11/27/2013   Allergic rhinitis 08/20/2013   Hyperlipidemia 08/27/2008   MVP (mitral valve prolapse) 08/27/2008    IBS- diarrhea predominant 10/13/2007   NHL (non-Hodgkin's lymphoma) (HCC) 06/16/2007    Social Hx   Social History   Socioeconomic History   Marital status: Widowed    Spouse name: Not on file   Number of children: 1   Years of education: Not on file   Highest education level: 12th grade  Occupational History   Occupation: Warehouse Manager work part time    Associate Professor: RETIRED  Tobacco Use   Smoking status: Former    Current packs/day: 0.33    Types: Cigarettes   Smokeless tobacco: Never   Tobacco comments:    quit 1976  Vaping Use   Vaping status: Never Used  Substance and Sexual Activity   Alcohol use: Not Currently    Comment: very rare   Drug use: No   Sexual activity: Yes    Birth control/protection: Surgical  Other Topics Concern   Not on file  Social History Narrative   Retired, widowed, 1  child   Rare EtOH, former smoker none now   No drugs   Right handed   Social Drivers of Corporate Investment Banker Strain: Low Risk  (03/03/2023)   Overall Financial Resource Strain (CARDIA)    Difficulty of Paying Living Expenses: Not hard at all  Food Insecurity: No Food Insecurity (03/03/2023)   Hunger Vital Sign    Worried About Running Out of Food in the Last Year: Never true    Ran Out of Food in the Last Year: Never true  Transportation Needs: No Transportation Needs (03/03/2023)   PRAPARE - Administrator, Civil Service (Medical): No    Lack of Transportation (Non-Medical): No  Physical Activity: Sufficiently Active (03/03/2023)   Exercise Vital Sign    Days of Exercise per Week: 5 days    Minutes of Exercise per Session: 60 min  Stress: No Stress Concern Present (03/03/2023)   [employer] of Occupational Health - Occupational Stress Questionnaire    Feeling of Stress : Not at all  Social Connections: Moderately Integrated (03/03/2023)   Social Connection and Isolation Panel [NHANES]    Frequency of Communication with Friends and Family: More than three times a week    Frequency of Social Gatherings with Friends and Family: More than three times a week    Attends Religious Services: More than 4 times per year    Active Member of [employer] or Organizations: Yes    Attends Banker Meetings:  1 to 4 times per year    Marital Status: Widowed  Recent Concern: Social Connections - Moderately Isolated (01/17/2023)   Social Connection and Isolation Panel [NHANES]    Frequency of Communication with Friends and Family: More than three times a week    Frequency of Social Gatherings with Friends and Family: Three times a week    Attends Religious Services: More than 4 times per year    Active Member of Clubs or Organizations: No    Attends Banker Meetings: Not on file    Marital Status: Widowed    Review of Systems Per HPI  Objective:   BP 131/80   Pulse 77   Temp (!) 97.2 F (36.2 C)   Ht 5' 2 (1.575 m)   Wt 130 lb 3.2 oz (59.1 kg)   SpO2 98%   BMI 23.81 kg/m      05/11/2023    9:50 AM 04/22/2023    9:29 AM 04/20/2023    1:50 PM  BP/Weight  Systolic BP 131 126 153  Diastolic BP 80 70 69  Wt. (Lbs) 130.2 129.8   BMI 23.81 kg/m2 23.74 kg/m2     Physical Exam Vitals and nursing note reviewed.  Constitutional:      General: She is not in acute distress.    Appearance: Normal appearance.  HENT:     Head: Normocephalic and atraumatic.     Nose: Congestion present.  Eyes:     Conjunctiva/sclera: Conjunctivae normal.  Cardiovascular:     Rate and Rhythm: Normal rate and regular rhythm.  Pulmonary:     Effort: Pulmonary effort is normal.     Breath sounds: Normal breath sounds. No wheezing or rales.  Neurological:     Mental Status: She is alert.     Lab Results  Component Value Date   WBC 9.4 04/20/2023   HGB 12.9 04/20/2023   HCT 39.1 04/20/2023   PLT 288 04/20/2023   GLUCOSE 118 (H) 04/20/2023   CHOL 178 01/18/2023   TRIG 179 (H) 01/18/2023   HDL 51 01/18/2023   LDLCALC 96 01/18/2023   ALT 18 02/14/2023   AST 18 02/14/2023   NA 140 04/20/2023   K 5.2 (H) 04/20/2023   CL 105 04/20/2023   CREATININE 0.68 04/20/2023   BUN 24 (H) 04/20/2023   CO2 24 04/20/2023   TSH 3.330 01/18/2023   INR 0.94 06/16/2018   HGBA1C 5.3 10/05/2019     Assessment & Plan:   Problem List Items Addressed This Visit       Respiratory   Rhinosinusitis - Primary   Treating with Augmentin .      Relevant Medications   amoxicillin -clavulanate (AUGMENTIN ) 875-125 MG tablet    Meds ordered this encounter  Medications   amoxicillin -clavulanate (AUGMENTIN ) 875-125 MG tablet    Sig: Take 1 tablet by mouth 2 (two) times daily.    Dispense:  14 tablet    Refill:  0    Follow-up:  Return if symptoms worsen or fail to improve.  Jacqulyn Ahle DO [facility]

## 2023-05-17 ENCOUNTER — Other Ambulatory Visit: Payer: PPO

## 2023-05-17 ENCOUNTER — Inpatient Hospital Stay: Payer: PPO

## 2023-05-17 ENCOUNTER — Inpatient Hospital Stay: Payer: PPO | Attending: Oncology | Admitting: Oncology

## 2023-05-17 VITALS — BP 112/65 | HR 79 | Temp 98.1°F | Resp 18 | Ht 62.0 in | Wt 130.4 lb

## 2023-05-17 DIAGNOSIS — Z8572 Personal history of non-Hodgkin lymphomas: Secondary | ICD-10-CM | POA: Insufficient documentation

## 2023-05-17 DIAGNOSIS — M8580 Other specified disorders of bone density and structure, unspecified site: Secondary | ICD-10-CM | POA: Diagnosis not present

## 2023-05-17 DIAGNOSIS — K589 Irritable bowel syndrome without diarrhea: Secondary | ICD-10-CM | POA: Diagnosis not present

## 2023-05-17 DIAGNOSIS — D351 Benign neoplasm of parathyroid gland: Secondary | ICD-10-CM | POA: Insufficient documentation

## 2023-05-17 DIAGNOSIS — C8203 Follicular lymphoma grade I, intra-abdominal lymph nodes: Secondary | ICD-10-CM

## 2023-05-17 DIAGNOSIS — Z860100 Personal history of colon polyps, unspecified: Secondary | ICD-10-CM | POA: Diagnosis not present

## 2023-05-17 DIAGNOSIS — Z9221 Personal history of antineoplastic chemotherapy: Secondary | ICD-10-CM | POA: Insufficient documentation

## 2023-05-17 LAB — CMP (CANCER CENTER ONLY)
ALT: 16 U/L (ref 0–44)
AST: 19 U/L (ref 15–41)
Albumin: 4.6 g/dL (ref 3.5–5.0)
Alkaline Phosphatase: 58 U/L (ref 38–126)
Anion gap: 6 (ref 5–15)
BUN: 24 mg/dL — ABNORMAL HIGH (ref 8–23)
CO2: 28 mmol/L (ref 22–32)
Calcium: 10.9 mg/dL — ABNORMAL HIGH (ref 8.9–10.3)
Chloride: 102 mmol/L (ref 98–111)
Creatinine: 1.19 mg/dL — ABNORMAL HIGH (ref 0.44–1.00)
GFR, Estimated: 46 mL/min — ABNORMAL LOW (ref >60)
Glucose, Bld: 141 mg/dL — ABNORMAL HIGH (ref 70–99)
Potassium: 4.8 mmol/L (ref 3.5–5.1)
Sodium: 136 mmol/L (ref 135–145)
Total Bilirubin: 0.3 mg/dL (ref 0.0–1.2)
Total Protein: 6.5 g/dL (ref 6.5–8.1)

## 2023-05-17 LAB — CBC WITH DIFFERENTIAL (CANCER CENTER ONLY)
Abs Immature Granulocytes: 0.08 10*3/uL — ABNORMAL HIGH (ref 0.00–0.07)
Basophils Absolute: 0.1 10*3/uL (ref 0.0–0.1)
Basophils Relative: 0 %
Eosinophils Absolute: 0.1 10*3/uL (ref 0.0–0.5)
Eosinophils Relative: 1 %
HCT: 41.6 % (ref 36.0–46.0)
Hemoglobin: 13.6 g/dL (ref 12.0–15.0)
Immature Granulocytes: 1 %
Lymphocytes Relative: 13 %
Lymphs Abs: 1.7 10*3/uL (ref 0.7–4.0)
MCH: 30.2 pg (ref 26.0–34.0)
MCHC: 32.7 g/dL (ref 30.0–36.0)
MCV: 92.2 fL (ref 80.0–100.0)
Monocytes Absolute: 0.8 10*3/uL (ref 0.1–1.0)
Monocytes Relative: 6 %
Neutro Abs: 10.7 10*3/uL — ABNORMAL HIGH (ref 1.7–7.7)
Neutrophils Relative %: 79 %
Platelet Count: 308 10*3/uL (ref 150–400)
RBC: 4.51 MIL/uL (ref 3.87–5.11)
RDW: 13.5 % (ref 11.5–15.5)
WBC Count: 13.4 10*3/uL — ABNORMAL HIGH (ref 4.0–10.5)
nRBC: 0 % (ref 0.0–0.2)

## 2023-05-17 NOTE — Progress Notes (Signed)
 [name] [name] OFFICE PROGRESS NOTE   Diagnosis: Non-Hodgkin's lymphoma  INTERVAL HISTORY:   [name] [name] returns as scheduled.  She feels well.  No fever or night sweats.  She no longer has right back pain.  She has been diagnosed with a parathyroid  adenoma and is scheduled for resection by Dr. Eletha.  Objective:  Vital signs in last 24 hours:  Blood pressure 112/65, pulse 79, temperature 98.1 F (36.7 C), temperature source Temporal, resp. rate 18, height 5' 2 (1.575 m), weight 130 lb 6.4 oz (59.1 kg), SpO2 100%.    Lymphatics: No cervical, supraclavicular, axillary, or inguinal nodes Resp: Lungs clear bilaterally Cardio: Regular rate and rhythm GI: No hepatosplenomegaly, no mass, nontender Vascular: No leg edema   Lab Results:  Lab Results  Component Value Date   WBC 13.4 (H) 05/17/2023   HGB 13.6 05/17/2023   HCT 41.6 05/17/2023   MCV 92.2 05/17/2023   PLT 308 05/17/2023   NEUTROABS 10.7 (H) 05/17/2023    CMP  Lab Results  Component Value Date   NA 136 05/17/2023   K 4.8 05/17/2023   CL 102 05/17/2023   CO2 28 05/17/2023   GLUCOSE 141 (H) 05/17/2023   BUN 24 (H) 05/17/2023   CREATININE 1.19 (H) 05/17/2023   CALCIUM  10.9 (H) 05/17/2023   PROT 6.5 05/17/2023   ALBUMIN 4.6 05/17/2023   AST 19 05/17/2023   ALT 16 05/17/2023   ALKPHOS 58 05/17/2023   BILITOT 0.3 05/17/2023   GFRNONAA 46 (L) 05/17/2023   GFRAA >60 01/17/2020    No results found for: CEA1, CEA, CAN199, CA125  Lab Results  Component Value Date   INR 0.94 06/16/2018   LABPROT 12.5 06/16/2018    Imaging:  No results found.  Medications: I have reviewed the patient's current medications.   Assessment/Plan: Non-Hodgkin's lymphoma-follicular lymphoma Mesenteric lymph node biopsy 07/15/1999- follicular [name] cell lymphoma, grade 2 Treated with mitoxantrone, fludarabine, and rituximab  completed in January 2002 Progressive disease in February 2004, treated with  single agent rituximab  February 2004 through August 2005 CTs 06/21/2014- no evidence of lymphoma CT abdomen/pelvis 06/05/2018- enlarging left mid abdominal mesenteric mass CT biopsy of the mesenteric mass 06/16/2018-follicular lymphoma, low-grade, CD20 positive Cycle 1 weekly Rituxan  07/24/2018 Cycle 2 weekly Rituxan  07/31/2018  Cycle 3 weekly Rituxan  08/07/2018 Cycle 4 weekly Rituxan  08/14/2018 CT abdomen/pelvis 10/09/2018- reduction in size of left lower quadrant mesenteric mass Cycle 1 maintenance rituximab  10/19/2018 Cycle 2 maintenance Rituxan  12/14/2018 Cycle 3 maintenance Rituxan  02/08/2019 Cycle 4 maintenance Rituxan  04/09/2019 CT abdomen/pelvis 05/31/2019-no change in jejunal mesenteric mass, no evidence of disease progression. Cycle 5 maintenance rituximab  06/07/2019 Cycle 6 maintenance rituximab  08/02/2019 Cycle 7 maintenance rituximab  09/27/2019 CT abdomen/pelvis 11/20/2019-further contraction of the lesion at the small bowel mesentery, no evidence of progressive lymphoma Cycle 8 maintenance rituximab  11/22/2019 Cycle 9 maintenance rituximab  01/17/2020 Cycle 10 maintenance rituximab  03/13/2020 Cycle 11 maintenance rituximab  05/08/2020 Cycle 12 maintenance rituximab  07/03/2020 Cycle 13 maintenance rituximab  08/28/2020 CTs 10/28/2020-unchanged soft tissue in the left abdominal small bowel mesentery, no evidence of lymphadenopathy or recurrent disease elsewhere in the chest, abdomen, or pelvis CTs 08/10/2022-stable posttreatment appearance of soft tissue in the left small bowel mesentery, no evidence of recurrent lymphoma Irritable bowel syndrome History of anal incontinence Osteopenia History of colon polyps Weakness, flushing, presyncope and chills during Rituxan  infusion 07/24/2018. Mild hypercalcemia-chronic, diagnosed with mild primary hyperparathyroidism-followed by endocrinology Right parathyroid  adenoma diagnosed on ultrasound/clear medicine scan 03/29/2023 COVID-19 04/10/2021 Zoster rash, right  posterior lateral chest wall  04/30/2021     Disposition: [name] [name] remains in clinical remission from non-Hodgkin's lymphoma.  She is scheduled for removal of a right parathyroid  adenoma by Dr. Eletha.  She will return for an office visit in 6 months.  Arley Hof, MD  05/17/2023  3:03 PM

## 2023-05-23 ENCOUNTER — Encounter (HOSPITAL_COMMUNITY): Payer: Self-pay

## 2023-05-23 ENCOUNTER — Other Ambulatory Visit: Payer: Self-pay

## 2023-05-23 ENCOUNTER — Encounter (HOSPITAL_COMMUNITY)
Admission: RE | Admit: 2023-05-23 | Discharge: 2023-05-23 | Disposition: A | Discharge status: Home / Self Care | Payer: PPO | Source: Ambulatory Visit | Attending: Surgery | Admitting: Surgery

## 2023-05-23 HISTORY — DX: Nausea with vomiting, unspecified: Z98.890

## 2023-05-23 HISTORY — DX: Other specified postprocedural states: R11.2

## 2023-05-23 HISTORY — DX: Other complications of anesthesia, initial encounter: T88.59XA

## 2023-05-23 NOTE — Progress Notes (Signed)
For Anesthesia: PCP - Everlene Other, DO  Cardiologist -  Kristeen Miss, MD   Bowel Prep reminder:  Chest x-ray - CT chest 08-10-22 Epic  EKG - 07-22-22 Epic  Stress Test - 10-12-10 Epic  ECHO - 12-04-20 Epic  Cardiac Cath - 2015 Pacemaker/ICD device last checked: Pacemaker orders received: Device Rep notified:  Spinal Cord Stimulator:N/A   Sleep Study - N/A  CPAP -   Fasting Blood Sugar - N/A  Checks Blood Sugar _____ times a day Date and result of last Hgb A1c-  Last dose of GLP1 agonist- N/A  GLP1 instructions:   Last dose of SGLT-2 inhibitors- N/A  SGLT-2 instructions:   Blood Thinner Instructions:N/A  Aspirin Instructions: Last Dose:  Activity level: Can go up a flight of stairs and activities of daily living without stopping and without chest pain and/or shortness of breath   Able to exercise without chest pain and/or shortness of breath  Anesthesia review: MVP, NSVT, HTN   Patient denies shortness of breath, fever, cough and chest pain at PAT appointment   Patient verbalized understanding of instructions that were given to them at the PAT appointment. Patient was also instructed that they will need to review over the PAT instructions again at home before surgery.

## 2023-05-29 ENCOUNTER — Encounter (HOSPITAL_COMMUNITY): Payer: Self-pay | Admitting: Surgery

## 2023-05-29 NOTE — H&P (Signed)
REFERRING PHYSICIAN: Tommie Sams, DO  PROVIDER: Macyn Remmert Myra Rude, MD   Chief Complaint: New Consultation (Primary hyperparathyroidism)  History of Present Illness:  Patient is referred by Dr. Everlene Other for surgical evaluation and management of suspected primary hyperparathyroidism. Patient states that she has had elevated serum calcium levels for over 20 years. She denies any specific symptoms but does note some fatigue and does have a history of osteoporosis. There is no history of nephrolithiasis. She denies any significant bone or joint pain. He denies urinary frequency. Recent laboratory studies showed an elevated serum calcium level as high as 11.2 and an elevated intact PTH level as high as 133. Vitamin D level is normal at 37.1. Patient has seen Dr. Purcell Nails in [place] in the past. She has done a 24-hour urine collection for calcium. She has not had any imaging studies. Patient has had no prior surgery on the head or neck. She presents today to discuss further evaluation and management.  Review of Systems: A complete review of systems was obtained from the patient. I have reviewed this information and discussed as appropriate with the patient. See HPI as well for other ROS.  Review of Systems  Constitutional: Positive for malaise/fatigue.  HENT: Negative.  Eyes: Negative.  Respiratory: Negative.  Cardiovascular: Negative.  Gastrointestinal: Negative.  Genitourinary: Negative.  Musculoskeletal:  Osteoporosis  Skin: Negative.  Neurological: Negative.  Endo/Heme/Allergies: Negative.  Psychiatric/Behavioral: Negative.    Medical History: Past Medical History:  Diagnosis Date  Acid reflux disease  Bilateral ocular hypertension  Congestive heart disease (CMS/HHS-HCC)  Dry eye syndrome  Glaucoma suspect  Hodgkin's disease (CMS/HHS-HCC)  h/o non-hodgkins lymphoma  Hypertension  Macular cyst, hole, or pseudohole of retina, left eye  Myopia   Presbyopia  Vitreomacular adhesion  Vitreous degeneration S/p Jetrea injection (09/2011)   Patient Active Problem List  Diagnosis  Myopia  Presbyopia  Glaucoma suspect  Dry eye syndrome  Bilateral ocular hypertension  Macular cyst, hole, or pseudohole of retina, left eye  Vitreomacular adhesion  Primary hyperparathyroidism (CMS/HHS-HCC)   History reviewed. No pertinent surgical history.   Allergies  Allergen Reactions  Sulfa (Sulfonamide Antibiotics) Anaphylaxis  And face swelling  Iodine Hives  When she has a CAT scan each year to check lymphoma they give her 50 mg of Benadryl before injecting contrast dye  Latex Other (See Comments)  Blisters  Robinul [Glycopyrrolate] Other (See Comments)  Could not urinate after it was given   Current Outpatient Medications on File Prior to Visit  Medication Sig Dispense Refill  aspirin 81 MG EC tablet Take 81 mg by mouth daily.  ATENOLOL ORAL Take by mouth.  carboxymethylcellulose (REFRESH PLUS) 0.5 % Dpet 3 (three) times daily as needed.  ERGOCALCIFEROL, VITAMIN D2, (VITAMIN D ORAL) Take by mouth.  ESOMEPRAZOLE MAGNESIUM (NEXIUM ORAL) Take by mouth.  MULTIVITAMIN W-MINERALS/LUTEIN (CENTRUM SILVER ORAL) Take by mouth.  OMEGA-3/DHA/EPA/FISH OIL (FISH OIL-OMEGA-3 FATTY ACIDS) 300-1,000 mg capsule Take 2 g by mouth daily.   No current facility-administered medications on file prior to visit.   Family History  Problem Relation Age of Onset  Glaucoma Maternal Aunt    Social History   Tobacco Use  Smoking Status Never  Smokeless Tobacco Never    Social History   Socioeconomic History  Marital status: Widowed  Tobacco Use  Smoking status: Never  Smokeless tobacco: Never  Substance and Sexual Activity  Alcohol use: No  Drug use: No   Social Drivers of Dispensing optician  Resource Strain: Low Risk (03/03/2023)  Received [facility]  Overall Financial Resource Strain (CARDIA)  Difficulty of Paying Living Expenses:  Not hard at all  Food Insecurity: No Food Insecurity (03/03/2023)  Received from [facility]  Hunger Vital Sign  Worried About Running Out of Food in the Last Year: Never true  Ran Out of Food in the Last Year: Never true  Transportation Needs: No Transportation Needs (03/03/2023)  Received from [facility] - Transportation  Lack of Transportation (Medical): No  Lack of Transportation (Non-Medical): No  Physical Activity: Sufficiently Active (03/03/2023)  [facility] from [facility]  Exercise Vital Sign  Days of Exercise per Week: 5 days  Minutes of Exercise per Session: 60 min  Stress: No Stress Concern Present (03/03/2023)  Received from [facility] of Occupational Health - Occupational Stress Questionnaire  Feeling of Stress : Not at all  Social Connections: Moderately Integrated (03/03/2023)  Received from [facility]  Social Connection and Isolation Panel [NHANES]  Frequency of Communication with Friends and Family: More than three times a week  Frequency of Social Gatherings with Friends and Family: More than three times a week  Attends Religious Services: More than 4 times per year  Active Member of [employer] or Organizations: Yes  Attends Banker Meetings: 1 to 4 times per year  Marital Status: Widowed  Recent Concern: Social Connections - Moderately Isolated (01/17/2023)  Received from [facility]  Social Connection and Isolation Panel [NHANES]  Frequency of Communication with Friends and Family: More than three times a week  Frequency of Social Gatherings with Friends and Family: Three times a week  Attends Religious Services: More than 4 times per year  Active Member of Clubs or Organizations: No  Marital Status: Widowed   Objective:   There were no vitals filed for this visit.  There is no height or weight on file to calculate BMI.  Physical Exam   GENERAL APPEARANCE Comfortable, no acute issues Development: normal Gross  deformities: none  SKIN Rash, lesions, ulcers: none Induration, erythema: none Nodules: none palpable  EYES Conjunctiva and lids: normal Pupils: equal  EARS, NOSE, MOUTH, THROAT External ears: no lesion or deformity External nose: no lesion or deformity Hearing: grossly normal  NECK Symmetric: yes Trachea: midline Thyroid: no palpable nodules in the thyroid bed  CHEST/CV Not assessed  ABDOMEN Not assessed  GENITOURINARY/RECTAL Not assessed  MUSCULOSKELETAL Station and gait: normal Digits and nails: no clubbing or cyanosis Muscle strength: grossly normal all extremities Deformity: none  LYMPHATIC Cervical: none palpable Supraclavicular: none palpable  PSYCHIATRIC Oriented to person, place, and time: yes Mood and affect: normal for situation Judgment and insight: appropriate for situation   Assessment and Plan:   Primary hyperparathyroidism (CMS/HHS-HCC)  Patient is referred by her primary care physician for surgical evaluation and management of newly diagnosed primary hyperparathyroidism.  Patient provided with a copy of "Parathyroid Surgery: Treatment for Your Parathyroid Gland Problem", published by [employer], 12 pages. Book reviewed and explained to patient during visit today.  Today we reviewed her clinical history. We reviewed her recent laboratory studies. I would like to proceed with imaging studies to include an ultrasound examination of the neck as well as a nuclear medicine parathyroid scan with sestamibi. These 2 studies together all for approximately an 80% chance of successfully localizing an adenoma and confirming the diagnosis. If they fail to provide a definitive diagnosis, then I would consider proceeding with a 4D CT scan of  the neck with parathyroid protocol. We discussed this today.  If we can successfully localize a parathyroid adenoma, then I believe the patient will be a good candidate for minimally invasive outpatient parathyroid surgery. We  discussed the size and location of the surgical incision. We discussed the hospital stay to be anticipated. We discussed doing this as an outpatient surgery. We discussed potential complications including recurrent laryngeal nerve injury with resulting hoarseness. The patient understands and agrees to consider surgery once her diagnostic testing is completed.  Patient will undergo the above studies. We will contact her with the results and make plans for further management at that time.   Darnell Level, MD [facility] practice Office: [phone]

## 2023-05-30 ENCOUNTER — Ambulatory Visit (HOSPITAL_COMMUNITY): Payer: PPO | Admitting: Medical

## 2023-05-30 ENCOUNTER — Encounter (HOSPITAL_COMMUNITY): Payer: Self-pay | Admitting: Surgery

## 2023-05-30 ENCOUNTER — Ambulatory Visit (HOSPITAL_BASED_OUTPATIENT_CLINIC_OR_DEPARTMENT_OTHER): Payer: PPO | Admitting: Medical

## 2023-05-30 ENCOUNTER — Other Ambulatory Visit: Payer: Self-pay

## 2023-05-30 ENCOUNTER — Ambulatory Visit (HOSPITAL_COMMUNITY)
Admission: RE | Admit: 2023-05-30 | Discharge: 2023-05-30 | Disposition: A | Discharge status: Home / Self Care | Payer: PPO | Source: Ambulatory Visit | Attending: Surgery | Admitting: Surgery

## 2023-05-30 ENCOUNTER — Encounter (HOSPITAL_COMMUNITY)
Admission: RE | Disposition: A | Discharge status: Home / Self Care | Payer: Self-pay | Source: Ambulatory Visit | Attending: Surgery

## 2023-05-30 DIAGNOSIS — I509 Heart failure, unspecified: Secondary | ICD-10-CM | POA: Diagnosis not present

## 2023-05-30 DIAGNOSIS — E210 Primary hyperparathyroidism: Secondary | ICD-10-CM | POA: Diagnosis not present

## 2023-05-30 DIAGNOSIS — I110 Hypertensive heart disease with heart failure: Secondary | ICD-10-CM | POA: Insufficient documentation

## 2023-05-30 HISTORY — PX: PARATHYROIDECTOMY: SHX19

## 2023-05-30 SURGERY — PARATHYROIDECTOMY
Anesthesia: General | Site: Neck | Laterality: Right

## 2023-05-30 MED ORDER — ONDANSETRON HCL 4 MG/2ML IJ SOLN
INTRAMUSCULAR | Status: DC | PRN
  Administered 2023-05-30: 4 mg via INTRAVENOUS

## 2023-05-30 MED ORDER — DEXAMETHASONE SODIUM PHOSPHATE 10 MG/ML IJ SOLN
INTRAMUSCULAR | Status: AC
  Filled 2023-05-30: qty 1

## 2023-05-30 MED ORDER — BUPIVACAINE HCL 0.25 % IJ SOLN
INTRAMUSCULAR | Status: DC | PRN
  Administered 2023-05-30: 10 mL

## 2023-05-30 MED ORDER — OXYCODONE HCL 5 MG PO TABS
5.0000 mg | ORAL_TABLET | Freq: Once | ORAL | Status: DC | PRN
Start: 2023-05-30 — End: 2023-05-30

## 2023-05-30 MED ORDER — EPHEDRINE SULFATE (PRESSORS) 50 MG/ML IJ SOLN
INTRAMUSCULAR | Status: DC | PRN
  Administered 2023-05-30: 5 mg via INTRAVENOUS

## 2023-05-30 MED ORDER — FENTANYL CITRATE PF 50 MCG/ML IJ SOSY: 25.0000 ug | PREFILLED_SYRINGE | INTRAMUSCULAR | Status: DC | PRN

## 2023-05-30 MED ORDER — PROPOFOL 10 MG/ML IV BOLUS
INTRAVENOUS | Status: DC | PRN
  Administered 2023-05-30: 110 mg via INTRAVENOUS
  Administered 2023-05-30: 90 mg via INTRAVENOUS
  Administered 2023-05-30: 100 mg via INTRAVENOUS

## 2023-05-30 MED ORDER — CEFAZOLIN SODIUM-DEXTROSE 2-4 GM/100ML-% IV SOLN
2.0000 g | INTRAVENOUS | Status: AC
  Administered 2023-05-30: 2 g via INTRAVENOUS
  Filled 2023-05-30: qty 100

## 2023-05-30 MED ORDER — EPHEDRINE 5 MG/ML INJ
INTRAVENOUS | Status: AC
  Filled 2023-05-30: qty 5

## 2023-05-30 MED ORDER — SUGAMMADEX SODIUM 200 MG/2ML IV SOLN
INTRAVENOUS | Status: DC | PRN
  Administered 2023-05-30: 200 mg via INTRAVENOUS

## 2023-05-30 MED ORDER — ROCURONIUM BROMIDE 10 MG/ML (PF) SYRINGE
PREFILLED_SYRINGE | INTRAVENOUS | Status: AC
  Filled 2023-05-30: qty 10

## 2023-05-30 MED ORDER — PHENYLEPHRINE 80 MCG/ML (10ML) SYRINGE FOR IV PUSH (FOR BLOOD PRESSURE SUPPORT)
PREFILLED_SYRINGE | INTRAVENOUS | Status: AC
  Filled 2023-05-30: qty 10

## 2023-05-30 MED ORDER — 0.9 % SODIUM CHLORIDE (POUR BTL) OPTIME
TOPICAL | Status: DC | PRN
  Administered 2023-05-30: 1000 mL

## 2023-05-30 MED ORDER — DEXAMETHASONE SODIUM PHOSPHATE 10 MG/ML IJ SOLN
INTRAMUSCULAR | Status: DC | PRN
  Administered 2023-05-30: 8 mg via INTRAVENOUS

## 2023-05-30 MED ORDER — HEMOSTATIC AGENTS (NO CHARGE) OPTIME
TOPICAL | Status: DC | PRN
  Administered 2023-05-30: 1 via TOPICAL

## 2023-05-30 MED ORDER — PROPOFOL 500 MG/50ML IV EMUL
INTRAVENOUS | Status: DC | PRN
  Administered 2023-05-30: 120 ug/kg/min via INTRAVENOUS

## 2023-05-30 MED ORDER — LIDOCAINE HCL (CARDIAC) PF 100 MG/5ML IV SOSY
PREFILLED_SYRINGE | INTRAVENOUS | Status: DC | PRN
  Administered 2023-05-30: 100 mg via INTRAVENOUS

## 2023-05-30 MED ORDER — PROPOFOL 10 MG/ML IV BOLUS
INTRAVENOUS | Status: AC
  Filled 2023-05-30: qty 20

## 2023-05-30 MED ORDER — PHENYLEPHRINE HCL-NACL 20-0.9 MG/250ML-% IV SOLN
INTRAVENOUS | Status: DC | PRN
  Administered 2023-05-30: 30 ug/min via INTRAVENOUS

## 2023-05-30 MED ORDER — ROCURONIUM BROMIDE 100 MG/10ML IV SOLN
INTRAVENOUS | Status: DC | PRN
  Administered 2023-05-30: 60 mg via INTRAVENOUS

## 2023-05-30 MED ORDER — TRAMADOL HCL 50 MG PO TABS: 50.0000 mg | ORAL_TABLET | Freq: Four times a day (QID) | ORAL | 0 refills | Status: DC | PRN

## 2023-05-30 MED ORDER — DEXMEDETOMIDINE HCL IN NACL 80 MCG/20ML IV SOLN
INTRAVENOUS | Status: AC
  Filled 2023-05-30: qty 20

## 2023-05-30 MED ORDER — CHLORHEXIDINE GLUCONATE CLOTH 2 % EX PADS: 6.0000 | MEDICATED_PAD | Freq: Once | CUTANEOUS | Status: DC

## 2023-05-30 MED ORDER — ACETAMINOPHEN 10 MG/ML IV SOLN
INTRAVENOUS | Status: AC
  Filled 2023-05-30: qty 100

## 2023-05-30 MED ORDER — FENTANYL CITRATE (PF) 100 MCG/2ML IJ SOLN
INTRAMUSCULAR | Status: DC | PRN
  Administered 2023-05-30: 50 ug via INTRAVENOUS
  Administered 2023-05-30 (×2): 25 ug via INTRAVENOUS
  Administered 2023-05-30: 100 ug via INTRAVENOUS

## 2023-05-30 MED ORDER — PHENYLEPHRINE HCL (PRESSORS) 10 MG/ML IV SOLN
INTRAVENOUS | Status: AC
  Filled 2023-05-30: qty 1

## 2023-05-30 MED ORDER — CHLORHEXIDINE GLUCONATE 0.12 % MT SOLN
15.0000 mL | Freq: Once | OROMUCOSAL | Status: AC
  Administered 2023-05-30: 15 mL via OROMUCOSAL

## 2023-05-30 MED ORDER — LACTATED RINGERS IV SOLN: INTRAVENOUS | Status: DC

## 2023-05-30 MED ORDER — ORAL CARE MOUTH RINSE: 15.0000 mL | Freq: Once | OROMUCOSAL | Status: AC

## 2023-05-30 MED ORDER — BUPIVACAINE HCL 0.25 % IJ SOLN
INTRAMUSCULAR | Status: AC
  Filled 2023-05-30: qty 1

## 2023-05-30 MED ORDER — ONDANSETRON HCL 4 MG/2ML IJ SOLN
4.0000 mg | Freq: Once | INTRAMUSCULAR | Status: DC | PRN
Start: 2023-05-30 — End: 2023-05-30

## 2023-05-30 MED ORDER — ONDANSETRON HCL 4 MG/2ML IJ SOLN
INTRAMUSCULAR | Status: AC
  Filled 2023-05-30: qty 2

## 2023-05-30 MED ORDER — FENTANYL CITRATE (PF) 100 MCG/2ML IJ SOLN
INTRAMUSCULAR | Status: AC
  Filled 2023-05-30: qty 2

## 2023-05-30 MED ORDER — PHENYLEPHRINE HCL (PRESSORS) 10 MG/ML IV SOLN
INTRAVENOUS | Status: DC | PRN
  Administered 2023-05-30: 80 ug via INTRAVENOUS

## 2023-05-30 MED ORDER — OXYCODONE HCL 5 MG/5ML PO SOLN: 5.0000 mg | Freq: Once | ORAL | Status: DC | PRN

## 2023-05-30 MED ORDER — DEXMEDETOMIDINE HCL IN NACL 80 MCG/20ML IV SOLN
INTRAVENOUS | Status: DC | PRN
  Administered 2023-05-30 (×2): 4 ug via INTRAVENOUS

## 2023-05-30 MED ORDER — PROPOFOL 1000 MG/100ML IV EMUL
INTRAVENOUS | Status: AC
  Filled 2023-05-30: qty 100

## 2023-05-30 MED ORDER — LIDOCAINE HCL (PF) 2 % IJ SOLN
INTRAMUSCULAR | Status: AC
  Filled 2023-05-30: qty 5

## 2023-05-30 MED ORDER — ACETAMINOPHEN 10 MG/ML IV SOLN
1000.0000 mg | Freq: Once | INTRAVENOUS | Status: DC | PRN
  Administered 2023-05-30: 1000 mg via INTRAVENOUS

## 2023-05-30 SURGICAL SUPPLY — 29 items
ATTRACTOMAT 16X20 MAGNETIC DRP (DRAPES) ×1 IMPLANT
BAG COUNTER SPONGE SURGICOUNT (BAG) ×1 IMPLANT
BLADE SURG 15 STRL LF DISP TIS (BLADE) ×1 IMPLANT
CHLORAPREP W/TINT 26 (MISCELLANEOUS) ×1 IMPLANT
CLIP TI MEDIUM 6 (CLIP) ×2 IMPLANT
CLIP TI WIDE RED SMALL 6 (CLIP) ×2 IMPLANT
COVER SURGICAL LIGHT HANDLE (MISCELLANEOUS) ×1 IMPLANT
DERMABOND ADVANCED .7 DNX12 (GAUZE/BANDAGES/DRESSINGS) ×1 IMPLANT
DRAPE LAPAROTOMY T 98X78 PEDS (DRAPES) ×1 IMPLANT
DRAPE UTILITY XL STRL (DRAPES) ×1 IMPLANT
ELECT REM PT RETURN 15FT ADLT (MISCELLANEOUS) ×1 IMPLANT
GAUZE 4X4 16PLY ~~LOC~~+RFID DBL (SPONGE) ×1 IMPLANT
GLOVE SURG ORTHO 8.0 STRL STRW (GLOVE) ×1 IMPLANT
GOWN STRL REUS W/ TWL XL LVL3 (GOWN DISPOSABLE) ×3 IMPLANT
HEMOSTAT SURGICEL 2X4 FIBR (HEMOSTASIS) ×1 IMPLANT
ILLUMINATOR WAVEGUIDE N/F (MISCELLANEOUS) IMPLANT
KIT BASIN OR (CUSTOM PROCEDURE TRAY) ×1 IMPLANT
KIT TURNOVER KIT A (KITS) IMPLANT
NDL HYPO 22X1.5 SAFETY MO (MISCELLANEOUS) ×1 IMPLANT
NEEDLE HYPO 22X1.5 SAFETY MO (MISCELLANEOUS) ×1
PACK BASIC VI WITH GOWN DISP (CUSTOM PROCEDURE TRAY) ×1 IMPLANT
PENCIL SMOKE EVACUATOR (MISCELLANEOUS) ×1 IMPLANT
SHEARS HARMONIC 9CM CVD (BLADE) IMPLANT
SUT MNCRL AB 4-0 PS2 18 (SUTURE) ×1 IMPLANT
SUT VIC AB 3-0 SH 18 (SUTURE) ×1 IMPLANT
SYR BULB IRRIG 60ML STRL (SYRINGE) ×1 IMPLANT
SYR CONTROL 10ML LL (SYRINGE) ×1 IMPLANT
TOWEL OR 17X26 10 PK STRL BLUE (TOWEL DISPOSABLE) ×1 IMPLANT
TUBING CONNECTING 10 (TUBING) ×1 IMPLANT

## 2023-05-30 NOTE — Discharge Instructions (Addendum)
[facility] - Dr. Darnell Level  THYROID & PARATHYROID SURGERY:  POST-OP INSTRUCTIONS  Always review the instruction sheet provided by the hospital nurse at discharge.  A prescription for pain medication may be sent to your pharmacy at the time of discharge.  Take your pain medication as prescribed.  If narcotic pain medicine is not needed, then you may take acetaminophen (Tylenol) or ibuprofen (Advil) as needed for pain or soreness.  Take your normal home medications as prescribed unless otherwise directed.  If you need a refill on your pain medication, please contact the office during regular business hours.  Prescriptions will not be processed by the office after 5:00PM or on weekends.  Start with a light diet upon arrival home, such as soup and crackers or toast.  Be sure to drink plenty of fluids.  Resume your normal diet the day after surgery.  Most patients will experience some swelling and bruising on the chest and neck area.  Ice packs will help for the first 48 hours after arriving home.  Swelling and bruising will take several days to resolve.   It is common to experience some constipation after surgery.  Increasing fluid intake and taking a stool softener (Colace) will usually help to prevent this problem.  A mild laxative (Milk of Magnesia or Miralax) should be taken according to package directions if there has been no bowel movement after 48 hours.  Dermabond glue covers your incision. This seals the wound and you may shower at any time. The Dermabond will remain in place for about a week.  You may gradually remove the glue when it loosens around the edges.  If you need to loosen the Dermabond for removal, apply a layer of Vaseline to the wound for 15 minutes and then remove with a Kleenex. Your sutures are under the skin and will not show - they will dissolve on their own.  You may resume light daily activities beginning the day after discharge (such as self-care,  walking, climbing stairs), gradually increasing activities as tolerated. You may have sexual intercourse when it is comfortable. Refrain from any heavy lifting or straining until approved by your doctor. You may drive when you no longer are taking prescription pain medication, you can comfortably wear a seatbelt, and you can safely maneuver your car and apply the brakes.  You will see your doctor in the office for a follow-up appointment approximately three weeks after your surgery.  Make sure that you call for this appointment within a day or two after you arrive home to insure a convenient appointment time. Please have any requested laboratory tests performed a few days prior to your office visit so that the results will be available at your follow up appointment.  WHEN TO CALL THE [facility] OFFICE: -- Fever greater than 101.5 -- Inability to urinate -- Nausea and/or vomiting - persistent -- Extreme swelling or bruising -- Continued bleeding from incision -- Increased pain, redness, or drainage from the incision -- Difficulty swallowing or breathing -- Muscle cramping or spasms -- Numbness or tingling in hands or around lips  The clinic staff is available to answer your questions during regular business hours.  Please don't hesitate to call and ask to speak to one of the nurses if you have concerns.  [facility] OFFICE: [phone] (24 hours)  Please sign up for MyChart accounts. This will allow you to communicate directly with my nurse or myself without having to call the office. It will also allow you  to view your test results. You will need to enroll in MyChart for my office ([facility]) and for the hospital [facility]).  Darnell Level, MD [facility] practice

## 2023-05-30 NOTE — Interval H&P Note (Signed)
History and Physical Interval Note:  05/30/2023 7:02 AM  [name] [name]  has presented today for surgery, with the diagnosis of PRIMARY HYPERPARATHYROIDISM.  The various methods of treatment have been discussed with the patient and family. After consideration of risks, benefits and other options for treatment, the patient has consented to    Procedure(s): RIGHT INFERIOR PARATHYROIDECTOMY (Right) as a surgical intervention.    The patient's history has been reviewed, patient examined, no change in status, stable for surgery.  I have reviewed the patient's chart and labs.  Questions were answered to the patient's satisfaction.    Darnell Level, MD [facility] practice Office: [phone]   Darnell Level

## 2023-05-30 NOTE — Anesthesia Procedure Notes (Signed)
Procedure Name: Intubation Date/Time: 05/30/2023 7:33 AM  Performed by: Garth Bigness, CRNAPre-anesthesia Checklist: Patient identified, Emergency Drugs available, Suction available and Patient being monitored Patient Re-evaluated:Patient Re-evaluated prior to induction Oxygen Delivery Method: Circle system utilized Preoxygenation: Pre-oxygenation with 100% oxygen Induction Type: IV induction Ventilation: Mask ventilation without difficulty Laryngoscope Size: Mac and 3 Grade View: Grade II Tube type: Oral Tube size: 7.0 mm Number of attempts: 1 Airway Equipment and Method: Stylet Placement Confirmation: ETT inserted through vocal cords under direct vision, positive ETCO2 and breath sounds checked- equal and bilateral Secured at: 22 cm Tube secured with: Tape Dental Injury: Teeth and Oropharynx as per pre-operative assessment

## 2023-05-30 NOTE — Anesthesia Postprocedure Evaluation (Signed)
Anesthesia Post Note  Patient: [name] [name]  Procedure(s) Performed: RIGHT INFERIOR PARATHYROIDECTOMY (Right: Neck)     Patient location during evaluation: PACU Anesthesia Type: General Level of consciousness: awake and alert Pain management: pain level controlled Vital Signs Assessment: post-procedure vital signs reviewed and stable Respiratory status: spontaneous breathing, nonlabored ventilation, respiratory function stable and patient connected to nasal cannula oxygen Cardiovascular status: blood pressure returned to baseline and stable Postop Assessment: no apparent nausea or vomiting Anesthetic complications: no   No notable events documented.  Last Vitals:  Vitals:   05/30/23 0927 05/30/23 0941  BP:  (!) 171/69  Pulse:  69  Resp:  15  Temp: (!) 36.4 C   SpO2:  98%    Last Pain:  Vitals:   05/30/23 0941  TempSrc:   PainSc: 2                  Mariann Barter

## 2023-05-30 NOTE — Transfer of Care (Signed)
Immediate Anesthesia Transfer of Care Note  Patient: CHRISTY EHRSAM  Procedure(s) Performed: RIGHT INFERIOR PARATHYROIDECTOMY (Right: Neck)  Patient Location: PACU  Anesthesia Type:General  Level of Consciousness: awake and patient cooperative  Airway & Oxygen Therapy: Patient Spontanous Breathing and Patient connected to face mask oxygen  Post-op Assessment: Report given to RN and Post -op Vital signs reviewed and stable  Post vital signs: Reviewed and stable  Last Vitals:  Vitals Value Taken Time  BP 160/65 05/30/23 0845  Temp    Pulse 77 05/30/23 0848  Resp 21 05/30/23 0848  SpO2 100 % 05/30/23 0848  Vitals shown include unfiled device data.  Last Pain:  Vitals:   05/30/23 0635  TempSrc:   PainSc: 0-No pain      Patients Stated Pain Goal: 6 (05/30/23 4098)  Complications: No notable events documented.

## 2023-05-30 NOTE — Op Note (Signed)
OPERATIVE REPORT - PARATHYROIDECTOMY  Preoperative diagnosis: Primary hyperparathyroidism  Postop diagnosis: Same  Procedure: Right inferior minimally invasive parathyroidectomy  Surgeon:  Darnell Level, MD  Anesthesia: General endotracheal  Estimated blood loss: Minimal  Preparation: ChloraPrep  Indications: Patient is referred by Dr. Everlene Other for surgical evaluation and management of suspected primary hyperparathyroidism. Patient states that she has had elevated serum calcium levels for over 20 years. She denies any specific symptoms but does note some fatigue and does have a history of osteoporosis. There is no history of nephrolithiasis. She denies any significant bone or joint pain. He denies urinary frequency. Recent laboratory studies showed an elevated serum calcium level as high as 11.2 and an elevated intact PTH level as high as 133. Vitamin D level is normal at 37.1. Patient has seen Dr. Purcell Nails in [place] in the past. She has done a 24-hour urine collection for calcium. She has not had any imaging studies. Patient has had no prior surgery on the head or neck. She presents today to discuss further evaluation and management.   Procedure: The patient was prepared in the pre-operative holding area. The patient was brought to the operating room and placed in a supine position on the operating room table. Following administration of general anesthesia, the patient was positioned and then prepped and draped in the usual strict aseptic fashion. After ascertaining that an adequate level of anesthesia been achieved, a neck incision was made with a #15 blade. Dissection was carried through subcutaneous tissues and platysma. Hemostasis was obtained with the electrocautery. Skin flaps were developed circumferentially and a Weitlander retractor was placed for exposure.  Strap muscles were incised in the midline. Strap muscles were reflected laterally exposing the thyroid lobe. With  gentle blunt dissection the thyroid lobe was mobilized.  Dissection was carried inferiorly and posteriorly and an enlarged parathyroid gland was identified. It was gently mobilized. Vascular structures were divided between small ligaclips. Care was taken to avoid the recurrent laryngeal nerve. The parathyroid gland was completely excised. It was submitted to pathology where frozen section confirmed hypercellular parathyroid tissue consistent with adenoma.  Neck was irrigated with warm saline and good hemostasis was noted. Fibrillar was placed in the operative field. Strap muscles were approximated in the midline with interrupted 3-0 Vicryl sutures. Platysma was closed with interrupted 3-0 Vicryl sutures. Marcaine was infiltrated circumferentially. Skin was closed with a running 4-0 Monocryl subcuticular suture. Wound was washed and dried and Dermabond was applied. Patient was awakened from anesthesia and brought to the recovery room. The patient tolerated the procedure well.   Darnell Level, MD [facility] Surgery Office: [phone]

## 2023-05-31 ENCOUNTER — Encounter (HOSPITAL_COMMUNITY): Payer: Self-pay | Admitting: Surgery

## 2023-05-31 LAB — SURGICAL PATHOLOGY

## 2023-06-13 DIAGNOSIS — D2272 Melanocytic nevi of left lower limb, including hip: Secondary | ICD-10-CM | POA: Diagnosis not present

## 2023-06-13 DIAGNOSIS — D225 Melanocytic nevi of trunk: Secondary | ICD-10-CM | POA: Diagnosis not present

## 2023-06-13 DIAGNOSIS — L570 Actinic keratosis: Secondary | ICD-10-CM | POA: Diagnosis not present

## 2023-06-13 DIAGNOSIS — L578 Other skin changes due to chronic exposure to nonionizing radiation: Secondary | ICD-10-CM | POA: Diagnosis not present

## 2023-06-13 DIAGNOSIS — L821 Other seborrheic keratosis: Secondary | ICD-10-CM | POA: Diagnosis not present

## 2023-06-13 DIAGNOSIS — L814 Other melanin hyperpigmentation: Secondary | ICD-10-CM | POA: Diagnosis not present

## 2023-06-17 DIAGNOSIS — Z9089 Acquired absence of other organs: Secondary | ICD-10-CM | POA: Diagnosis not present

## 2023-06-17 DIAGNOSIS — Z9889 Other specified postprocedural states: Secondary | ICD-10-CM | POA: Diagnosis not present

## 2023-06-17 DIAGNOSIS — E210 Primary hyperparathyroidism: Secondary | ICD-10-CM | POA: Diagnosis not present

## 2023-06-18 ENCOUNTER — Other Ambulatory Visit: Payer: Self-pay | Admitting: Internal Medicine

## 2023-06-21 ENCOUNTER — Telehealth: Payer: Self-pay | Admitting: *Deleted

## 2023-06-21 ENCOUNTER — Telehealth: Payer: Self-pay

## 2023-06-21 ENCOUNTER — Telehealth: Payer: PPO | Admitting: Physician Assistant

## 2023-06-21 DIAGNOSIS — J329 Chronic sinusitis, unspecified: Secondary | ICD-10-CM

## 2023-06-21 NOTE — Telephone Encounter (Signed)
Patient notified and scheduled office 06/24/23 with Toni Amend PA

## 2023-06-21 NOTE — Telephone Encounter (Signed)
Provider sent message to clinical crew within his office.

## 2023-06-21 NOTE — Telephone Encounter (Signed)
Duplicate - see other message from today. °

## 2023-06-21 NOTE — Telephone Encounter (Signed)
Copied from CRM [id number]. Topic: Clinical - Medication Question >> Jun 21, 2023  2:42 PM Geroge Baseman wrote: Reason for CRM: Patient calling in again to see if dr will prescribe antibiotics without seeing her. I did advise the earlier message was sent when she called earlier today, and she should hear back. I also let her know that usually they will not prescribe antibiotics without seeing the patient first.

## 2023-06-21 NOTE — Progress Notes (Signed)
   Thank you for the details you included in the comment boxes. Those details are very helpful in determining the best course of treatment for you and help [place] to provide the best care.Because of recurring symptoms, we recommend that you schedule a Virtual Urgent Care video visit in order for the provider to better assess what is going on.  The provider will be able to give you a more accurate diagnosis and treatment plan if we can more freely discuss your symptoms and with the addition of a virtual examination.   If you change your visit to a video visit, we will bill your insurance (similar to an office visit) and you will not be charged for this e-Visit. You will be able to stay at home and speak with the first available [facility] advanced practice provider. The link to do a video visit is in the drop down Menu tab of your Welcome screen in MyChart.

## 2023-06-21 NOTE — Telephone Encounter (Unsigned)
Copied from CRM [id number]. Topic: Clinical - Medication Question >> Jun 21, 2023  9:01 AM Gery Pray wrote: Reason for CRM: Patient would like to be re prescribed amoxicillin for her reoccurring sinusitis. Patient had this about 4 to 5 weeks ago and patient stated that the amoxicillin worked well and would like another prescription for it. Patient can be contacted at [phone]

## 2023-06-22 DIAGNOSIS — R030 Elevated blood-pressure reading, without diagnosis of hypertension: Secondary | ICD-10-CM | POA: Diagnosis not present

## 2023-06-22 DIAGNOSIS — Z6824 Body mass index (BMI) 24.0-24.9, adult: Secondary | ICD-10-CM | POA: Diagnosis not present

## 2023-06-22 DIAGNOSIS — J101 Influenza due to other identified influenza virus with other respiratory manifestations: Secondary | ICD-10-CM | POA: Diagnosis not present

## 2023-06-24 ENCOUNTER — Ambulatory Visit: Payer: PPO | Admitting: Physician Assistant

## 2023-07-06 DIAGNOSIS — G245 Blepharospasm: Secondary | ICD-10-CM | POA: Diagnosis not present

## 2023-07-06 DIAGNOSIS — H469 Unspecified optic neuritis: Secondary | ICD-10-CM | POA: Diagnosis not present

## 2023-07-06 DIAGNOSIS — H268 Other specified cataract: Secondary | ICD-10-CM | POA: Diagnosis not present

## 2023-07-06 DIAGNOSIS — H2513 Age-related nuclear cataract, bilateral: Secondary | ICD-10-CM | POA: Diagnosis not present

## 2023-07-06 DIAGNOSIS — H35033 Hypertensive retinopathy, bilateral: Secondary | ICD-10-CM | POA: Diagnosis not present

## 2023-07-06 DIAGNOSIS — H35342 Macular cyst, hole, or pseudohole, left eye: Secondary | ICD-10-CM | POA: Diagnosis not present

## 2023-07-06 DIAGNOSIS — H43813 Vitreous degeneration, bilateral: Secondary | ICD-10-CM | POA: Diagnosis not present

## 2023-07-29 ENCOUNTER — Other Ambulatory Visit (HOSPITAL_COMMUNITY): Payer: Self-pay | Admitting: Family Medicine

## 2023-07-29 DIAGNOSIS — Z1231 Encounter for screening mammogram for malignant neoplasm of breast: Secondary | ICD-10-CM

## 2023-08-04 ENCOUNTER — Encounter: Payer: Self-pay | Admitting: Cardiovascular Disease

## 2023-08-04 NOTE — Progress Notes (Unsigned)
  Cardiology Office Note:  .   Date:  08/05/2023  ID:  [name] [name], DOB 06-25-1940, MRN [id number] PCP: [name] Sams, DO  [facility] Providers Cardiologist:  [name] [name], [name] [name]  History of Present Illness: .   [name] [name] is a 83 y.o. female with hx of palpitations  She was last seen by Dr. Shari [name]   Hx of non hodgknis  lymphoma [name] Bale, MD)  Hx of NSVT - stable on Atenolol   Has very brief episodes of palpitations at night   Walks 2 miles a day   She wants to see Dr. Anne [name] when I retire   No CP , no dyspnea .   Hs some  DOE with walking ( down to her shop to get the lawnmower )   August 05, 2023 [name] [name] is seen for follow up of her palpitations  Hx of NSVT , palpitations   Has occasional  palpitations  Associated with transient lightheadedness      ROS:   Studies Reviewed: .         Risk Assessment/Calculations:          Physical Exam:    Physical Exam: Blood pressure 138/70, pulse 64, height 5\' 2"  (1.575 m), weight 131 lb 12.8 oz (59.8 kg), SpO2 95%.       GEN:  Well nourished, well developed in no acute distress HEENT: Normal NECK: No JVD; No carotid bruits LYMPHATICS: No lymphadenopathy CARDIAC: RRR , no murmurs, rubs, gallops RESPIRATORY:  Clear to auscultation without rales, wheezing or rhonchi  ABDOMEN: Soft, non-tender, non-distended MUSCULOSKELETAL:  No edema; No deformity  SKIN: Warm and dry NEUROLOGIC:  Alert and oriented x 3   ASSESSMENT AND PLAN: .     Mild MR :    insignificant   2.  Hyperlipidemia ::  stable   3.  Supraventricular tachycardia:     she has rare episdoes of palpitations  We discussed having her wear an event monitor . She will lets [place] know if these worsen   No changes in medications        Dispo: Dr. Anne [name] in 1 year    Signed, [name] Miss, MD

## 2023-08-05 ENCOUNTER — Ambulatory Visit: Payer: PPO | Attending: Internal Medicine | Admitting: Cardiovascular Disease

## 2023-08-05 ENCOUNTER — Encounter: Payer: Self-pay | Admitting: Cardiovascular Disease

## 2023-08-05 VITALS — BP 138/70 | HR 64 | Ht 62.0 in | Wt 131.8 lb

## 2023-08-05 DIAGNOSIS — E782 Mixed hyperlipidemia: Secondary | ICD-10-CM | POA: Diagnosis not present

## 2023-08-05 DIAGNOSIS — I10 Essential (primary) hypertension: Secondary | ICD-10-CM | POA: Diagnosis not present

## 2023-08-05 MED ORDER — ROSUVASTATIN CALCIUM 5 MG PO TABS: ORAL_TABLET | ORAL | 3 refills | Status: AC

## 2023-08-05 NOTE — Patient Instructions (Signed)
 Medication Instructions:  REFILL Rosuvastatin *If you need a refill on your cardiac medications before your next appointment, please call your pharmacy*  Follow-Up: At [facility], you and your health needs are our priority.  As part of our continuing mission to provide you with exceptional heart care, our providers are all part of one team.  This team includes your primary Cardiologist (physician) and Advanced Practice Providers or APPs (Physician Assistants and Nurse Practitioners) who all work together to provide you with the care you need, when you need it.  Your next appointment:   1 year(s)  Provider:   Donato Schultz, MD    1st Floor: - Lobby - Registration  - Pharmacy  - Lab - Cafe  2nd Floor: - PV Lab - Diagnostic Testing (echo, CT, nuclear med)  3rd Floor: - Vacant  4th Floor: - TCTS (cardiothoracic surgery) - AFib Clinic - Structural Heart Clinic - Vascular Surgery  - Vascular Ultrasound  5th Floor: - HeartCare Cardiology (general and EP) - Clinical Pharmacy for coumadin, hypertension, lipid, weight-loss medications, and med management appointments    Valet parking services will be available as well.

## 2023-08-23 DIAGNOSIS — H35342 Macular cyst, hole, or pseudohole, left eye: Secondary | ICD-10-CM | POA: Diagnosis not present

## 2023-08-23 DIAGNOSIS — H469 Unspecified optic neuritis: Secondary | ICD-10-CM | POA: Diagnosis not present

## 2023-08-23 DIAGNOSIS — H2513 Age-related nuclear cataract, bilateral: Secondary | ICD-10-CM | POA: Diagnosis not present

## 2023-08-23 DIAGNOSIS — H268 Other specified cataract: Secondary | ICD-10-CM | POA: Diagnosis not present

## 2023-08-23 DIAGNOSIS — H43392 Other vitreous opacities, left eye: Secondary | ICD-10-CM | POA: Diagnosis not present

## 2023-08-23 DIAGNOSIS — H35033 Hypertensive retinopathy, bilateral: Secondary | ICD-10-CM | POA: Diagnosis not present

## 2023-08-23 DIAGNOSIS — Z79899 Other long term (current) drug therapy: Secondary | ICD-10-CM | POA: Diagnosis not present

## 2023-08-23 DIAGNOSIS — H43813 Vitreous degeneration, bilateral: Secondary | ICD-10-CM | POA: Diagnosis not present

## 2023-08-24 ENCOUNTER — Ambulatory Visit (INDEPENDENT_AMBULATORY_CARE_PROVIDER_SITE_OTHER): Admitting: Family Medicine

## 2023-08-24 VITALS — BP 112/70 | HR 72 | Temp 97.2°F | Wt 128.0 lb

## 2023-08-24 DIAGNOSIS — D72829 Elevated white blood cell count, unspecified: Secondary | ICD-10-CM | POA: Diagnosis not present

## 2023-08-24 DIAGNOSIS — K580 Irritable bowel syndrome with diarrhea: Secondary | ICD-10-CM

## 2023-08-24 DIAGNOSIS — M546 Pain in thoracic spine: Secondary | ICD-10-CM

## 2023-08-24 DIAGNOSIS — I10 Essential (primary) hypertension: Secondary | ICD-10-CM

## 2023-08-24 DIAGNOSIS — R7309 Other abnormal glucose: Secondary | ICD-10-CM | POA: Diagnosis not present

## 2023-08-24 DIAGNOSIS — R300 Dysuria: Secondary | ICD-10-CM

## 2023-08-24 LAB — POCT URINALYSIS DIP (CLINITEK)
Bilirubin, UA: NEGATIVE
Blood, UA: NEGATIVE
Glucose, UA: NEGATIVE mg/dL
Ketones, POC UA: NEGATIVE mg/dL
Leukocytes, UA: NEGATIVE
Nitrite, UA: NEGATIVE
POC PROTEIN,UA: NEGATIVE
Spec Grav, UA: 1.02 (ref 1.010–1.025)
Urobilinogen, UA: 0.2 U/dL
pH, UA: 6 (ref 5.0–8.0)

## 2023-08-24 NOTE — Patient Instructions (Signed)
 I am going to send in medication shortly (after discussing with GI).  Labs today.  Take care  Dr. Debrah Fan

## 2023-08-25 ENCOUNTER — Encounter: Payer: Self-pay | Admitting: Family Medicine

## 2023-08-25 DIAGNOSIS — M546 Pain in thoracic spine: Secondary | ICD-10-CM | POA: Insufficient documentation

## 2023-08-25 DIAGNOSIS — R482 Apraxia: Secondary | ICD-10-CM | POA: Diagnosis not present

## 2023-08-25 DIAGNOSIS — G245 Blepharospasm: Secondary | ICD-10-CM | POA: Diagnosis not present

## 2023-08-25 LAB — CMP14+EGFR
ALT: 14 IU/L (ref 0–32)
AST: 24 IU/L (ref 0–40)
Albumin: 4.8 g/dL — ABNORMAL HIGH (ref 3.7–4.7)
Alkaline Phosphatase: 59 IU/L (ref 44–121)
BUN/Creatinine Ratio: 19 (ref 12–28)
BUN: 20 mg/dL (ref 8–27)
Bilirubin Total: 0.3 mg/dL (ref 0.0–1.2)
CO2: 25 mmol/L (ref 20–29)
Calcium: 9.9 mg/dL (ref 8.7–10.3)
Chloride: 102 mmol/L (ref 96–106)
Creatinine, Ser: 1.05 mg/dL — ABNORMAL HIGH (ref 0.57–1.00)
Globulin, Total: 1.6 g/dL (ref 1.5–4.5)
Glucose: 93 mg/dL (ref 70–99)
Potassium: 5.4 mmol/L — ABNORMAL HIGH (ref 3.5–5.2)
Sodium: 141 mmol/L (ref 134–144)
Total Protein: 6.4 g/dL (ref 6.0–8.5)
eGFR: 53 mL/min/{1.73_m2} — ABNORMAL LOW (ref >59)

## 2023-08-25 LAB — CBC
Hematocrit: 42.6 % (ref 34.0–46.6)
Hemoglobin: 14 g/dL (ref 11.1–15.9)
MCH: 30.5 pg (ref 26.6–33.0)
MCHC: 32.9 g/dL (ref 31.5–35.7)
MCV: 93 fL (ref 79–97)
Platelets: 293 10*3/uL (ref 150–450)
RBC: 4.59 x10E6/uL (ref 3.77–5.28)
RDW: 12.5 % (ref 11.7–15.4)
WBC: 9.5 10*3/uL (ref 3.4–10.8)

## 2023-08-25 LAB — HEMOGLOBIN A1C
Est. average glucose Bld gHb Est-mCnc: 123 mg/dL
Hgb A1c MFr Bld: 5.9 % — ABNORMAL HIGH (ref 4.8–5.6)

## 2023-08-25 MED ORDER — DICYCLOMINE HCL 20 MG PO TABS: 20.0000 mg | ORAL_TABLET | Freq: Four times a day (QID) | ORAL | 1 refills | Status: AC | PRN

## 2023-08-25 NOTE — Progress Notes (Signed)
 Subjective:  Patient ID: [name] [name], female    DOB: Mar 10, 1941  Age: 83 y.o. MRN: [id number]  CC:   Chief Complaint  Patient presents with   Diarrhea    Consistency seems to have changed going on 2 weeks feeling weak and shaky , noticed is worse this time and is interfering with lifestyle. Sees GI in June-    left flank pain     Patient unsure if its her kidneys or lung    HPI:  83 year old female with a history of IBS presents for evaluation of the above.  Patient reports that she has had ongoing diarrhea for the past 2 weeks.  Occurs predominantly in the morning.  She states that she feels "weak and shaky" when she is having significant diarrhea.  She has a history of IBS-D.  Follows with GI but cannot be seen as quickly as she would like.  She reports that her diarrhea has recently worsened as of Monday.  She reports more gas as well.  She also reports associated abdominal cramping.  She uses Imodium without significant improvement.  Additionally, patient reports left-sided thoracic back pain.  Patient Active Problem List   Diagnosis Date Noted   Thoracic back pain 08/25/2023   Abnormal renal ultrasound 03/07/2023   Hyperparathyroidism, primary (HCC) 12/17/2021   Age-related osteoporosis without current pathological fracture 12/17/2021   Peripheral neuropathy 08/26/2016   Dyssynergic defecation 12/31/2015   HTN (hypertension) 04/18/2015   NSVT (nonsustained ventricular tachycardia) (HCC) 11/27/2013   Allergic rhinitis 08/20/2013   Hyperlipidemia 08/27/2008   MVP (mitral valve prolapse) 08/27/2008    IBS- diarrhea predominant 10/13/2007   NHL (non-Hodgkin's lymphoma) (HCC) 06/16/2007    Social Hx   Social History   Socioeconomic History   Marital status: Widowed    Spouse name: Not on file   Number of children: 1   Years of education: Not on file   Highest education level: 12th grade  Occupational History   Occupation: Warehouse manager work part time    Associate Professor:  RETIRED  Tobacco Use   Smoking status: Former    Current packs/day: 0.33    Types: Cigarettes    Passive exposure: Never   Smokeless tobacco: Never   Tobacco comments:    quit 1976  Vaping Use   Vaping status: Never Used  Substance and Sexual Activity   Alcohol use: Not Currently    Comment: very rare   Drug use: No   Sexual activity: Yes    Birth control/protection: Surgical  Other Topics Concern   Not on file  Social History Narrative   Retired, widowed, 1 child   Rare EtOH, former smoker none now   No drugs   Right handed   Social Drivers of Corporate investment banker Strain: Low Risk  (08/24/2023)   Overall Financial Resource Strain (CARDIA)    Difficulty of Paying Living Expenses: Not hard at all  Food Insecurity: No Food Insecurity (08/24/2023)   Hunger Vital Sign    Worried About Running Out of Food in the Last Year: Never true    Ran Out of Food in the Last Year: Never true  Transportation Needs: No Transportation Needs (08/24/2023)   PRAPARE - Administrator, Civil Service (Medical): No    Lack of Transportation (Non-Medical): No  Physical Activity: Sufficiently Active (08/24/2023)   Exercise Vital Sign    Days of Exercise per Week: 5 days    Minutes of Exercise per Session: 60 min  Stress: No Stress Concern Present (08/24/2023)   [employer] of Occupational Health - Occupational Stress Questionnaire    Feeling of Stress : Not at all  Social Connections: Moderately Integrated (08/24/2023)   Social Connection and Isolation Panel [NHANES]    Frequency of Communication with Friends and Family: More than three times a week    Frequency of Social Gatherings with Friends and Family: Three times a week    Attends Religious Services: More than 4 times per year    Active Member of Clubs or Organizations: No    Attends Banker Meetings: 1 to 4 times per year    Marital Status: Widowed    Review of Systems Per HPI  Objective:  BP  112/70   Pulse 72   Temp (!) 97.2 F (36.2 C)   Wt 128 lb (58.1 kg)   SpO2 99%   BMI 23.41 kg/m      08/24/2023   11:18 AM 08/05/2023    2:16 PM 08/05/2023    1:41 PM  BP/Weight  Systolic BP 112 138 144  Diastolic BP 70 70 70  Wt. (Lbs) 128  131.8  BMI 23.41 kg/m2  24.11 kg/m2    Physical Exam Vitals and nursing note reviewed.  Constitutional:      General: She is not in acute distress.    Appearance: Normal appearance.  Cardiovascular:     Rate and Rhythm: Normal rate and regular rhythm.  Pulmonary:     Effort: Pulmonary effort is normal.     Breath sounds: Normal breath sounds.  Abdominal:     General: There is no distension.     Palpations: Abdomen is soft.     Tenderness: There is no abdominal tenderness.  Neurological:     Mental Status: She is alert.     Lab Results  Component Value Date   WBC 9.5 08/24/2023   HGB 14.0 08/24/2023   HCT 42.6 08/24/2023   PLT 293 08/24/2023   GLUCOSE 93 08/24/2023   CHOL 178 01/18/2023   TRIG 179 (H) 01/18/2023   HDL 51 01/18/2023   LDLCALC 96 01/18/2023   ALT 14 08/24/2023   AST 24 08/24/2023   NA 141 08/24/2023   K 5.4 (H) 08/24/2023   CL 102 08/24/2023   CREATININE 1.05 (H) 08/24/2023   BUN 20 08/24/2023   CO2 25 08/24/2023   TSH 3.330 01/18/2023   INR 0.94 06/16/2018   HGBA1C 5.9 (H) 08/24/2023     Assessment & Plan:  Leukocytosis, unspecified type -     CBC  Primary hypertension -     CMP14+EGFR  Elevated glucose -     Hemoglobin A1c  Irritable bowel syndrome with diarrhea Assessment & Plan: I believe that the patient is experiencing issues with IBS.  I reached out to gastroenterologist Dr. Mordechai April.  May need colonoscopy as could be experiencing microscopic colitis.  I am having him evaluate this patient.  Dicyclomine  sent in.  Orders: -     POCT URINALYSIS DIP (CLINITEK)  Left-sided thoracic back pain, unspecified chronicity Assessment & Plan: MSK in nature.  Heat as needed.  Supportive  care.   Other orders -     Dicyclomine  HCl; Take 1 tablet (20 mg total) by mouth every 6 (six) hours as needed (IBS).  Dispense: 60 tablet; Refill: 1    Aowyn Rozeboom DO [facility]

## 2023-08-25 NOTE — Assessment & Plan Note (Signed)
 MSK in nature.  Heat as needed.  Supportive care.

## 2023-08-25 NOTE — Assessment & Plan Note (Addendum)
 I believe that the patient is experiencing issues with IBS.  I reached out to gastroenterologist Dr. Mordechai April.  May need colonoscopy as could be experiencing microscopic colitis.  I am having him evaluate this patient.  Dicyclomine  sent in.

## 2023-08-31 ENCOUNTER — Other Ambulatory Visit: Payer: Self-pay | Admitting: Family Medicine

## 2023-08-31 MED ORDER — DIPHENOXYLATE-ATROPINE 2.5-0.025 MG PO TABS: 1.0000 | ORAL_TABLET | Freq: Four times a day (QID) | ORAL | 0 refills | Status: AC | PRN

## 2023-09-05 ENCOUNTER — Ambulatory Visit: Payer: PPO | Admitting: Family Medicine

## 2023-09-10 DIAGNOSIS — H469 Unspecified optic neuritis: Secondary | ICD-10-CM | POA: Diagnosis not present

## 2023-09-12 ENCOUNTER — Encounter (HOSPITAL_COMMUNITY): Payer: Self-pay

## 2023-09-12 ENCOUNTER — Ambulatory Visit (HOSPITAL_COMMUNITY)
Admission: RE | Admit: 2023-09-12 | Discharge: 2023-09-12 | Disposition: A | Discharge status: Home / Self Care | Source: Ambulatory Visit | Attending: Family Medicine | Admitting: Family Medicine

## 2023-09-12 DIAGNOSIS — Z1231 Encounter for screening mammogram for malignant neoplasm of breast: Secondary | ICD-10-CM | POA: Insufficient documentation

## 2023-09-27 DIAGNOSIS — G245 Blepharospasm: Secondary | ICD-10-CM | POA: Diagnosis not present

## 2023-09-27 DIAGNOSIS — R482 Apraxia: Secondary | ICD-10-CM | POA: Diagnosis not present

## 2023-09-27 DIAGNOSIS — Z9229 Personal history of other drug therapy: Secondary | ICD-10-CM | POA: Diagnosis not present

## 2023-10-05 ENCOUNTER — Encounter: Payer: Self-pay | Admitting: Internal Medicine

## 2023-10-05 ENCOUNTER — Ambulatory Visit: Admitting: Internal Medicine

## 2023-10-05 VITALS — BP 110/64 | HR 73 | Ht 62.0 in | Wt 130.4 lb

## 2023-10-05 DIAGNOSIS — N816 Rectocele: Secondary | ICD-10-CM | POA: Diagnosis not present

## 2023-10-05 DIAGNOSIS — K582 Mixed irritable bowel syndrome: Secondary | ICD-10-CM

## 2023-10-05 NOTE — Patient Instructions (Signed)
 _______________________________________________________  If your blood pressure at your visit was 140/90 or greater, please contact your primary care physician to follow up on this.  _______________________________________________________  If you are age 83 or older, your body mass index should be between 23-30. Your Body mass index is 23.86 kg/m. If this is out of the aforementioned range listed, please consider follow up with your Primary Care Provider.  If you are age 62 or younger, your body mass index should be between 19-25. Your Body mass index is 23.86 kg/m. If this is out of the aformentioned range listed, please consider follow up with your Primary Care Provider.   ________________________________________________________  The [facility] providers would like to encourage you to use MYCHART to communicate with providers for non-urgent requests or questions.  Due to long hold times on the telephone, sending your provider a message by [facility] may be a faster and more efficient way to get a response.  Please allow 48 business hours for a response.  Please remember that this is for non-urgent requests.  _______________________________________________________   [name] [name] have been given a testing kit to check for small intestine bacterial overgrowth (SIBO) which is completed by a company named [facility]. Make sure to return your test in the mail using the return mailing label given to you along with the kit. The test order, your demographic and insurance information have all already been sent to the company. [facility] will collect an upfront charge of $99.74 for commercial insurance plans and $209.74 if you are paying cash. Make sure to discuss with [facility] PRIOR to having the test to see if they have gotten information from your insurance company as to how much your testing will cost out of pocket, if any. Please contact [facility] at phone number [phone] to get  instructions regarding how to perform the test as our office is unable to give specific testing instructions.   I appreciate the opportunity to care for you. Loy Ruff, MD, [facility]

## 2023-10-05 NOTE — Progress Notes (Signed)
 [name] [name] 82 y.o. 1941/01/18 [id number]  Assessment & Plan:   Encounter Diagnoses  Name Primary?   Irritable bowel syndrome with both constipation and diarrhea Yes   Rectocele    She will stay off the Benefiber at this time given the improvement in diarrhea.    We have decided to pursue a lactulose hydrogen breath test to see if she has small intestinal bacterial overgrowth.    Continue home exercises for pelvic floor, consider repeat visit to PT.  CC: Myrna Ast, DO   Subjective:   Chief Complaint: IBS  HPI 83 year old woman with a history of GERD with stricture, IBS, family history of colon cancer and pelvic floor issues plus rectocele here today because of IBS problems.  She has chronic IBS-D issues but it was episodic.  However in the recent months prior to this visit she had a 3-week episode where she was having numerous stools every morning that was sort of orangeish, start out formed and gradually get looser.  It was very disruptive to her morning routine of walking at the gym.  4 or 5 stools in the morning.  She had been on Benefiber to regulate things over the past several years and decided to stop that and since then she has been bloated and somewhat gassy and having small balls of stool.  They are sometimes hard.  She is defecating most days and this is an improvement in her overall quality of life though it is a problem . she sometimes gets a twinge of left lower quadrant pain as well.  She does have a history of going to pelvic floor physical therapy which has helped and she tries to do her exercise some but is not diligent about that.  Colonoscopy 2019 diminutive adenoma and diverticulosis no recall due to age  CT chest abdomen pelvis 08/11/2022 for follow-up of lymphoma, stable posttreatment appearance of soft tissue stranding in the left small bowel mesentery no recurrent lymphoma  Lab Results  Component Value Date   WBC 9.5 08/24/2023   HGB 14.0  08/24/2023   HCT 42.6 08/24/2023   MCV 93 08/24/2023   PLT 293 08/24/2023   Lab Results  Component Value Date   NA 141 08/24/2023   CL 102 08/24/2023   K 5.4 (H) 08/24/2023   CO2 25 08/24/2023   BUN 20 08/24/2023   CREATININE 1.05 (H) 08/24/2023   EGFR 53 (L) 08/24/2023   CALCIUM  9.9 08/24/2023   PHOS 3.2 04/15/2022   ALBUMIN 4.8 (H) 08/24/2023   GLUCOSE 93 08/24/2023   Lab Results  Component Value Date   ALT 14 08/24/2023   AST 24 08/24/2023   ALKPHOS 59 08/24/2023   BILITOT 0.3 08/24/2023    Allergies  Allergen Reactions   Other Anaphylaxis    Other reaction(s): Facial swelling And face swelling And face swelling    Sulfa Antibiotics Swelling and Anaphylaxis    And face swelling Face swelling    Sulfonamide Derivatives Swelling    Face swelling    Glycopyrrolate Other (See Comments)    Unable to urinate Could not urinate after it was given   Latex Other (See Comments)    Blisters   Methscopolamine Other (See Comments)    Unable to urinate Unable to urinate   Ace Inhibitors Hives   Codeine Nausea Only   Iodine Hives   Iohexol  Hives     Desc: 50 mg benadryl  prior to exam    Current Meds  Medication Sig  atenolol  (TENORMIN ) 25 MG tablet TAKE 1 & 1/2 (ONE & ONE-HALF) TABLETS BY MOUTH ONCE DAILY   BOTOX 100 units SOLR injection Inject 100 Units into the muscle.   dicyclomine  (BENTYL ) 20 MG tablet Take 1 tablet (20 mg total) by mouth every 6 (six) hours as needed (IBS).   fexofenadine (ALLEGRA) 180 MG tablet Take 180 mg by mouth daily as needed for allergies.   meclizine  (ANTIVERT ) 25 MG tablet Take 1 tablet (25 mg total) by mouth 3 (three) times daily as needed for dizziness.   Multiple Vitamins-Minerals (MULTIVITAMIN GUMMIES ADULT) CHEW Chew 1 tablet by mouth daily.   pantoprazole  (PROTONIX ) 40 MG tablet TAKE 1 TABLET BY MOUTH ONCE DAILY BEFORE BREAKFAST   Polyvinyl Alcohol-Povidone PF 1.4-0.6 % SOLN Place 1 drop into both eyes 3 (three) times daily.    predniSONE  (DELTASONE ) 5 MG tablet Take 5 mg by mouth daily.   Probiotic Product (ALIGN PO) Take 1 capsule by mouth daily.   rosuvastatin  (CRESTOR ) 5 MG tablet Take 1 tablet by mouth on Mon, Wed and Fri only.   [DISCONTINUED] Wheat Dextrin (BENEFIBER) POWD Take 1 Scoop by mouth daily.   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   Manometry 12/2015   Arthritis    Brady-tachy syndrome (HCC)    Chest pain 10/16/2013   normal coronary arteries on cath and normal EF   Colitis, ischemic (HCC)    Colon polyp 07/29/1993   with focal adenomatous changes   Complication of anesthesia    Diverticulitis    Diverticulosis of colon    Dyspepsia    Dyssynergic defecation 12/31/2015   Anorectal mano 12/2015, also has rectal hypersensitivity   Fatty liver    GERD (gastroesophageal reflux disease)    Heart murmur    HLD (hyperlipidemia)    Hyperparathyroidism (HCC)    Hypertension    IBS (irritable bowel syndrome)    Incontinence, feces    MVP (mitral valve prolapse)    nhl dx'd 07/1999/ 06/2018   chemo comp 2001; rituxin comp 2005   Osteopenia    Palpitation    PONV (postoperative nausea and vomiting)    Rectocele    Vasovagal syncope    Past Surgical History:  Procedure Laterality Date   ABDOMINAL HYSTERECTOMY     ABDOMINAL SURGERY  2001   for non hodgkins lymphoma; lymphoma in mesentary   ANAL RECTAL MANOMETRY N/A 12/24/2015   Procedure: ANO RECTAL MANOMETRY;  Surgeon: Kenney Peacemaker, MD;  Location: [facility];  Service: Endoscopy;  Laterality: N/A;   BLADDER SUSPENSION     BUNIONECTOMY     CARDIAC CATHETERIZATION     CATARACT EXTRACTION     10/2023   CHOLECYSTECTOMY  2003   COLONOSCOPY  7/200/, 11/2007   diverticulosis   FINE NEEDLE ASPIRATION BIOPSY Left 06/16/2018   left mesentery tissue   LEFT HEART CATHETERIZATION WITH CORONARY ANGIOGRAM N/A 10/16/2013   Procedure: LEFT HEART CATHETERIZATION WITH CORONARY ANGIOGRAM;  Surgeon: Peter M  [place], MD;  Location: [facility] C[facility];  Service: Cardiovascular;  Laterality: N/A;   PARATHYROIDECTOMY Right 05/30/2023   Procedure: RIGHT INFERIOR PARATHYROIDECTOMY;  Surgeon: Oralee Billow, MD;  Location: [facility];  Service: General;  Laterality: Right;   pelvic prolapse repair     TONSILLECTOMY     UPPER GASTROINTESTINAL ENDOSCOPY  10/2005   hiatal hernia   Social History   Social History Narrative   Retired, widowed, 1 child   Rare EtOH, former smoker none  now   No drugs   Right handed   family history includes Colon cancer (age of onset: 39) in her father; Colon polyps in her brother and brother; Osteoporosis in her mother; Prostate cancer in her brother; Stomach cancer in her maternal grandfather and maternal uncle; Thyroid  disease in her mother.   Review of Systems As per HPI  Objective:   Physical Exam BP 110/64   Pulse 73   Ht 5\' 2"  (1.575 m)   Wt 130 lb 7 oz (59.2 kg)   BMI 23.86 kg/m  NAD Abd soft NT BS +  Data reviewed-see HPI I

## 2023-10-06 DIAGNOSIS — H2513 Age-related nuclear cataract, bilateral: Secondary | ICD-10-CM | POA: Diagnosis not present

## 2023-10-13 DIAGNOSIS — H25812 Combined forms of age-related cataract, left eye: Secondary | ICD-10-CM | POA: Diagnosis not present

## 2023-10-27 DIAGNOSIS — H25811 Combined forms of age-related cataract, right eye: Secondary | ICD-10-CM | POA: Diagnosis not present

## 2023-11-03 ENCOUNTER — Ambulatory Visit: Admitting: Nurse Practitioner

## 2023-11-03 VITALS — Temp 98.1°F | Ht 62.0 in | Wt 131.0 lb

## 2023-11-03 DIAGNOSIS — I10 Essential (primary) hypertension: Secondary | ICD-10-CM

## 2023-11-03 DIAGNOSIS — E782 Mixed hyperlipidemia: Secondary | ICD-10-CM

## 2023-11-03 DIAGNOSIS — G479 Sleep disorder, unspecified: Secondary | ICD-10-CM | POA: Diagnosis not present

## 2023-11-03 DIAGNOSIS — Z9089 Acquired absence of other organs: Secondary | ICD-10-CM | POA: Diagnosis not present

## 2023-11-03 DIAGNOSIS — R5383 Other fatigue: Secondary | ICD-10-CM

## 2023-11-03 DIAGNOSIS — Z0001 Encounter for general adult medical examination with abnormal findings: Secondary | ICD-10-CM

## 2023-11-03 DIAGNOSIS — Z9889 Other specified postprocedural states: Secondary | ICD-10-CM | POA: Diagnosis not present

## 2023-11-03 DIAGNOSIS — Z0000 Encounter for general adult medical examination without abnormal findings: Secondary | ICD-10-CM

## 2023-11-03 NOTE — Progress Notes (Signed)
 Subjective:    Patient ID: [name] [name], female    DOB: January 15, 1941, 83 y.o.   MRN: [id number]  HPI AWV- Annual Wellness Visit  The patient was seen for their annual wellness visit. The patient's past medical history, surgical history, and family history were reviewed. Pertinent vaccines were reviewed ( tetanus, pneumonia, shingles, flu) The patient's medication list was reviewed and updated.  The height and weight were entered.  BMI recorded in electronic record elsewhere  Cognitive screening was completed. Outcome of Mini - Cog: 5   Falls /depression screening electronically recorded within record elsewhere  Current tobacco usage:no (All patients who use tobacco were given written and verbal information on quitting)  Recent listing of emergency department/hospitalizations over the past year were reviewed. Current specialists the patient sees on a regular basis: orthopedic, heart, gastro, rheumatology, endocrinology   Medicare annual wellness visit patient questionnaire was reviewed.  A written screening schedule for the patient for the next 5-10 years was given. Appropriate discussion of followup regarding next visit was discussed. Complaints of low energy for the past 3-4 months.  No new sexual partners.  Regular follow up with her specialists. Sees Dr. Mai for her osteoporosis. Plans to discuss Prolia. Needs refill on Xanax  for sleep which she uses on a rare basis. Walks 2 miles per day but has noticed more fatigue at times. Has had to stop once due to feeling tired. No CP or SOB. Last visit with cardiology 08/05/23. BP at home has been stable overall.  Had right inferior parathyroidectomy 05/30/23. Diet: overall good.  Sees ophthalmology for eye issues. Regular dental care. Vaccines up to date except for deferring COVID booster at this time.  No further colonoscopies unless indicated due to age according to GI notes from 2019.     Review of Systems   Constitutional:  Positive for fatigue. Negative for activity change and appetite change.  HENT:  Negative for sore throat and trouble swallowing.   Respiratory:  Negative for cough, chest tightness, shortness of breath and wheezing.   Cardiovascular:  Negative for chest pain.  Gastrointestinal:  Positive for diarrhea. Negative for blood in stool, constipation, nausea and vomiting.       Has IBS diarrhea. Followed by GI.   Genitourinary:  Negative for difficulty urinating, dysuria, enuresis, frequency, genital sores, pelvic pain, urgency, vaginal bleeding and vaginal discharge.      08/24/2023   11:25 AM  Depression screen PHQ 2/9  Decreased Interest 0  Down, Depressed, Hopeless 0  PHQ - 2 Score 0  Altered sleeping 0  Tired, decreased energy 0  Change in appetite 0  Feeling bad or failure about yourself  0  Trouble concentrating 0  Moving slowly or fidgety/restless 0  Suicidal thoughts 0  PHQ-9 Score 0  Difficult doing work/chores Not difficult at all      08/24/2023   11:25 AM 05/11/2023    9:58 AM 03/07/2023   11:02 AM 01/31/2023   10:39 AM  GAD 7 : Generalized Anxiety Score  Nervous, Anxious, on Edge 0 0 0 0  Control/stop worrying 0 0 0 0  Worry too much - different things 0 0 0 0  Trouble relaxing 0 0 0 0  Restless 0 0 0 0  Easily annoyed or irritable 0 0 0 0  Afraid - awful might happen 0 0 0 0  Total GAD 7 Score 0 0 0 0  Anxiety Difficulty Not difficult at all Not difficult at all Not difficult at  all Not difficult at all    Social History   Tobacco Use   Smoking status: Former    Current packs/day: 0.33    Types: Cigarettes    Passive exposure: Never   Smokeless tobacco: Never   Tobacco comments:    quit 1976  Vaping Use   Vaping status: Never Used  Substance Use Topics   Alcohol use: Not Currently    Comment: very rare   Drug use: No   ACP: none     Objective:   Physical Exam Vitals and nursing note reviewed.  Constitutional:      General: She is  not in acute distress.    Appearance: She is well-developed.  Neck:     Thyroid : No thyromegaly.     Trachea: No tracheal deviation.     Comments: Thyroid  non tender to palpation. No mass or goiter noted.  Cardiovascular:     Rate and Rhythm: Normal rate and regular rhythm.  Pulmonary:     Effort: Pulmonary effort is normal.     Breath sounds: Normal breath sounds.  Chest:  Breasts:    Right: No swelling, inverted nipple, mass, skin change or tenderness.     Left: No swelling, inverted nipple, mass, skin change or tenderness.  Abdominal:     General: There is no distension.     Palpations: Abdomen is soft.     Tenderness: There is no abdominal tenderness.  Genitourinary:    Comments: Defers GU/pelvic exam. Denies any problems.  Musculoskeletal:     Cervical back: Normal range of motion and neck supple.     Right lower leg: No edema.     Left lower leg: No edema.  Lymphadenopathy:     Cervical: No cervical adenopathy.     Upper Body:     Right upper body: No supraclavicular, axillary or pectoral adenopathy.     Left upper body: No supraclavicular, axillary or pectoral adenopathy.  Skin:    General: Skin is warm and dry.  Neurological:     Mental Status: She is alert and oriented to person, place, and time.  Psychiatric:        Mood and Affect: Mood normal.        Behavior: Behavior normal.        Thought Content: Thought content normal.        Judgment: Judgment normal.    Today's Vitals   11/03/23 1427  Temp: 98.1 F (36.7 C)  SpO2: 97%  Weight: 131 lb (59.4 kg)  Height: 5' 2 (1.575 m)   Body mass index is 23.96 kg/m.  Orthostatic BP and pulses stable.           Assessment & Plan:   Problem List Items Addressed This Visit       Cardiovascular and Mediastinum   HTN (hypertension)   Relevant Orders   Comprehensive metabolic panel with GFR     Other   Hyperlipidemia   Relevant Orders   Comprehensive metabolic panel with GFR   Lipid panel    Sleep disturbance   Relevant Medications   ALPRAZolam  (XANAX ) 0.25 MG tablet   Other Visit Diagnoses       Encounter for subsequent annual wellness visit in Medicare patient    -  Primary     Well woman exam (no gynecological exam)         Fatigue, unspecified type       Relevant Orders   CBC with Differential/Platelet   Comprehensive metabolic  panel with GFR   TSH     H/O parathyroidectomy       Relevant Orders   TSH      Meds ordered this encounter  Medications   ALPRAZolam  (XANAX ) 0.25 MG tablet    Sig: Take one tab po at bedtime prn    Dispense:  30 tablet    Refill:  0    Supervising Provider:   ALPHONSA HAMILTON A [[id number]]   Continue rare use of Xanax  for sleep prn. Labs ordered. Freestyle Libre applied during visit to monitor BS to see if any episodes of hypoglycemia.  Patient to check BP if possible during extreme fatigue to see if any changes.  Recommend follow up with cardiology if continues. Contact our office if persists.  Return in about 1 year (around 11/02/2024) for physical.

## 2023-11-07 ENCOUNTER — Encounter: Payer: Self-pay | Admitting: Nurse Practitioner

## 2023-11-07 DIAGNOSIS — G479 Sleep disorder, unspecified: Secondary | ICD-10-CM | POA: Insufficient documentation

## 2023-11-07 MED ORDER — ALPRAZOLAM 0.25 MG PO TABS: ORAL_TABLET | ORAL | 0 refills | Status: AC

## 2023-11-17 ENCOUNTER — Inpatient Hospital Stay: Payer: PPO | Attending: Oncology

## 2023-11-17 ENCOUNTER — Inpatient Hospital Stay: Payer: PPO | Admitting: Oncology

## 2023-11-17 VITALS — BP 118/60 | HR 60 | Temp 97.8°F | Resp 16 | Ht 62.0 in | Wt 132.3 lb

## 2023-11-17 DIAGNOSIS — C821A Follicular lymphoma grade II, in remission: Secondary | ICD-10-CM | POA: Diagnosis not present

## 2023-11-17 DIAGNOSIS — K589 Irritable bowel syndrome without diarrhea: Secondary | ICD-10-CM | POA: Insufficient documentation

## 2023-11-17 DIAGNOSIS — Z9221 Personal history of antineoplastic chemotherapy: Secondary | ICD-10-CM | POA: Diagnosis not present

## 2023-11-17 DIAGNOSIS — Z860100 Personal history of colon polyps, unspecified: Secondary | ICD-10-CM | POA: Insufficient documentation

## 2023-11-17 DIAGNOSIS — C8203 Follicular lymphoma grade I, intra-abdominal lymph nodes: Secondary | ICD-10-CM

## 2023-11-17 DIAGNOSIS — D351 Benign neoplasm of parathyroid gland: Secondary | ICD-10-CM | POA: Insufficient documentation

## 2023-11-17 DIAGNOSIS — Z79899 Other long term (current) drug therapy: Secondary | ICD-10-CM | POA: Insufficient documentation

## 2023-11-17 DIAGNOSIS — M8580 Other specified disorders of bone density and structure, unspecified site: Secondary | ICD-10-CM | POA: Diagnosis not present

## 2023-11-17 LAB — CBC WITH DIFFERENTIAL (CANCER CENTER ONLY)
Abs Immature Granulocytes: 0.04 K/uL (ref 0.00–0.07)
Basophils Absolute: 0.1 K/uL (ref 0.0–0.1)
Basophils Relative: 1 %
Eosinophils Absolute: 0.1 K/uL (ref 0.0–0.5)
Eosinophils Relative: 1 %
HCT: 38.5 % (ref 36.0–46.0)
Hemoglobin: 12.6 g/dL (ref 12.0–15.0)
Immature Granulocytes: 0 %
Lymphocytes Relative: 17 %
Lymphs Abs: 1.6 K/uL (ref 0.7–4.0)
MCH: 30.5 pg (ref 26.0–34.0)
MCHC: 32.7 g/dL (ref 30.0–36.0)
MCV: 93.2 fL (ref 80.0–100.0)
Monocytes Absolute: 1 K/uL (ref 0.1–1.0)
Monocytes Relative: 10 %
Neutro Abs: 6.5 K/uL (ref 1.7–7.7)
Neutrophils Relative %: 71 %
Platelet Count: 251 K/uL (ref 150–400)
RBC: 4.13 MIL/uL (ref 3.87–5.11)
RDW: 12.9 % (ref 11.5–15.5)
WBC Count: 9.3 K/uL (ref 4.0–10.5)
nRBC: 0 % (ref 0.0–0.2)

## 2023-11-17 LAB — CMP (CANCER CENTER ONLY)
ALT: 16 U/L (ref 0–44)
AST: 22 U/L (ref 15–41)
Albumin: 4.4 g/dL (ref 3.5–5.0)
Alkaline Phosphatase: 66 U/L (ref 38–126)
Anion gap: 11 (ref 5–15)
BUN: 27 mg/dL — ABNORMAL HIGH (ref 8–23)
CO2: 25 mmol/L (ref 22–32)
Calcium: 10 mg/dL (ref 8.9–10.3)
Chloride: 103 mmol/L (ref 98–111)
Creatinine: 1 mg/dL (ref 0.44–1.00)
GFR, Estimated: 56 mL/min — ABNORMAL LOW (ref >60)
Glucose, Bld: 90 mg/dL (ref 70–99)
Potassium: 4.9 mmol/L (ref 3.5–5.1)
Sodium: 139 mmol/L (ref 135–145)
Total Bilirubin: 0.2 mg/dL (ref 0.0–1.2)
Total Protein: 6.4 g/dL — ABNORMAL LOW (ref 6.5–8.1)

## 2023-11-17 LAB — TSH: TSH: 2.58 u[IU]/mL (ref 0.350–4.500)

## 2023-11-17 NOTE — Progress Notes (Signed)
 [facility] OFFICE PROGRESS NOTE   Diagnosis: Non-Hodgkin's lymphoma  INTERVAL HISTORY:   [name] returns as scheduled.  She underwent a right parathyroidectomy in January.  Good appetite and energy level.  She walks 2 miles 5 days/week.  No fever or drenching night sweats.  She complains of malaise for the past month.  Her blood pressure has been running low.  She had to stop when coming up her basement stairs recently.  No chest pain.  No dyspnea. She been diagnosed with ocular migraines and underwent a negative brain MRI 09/10/2023 Objective:  Vital signs in last 24 hours:  Blood pressure 118/60, pulse 60, temperature 97.8 F (36.6 C), temperature source Temporal, resp. rate 16, height 5' 2 (1.575 m), weight 132 lb 4.8 oz (60 kg), SpO2 100%.     Lymphatics: No cervical, supraclavicular, axillary, or inguinal nodes Resp: Lungs clear bilaterally Cardio: Regular rate and rhythm GI: No hepatosplenomegaly, nontender, no mass Vascular: No leg edema  Lab Results:  Lab Results  Component Value Date   WBC 9.3 11/17/2023   HGB 12.6 11/17/2023   HCT 38.5 11/17/2023   MCV 93.2 11/17/2023   PLT 251 11/17/2023   NEUTROABS 6.5 11/17/2023    CMP  Lab Results  Component Value Date   NA 139 11/17/2023   K 4.9 11/17/2023   CL 103 11/17/2023   CO2 25 11/17/2023   GLUCOSE 90 11/17/2023   BUN 27 (H) 11/17/2023   CREATININE 1.00 11/17/2023   CALCIUM  10.0 11/17/2023   PROT 6.4 (L) 11/17/2023   ALBUMIN 4.4 11/17/2023   AST 22 11/17/2023   ALT 16 11/17/2023   ALKPHOS 66 11/17/2023   BILITOT 0.2 11/17/2023   GFRNONAA 56 (L) 11/17/2023   GFRAA >60 01/17/2020    No results found for: CEA1, CEA, CAN199, CA125  Lab Results  Component Value Date   INR 0.94 06/16/2018   LABPROT 12.5 06/16/2018    Imaging:  No results found.  Medications: I have reviewed the patient's current medications.   Assessment/Plan: Non-Hodgkin's lymphoma-follicular  lymphoma Mesenteric lymph node biopsy 07/15/1999- follicular center cell lymphoma, grade 2 Treated with mitoxantrone, fludarabine, and rituximab  completed in January 2002 Progressive disease in February 2004, treated with single agent rituximab  February 2004 through August 2005 CTs 06/21/2014- no evidence of lymphoma CT abdomen/pelvis 06/05/2018- enlarging left mid abdominal mesenteric mass CT biopsy of the mesenteric mass 06/16/2018-follicular lymphoma, low-grade, CD20 positive Cycle 1 weekly Rituxan  07/24/2018 Cycle 2 weekly Rituxan  07/31/2018  Cycle 3 weekly Rituxan  08/07/2018 Cycle 4 weekly Rituxan  08/14/2018 CT abdomen/pelvis 10/09/2018- reduction in size of left lower quadrant mesenteric mass Cycle 1 maintenance rituximab  10/19/2018 Cycle 2 maintenance Rituxan  12/14/2018 Cycle 3 maintenance Rituxan  02/08/2019 Cycle 4 maintenance Rituxan  04/09/2019 CT abdomen/pelvis 05/31/2019-no change in jejunal mesenteric mass, no evidence of disease progression. Cycle 5 maintenance rituximab  06/07/2019 Cycle 6 maintenance rituximab  08/02/2019 Cycle 7 maintenance rituximab  09/27/2019 CT abdomen/pelvis 11/20/2019-further contraction of the lesion at the small bowel mesentery, no evidence of progressive lymphoma Cycle 8 maintenance rituximab  11/22/2019 Cycle 9 maintenance rituximab  01/17/2020 Cycle 10 maintenance rituximab  03/13/2020 Cycle 11 maintenance rituximab  05/08/2020 Cycle 12 maintenance rituximab  07/03/2020 Cycle 13 maintenance rituximab  08/28/2020 CTs 10/28/2020-unchanged soft tissue in the left abdominal small bowel mesentery, no evidence of lymphadenopathy or recurrent disease elsewhere in the chest, abdomen, or pelvis CTs 08/10/2022-stable posttreatment appearance of soft tissue in the left small bowel mesentery, no evidence of recurrent lymphoma Irritable bowel syndrome History of anal incontinence Osteopenia History of colon polyps  Weakness, flushing, presyncope and chills during Rituxan  infusion  07/24/2018. Mild hypercalcemia-chronic, diagnosed with mild primary hyperparathyroidism-followed by endocrinology Right parathyroid  adenoma diagnosed on ultrasound/nuclear medicine scan 03/29/2023 Right inferior parathyroidectomy 05/30/2023, pathology with no evidence of malignancy, hypercellular parathyroid  tissue COVID-19 04/10/2021 Zoster rash, right posterior lateral chest wall 04/30/2021       Disposition: [name] [name] remains in clinical remission from lymphoma.  She would like to undergo restaging CTs in 6 months.  She will receive a premedication regimen prior to IV contrast. [name] will follow-up with Dr. Bluford to evaluate the malaise and fatigue with exertion.  We will check thyroid  function studies today.  She will return for an office visit after the restaging CTs in 6 months.  Arley Hof, MD  11/17/2023  3:02 PM

## 2023-11-18 ENCOUNTER — Telehealth: Payer: Self-pay | Admitting: Cardiology

## 2023-11-18 DIAGNOSIS — H43813 Vitreous degeneration, bilateral: Secondary | ICD-10-CM | POA: Diagnosis not present

## 2023-11-18 DIAGNOSIS — Z79899 Other long term (current) drug therapy: Secondary | ICD-10-CM | POA: Diagnosis not present

## 2023-11-18 DIAGNOSIS — H468 Other optic neuritis: Secondary | ICD-10-CM | POA: Diagnosis not present

## 2023-11-18 DIAGNOSIS — H35342 Macular cyst, hole, or pseudohole, left eye: Secondary | ICD-10-CM | POA: Diagnosis not present

## 2023-11-18 DIAGNOSIS — H35033 Hypertensive retinopathy, bilateral: Secondary | ICD-10-CM | POA: Diagnosis not present

## 2023-11-18 DIAGNOSIS — H43392 Other vitreous opacities, left eye: Secondary | ICD-10-CM | POA: Diagnosis not present

## 2023-11-18 LAB — T4: T4, Total: 6.7 ug/dL (ref 4.5–12.0)

## 2023-11-18 NOTE — Telephone Encounter (Signed)
 Left voice message to call back 7/18

## 2023-11-18 NOTE — Telephone Encounter (Signed)
 Spoke to pt regarding her blood pressure. Pt was a Mudlogger pt, referred to Dr. Jeffrie and has an appointment with Orren Fabry, PA-C on 01/03/24. Pt stated her blood pressure has been low. Pt's blood pressures are as follows: 7/15 93/44, 102/48 7/16 123/64, 101/52 (after 2 mile walk) 7/17 125/57, 99/43 (after 2 mile walk) 7/18 108/47 (after 2 mile walk) Pt stated her blood pressure typically runs around 135/65. Pt stated she does not have any symptoms other than feeling weak and shaky. Pt stated this has been going on for about 3 weeks. Pt stated he drinks plenty of water. Pt stated she has been to her oncologist recently and her bp was 117/55. Pt was told to follow up with cardiology. Pt was told to hold atenolol  if her systolic blood pressure is less than 110 until we can get back to her with suggestions. Pt was also told to keep a log of her blood pressures and heart rates. Pt was told that should she begin to have symptoms such as lightheadedness or dizziness, presyncope or syncope that should would need to report to the ED. Pt was also told to change positions slowly. Pt was informed that the information she provided would be forwarded to Dr. Jeffrie as well as Orren Fabry, PA-C. Pt verbalized understanding. All questions if any were answered.

## 2023-11-18 NOTE — Telephone Encounter (Signed)
 Pt c/o BP issue: STAT if pt c/o blurred vision, one-sided weakness or slurred speech.  STAT if BP is GREATER than 180/120 TODAY.  STAT if BP is LESS than 90/60 and SYMPTOMATIC TODAY  1. What is your BP concern?   Low BP reading  2. Have you taken any BP medication today?  Yes  3. What are your last 5 BP readings?  Last night - 8:30 pm, 116/55 8:30 am this morning, after walking - 108/47  4. Are you having any other symptoms (ex. Dizziness, headache, blurred vision, passed out)?   Feels very weak, no energy and shaky.  Patient is concerned she has been having low BP readings.

## 2023-11-21 NOTE — Telephone Encounter (Signed)
 Pt called to report bp readings over weekend.    7/18   6:00PM  - 144/58 hr 70            8:30pm - 99/48 hr 65            8:45pm  -  142/62 hr 73   7/19   7:00am - 121/62 hr 76           10:30am - 122/59 hr 77           2:00pm  - 127/60 hr 87            8:30 pm - 131/66 hr 54  7/20   7:00am  - 125/68 hr 81            9:45am   - 123/60 hr70           1:00pm   - 118/57 hr 67             5:00pm  - 140/68 hr 77            8:30pm  - 134/62 hr 67            10:00pm -  144/65 hr 67   7/21:   6:00am - 117/66 hr 79            8:30am   - 114/61 hr 75

## 2023-11-22 NOTE — Telephone Encounter (Signed)
 Called patient back with Orren Fabry PA's message. Patient stated that her SBP was in the 90's this morning and HR was 127 after walking. Patient took Atenolol  25 mg at 7:00 and was done with her walk around 8:30. Patient also wanted her to know that she gets winded walking upstairs and with walking for exercise. Patient stated she had no energy, and gets a palpitations that happens every now and then, but only last few seconds. Will forward to patient's provider for advisement.

## 2023-11-24 DIAGNOSIS — G245 Blepharospasm: Secondary | ICD-10-CM | POA: Diagnosis not present

## 2023-11-24 NOTE — Telephone Encounter (Signed)
 [name] Oneil BROCKS, MD to [name], [name], RN  [name], [place]     11/24/23  2:07 PM Recommend she see PCP for work up. May be on chronic prednisone  according to med list. This may be a contributor.    Thanks [name], MD   Pt made aware of the above recommendations per Dr. Jeffrie.   Pt verbalized understanding and agrees with this plan.

## 2023-11-25 ENCOUNTER — Ambulatory Visit: Payer: Self-pay

## 2023-11-25 ENCOUNTER — Ambulatory Visit

## 2023-11-25 NOTE — Telephone Encounter (Signed)
 FYI Only or Action Required?: Action required by provider: request for appointment. - pt is requesting a call back for further instructions until she can be seen on Monday  Patient was last seen in primary care on 11/03/2023 by Mauro Elveria BROCKS, NP.  Called Nurse Triage reporting Hypotension.  Symptoms began noticed the last week or so. Also had low BP at office.  Interventions attempted: Nothing. Has tried to call cardiologist - appt in sept.  Symptoms are: unchanged.  Triage Disposition: See Physician Within 24 Hours  Patient/caregiver understands and will follow disposition?: Yes - soonest appt is Monday. Please advise.                  Copied from CRM #[id number]. Topic: Clinical - Red Word Triage >> Nov 25, 2023  9:11 AM Ivette P wrote: [facility] that prompted transfer to Nurse Triage:  Low every morning after walk. Blood pressure low.   98/54  99/43  105/51 - when got up 6:30AM  96/50 - at 8:30AM  feel weak, and shaky but no dizziness. Reason for Disposition  [1] Systolic BP 90-110 AND [2] taking blood pressure medications AND [3] NOT feeling weak or lightheaded  Answer Assessment - Initial Assessment Questions 1. BLOOD PRESSURE: What is your blood pressure? Did you take at least two measurements 5 minutes apart?     Pt takes BP readings throughout the day 2. ONSET: When did you take your blood pressure?     A couple days  3. HOW: How did you take your blood pressure? (e.g., visiting nurse, automatic home BP monitor)     Automatic - pt also had low bp at recent office visit 4. HISTORY: Do you have a history of low blood pressure? What is your blood pressure normally?     no 5. MEDICINES: Are you taking any medicines for blood pressure? If Yes, ask: Have they been changed recently?     Pt has taken atenolol  for many years 6. PULSE RATE: Do you know what your pulse rate is?      60 7. OTHER SYMPTOMS: Have you been sick recently? Have  you had a recent injury?     no  Protocols used: Blood Pressure - Low-A-AH

## 2023-11-28 ENCOUNTER — Ambulatory Visit: Admitting: Physician Assistant

## 2023-11-29 ENCOUNTER — Other Ambulatory Visit: Payer: Self-pay | Admitting: *Deleted

## 2023-11-29 MED ORDER — PREDNISONE 50 MG PO TABS: ORAL_TABLET | ORAL | 0 refills | Status: DC

## 2023-11-30 ENCOUNTER — Ambulatory Visit: Attending: Family Medicine

## 2023-11-30 ENCOUNTER — Encounter: Payer: Self-pay | Admitting: Family Medicine

## 2023-11-30 ENCOUNTER — Other Ambulatory Visit: Payer: Self-pay | Admitting: Family Medicine

## 2023-11-30 ENCOUNTER — Ambulatory Visit: Admitting: Family Medicine

## 2023-11-30 VITALS — BP 136/74 | HR 77 | Temp 97.9°F | Ht 62.0 in | Wt 132.4 lb

## 2023-11-30 DIAGNOSIS — R002 Palpitations: Secondary | ICD-10-CM

## 2023-11-30 DIAGNOSIS — I959 Hypotension, unspecified: Secondary | ICD-10-CM | POA: Diagnosis not present

## 2023-11-30 DIAGNOSIS — R0600 Dyspnea, unspecified: Secondary | ICD-10-CM

## 2023-11-30 NOTE — Progress Notes (Unsigned)
 EP to read.

## 2023-11-30 NOTE — Assessment & Plan Note (Addendum)
 Etiology and prognosis unclear at this time. Stopping Atenolol .  Given symptoms with exertion (? DOE), EKG was obtained. Independent interpretation: NSR. LVH. No ischemia. Arranging for Echo and Zio patch - Orders only encounter as Echo cannot be ordered during an encounter for some reason. Advised to rest and avoid significant exertion as this time.

## 2023-11-30 NOTE — Patient Instructions (Signed)
 We will call an schedule Echo.  Working on holter.  Follow up in ~ 1 month (pending xray).

## 2023-11-30 NOTE — Progress Notes (Signed)
 Subjective:  Patient ID: [name] [name], female    DOB: 07/01/1940  Age: 83 y.o. MRN: [id number]  CC:  Low BP, weakness, ? SOB   HPI:  83 year old female presents for evaluation of the above.  Over the past few weeks patient has felt weak and fatigued. Has had low BP readings at home (90's/50's systolic). Typically low BP readings are in the morning or after walking. Overall is not feeling well. Has been on Atenolol  for many years. Call cardiology office and was advised to hold if BP was low. Last dose was yesterday.  Patient reports that when she takes her typical 2 mile walk, she finds herself having to stop as she feels weak and has no energy.? DOE.  Reports some palpitations. No chest pain.  Patient Active Problem List   Diagnosis Date Noted   Hypotension 11/30/2023   Sleep disturbance 11/07/2023   Hyperparathyroidism, primary (HCC) 12/17/2021   Age-related osteoporosis without current pathological fracture 12/17/2021   Peripheral neuropathy 08/26/2016   Dyssynergic defecation 12/31/2015   HTN (hypertension) 04/18/2015   NSVT (nonsustained ventricular tachycardia) (HCC) 11/27/2013   Allergic rhinitis 08/20/2013   Hyperlipidemia 08/27/2008   MVP (mitral valve prolapse) 08/27/2008    IBS- diarrhea predominant 10/13/2007   NHL (non-Hodgkin's lymphoma) (HCC) 06/16/2007    Social Hx   Social History   Socioeconomic History   Marital status: Widowed    Spouse name: Not on file   Number of children: 1   Years of education: Not on file   Highest education level: 12th grade  Occupational History   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  Tobacco Use   Smoking status: Former    Current packs/day: 0.33    Types: Cigarettes    Passive exposure: Never   Smokeless tobacco: Never   Tobacco comments:    quit 1976  Vaping Use   Vaping status: Never Used  Substance and Sexual Activity   Alcohol use: Not Currently    Comment: very rare   Drug use: No    Sexual activity: Yes    Birth control/protection: Surgical  Other Topics Concern   Not on file  Social History Narrative   Retired, widowed, 1 child   Rare EtOH, former smoker none now   No drugs   Right handed   Social Drivers of Corporate investment banker Strain: Low Risk  (10/31/2023)   Overall Financial Resource Strain (CARDIA)    Difficulty of Paying Living Expenses: Not hard at all  Food Insecurity: No Food Insecurity (10/31/2023)   Hunger Vital Sign    Worried About Running Out of Food in the Last Year: Never true    Ran Out of Food in the Last Year: Never true  Transportation Needs: No Transportation Needs (10/31/2023)   PRAPARE - Administrator, Civil Service (Medical): No    Lack of Transportation (Non-Medical): No  Physical Activity: Sufficiently Active (10/31/2023)   Exercise Vital Sign    Days of Exercise per Week: 5 days    Minutes of Exercise per Session: 60 min  Stress: No Stress Concern Present (10/31/2023)   [employer] of Occupational Health - Occupational Stress Questionnaire    Feeling of Stress: Not at all  Social Connections: Moderately Isolated (10/31/2023)   Social Connection and Isolation Panel    Frequency of Communication with Friends and Family: More than three times a week    Frequency of Social Gatherings with Friends  and Family: Three times a week    Attends Religious Services: More than 4 times per year    Active Member of Clubs or Organizations: No    Attends Banker Meetings: Not on file    Marital Status: Widowed    Review of Systems Per HPI  Objective:  BP 136/74   Pulse 77   Temp 97.9 F (36.6 C)   Ht 5' 2 (1.575 m)   Wt 132 lb 6.4 oz (60.1 kg)   SpO2 98%   BMI 24.22 kg/m      11/30/2023    3:54 PM 11/17/2023    2:28 PM 11/03/2023    2:27 PM  BP/Weight  Systolic BP 136 118   Diastolic BP 74 60   Wt. (Lbs) 132.4 132.3 131  BMI 24.22 kg/m2 24.2 kg/m2 23.96 kg/m2    Physical Exam Vitals and  nursing note reviewed.  Constitutional:      General: She is not in acute distress.    Appearance: Normal appearance.  Eyes:     General:        Right eye: No discharge.     Conjunctiva/sclera: Conjunctivae normal.  Cardiovascular:     Rate and Rhythm: Normal rate and regular rhythm.     Comments: No LE edema. Pulmonary:     Effort: Pulmonary effort is normal.     Breath sounds: Normal breath sounds. No wheezing or rales.  Neurological:     Mental Status: She is alert.     Lab Results  Component Value Date   WBC 9.3 11/17/2023   HGB 12.6 11/17/2023   HCT 38.5 11/17/2023   PLT 251 11/17/2023   GLUCOSE 90 11/17/2023   CHOL 178 01/18/2023   TRIG 179 (H) 01/18/2023   HDL 51 01/18/2023   LDLCALC 96 01/18/2023   ALT 16 11/17/2023   AST 22 11/17/2023   NA 139 11/17/2023   K 4.9 11/17/2023   CL 103 11/17/2023   CREATININE 1.00 11/17/2023   BUN 27 (H) 11/17/2023   CO2 25 11/17/2023   TSH 2.580 11/17/2023   INR 0.94 06/16/2018   HGBA1C 5.9 (H) 08/24/2023   EKG - NSR at the rate of 67. LVH noted. No signs of ischemia.  Assessment & Plan:  Hypotension, unspecified hypotension type Assessment & Plan: Etiology and prognosis unclear at this time. Stopping Atenolol .  Given symptoms with exertion (? DOE), EKG was obtained. Independent interpretation: NSR. LVH. No ischemia. Arranging for Echo and Zio patch - Orders only encounter as Echo cannot be ordered during an encounter for some reason. Advised to rest and avoid significant exertion as this time.     Follow-up:  2 weeks.  Jacqulyn Ahle DO [facility]

## 2023-12-15 ENCOUNTER — Ambulatory Visit: Admitting: Family Medicine

## 2023-12-16 ENCOUNTER — Ambulatory Visit (HOSPITAL_COMMUNITY)
Admission: RE | Admit: 2023-12-16 | Discharge: 2023-12-16 | Disposition: A | Discharge status: Home / Self Care | Source: Ambulatory Visit | Attending: Family Medicine | Admitting: Family Medicine

## 2023-12-16 DIAGNOSIS — R0600 Dyspnea, unspecified: Secondary | ICD-10-CM | POA: Diagnosis not present

## 2023-12-16 LAB — ECHOCARDIOGRAM COMPLETE
Area-P 1/2: 5.16 cm2
S' Lateral: 3.3 cm

## 2023-12-18 ENCOUNTER — Ambulatory Visit: Payer: Self-pay | Admitting: Family Medicine

## 2023-12-20 DIAGNOSIS — R0600 Dyspnea, unspecified: Secondary | ICD-10-CM | POA: Diagnosis not present

## 2023-12-20 DIAGNOSIS — R002 Palpitations: Secondary | ICD-10-CM | POA: Diagnosis not present

## 2023-12-21 ENCOUNTER — Encounter: Payer: Self-pay | Admitting: Family Medicine

## 2023-12-21 ENCOUNTER — Ambulatory Visit (INDEPENDENT_AMBULATORY_CARE_PROVIDER_SITE_OTHER): Admitting: Family Medicine

## 2023-12-21 VITALS — BP 133/83 | HR 96 | Temp 97.2°F | Ht 62.0 in | Wt 132.0 lb

## 2023-12-21 DIAGNOSIS — I472 Ventricular tachycardia, unspecified: Secondary | ICD-10-CM

## 2023-12-21 DIAGNOSIS — I4729 Other ventricular tachycardia: Secondary | ICD-10-CM

## 2023-12-21 MED ORDER — METOPROLOL SUCCINATE ER 25 MG PO TB24: 25.0000 mg | ORAL_TABLET | Freq: Every day | ORAL | 3 refills | Status: DC

## 2023-12-21 NOTE — Patient Instructions (Addendum)
 Metoprolol  as prescribed.  I have reached out to cardiology.  Nothing in labs to suggest issues with your prior cancer. See if Cloretta will move scan up.

## 2023-12-22 DIAGNOSIS — I472 Ventricular tachycardia, unspecified: Secondary | ICD-10-CM | POA: Insufficient documentation

## 2023-12-22 NOTE — Assessment & Plan Note (Signed)
 Spoke with cardiology, PA Lucien.  Referring to electrophysiology.  Starting metoprolol .

## 2023-12-22 NOTE — Progress Notes (Signed)
 Subjective:  Patient ID: [name] [name], female    DOB: 1940-05-12  Age: 83 y.o. MRN: [id number]  CC:   Chief Complaint  Patient presents with   follow up heart issues and echocardiogram results    HPI:  83 year old female presents for follow up.  Patient recently seen on 7/30.  At that time, she presented with weakness and fatigue.  She had hypotension at home.  She also reported palpitations and dyspnea on exertion.  Echo was obtained and has been reviewed.  Echo unremarkable.  Patient had a Zio patch as well.  It was notable for runs of V. tach as well as SVT.  Will discussed with patient today and with cardiology.  Patient Active Problem List   Diagnosis Date Noted   Ventricular tachycardia (HCC) 12/22/2023   Hypotension 11/30/2023   Sleep disturbance 11/07/2023   Hyperparathyroidism, primary (HCC) 12/17/2021   Age-related osteoporosis without current pathological fracture 12/17/2021   Peripheral neuropathy 08/26/2016   Dyssynergic defecation 12/31/2015   NSVT (nonsustained ventricular tachycardia) (HCC) 11/27/2013   Allergic rhinitis 08/20/2013   Hyperlipidemia 08/27/2008   MVP (mitral valve prolapse) 08/27/2008    IBS- diarrhea predominant 10/13/2007   NHL (non-Hodgkin's lymphoma) (HCC) 06/16/2007    Social Hx   Social History   Socioeconomic History   Marital status: Widowed    Spouse name: Not on file   Number of children: 1   Years of education: Not on file   Highest education level: 12th grade  Occupational History   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  Tobacco Use   Smoking status: Former    Current packs/day: 0.33    Types: Cigarettes    Passive exposure: Never   Smokeless tobacco: Never   Tobacco comments:    quit 1976  Vaping Use   Vaping status: Never Used  Substance and Sexual Activity   Alcohol use: Not Currently    Comment: very rare   Drug use: No   Sexual activity: Yes    Birth control/protection: Surgical  Other  Topics Concern   Not on file  Social History Narrative   Retired, widowed, 1 child   Rare EtOH, former smoker none now   No drugs   Right handed   Social Drivers of Corporate investment banker Strain: Low Risk  (10/31/2023)   Overall Financial Resource Strain (CARDIA)    Difficulty of Paying Living Expenses: Not hard at all  Food Insecurity: No Food Insecurity (10/31/2023)   Hunger Vital Sign    Worried About Running Out of Food in the Last Year: Never true    Ran Out of Food in the Last Year: Never true  Transportation Needs: No Transportation Needs (10/31/2023)   PRAPARE - Administrator, Civil Service (Medical): No    Lack of Transportation (Non-Medical): No  Physical Activity: Sufficiently Active (10/31/2023)   Exercise Vital Sign    Days of Exercise per Week: 5 days    Minutes of Exercise per Session: 60 min  Stress: No Stress Concern Present (10/31/2023)   [employer] of Occupational Health - Occupational Stress Questionnaire    Feeling of Stress: Not at all  Social Connections: Moderately Isolated (10/31/2023)   Social Connection and Isolation Panel    Frequency of Communication with Friends and Family: More than three times a week    Frequency of Social Gatherings with Friends and Family: Three times a week    Attends Religious  Services: More than 4 times per year    Active Member of Clubs or Organizations: No    Attends Banker Meetings: Not on file    Marital Status: Widowed    Review of Systems Per HPI  Objective:  BP 133/83   Pulse 96   Temp (!) 97.2 F (36.2 C)   Ht 5' 2 (1.575 m)   Wt 132 lb (59.9 kg)   SpO2 98%   BMI 24.14 kg/m      12/21/2023    3:30 PM 11/30/2023    3:54 PM 11/17/2023    2:28 PM  BP/Weight  Systolic BP 133 136 118  Diastolic BP 83 74 60  Wt. (Lbs) 132 132.4 132.3  BMI 24.14 kg/m2 24.22 kg/m2 24.2 kg/m2    Physical Exam Vitals and nursing note reviewed.  Constitutional:      General: She is not  in acute distress.    Appearance: Normal appearance.  HENT:     Head: Normocephalic and atraumatic.  Cardiovascular:     Rate and Rhythm: Normal rate and regular rhythm.  Pulmonary:     Effort: Pulmonary effort is normal.     Breath sounds: Normal breath sounds. No wheezing or rales.  Neurological:     Mental Status: She is alert.  Psychiatric:        Mood and Affect: Mood normal.        Behavior: Behavior normal.     Lab Results  Component Value Date   WBC 9.3 11/17/2023   HGB 12.6 11/17/2023   HCT 38.5 11/17/2023   PLT 251 11/17/2023   GLUCOSE 90 11/17/2023   CHOL 178 01/18/2023   TRIG 179 (H) 01/18/2023   HDL 51 01/18/2023   LDLCALC 96 01/18/2023   ALT 16 11/17/2023   AST 22 11/17/2023   NA 139 11/17/2023   K 4.9 11/17/2023   CL 103 11/17/2023   CREATININE 1.00 11/17/2023   BUN 27 (H) 11/17/2023   CO2 25 11/17/2023   TSH 2.580 11/17/2023   INR 0.94 06/16/2018   HGBA1C 5.9 (H) 08/24/2023     Assessment & Plan:  Ventricular tachycardia [facility]) Assessment & Plan: Spoke with cardiology, PA Lucien.  Referring to electrophysiology.  Starting metoprolol .   NSVT (nonsustained ventricular tachycardia) (HCC) Assessment & Plan: Started metoprolol .  Referring to electrophysiology.   Other orders -     Metoprolol  Succinate ER; Take 1 tablet (25 mg total) by mouth daily.  Dispense: 90 tablet; Refill: 3    Follow-up:  Pending visit with Cardiology  Jacqulyn Ahle DO [facility]

## 2023-12-22 NOTE — Assessment & Plan Note (Signed)
 Started metoprolol .  Referring to electrophysiology.

## 2023-12-24 ENCOUNTER — Other Ambulatory Visit: Payer: Self-pay | Admitting: Cardiology

## 2023-12-24 DIAGNOSIS — R9431 Abnormal electrocardiogram [ECG] [EKG]: Secondary | ICD-10-CM

## 2023-12-24 DIAGNOSIS — R002 Palpitations: Secondary | ICD-10-CM

## 2023-12-24 DIAGNOSIS — R0600 Dyspnea, unspecified: Secondary | ICD-10-CM | POA: Diagnosis not present

## 2024-01-01 NOTE — Progress Notes (Unsigned)
 Cardiology Office Note   Date:  01/03/2024  ID:  Musette, Kisamore March 12, 1941, MRN [id number] PCP: Cook, Jayce G, DO  [facility] Providers Cardiologist:  None Cardiology APP:  [name], PA-C    History of Present Illness [name] [name] is a 83 y.o. female with past medical history of palpitations who was seen by Dr. Hobart.  She also has a history of non-Hodgkin's lymphoma.  NSVT which is stable on atenolol .  She was last seen by Dr. Alveta in April.  She had a brief episode of palpitations the night before.  Walks 2 miles a day.  No chest pain or dyspnea.  Has some DOE with walking.  When she was last seen in April she had occasional palpitations which were associated with transient lightheadedness.  Otherwise asymptomatic.  Today, she presented with mitral valve prolapse with episodes of weakness and fatigue associated with low blood pressure. She was referred by Dr. Bluford for further evaluation of her cardiac symptoms.  She experiences weakness and fatigue during morning walks, often needing to rest after one mile due to extreme weakness. Her blood pressure was below 100 on these occasions. Since switching from atenolol  to metoprolol , she feels better but still fatigues with activities like changing bed linens or vacuuming.  Cardiac monitoring revealed episodes of supraventricular tachycardia and ventricular tachycardia, with one episode lasting 17 seconds. An echocardiogram showed a pump function of 55-60% and a mild mitral valve leak. She denies dizziness or syncope but feels breathless and requires rest after exertion. She has not walked in four weeks and is concerned about resuming.  Her heart rate, monitored via a watch, occasionally reaches 144 bpm during walks. Blood pressure has not fallen below 100 in the past two weeks, typically ranging from 104-110. She avoids caffeine and decongestants to prevent symptom exacerbation. Allergic to ragweed, she uses Allegra  as needed.  Reports no shortness of breath nor dyspnea on exertion. Reports no chest pain, pressure, or tightness. No edema, orthopnea, PND.    ROS: pertinent ROS in HPI  Studies Reviewed     Cardiac Monitor 11/30/23 Patch Wear Time:  12 days and 14 hours (2025-08-02T16:09:20-0400 to 2025-08-15T06:56:53-0400)   HR 60 - 203, average 85 bpm. 14 nonsustained SVT (longest 17.6 seconds) and 18 nonsustained VT (longest 11.9 seconds). No atrial fibrillation detected. Rare supraventricular ectopy. Rare ventricular ectopy. No sustained arrhythmias. Symptom trigger episodes correspond to NSVT, sinus, ectopy.   Cardiology referral placed based on protocol.   Fonda Kitty Cardiac Electrophysiology     Physical Exam VS:  BP (!) 118/54 (BP Location: Right Arm, Patient Position: Sitting, Cuff Size: Normal)   Pulse 72   Ht 5' 2 (1.575 m)   Wt 133 lb (60.3 kg)   SpO2 99%   BMI 24.33 kg/m        Wt Readings from Last 3 Encounters:  01/03/24 133 lb (60.3 kg)  12/21/23 132 lb (59.9 kg)  11/30/23 132 lb 6.4 oz (60.1 kg)    GEN: Well nourished, well developed in no acute distress NECK: No JVD; No carotid bruits CARDIAC: RRR, no murmurs, rubs, gallops RESPIRATORY:  Clear to auscultation without rales, wheezing or rhonchi  ABDOMEN: Soft, non-tender, non-distended EXTREMITIES:  No edema; No deformity   ASSESSMENT AND PLAN  Non-sustained ventricular and supraventricular tachycardia with palpitations and exertional fatigue Episodes of SVT and VT contribute to fatigue. Echocardiogram shows ejection fraction of 55-60%. Metoprolol  improved symptoms. No immediate procedural intervention needed. Discussed potential  use of flecainide if metoprolol  is insufficient. - Continue metoprolol . - Monitor symptoms and heart rate during exertion. - Avoid overexertion until medication regimen is optimized. - Discuss potential antiarrhythmic medications with Dr. Kennyth. - Avoid medications affecting heart  rhythm, such as Zyrtec.  Mitral valve prolapse with mild regurgitation Echocardiogram shows mild mitral valve regurgitation, not structurally concerning.  Essential hypertension Blood pressure variable, with some readings below 100 mmHg. Target systolic 130-140 mmHg to prevent symptoms and falls. Metoprolol  may lower blood pressure. - Continue monitoring blood pressure daily. - Adjust medication regimen as needed based on blood pressure readings and symptoms.  Mixed hyperlipidemia Cholesterol levels not recently checked. Last lipid panel missed. - Order fasting lipid panel.      Dispo: She can follow-up asap with Dr. Kennyth and Dr. Jeffrie in 5 months  Signed, Orren LOISE Fabry, PA-C

## 2024-01-03 ENCOUNTER — Encounter: Payer: Self-pay | Admitting: Physician Assistant

## 2024-01-03 ENCOUNTER — Ambulatory Visit: Attending: Physician Assistant | Admitting: Physician Assistant

## 2024-01-03 VITALS — BP 118/54 | HR 72 | Ht 62.0 in | Wt 133.0 lb

## 2024-01-03 DIAGNOSIS — E782 Mixed hyperlipidemia: Secondary | ICD-10-CM

## 2024-01-03 DIAGNOSIS — R002 Palpitations: Secondary | ICD-10-CM | POA: Diagnosis not present

## 2024-01-03 DIAGNOSIS — I10 Essential (primary) hypertension: Secondary | ICD-10-CM | POA: Diagnosis not present

## 2024-01-03 DIAGNOSIS — I341 Nonrheumatic mitral (valve) prolapse: Secondary | ICD-10-CM | POA: Diagnosis not present

## 2024-01-03 DIAGNOSIS — I4729 Other ventricular tachycardia: Secondary | ICD-10-CM

## 2024-01-03 NOTE — Patient Instructions (Signed)
 Medication Instructions:  Your physician recommends that you continue on your current medications as directed. Please refer to the Current Medication list given to you today.  *If you need a refill on your cardiac medications before your next appointment, please call your pharmacy*  Lab Work: NONE If you have labs (blood work) drawn today and your tests are completely normal, you will receive your results only by: MyChart Message (if you have MyChart) OR A paper copy in the mail If you have any lab test that is abnormal or we need to change your treatment, we will call you to review the results.  Testing/Procedures: NONE  Follow-Up: At [facility], you and your health needs are our priority.  As part of our continuing mission to provide you with exceptional heart care, our providers are all part of one team.  This team includes your primary Cardiologist (physician) and Advanced Practice Providers or APPs (Physician Assistants and Nurse Practitioners) who all work together to provide you with the care you need, when you need it.  Your next appointment:   5 month(s)  Provider:   DR. MARK SKAINS   We recommend signing up for the patient portal called MyChart.  Sign up information is provided on this After Visit Summary.  MyChart is used to connect with patients for Virtual Visits (Telemedicine).  Patients are able to view lab/test results, encounter notes, upcoming appointments, etc.  Non-urgent messages can be sent to your provider as well.   To learn more about what you can do with MyChart, go to [email or web address].   Other Instructions Please check your blood pressure 1-2 times per day for 2 weeks and bring to your next appointment

## 2024-01-13 ENCOUNTER — Encounter: Payer: Self-pay | Admitting: Cardiology

## 2024-01-13 ENCOUNTER — Ambulatory Visit: Attending: Cardiology | Admitting: Cardiology

## 2024-01-13 VITALS — BP 158/82 | HR 68 | Ht 62.0 in | Wt 131.0 lb

## 2024-01-13 DIAGNOSIS — R0609 Other forms of dyspnea: Secondary | ICD-10-CM

## 2024-01-13 DIAGNOSIS — R002 Palpitations: Secondary | ICD-10-CM | POA: Diagnosis not present

## 2024-01-13 DIAGNOSIS — I471 Supraventricular tachycardia, unspecified: Secondary | ICD-10-CM

## 2024-01-13 DIAGNOSIS — I341 Nonrheumatic mitral (valve) prolapse: Secondary | ICD-10-CM | POA: Diagnosis not present

## 2024-01-13 DIAGNOSIS — I4729 Other ventricular tachycardia: Secondary | ICD-10-CM

## 2024-01-13 MED ORDER — METOPROLOL SUCCINATE ER 25 MG PO TB24: 25.0000 mg | ORAL_TABLET | Freq: Two times a day (BID) | ORAL | 1 refills | Status: DC

## 2024-01-13 NOTE — Patient Instructions (Addendum)
 Medication Instructions:  Your physician has recommended you make the following change in your medication:   ** Increase Metoprolol  Succinate 25mg  - 1 tablet by mouth twice daily  *If you need a refill on your cardiac medications before your next appointment, please call your pharmacy*  Lab Work: None ordered.  If you have labs (blood work) drawn today and your tests are completely normal, you will receive your results only by: MyChart Message (if you have MyChart) OR A paper copy in the mail If you have any lab test that is abnormal or we need to change your treatment, we will call you to review the results.  Testing/Procedures: Your physician has requested that you have a lexiscan myoview . For further information please visit [email or web address] Please follow instruction sheet, as given.   Follow-Up: At [facility], you and your health needs are our priority.  As part of our continuing mission to provide you with exceptional heart care, our providers are all part of one team.  This team includes your primary Cardiologist (physician) and Advanced Practice Providers or APPs (Physician Assistants and Nurse Practitioners) who all work together to provide you with the care you need, when you need it.  Your next appointment:   2 months with Dr Shaune APP  We recommend signing up for the patient portal called MyChart.  Sign up information is provided on this After Visit Summary.  MyChart is used to connect with patients for Virtual Visits (Telemedicine).  Patients are able to view lab/test results, encounter notes, upcoming appointments, etc.  Non-urgent messages can be sent to your provider as well.   To learn more about what you can do with MyChart, go to [email or web address].

## 2024-01-13 NOTE — Progress Notes (Unsigned)
 Electrophysiology Office Note:   Date:  01/14/2024  ID:  [name] [name], DOB 25-Apr-1941, MRN [id number]  Primary Cardiologist: None Electrophysiologist: Fonda Kitty, MD      History of Present Illness:   [name] [name] is a 83 y.o. female with h/o palpitations, mitral valve prolapse, non-Hodgkin's lymphoma who is being seen today for palpitations  Discussed the use of AI scribe software for clinical note transcription with the patient, who gave verbal consent to proceed.  History of Present Illness [name] [name] is an 83 year old female with mitral valve prolapse who presents with heart rhythm abnormalities. She initially had ZIO monitor ordered by her PCP, Dr. Bluford, for evaluation of heart rhythm abnormalities.  She experiences palpitations described as 'hiccups' or a sensation of her heart 'flipping' or 'fluttering'. These episodes have been ongoing for many years. These sensations sometimes occur when she is at rest, particularly in the morning.  She was previously on atenolol  for approximately 40 years to manage palpitations but was recently switched to metoprolol . She feels better on metoprolol , although she still experiences occasional palpitations. She is currently on a low dose of metoprolol , 25 mg once daily.  She describes a history of low blood pressure, with readings as low as 90/45 mmHg, which prompted her initial visit to her family doctor. This was associated with significant fatigue and weakness, particularly after physical exertion. She was taken off atenolol  due to these symptoms and has since noticed some improvement.  She has a history of a sciatic hip, for which she was previously on prednisone . She questions whether prednisone  may have affected her blood pressure, noting that her blood pressure readings have been more stable since discontinuing the medication.  She is able to walk two miles daily but experiences significant fatigue with activities such as  changing bed linens or walking uphill. She manages her daily activities by pacing herself, completing one task per day to avoid exhaustion. No dizziness, lightheadedness, or syncope, even when her blood pressure was low.   Review of systems complete and found to be negative unless listed in HPI.   EP Information / Studies Reviewed:    EKG is ordered today. Personal review as below.  EKG Interpretation Date/Time:  Friday January 13 2024 15:19:29 EDT Ventricular Rate:  65 PR Interval:  188 QRS Duration:  80 QT Interval:  412 QTC Calculation: 428 R Axis:   -11  Text Interpretation: Normal sinus rhythm Left ventricular hypertrophy with repolarization abnormality ( R in aVL ) When compared with ECG of 05-Aug-2023 13:44, No significant change was found Confirmed by Kitty Fonda [phone]) on 01/14/2024 10:10:18 AM   Zio 12/21/23:  Patch Wear Time:  12 days and 14 hours (2025-08-02T16:09:20-0400 to 2025-08-15T06:56:53-0400)   HR 60 - 203, average 85 bpm. 14 nonsustained SVT (longest 17.6 seconds) and 18 nonsustained VT (longest 11.9 seconds). No atrial fibrillation detected. Rare supraventricular ectopy. Rare ventricular ectopy. No sustained arrhythmias. Symptom trigger episodes correspond to NSVT, sinus, ectopy.  Echo 12/16/23:   1. Left ventricular ejection fraction, by estimation, is 55 to 60%. The  left ventricle has normal function. The left ventricle has no regional  wall motion abnormalities. Left ventricular diastolic parameters are  indeterminate.   2. Right ventricular systolic function is normal. The right ventricular  size is normal.   3. The mitral valve is degenerative. Mild mitral valve regurgitation.   4. The aortic valve is tricuspid. Aortic valve regurgitation is not  visualized.   5. The inferior  vena cava is normal in size with greater than 50%  respiratory variability, suggesting right atrial pressure of 3 mmHg.   Cardiac MRI 03/2028:  IMPRESSION: 1. Normal  left ventricular size, thickness and systolic function (LVEF =65%). There is paradoxical septal motion. There is no late gadolinium enhancement in the left ventricular myocardium.   2. Normal right ventricular size, thickness and systolic function (LVEF = 63%). There are no regional wall motion abnormalities.   3.  Normal left and right atrial size.   4. Normal size of the aortic root, ascending aorta and pulmonary artery.   5. Anterior mitral valve leaflet prolapse with mitral regurgitation. Mild tricuspid regurgitation.   6.  Normal pericardium.  No pericardial effusion.   Normal cardiac MRI without evidence for inflammatory cardiomyopathy or ARVC.   Physical Exam:   VS:  BP (!) 158/82 (BP Location: Left Arm, Patient Position: Sitting, Cuff Size: Normal)   Pulse 68   Ht 5' 2 (1.575 m)   Wt 131 lb (59.4 kg)   SpO2 95%   BMI 23.96 kg/m    Wt Readings from Last 3 Encounters:  01/13/24 131 lb (59.4 kg)  01/03/24 133 lb (60.3 kg)  12/21/23 132 lb (59.9 kg)     GEN: Well nourished, well developed in no acute distress NECK: No JVD CARDIAC: Normal rate, regular rhythm RESPIRATORY:  Clear to auscultation without rales, wheezing or rhonchi  ABDOMEN: Soft, non-distended EXTREMITIES:  No edema; No deformity   ASSESSMENT AND PLAN:    #. NSVT / PVCs: I suspect this is related to her mitral valve prolapse.  She reports palpitations for 40+ years, previously on atenolol  for much of this time.  NSVT and PVCs noted on cardiac monitoring dating back to 2017. #. PSVT #. Palpitations -Increase metoprolol  to 25 mg twice daily. -If symptoms worsen and nuclear stress test is normal, then can start flecainide 50 mg twice daily.  #.  Dyspnea on exertion: -We will order a nuclear stress test to evaluate for ischemia as etiology for her dyspnea on exertion.  This will also be necessary to rule out ischemia or presence of scar in the event patient needs flecainide in the future.  #.   Mitral valve prolapse: Anterior leaflet prolapse with mild MR first noted on MRI from 2015.  MR remains mild on most recent echocardiogram in August 2025.  Appears well compensated on exam today. - Continue regular follow-up with general cardiology.  Follow up with EP APP 2 months   Signed, Fonda Kitty, MD

## 2024-01-17 ENCOUNTER — Other Ambulatory Visit: Payer: Self-pay | Admitting: Cardiology

## 2024-01-17 DIAGNOSIS — R002 Palpitations: Secondary | ICD-10-CM

## 2024-01-17 DIAGNOSIS — I4729 Other ventricular tachycardia: Secondary | ICD-10-CM

## 2024-01-18 ENCOUNTER — Telehealth (HOSPITAL_COMMUNITY): Payer: Self-pay | Admitting: *Deleted

## 2024-01-18 NOTE — Telephone Encounter (Signed)
 Patient given detailed instructions per Myocardial Perfusion Study Information Sheet for the test on 01/23/24  Patient notified to arrive 15 minutes early and that it is imperative to arrive on time for appointment to keep from having the test rescheduled.  If you need to cancel or reschedule your appointment, please call the office within 24 hours of your appointment. . Patient verbalized understanding.Claudene Ronal Quale, RN

## 2024-01-23 ENCOUNTER — Ambulatory Visit (HOSPITAL_COMMUNITY)
Admission: RE | Admit: 2024-01-23 | Discharge: 2024-01-23 | Disposition: A | Discharge status: Home / Self Care | Source: Ambulatory Visit | Attending: Cardiology | Admitting: Cardiology

## 2024-01-23 DIAGNOSIS — I2510 Atherosclerotic heart disease of native coronary artery without angina pectoris: Secondary | ICD-10-CM | POA: Diagnosis not present

## 2024-01-23 DIAGNOSIS — R002 Palpitations: Secondary | ICD-10-CM | POA: Insufficient documentation

## 2024-01-23 DIAGNOSIS — R0989 Other specified symptoms and signs involving the circulatory and respiratory systems: Secondary | ICD-10-CM | POA: Diagnosis not present

## 2024-01-23 DIAGNOSIS — R931 Abnormal findings on diagnostic imaging of heart and coronary circulation: Secondary | ICD-10-CM | POA: Insufficient documentation

## 2024-01-23 DIAGNOSIS — K449 Diaphragmatic hernia without obstruction or gangrene: Secondary | ICD-10-CM | POA: Diagnosis not present

## 2024-01-23 DIAGNOSIS — Z8572 Personal history of non-Hodgkin lymphomas: Secondary | ICD-10-CM | POA: Insufficient documentation

## 2024-01-23 DIAGNOSIS — I4729 Other ventricular tachycardia: Secondary | ICD-10-CM | POA: Diagnosis not present

## 2024-01-23 DIAGNOSIS — J9811 Atelectasis: Secondary | ICD-10-CM | POA: Diagnosis not present

## 2024-01-23 LAB — MYOCARDIAL PERFUSION IMAGING
LV dias vol: 64 mL (ref 46–106)
LV sys vol: 24 mL (ref 3.8–5.2)
Nuc Stress EF: 63 %
Peak HR: 106 {beats}/min
Rest HR: 64 {beats}/min
Rest Nuclear Isotope Dose: 10.6 mCi
SDS: 1
SRS: 6
SSS: 5
ST Depression (mm): 0 mm
Stress Nuclear Isotope Dose: 32.5 mCi
TID: 1.21

## 2024-01-23 MED ORDER — TECHNETIUM TC 99M TETROFOSMIN IV KIT
10.6000 | PACK | Freq: Once | INTRAVENOUS | Status: AC | PRN
  Administered 2024-01-23: 10.6 via INTRAVENOUS

## 2024-01-23 MED ORDER — TECHNETIUM TC 99M TETROFOSMIN IV KIT
32.5000 | PACK | Freq: Once | INTRAVENOUS | Status: AC | PRN
  Administered 2024-01-23: 32.5 via INTRAVENOUS

## 2024-01-23 MED ORDER — REGADENOSON 0.4 MG/5ML IV SOLN
INTRAVENOUS | Status: AC
  Filled 2024-01-23: qty 5

## 2024-01-23 MED ORDER — REGADENOSON 0.4 MG/5ML IV SOLN
0.4000 mg | Freq: Once | INTRAVENOUS | Status: AC
  Administered 2024-01-23: 0.4 mg via INTRAVENOUS

## 2024-02-03 ENCOUNTER — Telehealth: Payer: Self-pay | Admitting: Cardiology

## 2024-02-03 NOTE — Telephone Encounter (Signed)
 Pt is requesting a callback regarding her wanting to know her results. Please advise

## 2024-02-03 NOTE — Telephone Encounter (Signed)
 Spoke with the patient and advised that Dr. Kennyth has not reviewed her stress test yet but I will send him a message and give her a call back with recommendations.  Per office note:  -If symptoms worsen and nuclear stress test is normal, then can start flecainide 50 mg twice daily.    Patient also reports that ever since her metoprolol  was increased to 25 mg twice daily she has had trouble sleeping. She states that she still does have palpitations although they have improved. She reprots that her blood pressure overall has been good, however whenever she gets back from her 2 mile walk in the morning her blood pressure is low with systolics in the 90s. She feels very tired when it is this low. She states that she takes her medication at [facility] and goes for her walk at 7am.

## 2024-02-08 MED ORDER — FLECAINIDE ACETATE 50 MG PO TABS: 50.0000 mg | ORAL_TABLET | Freq: Two times a day (BID) | ORAL | 3 refills | Status: DC

## 2024-02-08 NOTE — Telephone Encounter (Signed)
 Spoke with patient and shared response from Dr. Kennyth:  Stress test was normal. If she is still having frequent palpitations then can start flecainide 50mg  twice daily. Will need ECG ~7 days after starting.    Josh  Rx for flecainide 50 mg BID sent to [facility] pharmacy. Pt states she will start this medication when she returns from her trip around 02/21/24. Will forward to Dr. Shaune nurse to follow-up with patient to arrange nurse visit for EKG 7 days after starting flecainide.

## 2024-02-08 NOTE — Telephone Encounter (Signed)
 Pt calling back for results. She states she is eager because she is going out of town for a week. Informed her they have not been reviewed yet but they will call back as soon as they are. I told her I would forward another message back as well.

## 2024-02-13 NOTE — Telephone Encounter (Signed)
Left message for patient to call back to schedule a nurse visit.

## 2024-02-14 NOTE — Telephone Encounter (Signed)
Pt returning call,  Please advise

## 2024-02-14 NOTE — Telephone Encounter (Signed)
 Spoke with the patient and scheduled her for a nurse visit on 10/28.

## 2024-02-20 ENCOUNTER — Ambulatory Visit: Payer: Self-pay

## 2024-02-20 NOTE — Telephone Encounter (Signed)
 Appointment scheduled.

## 2024-02-20 NOTE — Telephone Encounter (Signed)
 FYI Only or Action Required?: FYI only for provider.  Patient was last seen in primary care on 12/21/2023 by Cook, Jayce G, DO.  Called Nurse Triage reporting Leg Swelling.  Symptoms began several days ago.  Interventions attempted: Rest, hydration, or home remedies.  Symptoms are: gradually worsening.  Triage Disposition: See Physician Within 24 Hours  Patient/caregiver understands and will follow disposition?: Yes       Copied from CRM #[id number]. Topic: Clinical - Red Word Triage >> Feb 20, 2024  8:56 AM Lonell PEDLAR wrote: Red Word that prompted transfer to Nurse Triage: Increased swelling in legs/ankles. ' able to make a dent in legs' Reason for Disposition  [1] MODERATE leg swelling (e.g., swelling extends up to knees) AND [2] new-onset or getting worse  Answer Assessment - Initial Assessment Questions 1. ONSET: When did the swelling start? (e.g., minutes, hours, days)     X 2 weeks ago, worsening since beach trip 2. LOCATION: What part of the leg is swollen?  Are both legs swollen or just one leg?     BLE, ankles, feet 3. SEVERITY: How bad is the swelling? (e.g., localized; mild, moderate, severe)     Pitting 4. REDNESS: Is there redness or signs of infection?     None 5. PAIN: Is the swelling painful to touch? If Yes, ask: How painful is it?   (Scale 1-10; mild, moderate or severe)     None 6. FEVER: Do you have a fever? If Yes, ask: What is it, how was it measured, and when did it start?      None 7. CAUSE: What do you think is causing the leg swelling?     Unknown 9. RECURRENT SYMPTOM: Have you had leg swelling before? If Yes, ask: When was the last time? What happened that time?     No, started a new medication 10. OTHER SYMPTOMS: Do you have any other symptoms? (e.g., chest pain, difficulty breathing)       None  Protocols used: Leg Swelling and Edema-A-AH

## 2024-02-21 ENCOUNTER — Ambulatory Visit: Payer: Self-pay | Admitting: Family Medicine

## 2024-02-21 ENCOUNTER — Ambulatory Visit (HOSPITAL_COMMUNITY)
Admission: RE | Admit: 2024-02-21 | Discharge: 2024-02-21 | Disposition: A | Discharge status: Home / Self Care | Source: Ambulatory Visit | Attending: Family Medicine | Admitting: Family Medicine

## 2024-02-21 VITALS — BP 145/80 | HR 74 | Temp 97.2°F | Ht 62.0 in | Wt 137.0 lb

## 2024-02-21 DIAGNOSIS — M4804 Spinal stenosis, thoracic region: Secondary | ICD-10-CM | POA: Diagnosis not present

## 2024-02-21 DIAGNOSIS — G8929 Other chronic pain: Secondary | ICD-10-CM | POA: Diagnosis not present

## 2024-02-21 DIAGNOSIS — M40205 Unspecified kyphosis, thoracolumbar region: Secondary | ICD-10-CM | POA: Diagnosis not present

## 2024-02-21 DIAGNOSIS — M546 Pain in thoracic spine: Secondary | ICD-10-CM | POA: Insufficient documentation

## 2024-02-21 DIAGNOSIS — R600 Localized edema: Secondary | ICD-10-CM | POA: Insufficient documentation

## 2024-02-21 DIAGNOSIS — M549 Dorsalgia, unspecified: Secondary | ICD-10-CM | POA: Diagnosis not present

## 2024-02-21 MED ORDER — BACLOFEN 10 MG PO TABS: 5.0000 mg | ORAL_TABLET | Freq: Three times a day (TID) | ORAL | 0 refills | Status: AC | PRN

## 2024-02-21 MED ORDER — TRIAMCINOLONE ACETONIDE 0.1 % EX CREA: 1.0000 | TOPICAL_CREAM | Freq: Two times a day (BID) | CUTANEOUS | 0 refills | Status: DC | PRN

## 2024-02-21 NOTE — Assessment & Plan Note (Signed)
 None on exam today.  Could be related to metoprolol  and recent increase.  Advised to decrease back down to 25 mg daily.  Elevate legs.  Topical triamcinolone  as needed for dermatitis of the lower extremities.

## 2024-02-21 NOTE — Assessment & Plan Note (Signed)
 I believe that this is most likely secondary to spasm around the scapula.  Obtaining thoracic spine x-ray for further evaluation.  Baclofen  as directed.

## 2024-02-21 NOTE — Patient Instructions (Addendum)
 Decrease metoprolol  to 25 mg once daily.  X-ray at the hospital.  Medication as needed for the pain in your back.

## 2024-02-21 NOTE — Progress Notes (Signed)
 Subjective:  Patient ID: [name] [name], female    DOB: November 23, 1940  Age: 83 y.o. MRN: [id number]  CC:   Chief Complaint  Patient presents with   ankle and foot swelling    2. 5 weeks ago -Off atenolol  and started metoprolol  and tambocor    HPI:  83 year old female presents for evaluation of the above.  Patient reports that over the past 2 weeks she has had swelling in her lower extremities.  She reports that she has had venous stasis dermatitis as well.  She has been applying some topical cream without significant resolution.  She is very concerned about the swelling in her lower extremities.  Metoprolol  recently increased by cardiology.  No chest pain.  She has had some dyspnea exertion but has been evaluated by cardiology.  Additionally, patient reports some upper back pain particular around the scapula.  Severe at times.  Heat seems to help.  No recent fall, trauma, injury.  Patient Active Problem List   Diagnosis Date Noted   Acute bilateral thoracic back pain 02/21/2024   Lower extremity edema 02/21/2024   Ventricular tachycardia (HCC) 12/22/2023   Hypotension 11/30/2023   Sleep disturbance 11/07/2023   Hyperparathyroidism, primary 12/17/2021   Age-related osteoporosis without current pathological fracture 12/17/2021   Peripheral neuropathy 08/26/2016   Dyssynergic defecation 12/31/2015   NSVT (nonsustained ventricular tachycardia) (HCC) 11/27/2013   Allergic rhinitis 08/20/2013   Hyperlipidemia 08/27/2008   MVP (mitral valve prolapse) 08/27/2008    IBS- diarrhea predominant 10/13/2007   NHL (non-Hodgkin's lymphoma) (HCC) 06/16/2007    Social Hx   Social History   Socioeconomic History   Marital status: Widowed    Spouse name: Not on file   Number of children: 1   Years of education: Not on file   Highest education level: 12th grade  Occupational History   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  Tobacco Use   Smoking status: Former     Current packs/day: 0.33    Types: Cigarettes    Passive exposure: Never   Smokeless tobacco: Never   Tobacco comments:    quit 1976  Vaping Use   Vaping status: Never Used  Substance and Sexual Activity   Alcohol use: Not Currently    Comment: very rare   Drug use: No   Sexual activity: Yes    Birth control/protection: Surgical  Other Topics Concern   Not on file  Social History Narrative   Retired, widowed, 1 child   Rare EtOH, former smoker none now   No drugs   Right handed   Social Drivers of Corporate investment banker Strain: Low Risk  (10/31/2023)   Overall Financial Resource Strain (CARDIA)    Difficulty of Paying Living Expenses: Not hard at all  Food Insecurity: No Food Insecurity (10/31/2023)   Hunger Vital Sign    Worried About Running Out of Food in the Last Year: Never true    Ran Out of Food in the Last Year: Never true  Transportation Needs: No Transportation Needs (10/31/2023)   PRAPARE - Administrator, Civil Service (Medical): No    Lack of Transportation (Non-Medical): No  Physical Activity: Sufficiently Active (10/31/2023)   Exercise Vital Sign    Days of Exercise per Week: 5 days    Minutes of Exercise per Session: 60 min  Stress: No Stress Concern Present (10/31/2023)   [employer] of Occupational Health - Occupational Stress Questionnaire    Feeling  of Stress: Not at all  Social Connections: Moderately Isolated (10/31/2023)   Social Connection and Isolation Panel    Frequency of Communication with Friends and Family: More than three times a week    Frequency of Social Gatherings with Friends and Family: Three times a week    Attends Religious Services: More than 4 times per year    Active Member of Clubs or Organizations: No    Attends Banker Meetings: Not on file    Marital Status: Widowed    Review of Systems Per HPI  Objective:  BP (!) 145/80   Pulse 74   Temp (!) 97.2 F (36.2 C)   Ht 5' 2 (1.575 m)    Wt 137 lb (62.1 kg)   SpO2 98%   BMI 25.06 kg/m      02/21/2024    9:45 AM 01/13/2024    3:13 PM 01/03/2024    1:22 PM  BP/Weight  Systolic BP 145 158 118  Diastolic BP 80 82 54  Wt. (Lbs) 137 131 133  BMI 25.06 kg/m2 23.96 kg/m2 24.33 kg/m2    Physical Exam Vitals and nursing note reviewed.  Constitutional:      General: She is not in acute distress.    Appearance: Normal appearance.  HENT:     Head: Normocephalic and atraumatic.  Cardiovascular:     Rate and Rhythm: Normal rate and regular rhythm.     Comments: Patient has no lower extremity edema today. Pulmonary:     Effort: Pulmonary effort is normal.     Breath sounds: No wheezing.  Musculoskeletal:     Comments: No tenderness of the thoracic spine to palpation.  Neurological:     Mental Status: She is alert.     Lab Results  Component Value Date   WBC 9.3 11/17/2023   HGB 12.6 11/17/2023   HCT 38.5 11/17/2023   PLT 251 11/17/2023   GLUCOSE 90 11/17/2023   CHOL 178 01/18/2023   TRIG 179 (H) 01/18/2023   HDL 51 01/18/2023   LDLCALC 96 01/18/2023   ALT 16 11/17/2023   AST 22 11/17/2023   NA 139 11/17/2023   K 4.9 11/17/2023   CL 103 11/17/2023   CREATININE 1.00 11/17/2023   BUN 27 (H) 11/17/2023   CO2 25 11/17/2023   TSH 2.580 11/17/2023   INR 0.94 06/16/2018   HGBA1C 5.9 (H) 08/24/2023     Assessment & Plan:  Lower extremity edema Assessment & Plan: None on exam today.  Could be related to metoprolol  and recent increase.  Advised to decrease back down to 25 mg daily.  Elevate legs.  Topical triamcinolone  as needed for dermatitis of the lower extremities.   Acute bilateral thoracic back pain Assessment & Plan: I believe that this is most likely secondary to spasm around the scapula.  Obtaining thoracic spine x-ray for further evaluation.  Baclofen  as directed.  Orders: -     DG Thoracic Spine W/Swimmers  Other orders -     Baclofen ; Take 0.5-1 tablets (5-10 mg total) by mouth 3 (three)  times daily as needed for muscle spasms.  Dispense: 30 each; Refill: 0 -     Triamcinolone  Acetonide; Apply 1 Application topically 2 (two) times daily as needed.  Dispense: 30 g; Refill: 0    Follow-up:  Return if symptoms worsen or fail to improve.  Jacqulyn Ahle DO [facility]

## 2024-02-23 ENCOUNTER — Encounter: Payer: Self-pay | Admitting: Cardiology

## 2024-02-23 ENCOUNTER — Telehealth: Payer: Self-pay | Admitting: Cardiology

## 2024-02-23 NOTE — Telephone Encounter (Signed)
 Pt c/o medication issue:  1. Name of Medication: flecainide (TAMBOCOR) 50 MG tablet   2. How are you currently taking this medication (dosage and times per day)? As written  3. Are you having a reaction (difficulty breathing--STAT)? No   4. What is your medication issue? Pt states since she started this medication she's been getting a error message on her Apple watch that she doesn't understand. Pt doesn't have any symptoms.

## 2024-02-23 NOTE — Telephone Encounter (Signed)
 Spoke with pt, she started flecainide on Tuesday and yesterday and today, when she did the ECG on her watch, they have all been inconclusive. Last night she took 1 flecainide instead of 2.  She may feel some quivering of the heart but is not sure she is actually feeling that. She is going to try to send us  the strips from the ECG's. She has an appointment next week for ECG on 02/28/24. She would like dr kennyth know what is going on as this never happened until she started the flecainide.

## 2024-02-27 DIAGNOSIS — G245 Blepharospasm: Secondary | ICD-10-CM | POA: Diagnosis not present

## 2024-02-28 ENCOUNTER — Ambulatory Visit

## 2024-02-28 NOTE — Telephone Encounter (Signed)
 Spoke with patient. She only took flecainide for ~2 days due to perceived worsening of palpitations on this medication. She states that she felt terrible and that it felt like her heart was beating out of her chest. Symptoms resolved after she discontinued flecainide. Med list updated.  Pt does feel like she is tolerating Toprol  XL well and feels that this has helped with her palpitations. Explained that she should keep upcoming appointment with CHARLENA Braver, NP, on 03/19/24 and monitor symptoms in the interim. Can offer appointment with EP APP if needed, but pt doesn't feel like it is necessary at this time. She is aware that nurse visit for EKG is not necessary today. Pt expressed appreciation of call and denies questions or additional concerns at this time.

## 2024-02-28 NOTE — Telephone Encounter (Signed)
 LMOVM (DPR) requesting call back. Advised that per Dr. Kennyth, nurse visit appointment today is not necessary if she discontinued flecainide. Direct number provided for call back. Will also send a MyChart message.

## 2024-03-16 ENCOUNTER — Other Ambulatory Visit: Payer: Self-pay

## 2024-03-16 MED ORDER — PANTOPRAZOLE SODIUM 40 MG PO TBEC: 40.0000 mg | DELAYED_RELEASE_TABLET | Freq: Every day | ORAL | 11 refills | Status: AC

## 2024-03-16 NOTE — Telephone Encounter (Signed)
 Pantoprazole  refilled as pharmacy requested. Up to date on her office visits.

## 2024-03-17 ENCOUNTER — Ambulatory Visit
Admission: EM | Admit: 2024-03-17 | Discharge: 2024-03-17 | Disposition: A | Discharge status: Home / Self Care | Attending: Family Medicine | Admitting: Family Medicine

## 2024-03-17 ENCOUNTER — Other Ambulatory Visit: Payer: Self-pay

## 2024-03-17 ENCOUNTER — Encounter: Payer: Self-pay | Admitting: Emergency Medicine

## 2024-03-17 DIAGNOSIS — R1030 Lower abdominal pain, unspecified: Secondary | ICD-10-CM

## 2024-03-17 DIAGNOSIS — Z8719 Personal history of other diseases of the digestive system: Secondary | ICD-10-CM

## 2024-03-17 MED ORDER — METRONIDAZOLE 500 MG PO TABS: 500.0000 mg | ORAL_TABLET | Freq: Three times a day (TID) | ORAL | 0 refills | Status: DC

## 2024-03-17 MED ORDER — CIPROFLOXACIN HCL 500 MG PO TABS: 500.0000 mg | ORAL_TABLET | Freq: Two times a day (BID) | ORAL | 0 refills | Status: DC

## 2024-03-17 NOTE — Discharge Instructions (Signed)
 I have prescribed the antibiotic regimen to treat for diverticulitis in case this is what is causing your pain given your history.  Stay well-hydrated, follow-up with your primary care provider next week for a recheck and go to the emergency department if severely worsening at any time

## 2024-03-17 NOTE — ED Triage Notes (Signed)
 Pt reports diverticulitis flare-up since eating butter pecan on Thursday. Pt reports abdominal cramping. Denies emesis, fever. NAD noted.

## 2024-03-17 NOTE — ED Provider Notes (Signed)
 [facility]    CSN: [id number] Arrival date & time: 03/17/24  0807      History   Chief Complaint Chief Complaint  Patient presents with   Abdominal Pain    HPI [name] [name] is a 83 y.o. female.   Patient presenting today with 2-day history of progressively worsening lower abdominal cramping, pain following eating butter pecan ice cream.  States history of diverticulosis with multiple diverticulitis flareups over the years, often exacerbated by dietary intake such as nuts.  Notes this pain feels exactly like her prior early flares and she is concerned about letting it get worse.  Denies current fever, diarrhea, melena or hematochezia, urinary symptoms, nausea, vomiting.  So far not try anything over-the-counter for symptoms.    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   Manometry 12/2015   Arthritis    Brady-tachy syndrome (HCC)    Chest pain 10/16/2013   normal coronary arteries on cath and normal EF   Colitis, ischemic    Colon polyp 07/29/1993   with focal adenomatous changes   Complication of anesthesia    Diverticulitis    Diverticulosis of colon    Dyspepsia    Dyssynergic defecation 12/31/2015   Anorectal mano 12/2015, also has rectal hypersensitivity   Fatty liver    GERD (gastroesophageal reflux disease)    Heart murmur    HLD (hyperlipidemia)    Hyperparathyroidism    Hypertension    IBS (irritable bowel syndrome)    Incontinence, feces    MVP (mitral valve prolapse)    nhl dx'd 07/1999/ 06/2018   chemo comp 2001; rituxin comp 2005   Osteopenia    Palpitation    PONV (postoperative nausea and vomiting)    Rectocele    Vasovagal syncope     Patient Active Problem List   Diagnosis Date Noted   Acute bilateral thoracic back pain 02/21/2024   Lower extremity edema 02/21/2024   Ventricular tachycardia (HCC) 12/22/2023   Hypotension 11/30/2023   Sleep disturbance 11/07/2023   Hyperparathyroidism,  primary 12/17/2021   Age-related osteoporosis without current pathological fracture 12/17/2021   Peripheral neuropathy 08/26/2016   Dyssynergic defecation 12/31/2015   NSVT (nonsustained ventricular tachycardia) (HCC) 11/27/2013   Allergic rhinitis 08/20/2013   Hyperlipidemia 08/27/2008   MVP (mitral valve prolapse) 08/27/2008    IBS- diarrhea predominant 10/13/2007   NHL (non-Hodgkin's lymphoma) (HCC) 06/16/2007    Past Surgical History:  Procedure Laterality Date   ABDOMINAL HYSTERECTOMY     ABDOMINAL SURGERY  2001   for non hodgkins lymphoma; lymphoma in mesentary   ANAL RECTAL MANOMETRY N/A 12/24/2015   Procedure: ANO RECTAL MANOMETRY;  Surgeon: Lupita FORBES Commander, MD;  Location: [facility];  Service: Endoscopy;  Laterality: N/A;   BLADDER SUSPENSION     BUNIONECTOMY     CARDIAC CATHETERIZATION     CATARACT EXTRACTION     10/2023   CHOLECYSTECTOMY  2003   COLONOSCOPY  7/200/, 11/2007   diverticulosis   FINE NEEDLE ASPIRATION BIOPSY Left 06/16/2018   left mesentery tissue   LEFT HEART CATHETERIZATION WITH CORONARY ANGIOGRAM N/A 10/16/2013   Procedure: LEFT HEART CATHETERIZATION WITH CORONARY ANGIOGRAM;  Surgeon: Peter M Jordan, MD;  Location: [facility] CATH LAB;  Service: Cardiovascular;  Laterality: N/A;   PARATHYROIDECTOMY Right 05/30/2023   Procedure: RIGHT INFERIOR PARATHYROIDECTOMY;  Surgeon: Eletha Boas, MD;  Location: [facility];  Service: General;  Laterality: Right;   pelvic prolapse repair     TONSILLECTOMY  UPPER GASTROINTESTINAL ENDOSCOPY  10/2005   hiatal hernia    OB History   No obstetric history on file.      Home Medications    Prior to Admission medications   Medication Sig Start Date End Date Taking? Authorizing Provider  ciprofloxacin  (CIPRO ) 500 MG tablet Take 1 tablet (500 mg total) by mouth 2 (two) times daily. 03/17/24  Yes Stuart Vernell Norris, PA-C  metroNIDAZOLE  (FLAGYL ) 500 MG tablet Take 1 tablet (500 mg total) by mouth 3 (three) times daily.  03/17/24  Yes Stuart Vernell Norris, PA-C  ALPRAZolam  (XANAX ) 0.25 MG tablet Take one tab po at bedtime prn 11/07/23   Hoskins, Carolyn C, NP  baclofen  (LIORESAL ) 10 MG tablet Take 0.5-1 tablets (5-10 mg total) by mouth 3 (three) times daily as needed for muscle spasms. 02/21/24   Cook, Jayce G, DO  BOTOX 100 units SOLR injection Inject 100 Units into the muscle. 04/06/23   [provider]  dicyclomine  (BENTYL ) 20 MG tablet Take 1 tablet (20 mg total) by mouth every 6 (six) hours as needed (IBS). 08/25/23   Cook, Jayce G, DO  diphenoxylate -atropine  (LOMOTIL ) 2.5-0.025 MG tablet Take 1 tablet by mouth 4 (four) times daily as needed for diarrhea or loose stools. 08/31/23   Cook, Jayce G, DO  fexofenadine (ALLEGRA) 180 MG tablet Take 180 mg by mouth daily as needed for allergies.    [provider]  meclizine  (ANTIVERT ) 25 MG tablet Take 1 tablet (25 mg total) by mouth 3 (three) times daily as needed for dizziness. 01/31/23   Cook, Jayce G, DO  metoprolol  succinate (TOPROL  XL) 25 MG 24 hr tablet Take 1 tablet (25 mg total) by mouth 2 (two) times daily. 01/13/24   Kennyth Chew, MD  Multiple Vitamins-Minerals (MULTIVITAMIN GUMMIES ADULT) CHEW Chew 1 tablet by mouth daily.    [provider]  pantoprazole  (PROTONIX ) 40 MG tablet Take 1 tablet (40 mg total) by mouth daily before breakfast. 03/16/24   Avram Lupita BRAVO, MD  Polyvinyl Alcohol-Povidone PF 1.4-0.6 % SOLN Place 1 drop into both eyes 3 (three) times daily.    [provider]  Probiotic Product (ALIGN PO) Take 1 capsule by mouth daily.    [provider]  rosuvastatin  (CRESTOR ) 5 MG tablet Take 1 tablet by mouth on Mon, Wed and Fri only. 08/05/23   Nahser, Aleene PARAS, MD  triamcinolone  cream (KENALOG ) 0.1 % Apply 1 Application topically 2 (two) times daily as needed. 02/21/24   Bluford Jacqulyn MATSU, DO    Family History Family History  Problem Relation Age of Onset   Thyroid  disease Mother    Osteoporosis Mother     Colon cancer Father 49   Prostate cancer Brother    Colon polyps Brother    Colon polyps Brother    Stomach cancer Maternal Uncle    Stomach cancer Maternal Grandfather    Esophageal cancer Neg Hx    Pancreatic cancer Neg Hx    Liver disease Neg Hx    Breast cancer Neg Hx     Social History Social History   Tobacco Use   Smoking status: Former    Current packs/day: 0.33    Types: Cigarettes    Passive exposure: Never   Smokeless tobacco: Never   Tobacco comments:    quit 1976  Vaping Use   Vaping status: Never Used  Substance Use Topics   Alcohol use: Not Currently    Comment: very rare   Drug use: No  Allergies   Other, Sulfa antibiotics, Sulfonamide derivatives, Glycopyrrolate, Latex, Methscopolamine, Ace inhibitors, Codeine, Iodine, and Iohexol    Review of Systems Review of Systems Per HPI  Physical Exam Triage Vital Signs ED Triage Vitals  Encounter Vitals Group     BP 03/17/24 0822 (!) 152/84     Girls Systolic BP Percentile --      Girls Diastolic BP Percentile --      Boys Systolic BP Percentile --      Boys Diastolic BP Percentile --      Pulse Rate 03/17/24 0822 67     Resp 03/17/24 0822 20     Temp 03/17/24 0822 98.2 F (36.8 C)     Temp Source 03/17/24 0822 Oral     SpO2 03/17/24 0822 96 %     Weight --      Height --      Head Circumference --      Peak Flow --      Pain Score 03/17/24 0821 5     Pain Loc --      Pain Education --      Exclude from Growth Chart --    No data found.  Updated Vital Signs BP (!) 152/84 (BP Location: Right Arm)   Pulse 67   Temp 98.2 F (36.8 C) (Oral)   Resp 20   SpO2 96%   Visual Acuity Right Eye Distance:   Left Eye Distance:   Bilateral Distance:    Right Eye Near:   Left Eye Near:    Bilateral Near:     Physical Exam Vitals and nursing note reviewed.  Constitutional:      Appearance: Normal appearance. She is not ill-appearing.  HENT:     Head: Atraumatic.     Mouth/Throat:      Mouth: Mucous membranes are moist.  Eyes:     Extraocular Movements: Extraocular movements intact.     Conjunctiva/sclera: Conjunctivae normal.  Cardiovascular:     Rate and Rhythm: Normal rate.  Pulmonary:     Effort: Pulmonary effort is normal.  Abdominal:     General: There is no distension.     Palpations: Abdomen is soft.     Tenderness: There is abdominal tenderness. There is no right CVA tenderness, left CVA tenderness or guarding.     Comments: Bowel sounds hyperactive to the lower abdomen diffusely Lower abdominal tenderness to palpation without distention or guarding  Musculoskeletal:        General: Normal range of motion.     Cervical back: Normal range of motion and neck supple.  Skin:    General: Skin is warm and dry.  Neurological:     Mental Status: She is alert and oriented to person, place, and time.  Psychiatric:        Mood and Affect: Mood normal.        Thought Content: Thought content normal.        Judgment: Judgment normal.      UC Treatments / Results  Labs (all labs ordered are listed, but only abnormal results are displayed) Labs Reviewed - No data to display  EKG   Radiology No results found.  Procedures Procedures (including critical care time)  Medications Ordered in UC Medications - No data to display  Initial Impression / Assessment and Plan / UC Course  I have reviewed the triage vital signs and the nursing notes.  Pertinent labs & imaging results that were available during my care of the  patient were reviewed by me and considered in my medical decision making (see chart for details).     Given her history and frequent flareups that have felt similar, will start Cipro  Flagyl  regimen for possible diverticulitis.  Follow-up with primary care provider next week for a recheck and go to the emergency department for worsening or unresolving symptoms.  Final Clinical Impressions(s) / UC Diagnoses   Final diagnoses:  Lower  abdominal pain  History of diverticulitis     Discharge Instructions      I have prescribed the antibiotic regimen to treat for diverticulitis in case this is what is causing your pain given your history.  Stay well-hydrated, follow-up with your primary care provider next week for a recheck and go to the emergency department if severely worsening at any time    ED Prescriptions     Medication Sig Dispense Auth. Provider   ciprofloxacin  (CIPRO ) 500 MG tablet Take 1 tablet (500 mg total) by mouth 2 (two) times daily. 14 tablet Stuart Vernell Norris, PA-C   metroNIDAZOLE  (FLAGYL ) 500 MG tablet Take 1 tablet (500 mg total) by mouth 3 (three) times daily. 21 tablet Stuart Vernell Norris, [place]      PDMP not reviewed this encounter.   Stuart Vernell Norris, [place] 03/17/24 [phone]

## 2024-03-19 ENCOUNTER — Encounter: Payer: Self-pay | Admitting: Nurse Practitioner

## 2024-03-19 ENCOUNTER — Ambulatory Visit

## 2024-03-19 ENCOUNTER — Ambulatory Visit: Attending: Nurse Practitioner | Admitting: Nurse Practitioner

## 2024-03-19 VITALS — BP 134/70 | HR 64 | Ht 62.0 in | Wt 134.8 lb

## 2024-03-19 DIAGNOSIS — I471 Supraventricular tachycardia, unspecified: Secondary | ICD-10-CM

## 2024-03-19 DIAGNOSIS — R0609 Other forms of dyspnea: Secondary | ICD-10-CM

## 2024-03-19 DIAGNOSIS — I2510 Atherosclerotic heart disease of native coronary artery without angina pectoris: Secondary | ICD-10-CM | POA: Diagnosis not present

## 2024-03-19 DIAGNOSIS — I4729 Other ventricular tachycardia: Secondary | ICD-10-CM

## 2024-03-19 DIAGNOSIS — I341 Nonrheumatic mitral (valve) prolapse: Secondary | ICD-10-CM

## 2024-03-19 DIAGNOSIS — I340 Nonrheumatic mitral (valve) insufficiency: Secondary | ICD-10-CM

## 2024-03-19 DIAGNOSIS — E785 Hyperlipidemia, unspecified: Secondary | ICD-10-CM | POA: Diagnosis not present

## 2024-03-19 DIAGNOSIS — R002 Palpitations: Secondary | ICD-10-CM | POA: Diagnosis not present

## 2024-03-19 DIAGNOSIS — I10 Essential (primary) hypertension: Secondary | ICD-10-CM

## 2024-03-19 NOTE — Progress Notes (Unsigned)
 Office Visit    Patient Name: [name] [name] Date of Encounter: 03/19/2024  Primary Care Provider:  Cook, Jayce G, DO Primary Cardiologist:  None  Chief Complaint    83 year old female with a history of mild coronary artery calcification, NSVT, PSVT, PVCs, mitral valve prolapse with mild mitral valve regurgitation, hypertension, hyperlipidemia, hyperparathyroidism, and GERD who presents for follow-up related to palpitations.  Past Medical History    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   Manometry 12/2015   Arthritis    Brady-tachy syndrome (HCC)    Chest pain 10/16/2013   normal coronary arteries on cath and normal EF   Colitis, ischemic    Colon polyp 07/29/1993   with focal adenomatous changes   Complication of anesthesia    Diverticulitis    Diverticulosis of colon    Dyspepsia    Dyssynergic defecation 12/31/2015   Anorectal mano 12/2015, also has rectal hypersensitivity   Fatty liver    GERD (gastroesophageal reflux disease)    Heart murmur    HLD (hyperlipidemia)    Hyperparathyroidism    Hypertension    IBS (irritable bowel syndrome)    Incontinence, feces    MVP (mitral valve prolapse)    nhl dx'd 07/1999/ 06/2018   chemo comp 2001; rituxin comp 2005   Osteopenia    Palpitation    PONV (postoperative nausea and vomiting)    Rectocele    Vasovagal syncope    Past Surgical History:  Procedure Laterality Date   ABDOMINAL HYSTERECTOMY     ABDOMINAL SURGERY  2001   for non hodgkins lymphoma; lymphoma in mesentary   ANAL RECTAL MANOMETRY N/A 12/24/2015   Procedure: ANO RECTAL MANOMETRY;  Surgeon: Lupita FORBES Commander, MD;  Location: [facility];  Service: Endoscopy;  Laterality: N/A;   BLADDER SUSPENSION     BUNIONECTOMY     CARDIAC CATHETERIZATION     CATARACT EXTRACTION     10/2023   CHOLECYSTECTOMY  2003   COLONOSCOPY  7/200/, 11/2007   diverticulosis   FINE NEEDLE ASPIRATION BIOPSY Left 06/16/2018   left  mesentery tissue   LEFT HEART CATHETERIZATION WITH CORONARY ANGIOGRAM N/A 10/16/2013   Procedure: LEFT HEART CATHETERIZATION WITH CORONARY ANGIOGRAM;  Surgeon: Peter M Jordan, MD;  Location: [facility] CATH LAB;  Service: Cardiovascular;  Laterality: N/A;   PARATHYROIDECTOMY Right 05/30/2023   Procedure: RIGHT INFERIOR PARATHYROIDECTOMY;  Surgeon: Eletha Boas, MD;  Location: [facility] OR[facility];  Service: General;  Laterality: Right;   pelvic prolapse repair     TONSILLECTOMY     UPPER GASTROINTESTINAL ENDOSCOPY  10/2005   hiatal hernia    Allergies  Allergies  Allergen Reactions   Other Anaphylaxis    Other reaction(s): Facial swelling And face swelling And face swelling    Sulfa Antibiotics Swelling and Anaphylaxis    And face swelling Face swelling    Sulfonamide Derivatives Swelling    Face swelling    Glycopyrrolate Other (See Comments)    Unable to urinate Could not urinate after it was given   Latex Other (See Comments)    Blisters   Methscopolamine Other (See Comments)    Unable to urinate Unable to urinate   Ace Inhibitors Hives   Codeine Nausea Only   Iodine Hives   Iohexol  Hives     Desc: 50 mg benadryl  prior to exam      Labs/Other Studies Reviewed    The following studies were reviewed today: *** Cardiac Studies & Procedures  ______________________________________________________________________________________________   STRESS TESTS  MYOCARDIAL PERFUSION IMAGING 01/23/2024  Interpretation Summary   Fixed anteroseptal perfusion defect with normal wall motion, consistent with artifact.  Overall, low risk study   The study is normal. The study is low risk.   No ST deviation was noted.   LV perfusion is abnormal. Defect 1: There is a small defect with mild reduction in uptake present in the apical to mid anteroseptal location(s) that is fixed. There is normal wall motion in the defect area. Consistent with artifact   Left ventricular function is normal. Nuclear  stress EF: 63%. The left ventricular ejection fraction is normal (55-65%). End diastolic cavity size is normal. End systolic cavity size is normal.   CT images were obtained for attenuation correction and were examined for the presence of coronary calcium  when appropriate.   Coronary calcium  was present on the attenuation correction CT images. Mild coronary calcifications were present.   Prior study not available for comparison.   Electronically signed by Lonni Nanas, MD   ECHOCARDIOGRAM  ECHOCARDIOGRAM COMPLETE 12/16/2023  Narrative ECHOCARDIOGRAM REPORT    Patient Name:   [name] [name] Date of Exam: 12/16/2023 Medical Rec #:  [id number]         Height:       62.0 in Accession #:    [id number]        Weight:       132.4 lb Date of Birth:  09-29-1940          BSA:          1.604 m Patient Age:    82 years          BP:           145/72 mmHg Patient Gender: F                 HR:           89 bpm. Exam Location:  Outpatient  Procedure: 2D Echo, Color Doppler and Cardiac Doppler (Both Spectral and Color Flow Doppler were utilized during procedure).  Indications:    Dyspnea R06.00  History:        Patient has prior history of Echocardiogram examinations, most recent 12/04/2020.  Sonographer:    Tinnie Gosling RDCS Referring Phys: LETHIA JACQULYN MATSU COOK  IMPRESSIONS   1. Left ventricular ejection fraction, by estimation, is 55 to 60%. The left ventricle has normal function. The left ventricle has no regional wall motion abnormalities. Left ventricular diastolic parameters are indeterminate. 2. Right ventricular systolic function is normal. The right ventricular size is normal. 3. The mitral valve is degenerative. Mild mitral valve regurgitation. 4. The aortic valve is tricuspid. Aortic valve regurgitation is not visualized. 5. The inferior vena cava is normal in size with greater than 50% respiratory variability, suggesting right atrial pressure of 3 mmHg.  Comparison(s): Prior  images reviewed side by side. LVEF normal at 55-60%. Mild mitral regurgitation.  FINDINGS Left Ventricle: Left ventricular ejection fraction, by estimation, is 55 to 60%. The left ventricle has normal function. The left ventricle has no regional wall motion abnormalities. The left ventricular internal cavity size was normal in size. There is no left ventricular hypertrophy. Left ventricular diastolic parameters are indeterminate.  Right Ventricle: The right ventricular size is normal. No increase in right ventricular wall thickness. Right ventricular systolic function is normal.  Left Atrium: Left atrial size was normal in size.  Right Atrium: Right atrial size was normal in size.  Pericardium: There is  no evidence of pericardial effusion. Presence of epicardial fat layer.  Mitral Valve: The mitral valve is degenerative in appearance. There is mild calcification of the mitral valve leaflet(s). Mild mitral valve regurgitation.  Tricuspid Valve: The tricuspid valve is grossly normal. Tricuspid valve regurgitation is trivial.  Aortic Valve: The aortic valve is tricuspid. There is mild aortic valve annular calcification. Aortic valve regurgitation is not visualized.  Pulmonic Valve: The pulmonic valve was grossly normal. Pulmonic valve regurgitation is trivial.  Aorta: The aortic root and ascending aorta are structurally normal, with no evidence of dilitation.  Venous: The inferior vena cava is normal in size with greater than 50% respiratory variability, suggesting right atrial pressure of 3 mmHg.  IAS/Shunts: No atrial level shunt detected by color flow Doppler.  Additional Comments: 3D was performed not requiring image post processing on an independent workstation and was indeterminate.   LEFT VENTRICLE PLAX 2D LVIDd:         4.30 cm   Diastology LVIDs:         3.30 cm   LV e' medial:    6.53 cm/s LV PW:         0.80 cm   LV E/e' medial:  8.1 LV IVS:        0.80 cm   LV e' lateral:    8.38 cm/s LVOT diam:     1.80 cm   LV E/e' lateral: 6.3 LV SV:         37 LV SV Index:   23 LVOT Area:     2.54 cm   RIGHT VENTRICLE         IVC TAPSE (M-mode): 2.2 cm  IVC diam: 1.70 cm  LEFT ATRIUM             Index        RIGHT ATRIUM          Index LA diam:        2.80 cm 1.75 cm/m   RA Area:     8.09 cm LA Vol (A2C):   21.1 ml 13.15 ml/m  RA Volume:   13.90 ml 8.66 ml/m LA Vol (A4C):   30.2 ml 18.82 ml/m LA Biplane Vol: 25.2 ml 15.71 ml/m AORTIC VALVE LVOT Vmax:   65.50 cm/s LVOT Vmean:  43.400 cm/s LVOT VTI:    0.146 m  AORTA Ao Root diam: 2.70 cm Ao Asc diam:  2.90 cm  MITRAL VALVE MV Area (PHT): 5.16 cm    SHUNTS MV Decel Time: 147 msec    Systemic VTI:  0.15 m MV E velocity: 52.90 cm/s  Systemic Diam: 1.80 cm MV A velocity: 96.40 cm/s MV E/A ratio:  0.55  Jayson Sierras MD Electronically signed by Jayson Sierras MD Signature Date/Time: 12/16/2023/10:16:07 AM    Final    MONITORS  LONG TERM MONITOR (3-14 DAYS) 12/21/2023  Narrative Patch Wear Time:  12 days and 14 hours (2025-08-02T16:09:20-0400 to 2025-08-15T06:56:53-0400)  HR 60 - 203, average 85 bpm. 14 nonsustained SVT (longest 17.6 seconds) and 18 nonsustained VT (longest 11.9 seconds). No atrial fibrillation detected. Rare supraventricular ectopy. Rare ventricular ectopy. No sustained arrhythmias. Symptom trigger episodes correspond to NSVT, sinus, ectopy.  Cardiology referral placed based on protocol.  Fonda Kitty Cardiac Electrophysiology     CARDIAC MRI  MR CARDIAC MORPHOLOGY W WO CONTRAST 03/28/2018  Narrative CLINICAL DATA:  83 year old female with tachy-brady syndrome, mitral valve prolapse, normal cardiac catheterization and nonsustained ventricular tachycardias on cardiac monitor (up to  6 beats).  EXAM: CARDIAC MRI  TECHNIQUE: The patient was scanned on a 1.5 Tesla GE magnet. A dedicated cardiac coil was used. Functional imaging was done using  Fiesta sequences. 2,3, and 4 chamber views were done to assess for RWMA's. Modified Simpson's rule using a short axis stack was used to calculate an ejection fraction on a dedicated work [employer]. The patient received 10 cc of Gadavist . After 10 minutes inversion recovery sequences were used to assess for infiltration and scar tissue.  CONTRAST:  10 cc  of Gadavist   FINDINGS: 1. Normal left ventricular size, thickness and systolic function (LVEF =65%). There is paradoxical septal motion. There is no late gadolinium enhancement in the left ventricular myocardium.  LVEDD: 41 mm  LVESD: 26 mm  LVEDV: 74 ml  LVESV: 26 ml  SV: 49 ml  CO: 2.6 L/min  Myocardial mass: 68 g  2. Normal right ventricular size, thickness and systolic function (LVEF = 63%). There are no regional wall motion abnormalities.  3.  Normal left and right atrial size.  4. Normal size of the aortic root, ascending aorta and pulmonary artery.  5. Anterior mitral valve leaflet prolapse with mitral regurgitation. Mild tricuspid regurgitation.  6.  Normal pericardium.  No pericardial effusion.  IMPRESSION: 1. Normal left ventricular size, thickness and systolic function (LVEF =65%). There is paradoxical septal motion. There is no late gadolinium enhancement in the left ventricular myocardium.  2. Normal right ventricular size, thickness and systolic function (LVEF = 63%). There are no regional wall motion abnormalities.  3.  Normal left and right atrial size.  4. Normal size of the aortic root, ascending aorta and pulmonary artery.  5. Anterior mitral valve leaflet prolapse with mitral regurgitation. Mild tricuspid regurgitation.  6.  Normal pericardium.  No pericardial effusion.  Normal cardiac MRI without evidence for inflammatory cardiomyopathy or ARVC.   Electronically Signed By: Leim Moose On: 03/28/2018  19:07   ______________________________________________________________________________________________     Recent Labs: 11/17/2023: ALT 16; BUN 27; Creatinine 1.00; Hemoglobin 12.6; Platelet Count 251; Potassium 4.9; Sodium 139; TSH 2.580  Recent Lipid Panel    Component Value Date/Time   CHOL 178 01/18/2023 1152   TRIG 179 (H) 01/18/2023 1152   HDL 51 01/18/2023 1152   CHOLHDL 3.5 01/18/2023 1152   CHOLHDL 4.6 02/06/2013 0806   VLDL 33 02/06/2013 0806   LDLCALC 96 01/18/2023 1152    History of Present Illness   83 year old female with a history of mild coronary artery calcification, NSVT, PSVT, PVCs, mitral valve prolapse with mild mitral valve regurgitation, hypertension, hyperlipidemia, hyperparathyroidism, and GERD who presents for follow-up related to palpitations.  She has a history of mitral valve prolapse with mild mitral valve regurgitation.  She was referred to cardiology by her PCP in the setting of palpitations.  Previously followed by Dr. Hobart and Dr. Alveta.  She was seen in the office in 01/2024 and reported dyspnea on exertion, increased palpitations, generalized weakness, fatigue.  Echocardiogram in 12/2023 showed EF 55 to 60%, normal LV function, no RWMA, normal RV systolic function, mild mitral valve regurgitation. Cardiac monitor in 12/2023 showed predominant normal sinus rhythm, 14 episodes of nonsustained SVT, 18 episodes of nonsustained VT, rare PACs and PVCs, no evidence of atrial fibrillation.  She was referred to EP.  She was last seen in the office on 01/13/2024 and reported ongoing palpitations.  Metoprolol  was increased to 25 mg twice daily.  Cardiac PET stress test in 01/2024 showed fixed  anteroseptal perfusion defect with normal wall motion, consistent with artifact, no evidence of ischemia or infarction, mild coronary artery calcification, EF 63%.  She was started on flecainide. This medication was ultimately discontinued due to worsening palpitations.  She  presents today for follow-up.  Since her last visit He continues to report intermittent palpitations.  Shortness of breath with significant exertion.  Fatigue.  Headaches.  She walks 2 miles a day.  She eats breakfast, takes her medication, and walks for an hour.  She does note some labile BP, particularly after walking.  Question if this is not the result of her medication kicking in.  Encouraged her to take her medication upon returning from her walk, continue to monitor symptoms, BP.  No significant symptoms with BP.  Lowest BP SBP 95 mmHg.  Low 110s.  Given ongoing symptoms, will repeat 14-day ZIO monitor.  Did not tolerate flecainide, transition to metoprolol .  Follow-up in January 2026 with Dr. Jeffrie.  Recent ischemic evaluation, echocardiogram reassuring.  Occasional elevated heart rate with activity.  Occasional lightheadedness.  Reviewed vagal maneuvers, ED precautions.  Palpitations/PSVT/NSVT/PVCs:  Coronary calcification: Mitral valve regurgitation/mitral valve prolapse: Hypertension: Hyperlipidemia: Disposition:  Home Medications    Current Outpatient Medications  Medication Sig Dispense Refill   ALPRAZolam  (XANAX ) 0.25 MG tablet Take one tab po at bedtime prn 30 tablet 0   baclofen  (LIORESAL ) 10 MG tablet Take 0.5-1 tablets (5-10 mg total) by mouth 3 (three) times daily as needed for muscle spasms. 30 each 0   BOTOX 100 units SOLR injection Inject 100 Units into the muscle.     ciprofloxacin  (CIPRO ) 500 MG tablet Take 1 tablet (500 mg total) by mouth 2 (two) times daily. 14 tablet 0   dicyclomine  (BENTYL ) 20 MG tablet Take 1 tablet (20 mg total) by mouth every 6 (six) hours as needed (IBS). 60 tablet 1   diphenoxylate -atropine  (LOMOTIL ) 2.5-0.025 MG tablet Take 1 tablet by mouth 4 (four) times daily as needed for diarrhea or loose stools. 30 tablet 0   fexofenadine (ALLEGRA) 180 MG tablet Take 180 mg by mouth daily as needed for allergies.     meclizine  (ANTIVERT ) 25 MG tablet Take  1 tablet (25 mg total) by mouth 3 (three) times daily as needed for dizziness. 30 tablet 0   metoprolol  succinate (TOPROL  XL) 25 MG 24 hr tablet Take 1 tablet (25 mg total) by mouth 2 (two) times daily. 180 tablet 1   metroNIDAZOLE  (FLAGYL ) 500 MG tablet Take 1 tablet (500 mg total) by mouth 3 (three) times daily. 21 tablet 0   Multiple Vitamins-Minerals (MULTIVITAMIN GUMMIES ADULT) CHEW Chew 1 tablet by mouth daily.     pantoprazole  (PROTONIX ) 40 MG tablet Take 1 tablet (40 mg total) by mouth daily before breakfast. 30 tablet 11   Polyvinyl Alcohol-Povidone PF 1.4-0.6 % SOLN Place 1 drop into both eyes 3 (three) times daily.     Probiotic Product (ALIGN PO) Take 1 capsule by mouth daily.     rosuvastatin  (CRESTOR ) 5 MG tablet Take 1 tablet by mouth on Mon, Wed and Fri only. 36 tablet 3   triamcinolone  cream (KENALOG ) 0.1 % Apply 1 Application topically 2 (two) times daily as needed. 30 g 0   No current facility-administered medications for this visit.     Review of Systems    ***.  All other systems reviewed and are otherwise negative except as noted above.    Physical Exam    VS:  There were no vitals taken  for this visit. , BMI There is no height or weight on file to calculate BMI.     GEN: Well nourished, well developed, in no acute distress. HEENT: normal. Neck: Supple, no JVD, carotid bruits, or masses. Cardiac: RRR, no murmurs, rubs, or gallops. No clubbing, cyanosis, edema.  Radials/DP/PT 2+ and equal bilaterally.  Respiratory:  Respirations regular and unlabored, clear to auscultation bilaterally. GI: Soft, nontender, nondistended, BS + x 4. MS: no deformity or atrophy. Skin: warm and dry, no rash. Neuro:  Strength and sensation are intact. Psych: Normal affect.  Accessory Clinical Findings    ECG personally reviewed by me today -    - no acute changes.   Lab Results  Component Value Date   WBC 9.3 11/17/2023   HGB 12.6 11/17/2023   HCT 38.5 11/17/2023   MCV 93.2  11/17/2023   PLT 251 11/17/2023   Lab Results  Component Value Date   CREATININE 1.00 11/17/2023   BUN 27 (H) 11/17/2023   NA 139 11/17/2023   K 4.9 11/17/2023   CL 103 11/17/2023   CO2 25 11/17/2023   Lab Results  Component Value Date   ALT 16 11/17/2023   AST 22 11/17/2023   ALKPHOS 66 11/17/2023   BILITOT 0.2 11/17/2023   Lab Results  Component Value Date   CHOL 178 01/18/2023   HDL 51 01/18/2023   LDLCALC 96 01/18/2023   TRIG 179 (H) 01/18/2023   CHOLHDL 3.5 01/18/2023    Lab Results  Component Value Date   HGBA1C 5.9 (H) 08/24/2023    Assessment & Plan    1.  ***  No BP recorded.  {Refresh Note OR Click here to enter BP  :1}***   Damien JAYSON Braver, NP 03/19/2024, 11:00 AM

## 2024-03-19 NOTE — Patient Instructions (Addendum)
 Medication Instructions:  Your physician recommends that you continue on your current medications as directed. Please refer to the Current Medication list given to you today.   *If you need a refill on your cardiac medications before your next appointment, please call your pharmacy*  Lab Work: NONE ordered at this time of appointment    Testing/Procedures: 14 day zio  monitor   Follow-Up: At [facility] you and your health needs are our priority.  As part of our continuing mission to provide you with exceptional heart care, our providers are all part of one team.  This team includes your primary Cardiologist (physician) and Advanced Practice Providers or APPs (Physician Assistants and Nurse Practitioners) who all work together to provide you with the care you need, when you need it.  Your next appointment:    January 2026 Dr. Jeffrie  We recommend signing up for the patient portal called MyChart.  Sign up information is provided on this After Visit Summary.  MyChart is used to connect with patients for Virtual Visits (Telemedicine).  Patients are able to view lab/test results, encounter notes, upcoming appointments, etc.  Non-urgent messages can be sent to your provider as well.   To learn more about what you can do with MyChart, go to [email or web address].   Other Instructions  ZIO XT- Long Term Monitor Instructions  Your physician has requested you wear a ZIO patch monitor for 14 days.  This is a single patch monitor. [employer] supplies one patch monitor per enrollment. Additional stickers are not available. Please do not apply patch if you will be having a Nuclear Stress Test,  Echocardiogram, Cardiac CT, MRI, or Chest Xray during the period you would be wearing the  monitor. The patch cannot be worn during these tests. You cannot remove and re-apply the  ZIO XT patch monitor.  Your ZIO patch monitor will be mailed 3 day USPS to your address on file. It may take 3-5 days   to receive your monitor after you have been enrolled.  Once you have received your monitor, please review the enclosed instructions. Your monitor  has already been registered assigning a specific monitor serial # to you.  Billing and Patient Assistance Program Information  We have supplied [employer] with any of your insurance information on file for billing purposes. [employer] offers a sliding scale Patient Assistance Program for patients that do not have  insurance, or whose insurance does not completely cover the cost of the ZIO monitor.  You must apply for the Patient Assistance Program to qualify for this discounted rate.  To apply, please call [employer] at [phone], select option 4, select option 2, ask to apply for  Patient Assistance Program. Meredeth will ask your household income, and how many people  are in your household. They will quote your out-of-pocket cost based on that information.  [employer] will also be able to set up a 58-month, interest-free payment plan if needed.  Applying the monitor   Shave hair from upper left chest.  Hold abrader disc by orange tab. Rub abrader in 40 strokes over the upper left chest as  indicated in your monitor instructions.  Clean area with 4 enclosed alcohol pads. Let dry.  Apply patch as indicated in monitor instructions. Patch will be placed under collarbone on left  side of chest with arrow pointing upward.  Rub patch adhesive wings for 2 minutes. Remove white label marked 1. Remove the white  label marked 2. Rub patch adhesive wings for 2  additional minutes.  While looking in a mirror, press and release button in center of patch. A small green light will  flash 3-4 times. This will be your only indicator that the monitor has been turned on.  Do not shower for the first 24 hours. You may shower after the first 24 hours.  Press the button if you feel a symptom. You will hear a small click. Record Date, Time and  Symptom in the Patient  Logbook.  When you are ready to remove the patch, follow instructions on the last 2 pages of Patient  Logbook. Stick patch monitor onto the last page of Patient Logbook.  Place Patient Logbook in the blue and white box. Use locking tab on box and tape box closed  securely. The blue and white box has prepaid postage on it. Please place it in the mailbox as  soon as possible. Your physician should have your test results approximately 7 days after the  monitor has been mailed back to [facility].  Call [facility] Customer Care at [phone] if you have questions regarding  your ZIO XT patch monitor. Call them immediately if you see an orange light blinking on your  monitor.  If your monitor falls off in less than 4 days, contact our Monitor department at [phone].  If your monitor becomes loose or falls off after 4 days call [employer] at [phone] for  suggestions on securing your monitor

## 2024-03-19 NOTE — Progress Notes (Unsigned)
 Enrolled for [employer] to mail a ZIO XT long term holter monitor to the patients address on file.   DOD Dr. [place] to read.

## 2024-03-20 ENCOUNTER — Encounter: Payer: Self-pay | Admitting: Nurse Practitioner

## 2024-03-23 ENCOUNTER — Encounter: Payer: Self-pay | Admitting: Nurse Practitioner

## 2024-03-23 ENCOUNTER — Ambulatory Visit: Admitting: Nurse Practitioner

## 2024-03-23 VITALS — BP 143/72 | HR 73 | Ht 62.0 in | Wt 134.5 lb

## 2024-03-23 DIAGNOSIS — E782 Mixed hyperlipidemia: Secondary | ICD-10-CM

## 2024-03-23 DIAGNOSIS — R7309 Other abnormal glucose: Secondary | ICD-10-CM | POA: Diagnosis not present

## 2024-03-23 DIAGNOSIS — R1011 Right upper quadrant pain: Secondary | ICD-10-CM | POA: Diagnosis not present

## 2024-03-23 DIAGNOSIS — M5134 Other intervertebral disc degeneration, thoracic region: Secondary | ICD-10-CM | POA: Diagnosis not present

## 2024-03-23 DIAGNOSIS — K5792 Diverticulitis of intestine, part unspecified, without perforation or abscess without bleeding: Secondary | ICD-10-CM

## 2024-03-24 ENCOUNTER — Ambulatory Visit: Payer: Self-pay | Admitting: Nurse Practitioner

## 2024-03-24 ENCOUNTER — Encounter: Payer: Self-pay | Admitting: Nurse Practitioner

## 2024-03-24 DIAGNOSIS — M5134 Other intervertebral disc degeneration, thoracic region: Secondary | ICD-10-CM | POA: Insufficient documentation

## 2024-03-24 DIAGNOSIS — K5792 Diverticulitis of intestine, part unspecified, without perforation or abscess without bleeding: Secondary | ICD-10-CM | POA: Insufficient documentation

## 2024-03-24 LAB — HEMOGLOBIN A1C
Est. average glucose Bld gHb Est-mCnc: 114 mg/dL
Hgb A1c MFr Bld: 5.6 % (ref 4.8–5.6)

## 2024-03-24 LAB — LIPASE: Lipase: 34 U/L (ref 14–85)

## 2024-03-24 LAB — COMPREHENSIVE METABOLIC PANEL WITH GFR
ALT: 20 IU/L (ref 0–32)
AST: 26 IU/L (ref 0–40)
Albumin: 4.2 g/dL (ref 3.7–4.7)
Alkaline Phosphatase: 60 IU/L (ref 48–129)
BUN/Creatinine Ratio: 22 (ref 12–28)
BUN: 22 mg/dL (ref 8–27)
Bilirubin Total: <0.2 mg/dL (ref 0.0–1.2)
CO2: 24 mmol/L (ref 20–29)
Calcium: 9.6 mg/dL (ref 8.7–10.3)
Chloride: 102 mmol/L (ref 96–106)
Creatinine, Ser: 1.02 mg/dL — ABNORMAL HIGH (ref 0.57–1.00)
Globulin, Total: 1.8 g/dL (ref 1.5–4.5)
Glucose: 93 mg/dL (ref 70–99)
Potassium: 4.4 mmol/L (ref 3.5–5.2)
Sodium: 139 mmol/L (ref 134–144)
Total Protein: 6 g/dL (ref 6.0–8.5)
eGFR: 55 mL/min/1.73 — ABNORMAL LOW (ref >59)

## 2024-03-24 LAB — LIPID PANEL
Chol/HDL Ratio: 2.9 ratio (ref 0.0–4.4)
Cholesterol, Total: 147 mg/dL (ref 100–199)
HDL: 50 mg/dL (ref >39)
LDL Chol Calc (NIH): 69 mg/dL (ref 0–99)
Triglycerides: 167 mg/dL — ABNORMAL HIGH (ref 0–149)
VLDL Cholesterol Cal: 28 mg/dL (ref 5–40)

## 2024-03-24 MED ORDER — METRONIDAZOLE 500 MG PO TABS: 500.0000 mg | ORAL_TABLET | Freq: Three times a day (TID) | ORAL | 0 refills | Status: DC

## 2024-03-24 MED ORDER — CIPROFLOXACIN HCL 500 MG PO TABS: 500.0000 mg | ORAL_TABLET | Freq: Two times a day (BID) | ORAL | 0 refills | Status: DC

## 2024-03-24 NOTE — Progress Notes (Signed)
 Subjective:    Patient ID: [name] [name], female    DOB: 1940/06/20, 83 y.o.   MRN: [id number]  Abdominal Pain Associated symptoms include diarrhea and nausea. Pertinent negatives include no constipation, fever or vomiting.   Discussed the use of AI scribe software for clinical note transcription with the patient, who gave verbal consent to proceed.  History of Present Illness [name] [name] is an 83 year old female with diverticulitis and IBS who presents with recent episodes of diverticulitis and gastrointestinal symptoms.  She experienced a recent episode of diverticulitis, for which she visited urgent care last weekend. She was treated with antibiotics, which alleviated her pain within two days, though she experienced nausea and a metallic taste from the medication. She has had diverticulitis episodes in the past, with the last one occurring years ago. She suspects that eating butter pecan ice cream, which was loaded with pecans, may have triggered the recent episode.  She describes ongoing gastrointestinal symptoms, including mustard yellow urine and loose, yellow stools for the past several weeks. She also reports pain under her rib cage at night, which prevents her from lying on that side. The pain sometimes radiates across her abdomen. Some RUQ discomfort at times. She is concerned about potential liver or pancreatic issues. She has a history of IBS with diarrhea and had a colonoscopy a few years ago, which revealed one polyp. No fever, vomiting, or bloody stools. She denies acid reflux, heartburn, nausea, or vomiting. Occasionally experiences shortness of breath with certain activities.  She experiences upper back pain, sometimes on the left side and sometimes on the right, which can be excruciating. This pain occurs periodically and lasts about three to four days. She has been told it might be muscle spasms and has been prescribed a muscle relaxer, which she has not taken. Instead,  she uses a heating pad for relief which works well. She has a history of sciatic problems. Standing for long periods, such as cooking, exacerbates her back pain.  She has a history of post-herpetic neuralgia following shingles two years ago, which causes persistent pain in certain areas but not the ones she mentions today. She takes Tylenol  and uses a heating pad for pain management. She also takes a daily probiotic and eats yogurt to manage her gastrointestinal symptoms.  She gets regular CT of chest, abdomen and pelvis for surveillance due to history of non Hodgkins lymphoma. Her next one is scheduled on 05/22/24.   Review of Systems  Constitutional:  Positive for fatigue. Negative for fever.  Respiratory:  Negative for cough, chest tightness and shortness of breath.   Cardiovascular:  Negative for chest pain.  Gastrointestinal:  Positive for abdominal pain, diarrhea and nausea. Negative for blood in stool, constipation and vomiting.  Musculoskeletal:  Positive for back pain.      03/23/2024   11:07 AM  Depression screen PHQ 2/9  Decreased Interest 0  Down, Depressed, Hopeless 0  PHQ - 2 Score 0   Social History   Tobacco Use   Smoking status: Former    Current packs/day: 0.33    Types: Cigarettes    Passive exposure: Never   Smokeless tobacco: Never   Tobacco comments:    quit 1976  Vaping Use   Vaping status: Never Used  Substance Use Topics   Alcohol use: Not Currently    Comment: very rare   Drug use: No        Objective:   Physical Exam Vitals and nursing  note reviewed.  Constitutional:      General: She is not in acute distress. Cardiovascular:     Rate and Rhythm: Normal rate and regular rhythm.  Pulmonary:     Effort: Pulmonary effort is normal.     Breath sounds: Normal breath sounds.  Abdominal:     General: Bowel sounds are normal. There is distension.     Palpations: Abdomen is soft. There is no mass.     Tenderness: There is no abdominal tenderness.  There is no guarding or rebound.     Comments: Abdomen mildly distended and soft. No obvious masses or organomegaly.   Skin:    General: Skin is warm and dry.  Neurological:     Mental Status: She is alert and oriented to person, place, and time.     Comments: No rash on the back. No tenderness with palpation at this time.   Psychiatric:        Mood and Affect: Mood normal.        Behavior: Behavior normal.    Today's Vitals   03/23/24 1104  BP: (!) 143/72  Pulse: 73  SpO2: 100%  Weight: 134 lb 8 oz (61 kg)  Height: 5' 2 (1.575 m)   Body mass index is 24.6 kg/m.  EXAM: 3 VIEW(S) XRAY OF THE THORACIC SPINE 02/21/2024 11:36:00 AM   COMPARISON: 01/18/2023   CLINICAL HISTORY: Upper back pain. Chronic mid back pain.   FINDINGS:   BONES: Subtle dextrocurvature in the midthoracic spine. Additional mild kyphotic curvature noted in the upper lumbar spine. Thoracic vertebral body heights are maintained without radiographic evidence of compression fracture. No acute fracture. No aggressive appearing osseous lesion.   DISCS AND DEGENERATIVE CHANGES: Degenerative endplate osteophytes at multiple levels. Mild disc space narrowing at multiple levels.   SOFT TISSUES: Right upper quadrant surgical clips. The visualized lungs are clear.   IMPRESSION: 1. No acute abnormality of the thoracic spine. 2. Similar mild degenerative changes.        Assessment & Plan:  1. Diverticulitis (Primary) Recent episode resolved with antibiotics. - Ordered metabolic profile and lipase. - Ordered A1c. - Hold Flagyl  and Cipro  for future use. - Discuss colonoscopy with GI specialist. - Comprehensive metabolic panel with GFR - ciprofloxacin  (CIPRO ) 500 MG tablet; Take 1 tablet (500 mg total) by mouth 2 (two) times daily.  Dispense: 14 tablet; Refill: 0 - metroNIDAZOLE  (FLAGYL ) 500 MG tablet; Take 1 tablet (500 mg total) by mouth 3 (three) times daily.  Dispense: 21 tablet; Refill: 0  2.  Mixed hyperlipidemia  - Lipid panel  3. Elevated glucose  - Hemoglobin A1c  4. RUQ abdominal pain  - Comprehensive metabolic panel with GFR - Lipase  5. DDD (degenerative disc disease), thoracic Intermittent upper back pain due to degenerative changes and possible nerve involvement. - Continue heating pad and Tylenol . - Consider lidocaine  patches or gel. - Monitor and report symptom changes.  Return if symptoms worsen or fail to improve.

## 2024-04-13 DIAGNOSIS — R002 Palpitations: Secondary | ICD-10-CM | POA: Diagnosis not present

## 2024-04-13 DIAGNOSIS — I4729 Other ventricular tachycardia: Secondary | ICD-10-CM | POA: Diagnosis not present

## 2024-04-19 ENCOUNTER — Telehealth: Payer: Self-pay | Admitting: Nurse Practitioner

## 2024-04-19 NOTE — Telephone Encounter (Signed)
 Spoke with pt and let her know that her heart monitor results have not been reviewed by the provider yet and that our office will reach out once they are available. Pt verbalized understanding. All questions if any were answered.

## 2024-04-19 NOTE — Telephone Encounter (Signed)
 Pt regarding results from heart monitor. Please advise.

## 2024-04-23 DIAGNOSIS — I4729 Other ventricular tachycardia: Secondary | ICD-10-CM | POA: Diagnosis not present

## 2024-04-23 DIAGNOSIS — R002 Palpitations: Secondary | ICD-10-CM

## 2024-04-23 NOTE — Telephone Encounter (Signed)
 This only came to my in basket on 04/17/2024.  Thank you for the heads up.  I have read the monitor.  This does not show any new findings different from what previous monitor had showed.  She still has episodes of SVT/atrial tachycardia and ventricular tachycardia.  I defer further management to [facility] mom and Dr. Kennyth who have been actively managing the same issues with the patient.  Thanks MJP

## 2024-04-24 ENCOUNTER — Ambulatory Visit: Admitting: Family Medicine

## 2024-04-24 VITALS — BP 133/73 | HR 69 | Temp 97.7°F | Ht 62.0 in | Wt 134.5 lb

## 2024-04-24 DIAGNOSIS — I10 Essential (primary) hypertension: Secondary | ICD-10-CM

## 2024-04-24 MED ORDER — MECLIZINE HCL 25 MG PO TABS: 25.0000 mg | ORAL_TABLET | Freq: Three times a day (TID) | ORAL | 0 refills | Status: AC | PRN

## 2024-04-24 NOTE — Patient Instructions (Signed)
 Medication as needed.  No need to worry about the BP.  Take care  Dr. Bluford

## 2024-04-25 ENCOUNTER — Encounter: Payer: Self-pay | Admitting: Nurse Practitioner

## 2024-04-30 ENCOUNTER — Ambulatory Visit: Payer: Self-pay | Admitting: Nurse Practitioner

## 2024-04-30 DIAGNOSIS — I10 Essential (primary) hypertension: Secondary | ICD-10-CM | POA: Insufficient documentation

## 2024-04-30 NOTE — Assessment & Plan Note (Signed)
 Average of home readings appears to be at goal. Continue Metoprolol .

## 2024-04-30 NOTE — Progress Notes (Signed)
 "  Subjective:  Patient ID: [name] [name], female    DOB: 1940-08-06  Age: 83 y.o. MRN: [id number]  CC:   Chief Complaint  Patient presents with   Hypertension    Patient is here for having blood pressure concern. Numbers are ranging everwhere    HPI:  83 year old female presents for evaluation of the above.  BP quite well controlled here today. She reports intermittently elevated readings at home. BP lowest after exercise. She is concerned about this and would like to discuss today. Compliant with Metoprolol . No recent hypotension.  Requesting refill on Meclizine .  Patient Active Problem List   Diagnosis Date Noted   Hypertension 04/30/2024   DDD (degenerative disc disease), thoracic 03/24/2024   Lower extremity edema 02/21/2024   Ventricular tachycardia (HCC) 12/22/2023   Sleep disturbance 11/07/2023   Hyperparathyroidism, primary 12/17/2021   Age-related osteoporosis without current pathological fracture 12/17/2021   Peripheral neuropathy 08/26/2016   Dyssynergic defecation 12/31/2015   NSVT (nonsustained ventricular tachycardia) (HCC) 11/27/2013   Allergic rhinitis 08/20/2013   Hyperlipidemia 08/27/2008   MVP (mitral valve prolapse) 08/27/2008    IBS- diarrhea predominant 10/13/2007   NHL (non-Hodgkin's lymphoma) (HCC) 06/16/2007    Social Hx   Social History   Socioeconomic History   Marital status: Widowed    Spouse name: Not on file   Number of children: 1   Years of education: Not on file   Highest education level: 12th grade  Occupational History   Occupation: Warehouse Manager work part time    Associate Professor: RETIRED  Tobacco Use   Smoking status: Former    Current packs/day: 0.33    Types: Cigarettes    Passive exposure: Never   Smokeless tobacco: Never   Tobacco comments:    quit 1976  Vaping Use   Vaping status: Never Used  Substance and Sexual Activity   Alcohol use: Not Currently    Comment: very rare   Drug use: No   Sexual activity: Yes    Birth  control/protection: Surgical  Other Topics Concern   Not on file  Social History Narrative   Retired, widowed, 1 child   Rare EtOH, former smoker none now   No drugs   Right handed   Social Drivers of Health   Tobacco Use: Medium Risk (03/24/2024)   Patient History    Smoking Tobacco Use: Former    Smokeless Tobacco Use: Never    Passive Exposure: Never  Physicist, Medical Strain: Low Risk (10/31/2023)   Overall Financial Resource Strain (CARDIA)    Difficulty of Paying Living Expenses: Not hard at all  Food Insecurity: No Food Insecurity (10/31/2023)   Epic    Worried About Programme Researcher, Broadcasting/film/video in the Last Year: Never true    Ran Out of Food in the Last Year: Never true  Transportation Needs: No Transportation Needs (10/31/2023)   Epic    Lack of Transportation (Medical): No    Lack of Transportation (Non-Medical): No  Physical Activity: Sufficiently Active (10/31/2023)   Exercise Vital Sign    Days of Exercise per Week: 5 days    Minutes of Exercise per Session: 60 min  Stress: No Stress Concern Present (10/31/2023)   [employer] of Occupational Health - Occupational Stress Questionnaire    Feeling of Stress: Not at all  Social Connections: Moderately Isolated (10/31/2023)   Social Connection and Isolation Panel    Frequency of Communication with Friends and Family: More than three times a week  Frequency of Social Gatherings with Friends and Family: Three times a week    Attends Religious Services: More than 4 times per year    Active Member of Clubs or Organizations: No    Attends Banker Meetings: Not on file    Marital Status: Widowed  Depression (PHQ2-9): Low Risk (04/24/2024)   Depression (PHQ2-9)    PHQ-2 Score: 0  Alcohol Screen: Not on file  Housing: Unknown (10/31/2023)   Epic    Unable to Pay for Housing in the Last Year: No    Number of Times Moved in the Last Year: Not on file    Homeless in the Last Year: No  Utilities: Not on file   Health Literacy: Not on file    Review of Systems Per HPI  Objective:  BP 133/73 (BP Location: Left Arm, Patient Position: Sitting)   Pulse 69   Temp 97.7 F (36.5 C)   Ht 5' 2 (1.575 m)   Wt 134 lb 8 oz (61 kg)   SpO2 95%   BMI 24.60 kg/m      04/24/2024    2:39 PM 03/23/2024   11:04 AM 03/19/2024    3:15 PM  BP/Weight  Systolic BP 133 143 134  Diastolic BP 73 72 70  Wt. (Lbs) 134.5 134.5 134.8  BMI 24.6 kg/m2 24.6 kg/m2 24.66 kg/m2    Physical Exam Vitals and nursing note reviewed.  Constitutional:      General: She is not in acute distress.    Appearance: Normal appearance.  HENT:     Head: Normocephalic and atraumatic.  Cardiovascular:     Rate and Rhythm: Normal rate and regular rhythm.  Pulmonary:     Effort: Pulmonary effort is normal.     Breath sounds: Normal breath sounds.  Neurological:     Mental Status: She is alert.  Psychiatric:        Mood and Affect: Mood normal.     Lab Results  Component Value Date   WBC 9.3 11/17/2023   HGB 12.6 11/17/2023   HCT 38.5 11/17/2023   PLT 251 11/17/2023   GLUCOSE 93 03/23/2024   CHOL 147 03/23/2024   TRIG 167 (H) 03/23/2024   HDL 50 03/23/2024   LDLCALC 69 03/23/2024   ALT 20 03/23/2024   AST 26 03/23/2024   NA 139 03/23/2024   K 4.4 03/23/2024   CL 102 03/23/2024   CREATININE 1.02 (H) 03/23/2024   BUN 22 03/23/2024   CO2 24 03/23/2024   TSH 2.580 11/17/2023   INR 0.94 06/16/2018   HGBA1C 5.6 03/23/2024     Assessment & Plan:  Primary hypertension Assessment & Plan: Average of home readings appears to be at goal. Continue Metoprolol .   Other orders -     Meclizine  HCl; Take 1 tablet (25 mg total) by mouth 3 (three) times daily as needed for dizziness.  Dispense: 30 tablet; Refill: 0    Follow-up:  As previously scheduled.  Jacqulyn Ahle DO [facility] "

## 2024-05-11 ENCOUNTER — Other Ambulatory Visit: Payer: Self-pay | Admitting: Family Medicine

## 2024-05-21 ENCOUNTER — Inpatient Hospital Stay: Attending: Oncology

## 2024-05-21 DIAGNOSIS — C821A Follicular lymphoma grade II, in remission: Secondary | ICD-10-CM | POA: Diagnosis present

## 2024-05-21 DIAGNOSIS — C8203 Follicular lymphoma grade I, intra-abdominal lymph nodes: Secondary | ICD-10-CM

## 2024-05-21 LAB — CBC WITH DIFFERENTIAL (CANCER CENTER ONLY)
Abs Immature Granulocytes: 0.04 K/uL (ref 0.00–0.07)
Basophils Absolute: 0.1 K/uL (ref 0.0–0.1)
Basophils Relative: 1 %
Eosinophils Absolute: 0.2 K/uL (ref 0.0–0.5)
Eosinophils Relative: 2 %
HCT: 41.2 % (ref 36.0–46.0)
Hemoglobin: 13.5 g/dL (ref 12.0–15.0)
Immature Granulocytes: 0 %
Lymphocytes Relative: 19 %
Lymphs Abs: 1.7 K/uL (ref 0.7–4.0)
MCH: 29.3 pg (ref 26.0–34.0)
MCHC: 32.8 g/dL (ref 30.0–36.0)
MCV: 89.6 fL (ref 80.0–100.0)
Monocytes Absolute: 1.2 K/uL — ABNORMAL HIGH (ref 0.1–1.0)
Monocytes Relative: 14 %
Neutro Abs: 5.8 K/uL (ref 1.7–7.7)
Neutrophils Relative %: 64 %
Platelet Count: 276 K/uL (ref 150–400)
RBC: 4.6 MIL/uL (ref 3.87–5.11)
RDW: 12.9 % (ref 11.5–15.5)
WBC Count: 9.1 K/uL (ref 4.0–10.5)
nRBC: 0 % (ref 0.0–0.2)

## 2024-05-21 LAB — CMP (CANCER CENTER ONLY)
ALT: 18 U/L (ref 0–44)
AST: 21 U/L (ref 15–41)
Albumin: 4.5 g/dL (ref 3.5–5.0)
Alkaline Phosphatase: 77 U/L (ref 38–126)
Anion gap: 12 (ref 5–15)
BUN: 18 mg/dL (ref 8–23)
CO2: 26 mmol/L (ref 22–32)
Calcium: 10.4 mg/dL — ABNORMAL HIGH (ref 8.9–10.3)
Chloride: 103 mmol/L (ref 98–111)
Creatinine: 0.94 mg/dL (ref 0.44–1.00)
GFR, Estimated: >60 mL/min
Glucose, Bld: 97 mg/dL (ref 70–99)
Potassium: 4 mmol/L (ref 3.5–5.1)
Sodium: 140 mmol/L (ref 135–145)
Total Bilirubin: 0.2 mg/dL (ref 0.0–1.2)
Total Protein: 6.8 g/dL (ref 6.5–8.1)

## 2024-05-22 ENCOUNTER — Ambulatory Visit (HOSPITAL_BASED_OUTPATIENT_CLINIC_OR_DEPARTMENT_OTHER)
Admission: RE | Admit: 2024-05-22 | Discharge: 2024-05-22 | Disposition: A | Discharge status: Home / Self Care | Source: Ambulatory Visit | Attending: Oncology | Admitting: Oncology

## 2024-05-22 DIAGNOSIS — C8203 Follicular lymphoma grade I, intra-abdominal lymph nodes: Secondary | ICD-10-CM | POA: Insufficient documentation

## 2024-05-22 MED ORDER — METOPROLOL SUCCINATE ER 50 MG PO TB24: 50.0000 mg | ORAL_TABLET | Freq: Two times a day (BID) | ORAL | 3 refills | Status: AC

## 2024-05-22 MED ORDER — IOHEXOL 300 MG/ML  SOLN
100.0000 mL | Freq: Once | INTRAMUSCULAR | Status: AC | PRN
  Administered 2024-05-22: 100 mL via INTRAVENOUS

## 2024-05-22 NOTE — Telephone Encounter (Signed)
 [name] Damien BROCKS, NP to [name] [name], [facility]    04/30/24  3:11 PM Result Note Recent cardiac monitor showed predominantly normal sinus rhythm, there were 16 episodes of fast heartbeat originating from the top chamber of the heart (SVT), 18 episodes of fast heartbeat originating from the bottom chambers of heart (VT), rare early beats from the top and bottom chambers of the heart.  Would recommend increasing metoprolol  to 50 mg twice daily.  Follow-up as scheduled with Dr. Jeffrie.  Thank you-EM  RX for Metoprolol  Succinate 50 mg BID sent to pt's pharmacy.

## 2024-05-29 ENCOUNTER — Telehealth: Payer: Self-pay | Admitting: Oncology

## 2024-05-29 ENCOUNTER — Ambulatory Visit: Admitting: Oncology

## 2024-05-29 NOTE — Telephone Encounter (Signed)
 PT called to reschedule appt due to icy roads. Updated day and time confirmed.

## 2024-05-30 ENCOUNTER — Inpatient Hospital Stay: Admitting: Oncology

## 2024-06-14 ENCOUNTER — Inpatient Hospital Stay: Admitting: Oncology

## 2024-06-15 ENCOUNTER — Ambulatory Visit: Admitting: Cardiology
